# Patient Record
Sex: Female | Born: 1950
Health system: Southern US, Community
[De-identification: ages and names within clinical notes are randomized; demographics above are authoritative.]

## PROBLEM LIST (undated history)

## (undated) DIAGNOSIS — R51 Headache: Secondary | ICD-10-CM

## (undated) DIAGNOSIS — G4730 Sleep apnea, unspecified: Secondary | ICD-10-CM

## (undated) DIAGNOSIS — E049 Nontoxic goiter, unspecified: Secondary | ICD-10-CM

## (undated) DIAGNOSIS — M1990 Unspecified osteoarthritis, unspecified site: Secondary | ICD-10-CM

## (undated) DIAGNOSIS — F32A Depression, unspecified: Secondary | ICD-10-CM

## (undated) DIAGNOSIS — F329 Major depressive disorder, single episode, unspecified: Secondary | ICD-10-CM

## (undated) HISTORY — PX: THYROIDECTOMY: SHX17

## (undated) HISTORY — DX: Depression, unspecified: F32.A

## (undated) HISTORY — DX: Unspecified osteoarthritis, unspecified site: M19.90

## (undated) HISTORY — PX: ABDOMINAL HYSTERECTOMY: SHX81

## (undated) HISTORY — DX: Headache: R51

## (undated) HISTORY — DX: Sleep apnea, unspecified: G47.30

## (undated) HISTORY — DX: Major depressive disorder, single episode, unspecified: F32.9

## (undated) HISTORY — DX: Nontoxic goiter, unspecified: E04.9

## (undated) HISTORY — PX: KNEE SURGERY: SHX244

---

## 2002-10-23 ENCOUNTER — Encounter: Payer: Self-pay | Admitting: Family Medicine

## 2002-10-23 ENCOUNTER — Ambulatory Visit (HOSPITAL_COMMUNITY): Admission: RE | Admit: 2002-10-23 | Discharge: 2002-10-23 | Payer: Self-pay | Admitting: Family Medicine

## 2003-02-08 ENCOUNTER — Encounter: Payer: Self-pay | Admitting: Orthopaedic Surgery

## 2003-02-08 ENCOUNTER — Ambulatory Visit (HOSPITAL_COMMUNITY): Admission: RE | Admit: 2003-02-08 | Discharge: 2003-02-08 | Payer: Self-pay | Admitting: Orthopaedic Surgery

## 2003-07-09 ENCOUNTER — Encounter: Admission: RE | Admit: 2003-07-09 | Discharge: 2003-07-09 | Payer: Self-pay | Admitting: Internal Medicine

## 2005-08-05 ENCOUNTER — Emergency Department (HOSPITAL_COMMUNITY): Admission: EM | Admit: 2005-08-05 | Discharge: 2005-08-05 | Payer: Self-pay | Admitting: Emergency Medicine

## 2006-05-05 ENCOUNTER — Ambulatory Visit: Payer: Self-pay | Admitting: Internal Medicine

## 2006-06-20 ENCOUNTER — Ambulatory Visit: Payer: Self-pay | Admitting: Internal Medicine

## 2006-06-28 ENCOUNTER — Ambulatory Visit (HOSPITAL_COMMUNITY): Admission: RE | Admit: 2006-06-28 | Discharge: 2006-06-28 | Payer: Self-pay | Admitting: Internal Medicine

## 2006-10-10 ENCOUNTER — Ambulatory Visit: Payer: Self-pay | Admitting: Thoracic Surgery

## 2006-10-10 ENCOUNTER — Encounter (INDEPENDENT_AMBULATORY_CARE_PROVIDER_SITE_OTHER): Payer: Self-pay | Admitting: Specialist

## 2006-10-10 ENCOUNTER — Ambulatory Visit (HOSPITAL_COMMUNITY): Admission: RE | Admit: 2006-10-10 | Discharge: 2006-10-11 | Payer: Self-pay | Admitting: Surgery

## 2007-01-06 LAB — HM PAP SMEAR

## 2007-08-19 ENCOUNTER — Emergency Department (HOSPITAL_COMMUNITY): Admission: EM | Admit: 2007-08-19 | Discharge: 2007-08-19 | Payer: Self-pay | Admitting: Emergency Medicine

## 2008-01-06 LAB — HM MAMMOGRAPHY

## 2008-01-19 DIAGNOSIS — T7840XA Allergy, unspecified, initial encounter: Secondary | ICD-10-CM | POA: Insufficient documentation

## 2008-01-19 DIAGNOSIS — E049 Nontoxic goiter, unspecified: Secondary | ICD-10-CM | POA: Insufficient documentation

## 2008-01-19 DIAGNOSIS — J309 Allergic rhinitis, unspecified: Secondary | ICD-10-CM

## 2008-01-19 DIAGNOSIS — J45909 Unspecified asthma, uncomplicated: Secondary | ICD-10-CM | POA: Insufficient documentation

## 2008-01-22 ENCOUNTER — Ambulatory Visit: Payer: Self-pay | Admitting: Internal Medicine

## 2008-05-10 ENCOUNTER — Emergency Department (HOSPITAL_COMMUNITY): Admission: EM | Admit: 2008-05-10 | Discharge: 2008-05-10 | Payer: Self-pay | Admitting: Emergency Medicine

## 2009-06-07 LAB — HM COLONOSCOPY

## 2009-07-21 ENCOUNTER — Ambulatory Visit: Payer: Self-pay | Admitting: Internal Medicine

## 2009-07-21 DIAGNOSIS — G4700 Insomnia, unspecified: Secondary | ICD-10-CM | POA: Insufficient documentation

## 2009-08-13 ENCOUNTER — Emergency Department (HOSPITAL_COMMUNITY): Admission: EM | Admit: 2009-08-13 | Discharge: 2009-08-13 | Payer: Self-pay | Admitting: Emergency Medicine

## 2009-10-21 ENCOUNTER — Telehealth: Payer: Self-pay | Admitting: Internal Medicine

## 2009-10-23 ENCOUNTER — Ambulatory Visit: Payer: Self-pay | Admitting: Internal Medicine

## 2009-12-22 ENCOUNTER — Ambulatory Visit: Payer: Self-pay | Admitting: Internal Medicine

## 2010-04-27 ENCOUNTER — Ambulatory Visit: Payer: Self-pay | Admitting: Internal Medicine

## 2010-07-07 NOTE — Assessment & Plan Note (Signed)
Summary: 2 months/apc   Primary Provider/Referring Provider:  Esperanza Sheets [place]  CC:  2 month follow up visit-asthma and allergies; Breathing is better since last OV.Marland Kitchen  History of Present Illness:  60 yo woman with past hx allergic rhinitis and asthma. Previous surgery for large substernal goiter. Needs update of meds. Denies significant seasonal repiratory problems so far. Hot weather affects. Reviewed meds.  July 21, 2009- Asthma, Allergic rhinitis Had done well over last year or two. Starting in November noted more head congestion, some sneeze and nose blowing. Singulair continued, but she ran out. Has been off Advair and Flonase.  In the last week has had more nasal drainage. Chronic insomnia has been an issue. Sleep hygiene was reviewed.  Oct 23, 2009- Asthma, Allergic rhinitis, hx insomnia Went to ER in early April with sinus infection- not given antibiotic, told "viral". She continues Advair two times a day but doesn't feel it gets in well. Singulair does help. Using Proair partly when she smells "wood" odor. Denies mold or mildew in home. Feels she can't take deep breath, but not wheezing or chest pain, palpitation or swelling.  December 22, 2009- Asthma, Allergic rhinitis Symbicort did well and she used it regularly for about 6 weeks. She has drifted into sporadic use now.   Singulair helped in the Spring. Neti pot worked well.  She seems pleased now with her status. We distinguished benefit of meds from improved environment due to time of year.    Asthma History    Initial Asthma Severity Rating:    Age range: 12+ years    Symptoms: 0-2 days/week    Nighttime Awakenings: 0-2/month    Interferes w/ normal activity: no limitations    SABA use (not for EIB): 0-2 days/week    Asthma Severity Assessment: Intermittent   Preventive Screening-Counseling & Management  Alcohol-Tobacco     Smoking Status: never  Current Medications (verified): 1)  Synthroid 112 Mcg  Tabs  (Levothyroxine Sodium) .... Take 1 By Mouth Once Daily 2)  Singulair 10 Mg  Tabs (Montelukast Sodium) .... Take 1 By Mouth Once Daily 3)  Proair Hfa 108 (90 Base) Mcg/act  Aers (Albuterol Sulfate) .... 2 Puffs Four Times A Day As Needed 4)  Calcium Carbonate-Vitamin D 600-400 Mg-Unit  Tabs (Calcium Carbonate-Vitamin D) .... Take 1 Tablet By Mouth Two Times A Day 5)  Symbicort 80-4.5 Mcg/act Aero (Budesonide-Formoterol Fumarate) .... 2 Puffs Two Times A Day and Rinse Mouth Well After Use 6)  Meloxicam 7.5 Mg Tabs (Meloxicam) .... Take 1-2 By Mouth Once Daily As Needed 7)  Align  Caps (Probiotic Product) .... Take 1 By Mouth Once Daily 8)  Ambien 10 Mg Tabs (Zolpidem Tartrate) .... Take 1 By Mouth At Bedtime As Needed Sleep 9)  Aleve 220 Mg Tabs (Naproxen Sodium) .... Take As Directed Per Bottle As Needed 10)  Colace 100 Mg Caps (Docusate Sodium) .... Take As Directed Per Bottle As Needed 11)  Metamucil 0.52 Gm Caps (Psyllium) .... Take 6 By Mouth Once Daily To Two Times A Day  Allergies (verified): 1)  ! Septra 2)  ! 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   Physical Exam  Additional Exam:  General: A/Ox3; pleasant and cooperative, NAD, SKIN: no rash, lesions NODES: no lymphadenopathy HEENT: [place]/AT, EOM- WNL, Conjuctivae- clear, PERRLA, TM-WNL, Nose- mucus bridging, Throat- clear and wnl, raspy, Mallampati  II-III NECK: Supple w/ fair ROM, JVD- none, normal carotid impulses w/o bruits Thyroid- normal to palpation CHEST: Clear to P&A, coarse without wheeze or cough, shallow without dullness, rub. HEART: RRR, no m/g/r heard ABDOMEN: Soft and nl; ZOX:WRUE, nl pulses, no edema  NEURO: Grossly intact to observation      Impression & Recommendations:  Problem # 1:  ASTHMA (ICD-493.90) Mild intermittent now. We spent time reviewing rescue vs maintenance meds.  Problem # 2:  ALLERGIC RHINITIS (ICD-477.9)  We reviewed her experience with antihistamines. She had preferred Benadryl, but seems not to have understoo the differences. Control now is gpod. The following medications were removed from the medication list:    Fluticasone Propionate 50 Mcg/act Susp (Fluticasone propionate) .Marland Kitchen... 1-2 puffs each nostril daily    Zyrtec Hives Relief 10 Mg Tabs (Cetirizine hcl) .Marland Kitchen... Take 1 by mouth once daily  Medications Added to Medication List This Visit: 1)  Symbicort 80-4.5 Mcg/act Aero (Budesonide-formoterol fumarate) .... 2 puffs two times a day and rinse mouth well after use 2)  Ambien 10 Mg Tabs (Zolpidem tartrate) .... Take 1 by mouth at bedtime as needed sleep 3)  Aleve 220 Mg Tabs (Naproxen sodium) .... Take as directed per bottle as needed 4)  Colace 100 Mg Caps (Docusate sodium) .... Take as directed per bottle as needed 5)  Metamucil 0.52 Gm Caps (Psyllium) .... Take 6 by mouth once daily to two times a day  Other Orders: Est. Patient Level III (45409)  Patient Instructions: 1)  Please schedule a follow-up appointment in 4 months. 2)  Use the Symbicort as a maintenance  controller if you are noting some wheeze, tightness or shortness of breath frequently- like several days per week. 2 puffs, once daily, may be sufficient in stable times. 3)  Use the Proair rescue inhaler for occasional quick fix if needed- you can use it up to 4 x daily if needed. 4)  Your Neti pot or saline nasal rinse can be used if needed.

## 2010-07-07 NOTE — Assessment & Plan Note (Signed)
Summary: Brooke Schneider   Primary Provider/Referring Provider:  Esperanza Sheets [facility]  CC:  Follow up visit-allergies and asthma; "can't breathe deep" feelings. .  History of Present Illness: 01/22/08- GOITER, SUBSTERNAL (ICD-240.9) ASTHMA (ICD-493.90) ALLERGIC RHINITIS (ICD-477.9)  60 yo woman with past hx allergic rhinitis and asthma. Previous surgery for large substernal goiter. Needs update of meds. Denies significant seasonal repiratory problems so far. Hot weather affects. Reviewed meds.  July 21, 2009- Asthma, Allergic rhinitis Had done well over last year or two. Starting in November noted more head congestion, some sneeze and nose blowing. Singulair continued, but she ran out. Has been off Advair and Flonase.  In the last week has had more nasal drainage. Chronic insomnia has been an issue. Sleep hygiene was reviewed.  Oct 23, 2009- Asthma, Allergic rhinitis, hx insomnia Went to ER in early April with sinus infection- not given antibiotic, told "viral". She continues Advair two times a day but doesn't feel it gets in well. Singulair does help. Using Proair partly when she smells "wood" odor. Denies mold or mildew in home. Feels she can't take deep breath, but not wheezing or chest pain, palpitation or swelling.      Current Medications (verified): 1)  Synthroid 112 Mcg  Tabs (Levothyroxine Sodium) .... Take 1 By Mouth Once Daily 2)  Singulair 10 Mg  Tabs (Montelukast Sodium) .... Take 1 By Mouth Once Daily 3)  Proair Hfa 108 (90 Base) Mcg/act  Aers (Albuterol Sulfate) .... 2 Puffs Four Times A Day As Needed 4)  Glucosamine-Chondroitin   Caps (Glucosamine-Chondroit-Vit C-Mn) .... 3 Caps By Mouth Once Daily 5)  Calcium Carbonate-Vitamin D 600-400 Mg-Unit  Tabs (Calcium Carbonate-Vitamin D) .... Take 1 Tablet By Mouth Two Times A Day 6)  Fluticasone Propionate 50 Mcg/act  Susp (Fluticasone Propionate) .Marland Kitchen.. 1-2 Puffs Each Nostril Daily 7)  Advair Diskus 100-50 Mcg/dose  Misc  (Fluticasone-Salmeterol) .Marland Kitchen.. 1 Puff and Riinse, Twice Daily 8)  Meloxicam 7.5 Mg Tabs (Meloxicam) .... Take 1-2 By Mouth Once Daily As Needed 9)  Align  Caps (Probiotic Product) .... Take 1 By Mouth Once Daily 10)  Zyrtec Hives Relief 10 Mg Tabs (Cetirizine Hcl) .... Take 1 By Mouth Once Daily 11)  Trazodone Hcl 50 Mg Tabs (Trazodone Hcl) .... Take 1-2 By Mouth At Bedtime As Needed Sleep  Allergies (verified): 1)  ! Septra 2)  ! 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  The patient denies shortness of breath at rest, productive cough, non-productive cough, coughing up blood, chest pain, irregular heartbeats, acid heartburn, indigestion, loss of appetite, weight change, abdominal pain, difficulty swallowing, sore throat, tooth/dental problems, headaches, nasal congestion/difficulty breathing through nose, and sneezing.    Vital Signs:  Patient profile:   60 year old female Height:      66 inches Weight:      213 pounds BMI:     34.50 O2 Sat:      100 % on Room air Pulse rate:  78 / minute BP sitting:   142 / 82  (left arm) Cuff size:   large  Vitals Entered By: Reynaldo Minium CMA (Oct 23, 2009 11:04 AM)  O2 Flow:  Room air  Physical Exam  Additional Exam:  General: A/Ox3; pleasant and cooperative, NAD, SKIN: no rash, lesions NODES: no lymphadenopathy HEENT: [place]/AT, EOM- WNL, Conjuctivae- clear, PERRLA, TM-WNL,  Nose- mucus bridging, Throat- clear and wnl, raspy, Mallampati  II-III NECK: Supple w/ fair ROM, JVD- none, normal carotid impulses w/o bruits Thyroid- normal to palpation CHEST: Clear to P&A, coarse without wheeze or cough, shallow without dullness, rub. HEART: RRR, no m/g/r heard ABDOMEN: Soft and nl; XKG:YJEH, nl pulses, no edema  NEURO: Grossly intact to observation      Impression & Recommendations:  Problem # 1:  ALLERGIC RHINITIS (ICD-477.9)  I will instruct in use of Neti pot. She is going to get her house cleaned.  Her updated medication list for this problem includes:    Fluticasone Propionate 50 Mcg/act Susp (Fluticasone propionate) .Marland Kitchen... 1-2 puffs each nostril daily    Zyrtec Hives Relief 10 Mg Tabs (Cetirizine hcl) .Marland Kitchen... Take 1 by mouth once daily  Problem # 2:  ASTHMA (ICD-493.90) Not wheezing. Nonspecific dyspnea. We will get CXR and PFT. Question obesisy/ hypoventilation.  Medications Added to Medication List This Visit: 1)  Calcium Carbonate-vitamin D 600-400 Mg-unit Tabs (Calcium carbonate-vitamin d) .... Take 1 tablet by mouth two times a day 2)  Meloxicam 7.5 Mg Tabs (Meloxicam) .... Take 1-2 by mouth once daily as needed 3)  Align Caps (Probiotic product) .... Take 1 by mouth once daily 4)  Zyrtec Hives Relief 10 Mg Tabs (Cetirizine hcl) .... Take 1 by mouth once daily 5)  Trazodone Hcl 50 Mg Tabs (Trazodone hcl) .... Take 1-2 by mouth at bedtime as needed sleep  Other Orders: Est. Patient Level III (63149) T-2 View CXR (71020TC)  Patient Instructions: 1)  Please schedule a follow-up appointment in 2 months. 2)  Schedule PFT 3)  A chest x-ray has been recommended.  Your imaging study may require preauthorization.  4)  Try sample Symbicort 80/4.5, 2 puffs and rinse mouth twice daily, instead of Advair for trial. If you like it better, then call, for script.

## 2010-07-07 NOTE — Assessment & Plan Note (Signed)
Summary: f/u ///kp   Primary Provider/Referring Provider:  Esperanza Sheets [place]  CC:  , 4 month follow up, pt unable to sleep out of ambien x 1 week, and .  History of Present Illness:  Oct 23, 2009- Asthma, Allergic rhinitis, hx insomnia Went to ER in early April with sinus infection- not given antibiotic, told "viral". She continues Advair two times a day but doesn't feel it gets in well. Singulair does help. Using Proair partly when she smells "wood" odor. Denies mold or mildew in home. Feels she can't take deep breath, but not wheezing or chest pain, palpitation or swelling.  December 22, 2009- Asthma, Allergic rhinitis Symbicort did well and she used it regularly for about 6 weeks. She has drifted into sporadic use now.   Singulair helped in the Spring. Neti pot worked well.  She seems pleased now with her status. We distinguished benefit of meds from improved environment due to time of year.  April 27, 2010- Asthma, Allergic rhinitis, Insomnia Nurse-CC: ,4 month follow up, pt unable to sleep out of ambien x 1 week, Shows me tooth that came out  anticipating she may need an implant procedure.  She lost sleep- out of [place]. Menopause is aggravating her insomnia.  Lungs feel "weak, flat"- not cough or wheeze. When like this she will use her rescue inhaler more often for 2-3 days and it resolves. She has been out of Symbicort and doesn't feel she needs it.  Needs flu vax.     Asthma History    Asthma Control Assessment:    Age range: 12+ years    Symptoms: 0-2 days/week    Nighttime Awakenings: 0-2/month    Interferes w/ normal activity: no limitations    SABA use (not for EIB): 0-2 days/week    Asthma Control Assessment: Well Controlled   Preventive Screening-Counseling & Management  Alcohol-Tobacco     Smoking Status: never  Current Medications (verified): 1)  Synthroid 112 Mcg  Tabs (Levothyroxine Sodium) .... Take 1 By Mouth Once Daily 2)  Singulair 10 Mg  Tabs  (Montelukast Sodium) .... Take 1 By Mouth Once Daily 3)  Proair Hfa 108 (90 Base) Mcg/act  Aers (Albuterol Sulfate) .... 2 Puffs Four Times A Day As Needed 4)  Calcium Carbonate-Vitamin D 600-400 Mg-Unit  Tabs (Calcium Carbonate-Vitamin D) .... Take 1 Tablet By Mouth Two Times A Day 5)  Meloxicam 7.5 Mg Tabs (Meloxicam) .... Take 1-2 By Mouth Once Daily As Needed 6)  Align  Caps (Probiotic Product) .... Take 1 By Mouth Once Daily 7)  Aleve 220 Mg Tabs (Naproxen Sodium) .... Take As Directed Per Bottle As Needed 8)  Colace 100 Mg Caps (Docusate Sodium) .... Take As Directed Per Bottle As Needed 9)  Metamucil 0.52 Gm Caps (Psyllium) .... Take 6 By Mouth Once Daily To Two Times A Day  Allergies (verified): 1)  ! Septra 2)  ! 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    Vital Signs:  Patient profile:   60 year old female Height:      66 inches Weight:      210 pounds O2 Sat:      95 % on Room air Pulse rate:   70 / minute BP sitting:   138 / 88   (left arm)  Vitals Entered By: Renold Genta RCP, LPN (April 27, 2010 11:35 AM)  O2 Flow:  Room air CC: ,4 month follow up, pt unable to sleep out of ambien x 1 week, Comments Medications reviewed with patient Renold Genta RCP, LPN  April 27, 2010 11:37 AM    Physical Exam  Additional Exam:  General: A/Ox3; pleasant and cooperative, NAD, SKIN: no rash, lesions NODES: no lymphadenopathy HEENT: [place]/AT, EOM- WNL, Conjuctivae- clear, PERRLA, TM-WNL, Nose- mucus bridging, Throat- clear and wnl, raspy, Mallampati  II-III NECK: Supple w/ fair ROM, JVD- none, normal carotid impulses w/o bruits Thyroid- normal to palpation CHEST: Clear to P&A, coarse without wheeze or cough, shallow without dullness, rub. HEART: RRR, no m/g/r heard ABDOMEN: Soft and nl; MWU:XLKG, nl pulses, no edema  NEURO: Grossly intact to observation      Impression & Recommendations:  Problem # 1:  ASTHMA (ICD-493.90) She is doing very well. We discussed passive smoke exposure from husband and son. Rescue inhaler seems sufficient, but we will watch pattern as the seasons progress. Flu shot discussed.  No stridor to relate to her known substernal goiter. Flu vax discussion.  Problem # 2:  ALLERGIC RHINITIS (ICD-477.9)  Seasonal issue is less intrusive now and she has been trying off antihistamines to see how she does.   Problem # 3:  INSOMNIA (ICD-780.52)  We discussed management and menopause. Ambien refilled.  Her updated medication list for this problem includes:    Ambien 10 Mg Tabs (Zolpidem tartrate) .Marland Kitchen... Take 1 by mouth at bedtime as needed sleep  Orders: Est. Patient Level IV (40102)  Other Orders: Admin 1st Vaccine (72536) Flu Vaccine 38yrs + (64403)  Patient Instructions: 1)  Please schedule a follow-up appointment in 6 months. 2)  Flu shot 3)  Script refill ambien Prescriptions: AMBIEN 10 MG TABS (ZOLPIDEM TARTRATE) take 1 by mouth at bedtime as needed sleep  #30 x prn    Entered and Authorized by:   Waymon Budge MD   Signed by:   Waymon Budge MD on 04/27/2010   Method used:   Print then Give to Patient   RxID:   [id number]       Flu Vaccine Consent Questions     Do you have a history of severe allergic reactions to this vaccine? no    Any prior history of allergic reactions to egg and/or gelatin? no    Do you have a sensitivity to the preservative Thimersol? no    Do you have a past history of Guillan-Barre Syndrome? no    Do you currently have an acute febrile illness? no    Have you ever had a severe reaction to latex? no    Vaccine information given and explained to patient? yes    Are you currently pregnant? no    Lot Number:[id number]   Exp Date:12/05/2010   Site Given  Left Deltoid  [id number] Armita Galloway RCP, LPN  April 27, 2010 12:23 PM

## 2010-07-07 NOTE — Assessment & Plan Note (Signed)
Summary: rov/apc   Primary Provider/Referring Provider:  Esperanza [facility] [place]  CC:  Follow up visit-cough-nonprod; sore throat and sinus aches x 1 week. Marland Kitchen  History of Present Illness: 01/22/08- GOITER, SUBSTERNAL (ICD-240.9) ASTHMA (ICD-493.90) ALLERGIC RHINITIS (ICD-477.9)  60 yo woman with past hx allergic rhinitis and asthma. Previous surgery for large substernal goiter. Needs update of meds. Denies significant seasonal repiratory problems so far. Hot weather affects. Reviewed meds.  July 21, 2009- Asthma, Allergic rhinitis Had done well over last year or two. Starting in November noted more head congestion, some sneeze and nose blowing. Singulair continued, but she ran out. Has been off Advair and Flonase.  In the last week has had more nasal drainage. Chronic insomnia has been an issue. Sleep hygiene was reviewed.       Current Medications (verified): 1)  Synthroid 112 Mcg  Tabs (Levothyroxine Sodium) .... Take 1 By Mouth Once Daily 2)  Singulair 10 Mg  Tabs (Montelukast Sodium) .... Take 1 By Mouth Once Daily 3)  Ambien 10 Mg  Tabs (Zolpidem Tartrate) .... Take 1 By Mouth At Bedtime 4)  Proair Hfa 108 (90 Base) Mcg/act  Aers (Albuterol Sulfate) .... 2 Puffs Four Times A Day As Needed 5)  Glucosamine-Chondroitin   Caps (Glucosamine-Chondroit-Vit C-Mn) .... 3 Caps By Mouth Once Daily 6)  Calcium Carbonate-Vitamin D 600-400 Mg-Unit  Tabs (Calcium Carbonate-Vitamin D) .... Take 1 Tablet By Mouth Once A Day 7)  Fluticasone Propionate 50 Mcg/act  Susp (Fluticasone Propionate) .Marland Kitchen.. 1-2 Puffs Each Nostril Daily 8)  Advair Diskus 100-50 Mcg/dose  Misc (Fluticasone-Salmeterol) .Marland Kitchen.. 1 Puff and Riinse, Twice Daily  Allergies (verified): 1)  ! Septra 2)  ! 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    Vital Signs:  Patient profile:   60 year old female Height:      66 inches Weight:      206.25 pounds BMI:     33.41 O2 Sat:      96 % on Room air Pulse rate:   67 / minute BP sitting:   124 / 78  (left arm) Cuff size:   regular  Vitals Entered By: Reynaldo Minium CMA (July 21, 2009 10:49 AM)  O2 Flow:  Room air  Physical Exam  Additional Exam:  General: A/Ox3; pleasant and cooperative, NAD, SKIN: no rash, lesions NODES: no lymphadenopathy HEENT: [place]/AT, EOM- WNL, Conjuctivae- clear, PERRLA, TM-WNL, Nose- mucus bridging, Throat- clear and wnl, raspy, Mellampatti  II-III NECK: Supple w/ fair ROM, JVD- none, normal carotid impulses w/o bruits Thyroid- normal to palpation CHEST: Clear to P&A, coarse without wheeze or cough HEART: RRR, no m/g/r heard ABDOMEN: Soft and nl; GBT:DVVO, nl pulses, no edema  NEURO: Grossly intact to observation      Impression & Recommendations:  Problem # 1:  ASTHMA (ICD-493.90) We need to restock her meds. If these are insufficient we will reconsider.  Problem # 2:  ALLERGIC RHINITIS (ICD-477.9) Increased head congestion, including stuffiness and some drainage. No obvious sinusitis. Her updated medication list for this problem includes:    Fluticasone Propionate 50 Mcg/act Susp (Fluticasone propionate) .Marland Kitchen... 1-2 puffs each nostril daily  Problem # 3:  INSOMNIA (ICD-780.52)  She complains of difficulty  maintaining sleep and pints to her nose. Her primary gave trazodone. I suggested resuming flonase, then trying benadryl for sleep. She will have her husband pay more attention for snoring., Her updated medication list for this problem includes:    Ambien 10 Mg Tabs (Zolpidem tartrate) .Marland Kitchen... Take 1 by mouth at bedtime  Orders: Est. Patient Level III ([address])  Patient Instructions: 1)  Please schedule a follow-up appointment in 3 months. 2)  Resume regular meds. 3)  Try benadryl at night as a sleep aide. 4)  Ask your husband to notice snoring, stopping breathing and leg jerks while you sleep. 5)  Most people sleep best in a cool, dark quiet bedroom. You may breathe better sleeping on your side. Breath Right nasal strips help some people. Prescriptions: ADVAIR DISKUS 100-50 MCG/DOSE  MISC (FLUTICASONE-SALMETEROL) 1 puff and riinse, twice daily  #1 x prn   Entered and Authorized by:   Waymon Budge MD   Signed by:   Waymon Budge MD on 07/21/2009   Method used:   Electronically to        [facility]* (retail)       [address]       [place], [place]  [id number]       Ph: [phone]       Fax: [phone]   RxID:   [id number] FLUTICASONE PROPIONATE 50 MCG/ACT  SUSP (FLUTICASONE PROPIONATE) 1-2 puffs each nostril daily  #1 x prn   Entered and Authorized by:   Waymon Budge MD   Signed by:   Waymon Budge MD on 07/21/2009   Method used:   Electronically to        [facility]* (retail)       [address]       [place], [place]  [id number]       Ph: [phone]       Fax: [phone]   RxID:   [id number] PROAIR HFA 108 (90 BASE) MCG/ACT  AERS (ALBUTEROL SULFATE) 2 puffs four times a day as needed  #1 x prn   Entered and Authorized by:   Waymon Budge MD   Signed by:   Waymon Budge MD on 07/21/2009   Method used:   Electronically to        [facility]* (retail)       [address]       [place], [place]   [id number]       Ph: [phone]       Fax: [phone]   RxID:   [id number] SINGULAIR 10 MG  TABS (MONTELUKAST SODIUM) take 1 by mouth once daily  #30 x prn   Entered and Authorized by:   Waymon Budge MD   Signed by:   Waymon Budge MD on 07/21/2009   Method used:   Electronically to        [facility]* (retail)       [address]       [place], [place]  [id number]       Ph: [phone]       Fax: [phone]   RxID:  [id number]    

## 2010-07-07 NOTE — Progress Notes (Signed)
Summary: nos appt  Phone Note Call from Patient   Caller: [email or web address]  Call For: Brooke Schneider Summary of Call: Rsc nos from 5/16 to 5/19 @ 10:45a. Initial call taken by: Darletta Moll,  Oct 21, 2009 10:00 AM

## 2010-09-03 ENCOUNTER — Other Ambulatory Visit: Payer: Self-pay | Admitting: *Deleted

## 2010-09-03 MED ORDER — ALBUTEROL SULFATE HFA 108 (90 BASE) MCG/ACT IN AERS: 2.0000 | INHALATION_SPRAY | Freq: Four times a day (QID) | RESPIRATORY_TRACT | Status: DC | PRN

## 2010-09-25 ENCOUNTER — Other Ambulatory Visit: Payer: Self-pay | Admitting: *Deleted

## 2010-09-25 NOTE — Telephone Encounter (Signed)
Spoke w/ [employer] to initiate prior authorization for pt Singulair 10 mg. Starting today 09/25/10 w/ lifetime coverage under plan. Pt has unlimited refills and copay of $12 x 30 day supply. Authorization #: [id number]. I spoke w/ Harriett Sine B. Called and spoke w/ [employer] and advised them of the approval. They made sure rx went through and it did and they state they will call pt and inform them of the approval. Nothing further was needed

## 2010-10-22 ENCOUNTER — Encounter: Payer: Self-pay | Admitting: Internal Medicine

## 2010-10-23 NOTE — Op Note (Signed)
Brooke Schneider, Brooke Schneider              ACCOUNT NO.:  [id number]   MEDICAL RECORD NO.:  [id number]          PATIENT TYPE:  AMB   LOCATION:  SDS                          FACILITY:  [facility]   PHYSICIAN:  Velora Heckler, MD      DATE OF BIRTH:  1950-12-16   DATE OF PROCEDURE:  10/10/2006  DATE OF DISCHARGE:                               OPERATIVE REPORT   PREOPERATIVE DIAGNOSIS:  Substernal thyroid goiter.   POSTOPERATIVE DIAGNOSIS:  Substernal thyroid goiter.   PROCEDURE:  1. Resection of right substernal thyroid goiter.  2. Completion thyroidectomy.   SURGEON:  Velora Heckler, M.D., FACS   ASSISTANT:  Ines Bloomer, M.D., FACS   ANESTHESIA:  General.   ANESTHESIOLOGIST:  Guadalupe Maple, M.D.   ESTIMATED BLOOD LOSS:  100 mL.   PREPARATION:  Betadine.   COMPLICATIONS:  Small tear, right internal jugular vein repaired with  pledgets by Dr. Dewayne Shorter.   HISTORY OF PRESENT ILLNESS:  The patient is a 60 year old black female  from [place], [place].  She underwent partial thyroidectomy  and subsequent radioactive iodine treatment for unknown etiology in 1984  at [facility].  The patient has developed a large right  superior mediastinal mass consistent with substernal goiter.  It  measures 5.5 cm and extends to the aortic arch.  The patient now comes  to surgery for resection and completion thyroidectomy.   DESCRIPTION OF PROCEDURE:  The procedure is done in OR #7 at the [facility].  The patient is brought to the operating room  and placed in a supine position on the operating room table.  Following  administration of general anesthesia, the patient is prepped and draped  in the usual strict aseptic fashion.   After ascertaining that an adequate level of anesthesia had been  obtained, the patient's previous Kocher incision is reopened with a #10  blade.  Dissection is carried down through the subcutaneous tissues and  hemostasis  obtained with the electrocautery.  The platysma is divided.  The external jugular veins are divided between hemostats and ligated  with 2-0 silk ties.  Subplatysmal flaps are elevated cephalad and caudad  from the thyroid notch to the sternal notch.  A Mahorner self-retaining  retractor is placed for exposure.  The strap muscles are incised in the  midline.  Dissection is begun in the left neck, as there is a palpable  remnant of thyroid tissue in the left thyroid bed.  This is exposed with  some difficulty, as there is a large amount of scar tissue in the left  neck.  The remnant appears to be multinodular, possibly with a cystic  component.  It is gently dissected out.  The parathyroid tissue was  identified and preserved.  The recurrent nerve is not identified.  Vascular structures are divided between small Ligaclips.  The residual  tissue is then excised off the trachea with the electrocautery and the  harmonic scalpel used for hemostasis.  This tissue is submitted to  pathology for review.  A dry pack is placed in  the left neck.   Next, we turned our attention to the right side of the neck.  Palpation  of the right thyroid bed shows no residual thyroid tissue.  However,  there is a large mass emanating from just beneath the right clavicle.  The strap muscles are mobilized.  There are actually split  longitudinally and the mass is exposed.  This is a large substernal  goiter which appears quite vascular.  Dissection is performed along the  capsule.  With gentle blunt dissection, the mass is mobilized in the  anterior mediastinum.  The vascular structures which are visualized are  divided with the harmonic scalpel.  A large venous tributary to the  superior portion of the mass is divided between medium Ligaclips with  the harmonic scalpel.  The gland is gently mobilized with blunt digital  dissection and delivered up and out of the wound.  It appears to contain  colloid.  The entire  mass is excised.  Moderate venous bleeding is  encountered.  With retraction and use of the suction, the injury is  noted to the right internal jugular vein.  This appears to be about a 3-  mm tear where likely a small venous tributary was evulsed with removal  of the mass.  Dr. Dewayne Shorter repaired this tear with 5-0 Prolene and  pledgets, with excellent hemostasis.  The mediastinum is then irrigated  with warm saline which is evacuated.  All residual thyroid tissue is  removed.  The pleura of the right lung is visible.  Surgicel is placed  in the operative field.  Surgicel is also placed in the left thyroid  bed.  Good hemostasis is noted throughout.  The strap muscles are then  reapproximated in the midline with interrupted 3-0 Vicryl sutures.  The  platysma is closed with interrupted 3-0 Vicryl sutures.  The skin is  closed with running 4-0 Vicryl subcuticular suture.  The wound is washed  and dried, and Benzoin and Steri-Strips are applied.  Sterile dressings  are applied.   The patient is awakened from anesthesia and brought to the recovery room  in stable condition.  The patient tolerated the procedure well.      Velora Heckler, MD  Electronically Signed     TMG/MEDQ  D:  10/10/2006  T:  10/10/2006  Job:  [id number]   cc:   Ines Bloomer, M.D.  Ernestina Penna, M.D.  Clinton D. Maple Hudson, MD, FCCP, FACP

## 2010-10-23 NOTE — Assessment & Plan Note (Signed)
[facility]                             PULMONARY OFFICE NOTE   Brooke Schneider, Brooke Schneider                     MRN:          [id number]  DATE:06/20/2006                            DOB:          1951-04-22    PROBLEM LIST:  1. Mediastinal mass/substernal goiter.  2. Asthma.  3. Allergic rhinitis.   HISTORY:  She had a thoracic surgical evaluation and is being referred  on to a thyroid surgeon. She does not know the names of these doctors.  Occasionally has a sense of pressure or fullness in the chest. She  elevates her head to sleep but said she breaths well lying on a flat  hardwood floor. We discussed positional effects on pressure and the  position of tissue goiter. She denies food hang up, cough or wheeze. She  thinks Singulair helps some and she does use her rescue inhaler  occasionally. She continues allergy vaccine through Dr. Willa Rough.   MEDICATIONS:  1. Synthroid 0.112.  2. Xanax.  3. Singulair.  4. Ambien 10 mg.  5. Zyrtec.  6. Zantac.  7. Asmanex.  8. Albuterol inhaler.   Drug intolerant of SEPTRA and FLAGYL.   OBJECTIVE:  Weight 207 pounds, blood pressure 120/70, pulse regular 77.  Room air saturation 97%. There is no stridor or neck vein distention. I  do not find adenopathy at the neck or axillae. Chest is clear  posteriorly, breathing is unlabored. Heart sounds are regular without  murmur or gallop. Voice quality of normal and she seems comfortable.   IMPRESSION:  Mediastinal mass consistent with a large goiter. She had  previously had one lobe of her thyroid resected for the same problem. I  think she is currently stable if she plans surgery soon.   PLAN:  1. We are scheduling pulmonary function tests.  2. Flu vaccine.  3. She had her pneumococcal vaccine in November 2005.  4. Schedule return in 3 months, earlier p.r.n.     Clinton D. Maple Hudson, MD, Tonny Bollman, FACP  Electronically Signed    CDY/MedQ  DD: 06/20/2006  DT:  06/21/2006  Job #: [id number]   cc:   Ernestina Penna, M.D.

## 2010-10-23 NOTE — Assessment & Plan Note (Signed)
[facility]                             PULMONARY OFFICE NOTE   Brooke Schneider, Brooke Schneider                     MRN:          [id number]  DATE:05/05/2006                            DOB:          August 13, 1950    PROBLEM:  Pulmonary consultation at the kind request of Dr. Christell Constant for  this 60 year old woman with a mediastinal mass on chest CT.   HISTORY:  She has a history of partial thyroidectomy in 1984 and  subsequent treatment with radioactive iodine at [facility].  In the  past year she has had some sense of pressure fullness in the lower neck.  Dr. Christell Constant did a chest x-ray leading to a chest CT done at [facility]  on January 10, 2006, which showed a right superior mediastinal mass with  imaging features favoring goiter, 5.5 x 4.2 cm transverse diameter, in  apparent continuity with the right lobe of the thyroid extending into  the superior mediastinum with mass effect and leftward deviation of the  trachea, posterior displacement of the great vessels.  Incidental note  of a 7-mm plural-based nodule in the right middle lobe and a 2- to 3-mm  nodule in the lateral left upper lobe; otherwise, lungs clear.  A  subsequent technetium thyroid scan at [facility] on March 08, 2006, showed an enlarged, heterogenous right thyroid lobe without  clearly demarcated focal photopenic or hyperactive nodules.  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  While  pregnancy in 1997 or 1998 they were in [place] and at that time her  husband told her that her breathing at night sounded like Darth Vader  but that the loud upper respiratory sleep-associated noise improved  after childbirth.  She is not aware of any similar pattern now.   MEDICATION:  1. Synthroid 0.112 mcg.  2. Singulair.  3. Ambien 10 mg at h.s.  4. Zyrtec.  5. Zantac OTC.  6. Asmanex.  7. Allergy vaccine.  8. Rescue inhaler.   DRUG INTOLERANCE:  Of SEPTRA and of  FLAGYL.   REVIEW OF SYSTEMS:  Dyspnea, indigestion, difficulty swallowing, sore  throats, headaches, nasal congestion, itching, joint stiffness, rashes,  darkened sputum, occasional mild wheeze.  Her breathing does not wake  her at night.  She denies chest pain or palpitation and denies any  significant dysphagia.  Much postnasal drip and she says now she has a  cold.   PAST HISTORY:  1. Asthma.  2. Allergic rhinitis.  3. Sleep apnea.  4. Colon polyp removal.  5. Hysterectomy.  6. Partial thyroidectomy with radioactive iodine.  7. Knee surgery with arthroscopy.   She describes an emergency room visit in early 2007 for wheeze and  malaise and what sounds like an asthmatic bronchitis viral syndrome at  the time.   SOCIAL HISTORY:  Never smoked.  Married.  Housewife.  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  Her husband works in the [employer], currently at [place], [place].  They have traveled quite a bit around the world.  In the last month she  has been  in [place] and in [place].   FAMILY HISTORY:  Mother and sister with allergies.  Father with cancer.   OBJECTIVE:  VITAL SIGNS:  Weight 206 pounds, BP 126/82, pulse regular at  91, room air saturation 98%.  GENERAL:  Pleasantly comfortable, somewhat overweight African-American  woman in no distress.  SKIN:  No rash noted.  ADENOPATHY:  None at the neck, shoulders or axillae.  HEENT:  There is slightly yellow mucoid postnasal drainage without  significant nasal congestion or periorbital edema.  Her pharynx is not  red.  There is no neck vein distention or stridor.  She has a somewhat  prominent right sternoclavicular joint but I could not appreciate an  enlarged thyroid today.  HEART SOUNDS:  Regular without murmur or gallop.  LUNG FIELDS:  Clear.  EXTREMITIES:  There was no tremor, cyanosis, clubbing, or edema.   IMPRESSION:  1. Mediastinal mass most consistent with a substernal thyroid but      large on my review of her CT and  appropriate for consideration of      surgical resection.  Malignancy is less likely.  2. Mild asthma and allergic rhinitis by history, managed elsewhere on      allergy vaccine.  3. Incidental coryza.   PLAN:  1. I discussed options and we are referring her to cardiovascular and      thoracic surgery for consideration of resection.  2. She requests an albuterol inhaler to have on-hand and a      prescription is provided at two puffs q.i.d. p.r.n.  3. Schedule return with me p.r.n.  4. She will follow up with Dr. Vernon Prey for primary care and with Dr.      Ermalene Searing in [facility] for endocrinology.   I appreciate the chance to see her.     Clinton D. Maple Hudson, MD, Tonny Bollman, FACP  Electronically Signed    CDY/MedQ  DD: 05/05/2006  DT: 05/06/2006  Job #: [id number]   cc:   Ernestina Penna, M.D.  [employer]

## 2010-10-28 ENCOUNTER — Ambulatory Visit: Payer: Self-pay | Admitting: Internal Medicine

## 2010-11-13 ENCOUNTER — Other Ambulatory Visit: Payer: Self-pay | Admitting: Internal Medicine

## 2010-12-03 ENCOUNTER — Ambulatory Visit: Payer: Self-pay | Admitting: Internal Medicine

## 2010-12-21 ENCOUNTER — Ambulatory Visit (INDEPENDENT_AMBULATORY_CARE_PROVIDER_SITE_OTHER): Admitting: Internal Medicine

## 2010-12-21 ENCOUNTER — Encounter: Payer: Self-pay | Admitting: Internal Medicine

## 2010-12-21 ENCOUNTER — Telehealth: Payer: Self-pay | Admitting: Internal Medicine

## 2010-12-21 VITALS — BP 122/84 | HR 81 | Ht 66.0 in | Wt 214.2 lb

## 2010-12-21 DIAGNOSIS — J309 Allergic rhinitis, unspecified: Secondary | ICD-10-CM

## 2010-12-21 DIAGNOSIS — J45909 Unspecified asthma, uncomplicated: Secondary | ICD-10-CM

## 2010-12-21 DIAGNOSIS — G4700 Insomnia, unspecified: Secondary | ICD-10-CM

## 2010-12-21 NOTE — Assessment & Plan Note (Addendum)
Adequate control. I agree with her PCP that her headaches sound more like tension. She is suggesting need for an occasional daytime anxiolytic and I directed her to Dr Tanya Nones.  She can use an occasional otc antihistamine or decongestant, or saline rinse/ Neti pot as discussed.

## 2010-12-21 NOTE — Patient Instructions (Signed)
Sample Qvar 40    2 puffs and rinse mouth, twice every day.    Leave it on your bathroom sink so you can swish water and brush your teeth after using it. By the time the sample is used up, you may notice that you have begun breathing easier, with less need for your rescue inhaler. We can call in a script if helpful.

## 2010-12-21 NOTE — Telephone Encounter (Signed)
Pt seen in office 7.16.12 by CDY.  Called spoke with patient, verified medication name, dosage and instructions.  Will sign and forward to CDY as FYI.

## 2010-12-21 NOTE — Assessment & Plan Note (Signed)
Mild wheeze which she doesn't feel. We discussed rescue vs maintenance meds. We will have her try a steroid inhaler for long enough to see what she can tell.

## 2010-12-21 NOTE — Progress Notes (Signed)
Subjective:    Patient ID: Brooke Schneider, female    DOB: 04-22-1951, 60 y.o.   MRN: [id number]  HPI 12/21/10- 60 yoF passive smoker followed for allergic rhinitis, asthma, complicated by Insomnia. Last here April 27, 2010- note reviewed. She was having more insomnia when she had run out of [place]. We discussed Remus Loffler - she is not having sleepwalk problems. She admits tension build-ups and feeling she needs to have some help relaxing stress and tension during the day. Her PCP, Dr Tanya Nones, is refilling her [place]. She is taking a med for headaches.  Her husband still smokes occasional cigar outside of house. The smell on him tightens her. She will use claritin, rescue inhaler used 1-2x/ week. Thinks Singulair helps.  Review of Systems Constitutional:   No-   weight loss, night sweats, fevers, chills, fatigue, lassitude. HEENT:   No-  difficulty swallowing, tooth/dental problems, sore throat,            Occasional frontal headaches                 No-   sneezing, itching, ear ache, nasal congestion, post nasal drip,   CV:  No-   chest pain, orthopnea, PND, swelling in lower extremities, anasarca, dizziness, palpitations  GI:  No-   heartburn, indigestion, abdominal pain, nausea, vomiting, diarrhea,                 change in bowel habits, loss of appetite  Resp: No-   shortness of breath with exertion or at rest.  No-  excess mucus,             No-   productive cough,  No non-productive cough,  No-  coughing up of blood.              No-   change in color of mucus.                           Not aware of wheezing  Skin: No-   rash or lesions.  GU: No-   dysuria, change in color of urine, no urgency or frequency.  No- flank pain.  MS:  No-   joint pain or swelling.  No- decreased range of motion.  No- back pain.  Psych:  No- change in mood or affect. No depression or anxiety.  No memory loss.     Objective:   Physical Exam General- Alert, Oriented, Affect-appropriate, Distress- none  acute Skin- rash-none, lesions- none, excoriation- none Lymphadenopathy- none Head- atraumatic            Eyes- Gross vision intact, PERRLA, conjunctivae clear secretions            Ears- Hearing, canals            Nose- Clear, No-Septal dev, mucus, polyps, erosion, perforation             Throat- Mallampati II , mucosa clear , drainage- none, tonsils- atrophic Neck- flexible , trachea midline, no stridor , thyroid nl, carotid no bruit Chest - symmetrical excursion , unlabored           Heart/CV- RRR , no murmur , no gallop  , no rub, nl s1 s2                           - JVD- none , edema- none, stasis changes- none, varices- none  Lung- Mild wheeze right lung.               cough- none , dullness-none, rub- none           Chest wall-  Abd- tender-no, distended-no, bowel sounds-present, HSM- no Br/ Gen/ Rectal- Not done, not indicated Extrem- cyanosis- none, clubbing, none, atrophy- none, strength- nl Neuro- grossly intact to observation         Assessment & Plan:

## 2010-12-23 NOTE — Assessment & Plan Note (Signed)
She does ok with [place], but is discouraged from using that as a day time med for stress and anxiety. I asked her to review this with Dr Tanya Nones.

## 2011-01-08 ENCOUNTER — Encounter: Payer: Self-pay | Admitting: Family Medicine

## 2011-01-20 ENCOUNTER — Other Ambulatory Visit: Payer: Self-pay | Admitting: *Deleted

## 2011-01-20 MED ORDER — BECLOMETHASONE DIPROPIONATE 40 MCG/ACT IN AERS: 2.0000 | INHALATION_SPRAY | Freq: Two times a day (BID) | RESPIRATORY_TRACT | Status: DC

## 2011-02-22 ENCOUNTER — Ambulatory Visit (INDEPENDENT_AMBULATORY_CARE_PROVIDER_SITE_OTHER): Admitting: Internal Medicine

## 2011-02-22 ENCOUNTER — Encounter: Payer: Self-pay | Admitting: Internal Medicine

## 2011-02-22 VITALS — BP 112/64 | HR 70 | Ht 66.0 in | Wt 211.4 lb

## 2011-02-22 DIAGNOSIS — J309 Allergic rhinitis, unspecified: Secondary | ICD-10-CM

## 2011-02-22 DIAGNOSIS — Z23 Encounter for immunization: Secondary | ICD-10-CM

## 2011-02-22 DIAGNOSIS — G4700 Insomnia, unspecified: Secondary | ICD-10-CM

## 2011-02-22 DIAGNOSIS — J45909 Unspecified asthma, uncomplicated: Secondary | ICD-10-CM

## 2011-02-22 NOTE — Patient Instructions (Signed)
Follow up as needed. Please call as needed.  Flu Vax.

## 2011-02-22 NOTE — Progress Notes (Signed)
Subjective:    Patient ID: Brooke Schneider, female    DOB: 09-14-1950, 60 y.o.   MRN: [id number]  HPI Review of Systems    Objective:   Physical Exam    Assessment & Plan:   Subjective:    Patient ID: Brooke Schneider, female    DOB: 09/09/1950, 60 y.o.   MRN: [id number]  HPI 12/21/10- 60 yoF passive smoker followed for allergic rhinitis, asthma, complicated by Insomnia. Last here April 27, 2010- note reviewed. She was having more insomnia when she had run out of [place]. We discussed Remus Loffler - she is not having sleepwalk problems. She admits tension build-ups and feeling she needs to have some help relaxing stress and tension during the day. Her PCP, Dr Tanya Nones, is refilling her [place]. She is taking a med for headaches.  Her husband still smokes occasional cigar outside of house. The smell on him tightens her. She will use claritin, rescue inhaler used 1-2x/ week. Thinks Singulair helps.   02/23/11- 60 yoF passive smoker followed for allergic rhinitis, asthma, complicated by Insomnia. She had to get old smelly cigar box out of house- it bothered her that much.Marland Kitchen PCP Dr Tanya Nones is treating for viral illness started around time of oral surgery done August 23. Got penicillin. Asks flu vaccine today-discussed.  Review of Systems Constitutional:   No-   weight loss, night sweats, fevers, chills, fatigue, lassitude. HEENT:   No-  difficulty swallowing, tooth/dental problems, sore throat,            Occasional frontal headaches                 No-   sneezing, itching, ear ache, nasal congestion, post nasal drip,  CV:  No-   chest pain, orthopnea, PND, swelling in lower extremities, anasarca, dizziness, palpitations GI:  No-   heartburn, indigestion, abdominal pain, nausea, vomiting, diarrhea,                 change in bowel habits, loss of appetite Resp: No-   shortness of breath with exertion or at rest.  No-  excess mucus,             No-   productive cough,  No non-productive cough,  No-   coughing up of blood.              No-   change in color of mucus.                           Not aware of wheezing Skin: No-   rash or lesions. GU: No-   dysuria, change in color of urine, no urgency or frequency.  No- flank pain. MS:  No-   joint pain or swelling.  No- decreased range of motion.  No- back pain. Psych:  No- change in mood or affect. No depression or anxiety.  No memory loss.     Objective:   Physical Exam General- Alert, Oriented, Affect-appropriate, Distress- none acute Skin- rash-none, lesions- none, excoriation- none Lymphadenopathy- none Head- atraumatic            Eyes- Gross vision intact, PERRLA, conjunctivae clear secretions            Ears- Hearing, canals            Nose- Clear, No-Septal dev, mucus, polyps, erosion, perforation             Throat- Mallampati II , mucosa clear ,  drainage- none, tonsils- atrophic Neck- flexible , trachea midline, no stridor , thyroid nl, carotid no bruit Chest - symmetrical excursion , unlabored           Heart/CV- RRR , no murmur , no gallop  , no rub, nl s1 s2                           - JVD- none , edema- none, stasis changes- none, varices- none           Lung- Mild wheeze right lung.               cough- none , dullness-none, rub- none           Chest wall-  Abd- tender-no, distended-no, bowel sounds-present, HSM- no Br/ Gen/ Rectal- Not done, not indicated Extrem- cyanosis- none, clubbing, none, atrophy- none, strength- nl Neuro- grossly intact to observation         Assessment & Plan:

## 2011-02-27 NOTE — Assessment & Plan Note (Signed)
Her primary doctor is supplying Ambien which seems to work well enough for her.

## 2011-02-27 NOTE — Assessment & Plan Note (Signed)
Seasonal symptoms now adequately controlled with over-the-counter antihistamine if needed.

## 2011-02-27 NOTE — Assessment & Plan Note (Signed)
Very sensitive to external smells, some of which have acquired an emotional baggage as well, such as her husband smokes cigars, We discussed this.

## 2011-04-21 ENCOUNTER — Encounter (HOSPITAL_COMMUNITY): Payer: Self-pay | Admitting: Emergency Medicine

## 2011-04-21 ENCOUNTER — Emergency Department (HOSPITAL_COMMUNITY)
Admission: EM | Admit: 2011-04-21 | Discharge: 2011-04-21 | Disposition: A | Discharge status: Home / Self Care | Attending: Emergency Medicine | Admitting: Emergency Medicine

## 2011-04-21 ENCOUNTER — Emergency Department (HOSPITAL_COMMUNITY)

## 2011-04-21 DIAGNOSIS — R059 Cough, unspecified: Secondary | ICD-10-CM | POA: Insufficient documentation

## 2011-04-21 DIAGNOSIS — Z79899 Other long term (current) drug therapy: Secondary | ICD-10-CM | POA: Insufficient documentation

## 2011-04-21 DIAGNOSIS — J45909 Unspecified asthma, uncomplicated: Secondary | ICD-10-CM | POA: Insufficient documentation

## 2011-04-21 DIAGNOSIS — R05 Cough: Secondary | ICD-10-CM | POA: Insufficient documentation

## 2011-04-21 DIAGNOSIS — J40 Bronchitis, not specified as acute or chronic: Secondary | ICD-10-CM | POA: Insufficient documentation

## 2011-04-21 DIAGNOSIS — F329 Major depressive disorder, single episode, unspecified: Secondary | ICD-10-CM | POA: Insufficient documentation

## 2011-04-21 DIAGNOSIS — R509 Fever, unspecified: Secondary | ICD-10-CM | POA: Insufficient documentation

## 2011-04-21 DIAGNOSIS — F3289 Other specified depressive episodes: Secondary | ICD-10-CM | POA: Insufficient documentation

## 2011-04-21 MED ORDER — ALBUTEROL SULFATE HFA 108 (90 BASE) MCG/ACT IN AERS: 2.0000 | INHALATION_SPRAY | RESPIRATORY_TRACT | Status: DC

## 2011-04-21 MED ORDER — ALBUTEROL SULFATE (5 MG/ML) 0.5% IN NEBU
5.0000 mg | INHALATION_SOLUTION | Freq: Once | RESPIRATORY_TRACT | Status: AC
  Administered 2011-04-21: 5 mg via RESPIRATORY_TRACT
  Filled 2011-04-21: qty 1

## 2011-04-21 MED ORDER — AZITHROMYCIN 250 MG PO TABS: ORAL_TABLET | ORAL | Status: DC

## 2011-04-21 MED ORDER — PREDNISONE 20 MG PO TABS
60.0000 mg | ORAL_TABLET | Freq: Once | ORAL | Status: AC
  Administered 2011-04-21: 60 mg via ORAL
  Filled 2011-04-21: qty 1
  Filled 2011-04-21: qty 2
  Filled 2011-04-21: qty 1

## 2011-04-21 MED ORDER — PREDNISONE 20 MG PO TABS: 60.0000 mg | ORAL_TABLET | Freq: Every day | ORAL | Status: AC

## 2011-04-21 NOTE — ED Provider Notes (Signed)
History     CSN: [id number] Arrival date & time: 04/21/2011  3:46 PM   First MD Initiated Contact with Patient 04/21/11 1605      No chief complaint on file.   (Consider location/radiation/quality/duration/timing/severity/associated sxs/prior treatment) Patient is a 60 y.o. female presenting with cough. The history is provided by the patient.  Cough This is a new problem. The current episode started more than 2 days ago. The problem occurs constantly. The problem has not changed since onset.The cough is productive of sputum. There has been no fever. Associated symptoms include wheezing. Pertinent negatives include no chest pain, no rhinorrhea, no sore throat, no shortness of breath and no eye redness. She has tried nothing for the symptoms. She is not a smoker. Her past medical history is significant for asthma. Her past medical history does not include pneumonia or bronchiectasis.    Past Medical History  Diagnosis Date  . Goiter   . Asthma   . Allergic rhinitis   . Depression   . Goiter     Past Surgical History  Procedure Date  . Knee surgery   . Thyroidectomy   . Abdominal hysterectomy     Family History  Problem Relation Age of Onset  . Allergies Mother   . ALS Father   . Prostate cancer Father     History  Substance Use Topics  . Smoking status: Passive Smoker  . Smokeless tobacco: Never Used   Comment: husband smoking cigars  . Alcohol Use: No    OB History    Grav Para Term Preterm Abortions TAB SAB Ect Mult Living                  Review of Systems  Constitutional: Positive for fever (subjective).  HENT: Negative for sore throat and rhinorrhea.   Eyes: Negative for redness.  Respiratory: Positive for cough and wheezing. Negative for shortness of breath.   Cardiovascular: Negative for chest pain.  All other systems reviewed and are negative.    Allergies  Sulfamethoxazole w/trimethoprim and Metronidazole  Home Medications   Current  Outpatient Rx  Name Route Sig Dispense Refill  . ALBUTEROL SULFATE HFA 108 (90 BASE) MCG/ACT IN AERS Inhalation Inhale 2 puffs into the lungs every 6 (six) hours as needed. For shortness of breath     . CALCIUM CARBONATE-VITAMIN D 600-400 MG-UNIT PO TABS Oral Take 1 tablet by mouth daily.     Marland Kitchen LEVOTHYROXINE SODIUM 112 MCG PO TABS Oral Take 112 mcg by mouth daily.     Marland Kitchen RIZATRIPTAN BENZOATE 10 MG PO TABS Oral Take 10 mg by mouth daily. Once a day for migraines     . TOPIRAMATE 25 MG PO TABS Oral Take 100 mg by mouth at bedtime.     Marland Kitchen ZOLPIDEM TARTRATE 10 MG PO TABS Oral Take 10 mg by mouth at bedtime as needed. For insomnia       BP 123/76  Pulse 84  Temp(Src) 98.5 F (36.9 C) (Oral)  Resp 19  SpO2 96%  Physical Exam  Nursing note and vitals reviewed. Constitutional: She is oriented to person, place, and time. She appears well-developed and well-nourished. No distress.  HENT:  Head: Normocephalic and atraumatic.  Eyes: Conjunctivae are normal. Pupils are equal, round, and reactive to light.  Neck: Normal range of motion. Neck supple.  Cardiovascular: Normal rate and regular rhythm.   Pulmonary/Chest: Effort normal. No respiratory distress. She has wheezes.  Abdominal: Soft.  Musculoskeletal: Normal range of motion.  She exhibits no tenderness.  Neurological: She is alert and oriented to person, place, and time. No cranial nerve deficit.  Skin: Skin is warm and dry.    ED Course  Procedures (including critical care time)  Labs Reviewed - No data to display Dg Chest 2 View  04/21/2011  *RADIOLOGY REPORT*  Clinical Data: Cough and wheezing.  CHEST - 2 VIEW  Comparison: Chest x-ray 10/23/2009.  Findings: The cardiac silhouette, mediastinal and hilar contours are within normal limits and stable.  Low lung volumes with streaky bibasilar subsegmental atelectasis.  No infiltrates, edema or effusions.  The bony thorax is intact.  IMPRESSION: Streaky bibasilar atelectasis.  Original Report  Authenticated By: P. Loralie Champagne, M.D.     1. Bronchitis       MDM  Pt presented due to productive cough, subj fever, wheezing at home.  She did not try albuterol at home.  She is well apeparing, mild wheezign noted.  Given albuterol.  CXR neg.  Given prednisone.  Will treat for possible bronchitis.  Will d/c home.          Nena Alexander, MD 04/21/11 1726

## 2011-04-21 NOTE — ED Notes (Signed)
Cough and fever that started this weekend  And some wheezing  Congestion in face and head makes her h/a hurt

## 2011-04-21 NOTE — ED Provider Notes (Signed)
Medical screening examination/treatment/procedure(s) were conducted as a shared visit with non-physician practitioner(s) and myself.  I personally evaluated the patient during the encounter  Patient with history of asthma. Has a cough which is gotten progressively worse over the past several days. No significant shortness of breath. She does have some production of sputum.  She is wheezing on examination. Regular rate and rhythm. No signs of respiratory distress  Albuterol treatments, steroids discharge home  Dayton Bailiff, MD 04/21/11 2330

## 2011-04-21 NOTE — ED Notes (Signed)
Pt d/c home. Pt ambulatory and walked with a quick steady gait. Pt in NAD at this time. Pt voiced understanding of d/c instructions and prescriptions.

## 2011-12-23 ENCOUNTER — Ambulatory Visit
Admission: RE | Admit: 2011-12-23 | Discharge: 2011-12-23 | Disposition: A | Discharge status: Home / Self Care | Source: Ambulatory Visit | Attending: Family Medicine | Admitting: Family Medicine

## 2011-12-23 ENCOUNTER — Other Ambulatory Visit: Payer: Self-pay | Admitting: Family Medicine

## 2011-12-23 DIAGNOSIS — M25569 Pain in unspecified knee: Secondary | ICD-10-CM

## 2012-10-02 ENCOUNTER — Telehealth: Payer: Self-pay | Admitting: Family Medicine

## 2012-10-02 MED ORDER — ZOLPIDEM TARTRATE 10 MG PO TABS: 10.0000 mg | ORAL_TABLET | Freq: Every evening | ORAL | Status: DC | PRN

## 2012-10-02 MED ORDER — LEVOTHYROXINE SODIUM 112 MCG PO TABS: 112.0000 ug | ORAL_TABLET | Freq: Every day | ORAL | Status: DC

## 2012-10-02 NOTE — Telephone Encounter (Signed)
Ok to refill 

## 2012-10-02 NOTE — Telephone Encounter (Signed)
?   OK to Refill  

## 2012-10-02 NOTE — Telephone Encounter (Signed)
Rx Refilled  

## 2012-10-11 ENCOUNTER — Ambulatory Visit (INDEPENDENT_AMBULATORY_CARE_PROVIDER_SITE_OTHER): Admitting: Family Medicine

## 2012-10-11 ENCOUNTER — Encounter: Payer: Self-pay | Admitting: Family Medicine

## 2012-10-11 VITALS — BP 130/70 | HR 86 | Temp 98.2°F | Resp 18 | Wt 214.0 lb

## 2012-10-11 DIAGNOSIS — Z1231 Encounter for screening mammogram for malignant neoplasm of breast: Secondary | ICD-10-CM

## 2012-10-11 DIAGNOSIS — Z1239 Encounter for other screening for malignant neoplasm of breast: Secondary | ICD-10-CM

## 2012-10-11 DIAGNOSIS — J309 Allergic rhinitis, unspecified: Secondary | ICD-10-CM

## 2012-10-11 MED ORDER — PREDNISONE 20 MG PO TABS: ORAL_TABLET | ORAL | Status: DC

## 2012-10-11 NOTE — Progress Notes (Signed)
  Subjective:    Patient ID: Brooke Schneider, female    DOB: 28-Aug-1950, 62 y.o.   MRN: [id number]  HPI Patient has a history of severe allergies. Her last 2 weeks she's noticed increasing sinus congestion, rhinorrhea, frontal and maxillary sinus pressure, postnasal drip, and headaches. She's also having symptoms of eustachian tube dysfunction. She will intermittently feel pressure in her right ear. She has decreased hearing in that ear. She denies any fevers. She denies severe headaches. She is also reporting some increased wheezing due to secondary smoke exposure from her son. She denies shortness of breath. She denies chest pain. She denies productive cough. Past Medical History  Diagnosis Date  . Goiter   . Asthma   . Allergic rhinitis   . Depression   . Goiter    Current Outpatient Prescriptions on File Prior to Visit  Medication Sig Dispense Refill  . albuterol (PROVENTIL HFA;VENTOLIN HFA) 108 (90 BASE) MCG/ACT inhaler Inhale 2 puffs into the lungs every 4 (four) hours.  1 Inhaler  0  . Calcium Carbonate-Vitamin D (CALCIUM 600+D) 600-400 MG-UNIT per tablet Take 1 tablet by mouth daily.       Marland Kitchen levothyroxine (SYNTHROID, LEVOTHROID) 112 MCG tablet Take 1 tablet (112 mcg total) by mouth daily.  90 tablet  2  . rizatriptan (MAXALT) 10 MG tablet Take 10 mg by mouth daily. Once a day for migraines       . topiramate (TOPAMAX) 25 MG tablet Take 100 mg by mouth at bedtime.       Marland Kitchen zolpidem (AMBIEN) 10 MG tablet Take 1 tablet (10 mg total) by mouth at bedtime as needed. For insomnia  30 tablet  0   No current facility-administered medications on file prior to visit.   Allergies  Allergen Reactions  . Sulfamethoxazole W-Trimethoprim Swelling  . Metronidazole Itching and Rash      Review of Systems    remainder of review of systems is negative Objective:   Physical Exam  Constitutional: She appears well-developed and well-nourished.  HENT:  Right Ear: External ear normal.  Left Ear:  External ear normal.  Nose: Mucosal edema and rhinorrhea present. Right sinus exhibits frontal sinus tenderness. Left sinus exhibits frontal sinus tenderness.  Mouth/Throat: Oropharynx is clear and moist.  Eyes: Conjunctivae and EOM are normal. Pupils are equal, round, and reactive to light.  Neck: Normal range of motion. Neck supple. No JVD present. No thyromegaly present.  Cardiovascular: Normal rate and normal heart sounds.   Pulmonary/Chest: Effort normal and breath sounds normal.  Lymphadenopathy:    She has no cervical adenopathy.          Assessment & Plan:  1. Allergic rhinosinusitis Sudafed 60 mg by mouth every 4 hours when necessary congestion. Continue Zyrtec 10 mg by mouth daily. Add Flonase 2 sprays each nostril inhaled daily. Treat with prednisone 20 mg tablets. She'll take 3 tablets on days one and day 2. 2 tabs on day 3 and 4. 1 tablet on day 5 and 6.  Use antibiotics only if signs of infection develop.  2. Breast cancer screening - MM Digital Screening; Future

## 2012-11-10 ENCOUNTER — Telehealth: Payer: Self-pay | Admitting: Family Medicine

## 2012-11-10 MED ORDER — ZOLPIDEM TARTRATE 10 MG PO TABS: 10.0000 mg | ORAL_TABLET | Freq: Every evening | ORAL | Status: DC | PRN

## 2012-11-10 NOTE — Telephone Encounter (Signed)
Medication refilled per protocol. 

## 2012-11-12 ENCOUNTER — Other Ambulatory Visit: Payer: Self-pay

## 2012-11-12 MED ORDER — TOPIRAMATE 50 MG PO TABS: 50.0000 mg | ORAL_TABLET | Freq: Two times a day (BID) | ORAL | Status: DC

## 2012-11-14 ENCOUNTER — Telehealth: Payer: Self-pay | Admitting: Family Medicine

## 2012-11-14 ENCOUNTER — Ambulatory Visit

## 2012-11-14 NOTE — Telephone Encounter (Signed)
?   OK to Refill  

## 2012-11-14 NOTE — Telephone Encounter (Signed)
Ok to refill 

## 2012-11-15 ENCOUNTER — Ambulatory Visit
Admission: RE | Admit: 2012-11-15 | Discharge: 2012-11-15 | Disposition: A | Discharge status: Home / Self Care | Source: Ambulatory Visit | Attending: Family Medicine | Admitting: Family Medicine

## 2012-11-15 DIAGNOSIS — Z1231 Encounter for screening mammogram for malignant neoplasm of breast: Secondary | ICD-10-CM

## 2012-11-15 MED ORDER — ZOLPIDEM TARTRATE 10 MG PO TABS: 10.0000 mg | ORAL_TABLET | Freq: Every evening | ORAL | Status: DC | PRN

## 2012-11-15 NOTE — Telephone Encounter (Signed)
Rx Refilled  

## 2012-11-27 ENCOUNTER — Ambulatory Visit (INDEPENDENT_AMBULATORY_CARE_PROVIDER_SITE_OTHER): Admitting: Nurse Practitioner

## 2012-11-27 ENCOUNTER — Encounter: Payer: Self-pay | Admitting: Nurse Practitioner

## 2012-11-27 VITALS — BP 124/86 | HR 84 | Ht 65.5 in | Wt 213.0 lb

## 2012-11-27 DIAGNOSIS — G43909 Migraine, unspecified, not intractable, without status migrainosus: Secondary | ICD-10-CM | POA: Insufficient documentation

## 2012-11-27 MED ORDER — RIZATRIPTAN BENZOATE 10 MG PO TABS: 10.0000 mg | ORAL_TABLET | Freq: Every day | ORAL | Status: DC

## 2012-11-27 MED ORDER — TOPIRAMATE 50 MG PO TABS: 50.0000 mg | ORAL_TABLET | Freq: Two times a day (BID) | ORAL | Status: DC

## 2012-11-27 NOTE — Progress Notes (Signed)
HPI: Patient returns for followup after last visit 10/25/2011. She has history of headaches since June 2012 were more intense rating at 7 of 10-3 times per week starting at the mid left parietal area going to the frontal area and then will spread across the whole frontal area sometimes she will have pain on her ear and down the left side of her neck. The pain was initially like a sharp pain which would last for 2-3 hours, she was taking Maxalt with relief. She was also taking Topamax  She did have some mild blurred vision and noise discomfort with these headaches initially.  Denies photophobia, nausea, vomiting or dizziness.   Pt  on Topamax at 50mg  BID and headaches are reduced to 1 every 1-4 weeks. She says Maxalt is beneficial. She has more stress now that her son is living with her who is an alcoholic and also smokes.  She has retired from [employer]. She is pleased with her responce to medication.   ROS:  Weight gain, fatigue, mild depression Physical Exam General: well developed, well nourished, seated, in no evident distress Head: head normocephalic and atraumatic. Oropharynx benign Neck: supple with no carotid  bruits Cardiovascular: regular rate and rhythm, no murmurs  Neurologic Exam Mental Status: Awake and fully alert. Oriented to place and time. Follows all commands. Speech and language normal.   Cranial Nerves:  Pupils equal, briskly reactive to light. Extraocular movements full without nystagmus. Visual fields full to confrontation. Hearing intact and symmetric to finger snap. Facial sensation intact. Face, tongue, palate move normally and symmetrically. Neck flexion and extension normal.  Motor: Normal bulk and tone. Normal strength in all tested extremity muscles.No focal weakness Sensory.: intact to touch and pinprick and vibratory.  Coordination: Rapid alternating movements normal in all extremities. Finger-to-nose and heel-to-shin performed accurately bilaterally. Gait  and Station: Arises from chair without difficulty. Stance is normal. . Able to heel, toe and tandem walk without difficulty.  Reflexes: 2+ and symmetric. Toes downgoing.     ASSESSMENT: Migraine headaches, doing well on Topamax and Maxalt acutely. Normal neurologic exam     PLAN: Continue Topamax 50 twice a day,  prescription given Continue Maxalt 10 mg when necessary acute headache. Rx given Follow up yearly and when necessary Nilda Riggs, GNP-BC APRN

## 2012-11-27 NOTE — Patient Instructions (Addendum)
Continue Topamax 50 twice a day,  prescription given Continue Maxalt 10 mg when necessary acute headache Follow up yearly and when necessary

## 2012-12-21 ENCOUNTER — Encounter: Payer: Self-pay | Admitting: Family Medicine

## 2012-12-21 ENCOUNTER — Ambulatory Visit (INDEPENDENT_AMBULATORY_CARE_PROVIDER_SITE_OTHER): Admitting: Family Medicine

## 2012-12-21 VITALS — BP 108/84 | HR 80 | Temp 98.2°F | Resp 18 | Wt 210.0 lb

## 2012-12-21 DIAGNOSIS — G43909 Migraine, unspecified, not intractable, without status migrainosus: Secondary | ICD-10-CM

## 2012-12-21 DIAGNOSIS — K219 Gastro-esophageal reflux disease without esophagitis: Secondary | ICD-10-CM

## 2012-12-21 MED ORDER — PROPRANOLOL HCL ER 60 MG PO CP24: 60.0000 mg | ORAL_CAPSULE | Freq: Every day | ORAL | Status: DC

## 2012-12-21 MED ORDER — RIZATRIPTAN BENZOATE 10 MG PO TABS: 10.0000 mg | ORAL_TABLET | Freq: Every day | ORAL | Status: DC

## 2012-12-21 MED ORDER — OMEPRAZOLE 20 MG PO CPDR: 20.0000 mg | DELAYED_RELEASE_CAPSULE | Freq: Every day | ORAL | Status: DC

## 2012-12-21 NOTE — Progress Notes (Signed)
Subjective:    Patient ID: Brooke Schneider, female    DOB: 1951/04/03, 62 y.o.   MRN: [id number]  HPI Patient has a history of migraines. She is current taking Topamax 50 mg by mouth twice a day.  She also takes Maxalt 10 mg by mouth daily when necessary for migraine.  The Topamax has helped substantially. The Maxalt also has helped. However she is having use the Maxalt 3 times a week. She reports a constant throbbing dull pain in her forehead. She reports photophobia associated with this and nausea. This occurs almost on a daily basis. If she takes the Maxalt the pain subside. She denies any vision changes associated with this.    Also reports left upper quadrant abdominal pain for over a year. She states it improves when she takes Zantac. It is a dull nagging pain at times. There is no shortness of breath chest pain or angina. She denies melena or hematochezia.  Past Medical History  Diagnosis Date  . Goiter   . Asthma   . Allergic rhinitis   . Depression   . Goiter   . [id number](308.6)    Current Outpatient Prescriptions on File Prior to Visit  Medication Sig Dispense Refill  . albuterol (PROVENTIL HFA;VENTOLIN HFA) 108 (90 BASE) MCG/ACT inhaler Inhale 2 puffs into the lungs every 4 (four) hours.  1 Inhaler  0  . buPROPion (WELLBUTRIN XL) 150 MG 24 hr tablet Take 150 mg by mouth daily.      . Calcium Carbonate-Vitamin D (CALCIUM 600+D) 600-400 MG-UNIT per tablet Take 1 tablet by mouth daily.       Marland Kitchen estradiol (CLIMARA - DOSED IN MG/24 HR) 0.025 mg/24hr Place 1 patch onto the skin once a week.      . levothyroxine (SYNTHROID, LEVOTHROID) 112 MCG tablet Take 1 tablet (112 mcg total) by mouth daily.  90 tablet  2  . ranitidine (ZANTAC) 75 MG tablet Take 75 mg by mouth 2 (two) times daily.      Marland Kitchen topiramate (TOPAMAX) 50 MG tablet Take 1 tablet (50 mg total) by mouth 2 (two) times daily.  60 tablet  6  . zolpidem (AMBIEN) 10 MG tablet Take 1 tablet (10 mg total) by mouth at bedtime as needed.  For insomnia  30 tablet  1   No current facility-administered medications on file prior to visit.   Allergies  Allergen Reactions  . Sulfamethoxazole W-Trimethoprim Swelling  . Metronidazole Itching and Rash   History   Social History  . Marital Status: Married    Spouse Name: N/A    Number of Children: N/A  . Years of Education: N/A   Occupational History  . housewife    Social History Main Topics  . Smoking status: Passive Smoke Exposure - Never Smoker  . Smokeless tobacco: Never Used     Comment: husband smoking cigars  . Alcohol Use: Yes     Comment: occ  . Drug Use: No  . Sexually Active: Not on file   Other Topics Concern  . Not on file   Social History Narrative  . No narrative on file      Review of Systems  All other systems reviewed and are negative.       Objective:   Physical Exam  Vitals reviewed. Constitutional: She is oriented to person, place, and time. She appears well-developed and well-nourished.  Neck: Neck supple. No thyromegaly present.  Cardiovascular: Normal rate, regular rhythm and normal heart sounds.   Pulmonary/Chest:  Effort normal and breath sounds normal. No respiratory distress. She has no wheezes. She has no rales.  Abdominal: Soft. Bowel sounds are normal. She exhibits no distension. There is no tenderness. There is no rebound and no guarding.  Lymphadenopathy:    She has no cervical adenopathy.  Neurological: She is alert and oriented to person, place, and time. She has normal reflexes. She displays normal reflexes. No cranial nerve deficit. She exhibits normal muscle tone. Coordination normal.          Assessment & Plan:  1. Migraine, unspecified, without mention of intractable migraine without mention of status migrainosus Although she is better, she is not yet at goal. I am going to add Inderal LA 60 mg by mouth daily to the Topamax 50 mg by mouth twice a day to see if we can decrease the frequency of her headaches  and her reliance on Maxalt. - propranolol ER (INDERAL LA) 60 MG 24 hr capsule; Take 1 capsule (60 mg total) by mouth daily.  Dispense: 30 capsule; Refill: 5  2. GERD (gastroesophageal reflux disease) She may have mild gastritis. Begin omeprazole 20 mg by mouth daily. If her pain improves I would continue this for a total of 8 weeks then discontinue. - omeprazole (PRILOSEC) 20 MG capsule; Take 1 capsule (20 mg total) by mouth daily.  Dispense: 30 capsule; Refill: 3

## 2013-02-02 ENCOUNTER — Telehealth: Payer: Self-pay | Admitting: Family Medicine

## 2013-02-02 MED ORDER — ALBUTEROL SULFATE HFA 108 (90 BASE) MCG/ACT IN AERS: 2.0000 | INHALATION_SPRAY | Freq: Four times a day (QID) | RESPIRATORY_TRACT | Status: DC | PRN

## 2013-02-02 NOTE — Telephone Encounter (Signed)
Rx Refilled  

## 2013-02-02 NOTE — Telephone Encounter (Signed)
Proair HFA 90 mcg inhaler inhale 2 puffs q4 hours prn #8.5 gm

## 2013-02-28 ENCOUNTER — Telehealth: Payer: Self-pay | Admitting: Family Medicine

## 2013-02-28 DIAGNOSIS — G43909 Migraine, unspecified, not intractable, without status migrainosus: Secondary | ICD-10-CM

## 2013-02-28 DIAGNOSIS — K219 Gastro-esophageal reflux disease without esophagitis: Secondary | ICD-10-CM

## 2013-02-28 MED ORDER — ESTRADIOL 0.025 MG/24HR TD PTWK: 1.0000 | MEDICATED_PATCH | TRANSDERMAL | Status: DC

## 2013-02-28 MED ORDER — ALBUTEROL SULFATE HFA 108 (90 BASE) MCG/ACT IN AERS: 2.0000 | INHALATION_SPRAY | Freq: Four times a day (QID) | RESPIRATORY_TRACT | Status: DC | PRN

## 2013-02-28 MED ORDER — PROPRANOLOL HCL ER 60 MG PO CP24: 60.0000 mg | ORAL_CAPSULE | Freq: Every day | ORAL | Status: DC

## 2013-02-28 MED ORDER — RIZATRIPTAN BENZOATE 10 MG PO TABS: 10.0000 mg | ORAL_TABLET | Freq: Every day | ORAL | Status: DC

## 2013-02-28 MED ORDER — OMEPRAZOLE 20 MG PO CPDR: 20.0000 mg | DELAYED_RELEASE_CAPSULE | Freq: Every day | ORAL | Status: DC

## 2013-02-28 MED ORDER — LEVOTHYROXINE SODIUM 112 MCG PO TABS: 112.0000 ug | ORAL_TABLET | Freq: Every day | ORAL | Status: DC

## 2013-02-28 MED ORDER — TOPIRAMATE 50 MG PO TABS: 50.0000 mg | ORAL_TABLET | Freq: Two times a day (BID) | ORAL | Status: DC

## 2013-02-28 MED ORDER — BUPROPION HCL ER (XL) 150 MG PO TB24: 150.0000 mg | ORAL_TABLET | Freq: Every day | ORAL | Status: DC

## 2013-02-28 NOTE — Telephone Encounter (Signed)
Wellbutrin Xl 150 mg tab 1 QD #90 Topiramate 50 mg tab 1 BID #180 Maxalt 10 mg 1 QD #90 Levothyroxine 125 mcg tab 1 QD #90 Proair HFA Inh 8.5 gm 90 mcg 2 puffs q6 hours prn wheezing 90 day supply Propranolol HCL ER 60 mg cap 1 QD #90 Omeprazole 20 mg cap 1 QD #90 Estradiol TD 0.025 mg patch 1 patch qweek 90 day supply

## 2013-02-28 NOTE — Telephone Encounter (Signed)
.  Rx Refilled and letter sent for pt to schedule appt.

## 2013-03-22 ENCOUNTER — Encounter: Payer: Self-pay | Admitting: Family Medicine

## 2013-03-22 ENCOUNTER — Ambulatory Visit (INDEPENDENT_AMBULATORY_CARE_PROVIDER_SITE_OTHER): Admitting: Family Medicine

## 2013-03-22 VITALS — BP 110/80 | HR 60 | Temp 97.1°F | Resp 18 | Wt 214.0 lb

## 2013-03-22 DIAGNOSIS — H6980 Other specified disorders of Eustachian tube, unspecified ear: Secondary | ICD-10-CM

## 2013-03-22 DIAGNOSIS — H6981 Other specified disorders of Eustachian tube, right ear: Secondary | ICD-10-CM

## 2013-03-22 DIAGNOSIS — B372 Candidiasis of skin and nail: Secondary | ICD-10-CM

## 2013-03-22 MED ORDER — CLOTRIMAZOLE-BETAMETHASONE 1-0.05 % EX CREA: TOPICAL_CREAM | Freq: Two times a day (BID) | CUTANEOUS | Status: DC

## 2013-03-22 MED ORDER — FLUTICASONE PROPIONATE 50 MCG/ACT NA SUSP: 2.0000 | Freq: Every day | NASAL | Status: DC

## 2013-03-22 NOTE — Progress Notes (Signed)
Subjective:    Patient ID: Brooke Schneider, female    DOB: 04/19/1951, 62 y.o.   MRN: [id number]  HPI  Incidentally migraines are much better on the combination of propanolol and Topamax. She is only occasionally having to use maxalt.  She is here today for a rash between her breasts as well as some problems in her right ear. The rash between her breasts and in the intertriginous areas on her sternum. It is an erythematous hyperpigmented patch with satellite macules of erythema. It appears to be Candida intertrigo..  she also reports 6 months of episodic pressure and fullness in the right ear. She will occasionally hear the sound of air or "wind"  in the ear like pressure is releasing. Past Medical History  Diagnosis Date  . Goiter   . Asthma   . Allergic rhinitis   . Depression   . Goiter   . [id number](478.2)    Current Outpatient Prescriptions on File Prior to Visit  Medication Sig Dispense Refill  . albuterol (PROAIR HFA) 108 (90 BASE) MCG/ACT inhaler Inhale 2 puffs into the lungs every 6 (six) hours as needed for wheezing.  54 g  0  . buPROPion (WELLBUTRIN XL) 150 MG 24 hr tablet Take 1 tablet (150 mg total) by mouth daily.  90 tablet  0  . Calcium Carbonate-Vitamin D (CALCIUM 600+D) 600-400 MG-UNIT per tablet Take 1 tablet by mouth daily.       Marland Kitchen estradiol (CLIMARA - DOSED IN MG/24 HR) 0.025 mg/24hr patch Place 1 patch (0.025 mg total) onto the skin once a week.  12 patch  0  . levothyroxine (SYNTHROID, LEVOTHROID) 112 MCG tablet Take 1 tablet (112 mcg total) by mouth daily.  90 tablet  0  . omeprazole (PRILOSEC) 20 MG capsule Take 1 capsule (20 mg total) by mouth daily.  90 capsule  0  . propranolol ER (INDERAL LA) 60 MG 24 hr capsule Take 1 capsule (60 mg total) by mouth daily.  90 capsule  0  . rizatriptan (MAXALT) 10 MG tablet Take 1 tablet (10 mg total) by mouth daily. Once a day for migraines  30 tablet  0  . topiramate (TOPAMAX) 50 MG tablet Take 1 tablet (50 mg total) by mouth 2  (two) times daily.  180 tablet  0  . zolpidem (AMBIEN) 10 MG tablet Take 1 tablet (10 mg total) by mouth at bedtime as needed. For insomnia  30 tablet  1  . ranitidine (ZANTAC) 75 MG tablet Take 75 mg by mouth 2 (two) times daily.       No current facility-administered medications on file prior to visit.   Allergies  Allergen Reactions  . Sulfamethoxazole-Trimethoprim Swelling  . Metronidazole Itching and Rash   History   Social History  . Marital Status: Married    Spouse Name: N/A    Number of Children: N/A  . Years of Education: N/A   Occupational History  . housewife    Social History Main Topics  . Smoking status: Passive Smoke Exposure - Never Smoker  . Smokeless tobacco: Never Used     Comment: husband smoking cigars  . Alcohol Use: Yes     Comment: occ  . Drug Use: No  . Sexual Activity: Not on file   Other Topics Concern  . Not on file   Social History Narrative  . No narrative on file     Review of Systems  All other systems reviewed and are negative.  Objective:   Physical Exam  Vitals reviewed. HENT:  Right Ear: Hearing, tympanic membrane, external ear and ear canal normal.  Left Ear: Hearing, tympanic membrane, external ear and ear canal normal.  Nose: Nose normal.  Mouth/Throat: Oropharynx is clear and moist. No oropharyngeal exudate.  Cardiovascular: Normal rate, regular rhythm and normal heart sounds.   Pulmonary/Chest: Effort normal and breath sounds normal.  Skin: Rash noted. There is erythema.   intertrigo seen in the area between the breast.        Assessment & Plan:  1. Candidal intertrigo - clotrimazole-betamethasone (LOTRISONE) cream; Apply topically 2 (two) times daily.  Dispense: 30 g; Refill: 0  2. Eustachian tube dysfunction, right Recommended Flonase 2 sprays each nostril daily. Recommended gentle exercises to try to help open the eustachian tube. If symptoms persist could consider consulting ENT for possible PE tube  placement. - fluticasone (FLONASE) 50 MCG/ACT nasal spray; Place 2 sprays into the nose daily.  Dispense: 16 g; Refill: 6

## 2013-04-25 ENCOUNTER — Other Ambulatory Visit: Payer: Self-pay | Admitting: Family Medicine

## 2013-05-06 ENCOUNTER — Other Ambulatory Visit: Payer: Self-pay | Admitting: Family Medicine

## 2013-05-08 ENCOUNTER — Encounter: Admitting: Family Medicine

## 2013-05-12 ENCOUNTER — Other Ambulatory Visit: Payer: Self-pay | Admitting: Family Medicine

## 2013-06-21 ENCOUNTER — Other Ambulatory Visit: Payer: Self-pay | Admitting: Family Medicine

## 2013-07-06 ENCOUNTER — Encounter: Payer: Self-pay | Admitting: Family Medicine

## 2013-07-06 ENCOUNTER — Ambulatory Visit (INDEPENDENT_AMBULATORY_CARE_PROVIDER_SITE_OTHER): Admitting: Family Medicine

## 2013-07-06 VITALS — BP 110/74 | HR 78 | Temp 97.0°F | Resp 22 | Ht 66.0 in | Wt 213.0 lb

## 2013-07-06 DIAGNOSIS — G4700 Insomnia, unspecified: Secondary | ICD-10-CM

## 2013-07-06 MED ORDER — TRAZODONE HCL 50 MG PO TABS: 50.0000 mg | ORAL_TABLET | Freq: Every evening | ORAL | Status: DC | PRN

## 2013-07-06 NOTE — Progress Notes (Signed)
Subjective:    Patient ID: Brooke Schneider, female    DOB: 06/02/1951, 63 y.o.   MRN: [id number]  HPI Patient continues to have trouble with insomnia. In the past she took Ambien with success however she discontinued medication because she was worried about habituation.  At present she is taking ZZquil which seems to help slightly. She must take the medication on a nightly basis to help her sleep. She denies any depression or anxiety as potential contributing factors. She continues to have occasional dull headaches but it is much better than it was. She is satisfied with the Topamax is present dosage. Otherwise she has no concerns. Past Medical History  Diagnosis Date  . Goiter   . Asthma   . Allergic rhinitis   . Depression   . Goiter   . NWGNFAOZ(308.6Headache(784.0)    Current Outpatient Prescriptions on File Prior to Visit  Medication Sig Dispense Refill  . Calcium Carbonate-Vitamin D (CALCIUM 600+D) 600-400 MG-UNIT per tablet Take 1 tablet by mouth daily.       . clotrimazole-betamethasone (LOTRISONE) cream Apply topically 2 (two) times daily.  30 g  0  . estradiol (CLIMARA - DOSED IN MG/24 HR) 0.025 mg/24hr patch PLACE 1 PATCH ONTO THE SKIN ONCE A WEEK  12 patch  3  . levothyroxine (SYNTHROID, LEVOTHROID) 112 MCG tablet TAKE 1 TABLET DAILY  90 tablet  1  . MAXALT 10 MG tablet TAKE 1 TABLET ONCE A DAY FOR MIGRAINES  30 tablet  0  . omeprazole (PRILOSEC) 20 MG capsule TAKE 1 CAPSULE DAILY  90 capsule  1  . PROAIR HFA 108 (90 BASE) MCG/ACT inhaler INHALE 2 PUFFS INTO THE LUNGS EVERY 6 HOURS AS NEEDED FOR WHEEZING  7 each  1  . propranolol ER (INDERAL LA) 60 MG 24 hr capsule TAKE 1 CAPSULE DAILY  90 capsule  1  . topiramate (TOPAMAX) 50 MG tablet TAKE 1 TABLET TWICE A DAY  180 tablet  1  . fluticasone (FLONASE) 50 MCG/ACT nasal spray Place 2 sprays into the nose daily.  16 g  6   No current facility-administered medications on file prior to visit.   Allergies  Allergen Reactions  .  Sulfamethoxazole-Trimethoprim Swelling  . Metronidazole Itching and Rash   History   Social History  . Marital Status: Married    Spouse Name: N/A    Number of Children: N/A  . Years of Education: N/A   Occupational History  . housewife    Social History Main Topics  . Smoking status: Passive Smoke Exposure - Never Smoker  . Smokeless tobacco: Never Used     Comment: husband smoking cigars  . Alcohol Use: Yes     Comment: occ  . Drug Use: No  . Sexual Activity: Not on file   Other Topics Concern  . Not on file   Social History Narrative  . No narrative on file      Review of Systems  All other systems reviewed and are negative.       Objective:   Physical Exam  Vitals reviewed. Constitutional: She is oriented to person, place, and time.  Cardiovascular: Normal rate, regular rhythm and normal heart sounds.   Pulmonary/Chest: Effort normal and breath sounds normal.  Neurological: She is alert and oriented to person, place, and time. She has normal reflexes. No cranial nerve deficit.  Psychiatric: She has a normal mood and affect. Her behavior is normal. Judgment and thought content normal.  Assessment & Plan:  1. Insomnia Begin trazodone 50 mg by mouth each bedtime. Also recommended changes in her sleep hygiene. I recommended no stimulating activities in the bedroom. This is the patient should refrain from watching TV, talking on the telephone, working on computers, cell phones, or tablet while in the bed. I recommended discontinuation of caffeine. I recommended sleeping in a cool room with white noise in the background.  If this is unsuccessful I would increase trazodone to 100 mg. If this is unsuccessful I would try the patient on elavil. - traZODone (DESYREL) 50 MG tablet; Take 1 tablet (50 mg total) by mouth at bedtime as needed for sleep.  Dispense: 30 tablet; Refill: 3

## 2013-07-13 ENCOUNTER — Telehealth: Payer: Self-pay | Admitting: Family Medicine

## 2013-07-13 NOTE — Telephone Encounter (Signed)
FYI: Pt called and wanted you to know that the Trazodone is working great.

## 2013-07-13 NOTE — Telephone Encounter (Signed)
Great, be glad to refill when necessary.

## 2013-08-18 ENCOUNTER — Other Ambulatory Visit: Payer: Self-pay | Admitting: Family Medicine

## 2013-10-05 ENCOUNTER — Other Ambulatory Visit: Payer: Self-pay | Admitting: Family Medicine

## 2013-10-16 ENCOUNTER — Other Ambulatory Visit: Payer: Self-pay | Admitting: Family Medicine

## 2013-10-16 NOTE — Telephone Encounter (Signed)
Refill appropriate and filled per protocol. 

## 2013-10-26 ENCOUNTER — Ambulatory Visit (INDEPENDENT_AMBULATORY_CARE_PROVIDER_SITE_OTHER): Admitting: Family Medicine

## 2013-10-26 ENCOUNTER — Encounter: Payer: Self-pay | Admitting: Family Medicine

## 2013-10-26 VITALS — BP 140/90 | HR 84 | Temp 97.8°F | Resp 18 | Ht 66.0 in | Wt 209.0 lb

## 2013-10-26 DIAGNOSIS — R1011 Right upper quadrant pain: Secondary | ICD-10-CM

## 2013-10-26 NOTE — Progress Notes (Signed)
Subjective:    Patient ID: Brooke Schneider, female    DOB: 10/24/1950, 63 y.o.   MRN: [id number]  HPI Patient has had one week of intermittent right upper quadrant abdominal pain. It radiates from her right upper quadrant around her ribs into her back. It is exacerbated by food. It comes and goes spontaneously. It is alleviated only by Tylenol. The pain can be intense at times. At present she is not tender at all. She denies any fever or jaundice. She denies any melena or hematochezia. She denies any hematuria or dysuria. She denies any nausea vomiting. She does report constipation. Past Medical History  Diagnosis Date  . Goiter   . Asthma   . Allergic rhinitis   . Depression   . Goiter   . WUJWJXBJ(478.2Headache(784.0)    Current Outpatient Prescriptions on File Prior to Visit  Medication Sig Dispense Refill  . buPROPion (WELLBUTRIN XL) 150 MG 24 hr tablet TAKE 1 TABLET DAILY  90 tablet  3  . Calcium Carbonate-Vitamin D (CALCIUM 600+D) 600-400 MG-UNIT per tablet Take 1 tablet by mouth daily.       . clotrimazole-betamethasone (LOTRISONE) cream Apply topically 2 (two) times daily.  30 g  0  . estradiol (CLIMARA - DOSED IN MG/24 HR) 0.025 mg/24hr patch PLACE 1 PATCH ONTO THE SKIN ONCE A WEEK  12 patch  3  . fluticasone (FLONASE) 50 MCG/ACT nasal spray Place 2 sprays into the nose daily.  16 g  6  . levothyroxine (SYNTHROID, LEVOTHROID) 112 MCG tablet TAKE 1 TABLET DAILY  90 tablet  3  . MAXALT 10 MG tablet TAKE 1 TABLET ONCE DAILY FOR MIGRAINES  90 tablet  0  . omeprazole (PRILOSEC) 20 MG capsule TAKE 1 CAPSULE DAILY  90 capsule  3  . PROAIR HFA 108 (90 BASE) MCG/ACT inhaler INHALE 2 PUFFS INTO THE LUNGS EVERY 6 HOURS AS NEEDED FOR WHEEZING  7 each  1  . propranolol ER (INDERAL LA) 60 MG 24 hr capsule TAKE 1 CAPSULE DAILY  90 capsule  3  . topiramate (TOPAMAX) 50 MG tablet TAKE 1 TABLET TWICE A DAY  180 tablet  3  . traZODone (DESYREL) 50 MG tablet Take 1 tablet (50 mg total) by mouth at bedtime as  needed for sleep.  30 tablet  3   No current facility-administered medications on file prior to visit.   .pah Allergies  Allergen Reactions  . Sulfamethoxazole-Trimethoprim Swelling  . Metronidazole Itching and Rash   History   Social History  . Marital Status: Married    Spouse Name: N/A    Number of Children: N/A  . Years of Education: N/A   Occupational History  . housewife    Social History Main Topics  . Smoking status: Passive Smoke Exposure - Never Smoker  . Smokeless tobacco: Never Used     Comment: husband smoking cigars  . Alcohol Use: Yes     Comment: occ  . Drug Use: No  . Sexual Activity: Not on file   Other Topics Concern  . Not on file   Social History Narrative  . No narrative on file      Review of Systems  All other systems reviewed and are negative.      Objective:   Physical Exam  Vitals reviewed. Cardiovascular: Normal rate, regular rhythm and normal heart sounds.   No murmur heard. Pulmonary/Chest: Effort normal and breath sounds normal. No respiratory distress. She has no wheezes. She has no  rales. She exhibits no tenderness.  Abdominal: Soft. Bowel sounds are normal. She exhibits no distension. There is no tenderness. There is no rebound and no guarding.          Assessment & Plan:  1. RUQ abdominal pain Symptoms sound like biliary colic. I recommended a right upper quadrant ultrasound to evaluate for cholelithiasis. If present, consultation with surgeon.  If pain worsens over the weekend or becomes constant high recommend going to the emergency room for an urgent ultrasound. Also recommended patient start taking MiraLax on a daily basis as needed for constipation as it is possible that constipation could be causing some of her right quadrant pain as well. - US Abdomen Limited RUQ; Future

## 2013-11-01 ENCOUNTER — Encounter (HOSPITAL_COMMUNITY): Payer: Self-pay | Admitting: Emergency Medicine

## 2013-11-01 DIAGNOSIS — J45909 Unspecified asthma, uncomplicated: Secondary | ICD-10-CM | POA: Insufficient documentation

## 2013-11-01 DIAGNOSIS — F329 Major depressive disorder, single episode, unspecified: Secondary | ICD-10-CM | POA: Insufficient documentation

## 2013-11-01 DIAGNOSIS — J309 Allergic rhinitis, unspecified: Secondary | ICD-10-CM | POA: Insufficient documentation

## 2013-11-01 DIAGNOSIS — K8000 Calculus of gallbladder with acute cholecystitis without obstruction: Principal | ICD-10-CM | POA: Insufficient documentation

## 2013-11-01 DIAGNOSIS — F17200 Nicotine dependence, unspecified, uncomplicated: Secondary | ICD-10-CM | POA: Insufficient documentation

## 2013-11-01 DIAGNOSIS — K8010 Calculus of gallbladder with chronic cholecystitis without obstruction: Secondary | ICD-10-CM | POA: Insufficient documentation

## 2013-11-01 DIAGNOSIS — E049 Nontoxic goiter, unspecified: Secondary | ICD-10-CM | POA: Insufficient documentation

## 2013-11-01 DIAGNOSIS — F3289 Other specified depressive episodes: Secondary | ICD-10-CM | POA: Insufficient documentation

## 2013-11-01 LAB — CBC WITH DIFFERENTIAL/PLATELET
Basophils Absolute: 0 10*3/uL (ref 0.0–0.1)
Basophils Relative: 0 % (ref 0–1)
Eosinophils Absolute: 0.2 10*3/uL (ref 0.0–0.7)
Eosinophils Relative: 3 % (ref 0–5)
HCT: 39.2 % (ref 36.0–46.0)
HEMOGLOBIN: 13 g/dL (ref 12.0–15.0)
Lymphocytes Relative: 40 % (ref 12–46)
Lymphs Abs: 2.7 10*3/uL (ref 0.7–4.0)
MCH: 30.7 pg (ref 26.0–34.0)
MCHC: 33.2 g/dL (ref 30.0–36.0)
MCV: 92.7 fL (ref 78.0–100.0)
Monocytes Absolute: 0.4 10*3/uL (ref 0.1–1.0)
Monocytes Relative: 6 % (ref 3–12)
NEUTROS ABS: 3.4 10*3/uL (ref 1.7–7.7)
Neutrophils Relative %: 51 % (ref 43–77)
Platelets: 297 10*3/uL (ref 150–400)
RBC: 4.23 MIL/uL (ref 3.87–5.11)
RDW: 14 % (ref 11.5–15.5)
WBC: 6.7 10*3/uL (ref 4.0–10.5)

## 2013-11-01 LAB — COMPREHENSIVE METABOLIC PANEL
ALT: 17 U/L (ref 0–35)
AST: 17 U/L (ref 0–37)
Albumin: 3.8 g/dL (ref 3.5–5.2)
Alkaline Phosphatase: 66 U/L (ref 39–117)
BUN: 11 mg/dL (ref 6–23)
CALCIUM: 9.3 mg/dL (ref 8.4–10.5)
CO2: 25 mEq/L (ref 19–32)
CREATININE: 0.84 mg/dL (ref 0.50–1.10)
Chloride: 103 mEq/L (ref 96–112)
GFR, EST AFRICAN AMERICAN: 84 mL/min — AB (ref >90)
GFR, EST NON AFRICAN AMERICAN: 72 mL/min — AB (ref >90)
GLUCOSE: 87 mg/dL (ref 70–99)
Potassium: 3.9 mEq/L (ref 3.7–5.3)
SODIUM: 141 meq/L (ref 137–147)
TOTAL PROTEIN: 8.4 g/dL — AB (ref 6.0–8.3)
Total Bilirubin: 0.2 mg/dL — ABNORMAL LOW (ref 0.3–1.2)

## 2013-11-01 LAB — LIPASE, BLOOD: LIPASE: 25 U/L (ref 11–59)

## 2013-11-01 MED ORDER — OXYCODONE-ACETAMINOPHEN 5-325 MG PO TABS
1.0000 | ORAL_TABLET | Freq: Once | ORAL | Status: AC
  Administered 2013-11-01: 1 via ORAL
  Filled 2013-11-01: qty 1

## 2013-11-01 MED ORDER — ONDANSETRON 4 MG PO TBDP
4.0000 mg | ORAL_TABLET | Freq: Once | ORAL | Status: AC
  Administered 2013-11-01: 4 mg via ORAL
  Filled 2013-11-01: qty 1

## 2013-11-01 NOTE — ED Notes (Signed)
Pt in c/o right sided abdominal pain x1 week, symptoms are intermittent but over the last few days have been occuring more frequently, denies n/v, went to PCP and they want her evaluated for gallbladder problems, pain is worse after eating, no distress noted

## 2013-11-02 ENCOUNTER — Encounter (HOSPITAL_COMMUNITY): Payer: Self-pay | Admitting: Certified Registered"

## 2013-11-02 ENCOUNTER — Encounter (HOSPITAL_COMMUNITY)
Admission: EM | Disposition: A | Discharge status: Home / Self Care | Payer: Self-pay | Source: Home / Self Care | Attending: Emergency Medicine

## 2013-11-02 ENCOUNTER — Observation Stay (HOSPITAL_COMMUNITY)
Admission: EM | Admit: 2013-11-02 | Discharge: 2013-11-03 | Disposition: A | Discharge status: Home / Self Care | Attending: General Surgery | Admitting: General Surgery

## 2013-11-02 ENCOUNTER — Observation Stay (HOSPITAL_COMMUNITY): Admitting: Certified Registered"

## 2013-11-02 ENCOUNTER — Encounter (HOSPITAL_COMMUNITY): Admitting: Certified Registered"

## 2013-11-02 ENCOUNTER — Emergency Department (HOSPITAL_COMMUNITY)

## 2013-11-02 DIAGNOSIS — K8020 Calculus of gallbladder without cholecystitis without obstruction: Secondary | ICD-10-CM

## 2013-11-02 DIAGNOSIS — R1011 Right upper quadrant pain: Secondary | ICD-10-CM

## 2013-11-02 DIAGNOSIS — K8000 Calculus of gallbladder with acute cholecystitis without obstruction: Secondary | ICD-10-CM | POA: Diagnosis present

## 2013-11-02 DIAGNOSIS — K8010 Calculus of gallbladder with chronic cholecystitis without obstruction: Secondary | ICD-10-CM

## 2013-11-02 HISTORY — PX: CHOLECYSTECTOMY: SHX55

## 2013-11-02 LAB — URINALYSIS, ROUTINE W REFLEX MICROSCOPIC
Bilirubin Urine: NEGATIVE
Glucose, UA: NEGATIVE mg/dL
Hgb urine dipstick: NEGATIVE
Ketones, ur: NEGATIVE mg/dL
NITRITE: NEGATIVE
PROTEIN: NEGATIVE mg/dL
SPECIFIC GRAVITY, URINE: 1.02 (ref 1.005–1.030)
UROBILINOGEN UA: 0.2 mg/dL (ref 0.0–1.0)
pH: 5.5 (ref 5.0–8.0)

## 2013-11-02 LAB — SURGICAL PCR SCREEN
MRSA, PCR: NEGATIVE
STAPHYLOCOCCUS AUREUS: NEGATIVE

## 2013-11-02 LAB — URINE MICROSCOPIC-ADD ON

## 2013-11-02 SURGERY — LAPAROSCOPIC CHOLECYSTECTOMY WITH INTRAOPERATIVE CHOLANGIOGRAM
Anesthesia: General | Site: Abdomen

## 2013-11-02 MED ORDER — NEOSTIGMINE METHYLSULFATE 10 MG/10ML IV SOLN
INTRAVENOUS | Status: AC
  Filled 2013-11-02: qty 1

## 2013-11-02 MED ORDER — ONDANSETRON HCL 4 MG/2ML IJ SOLN
4.0000 mg | Freq: Four times a day (QID) | INTRAMUSCULAR | Status: DC | PRN
  Administered 2013-11-02: 4 mg via INTRAVENOUS
  Filled 2013-11-02: qty 2

## 2013-11-02 MED ORDER — PROPRANOLOL HCL ER 60 MG PO CP24
60.0000 mg | ORAL_CAPSULE | Freq: Every day | ORAL | Status: DC
  Administered 2013-11-02: 60 mg via ORAL
  Filled 2013-11-02 (×2): qty 1

## 2013-11-02 MED ORDER — ONDANSETRON HCL 4 MG/2ML IJ SOLN
4.0000 mg | Freq: Once | INTRAMUSCULAR | Status: AC
  Administered 2013-11-02: 4 mg via INTRAVENOUS
  Filled 2013-11-02: qty 2

## 2013-11-02 MED ORDER — ALBUTEROL SULFATE (2.5 MG/3ML) 0.083% IN NEBU: 3.0000 mL | INHALATION_SOLUTION | Freq: Four times a day (QID) | RESPIRATORY_TRACT | Status: DC | PRN

## 2013-11-02 MED ORDER — OXYCODONE-ACETAMINOPHEN 5-325 MG PO TABS
1.0000 | ORAL_TABLET | ORAL | Status: DC | PRN
  Administered 2013-11-02: 2 via ORAL
  Filled 2013-11-02: qty 2

## 2013-11-02 MED ORDER — BUPIVACAINE-EPINEPHRINE (PF) 0.25% -1:200000 IJ SOLN
INTRAMUSCULAR | Status: AC
  Filled 2013-11-02: qty 30

## 2013-11-02 MED ORDER — LIDOCAINE HCL (CARDIAC) 20 MG/ML IV SOLN
INTRAVENOUS | Status: DC | PRN
  Administered 2013-11-02: 80 mg via INTRAVENOUS

## 2013-11-02 MED ORDER — FLUTICASONE PROPIONATE 50 MCG/ACT NA SUSP
2.0000 | Freq: Every day | NASAL | Status: DC
  Administered 2013-11-03: 2 via NASAL
  Filled 2013-11-02: qty 16

## 2013-11-02 MED ORDER — GLYCOPYRROLATE 0.2 MG/ML IJ SOLN
INTRAMUSCULAR | Status: AC
  Filled 2013-11-02: qty 3

## 2013-11-02 MED ORDER — 0.9 % SODIUM CHLORIDE (POUR BTL) OPTIME
TOPICAL | Status: DC | PRN
  Administered 2013-11-02: 1000 mL

## 2013-11-02 MED ORDER — BUPIVACAINE-EPINEPHRINE 0.25% -1:200000 IJ SOLN
INTRAMUSCULAR | Status: DC | PRN
  Administered 2013-11-02: 16 mL

## 2013-11-02 MED ORDER — ONDANSETRON HCL 4 MG/2ML IJ SOLN
4.0000 mg | Freq: Once | INTRAMUSCULAR | Status: DC | PRN
Start: 2013-11-02 — End: 2013-11-02

## 2013-11-02 MED ORDER — DEXTROSE 5 % IV SOLN
2.0000 g | INTRAVENOUS | Status: AC
  Administered 2013-11-02: 2 g via INTRAVENOUS
  Filled 2013-11-02 (×3): qty 2

## 2013-11-02 MED ORDER — PROPOFOL 10 MG/ML IV BOLUS
INTRAVENOUS | Status: DC | PRN
  Administered 2013-11-02: 200 mg via INTRAVENOUS

## 2013-11-02 MED ORDER — ROCURONIUM BROMIDE 100 MG/10ML IV SOLN
INTRAVENOUS | Status: DC | PRN
  Administered 2013-11-02: 50 mg via INTRAVENOUS

## 2013-11-02 MED ORDER — LEVOTHYROXINE SODIUM 112 MCG PO TABS
112.0000 ug | ORAL_TABLET | Freq: Every day | ORAL | Status: DC
  Administered 2013-11-03: 112 ug via ORAL
  Filled 2013-11-02 (×2): qty 1

## 2013-11-02 MED ORDER — OXYCODONE HCL 5 MG/5ML PO SOLN: 5.0000 mg | Freq: Once | ORAL | Status: DC | PRN

## 2013-11-02 MED ORDER — ARTIFICIAL TEARS OP OINT
TOPICAL_OINTMENT | OPHTHALMIC | Status: AC
  Filled 2013-11-02: qty 3.5

## 2013-11-02 MED ORDER — PANTOPRAZOLE SODIUM 40 MG PO TBEC
40.0000 mg | DELAYED_RELEASE_TABLET | Freq: Every day | ORAL | Status: DC
Start: 2013-11-02 — End: 2013-11-03
  Administered 2013-11-03: 40 mg via ORAL
  Filled 2013-11-02: qty 1

## 2013-11-02 MED ORDER — TOPIRAMATE 25 MG PO TABS
50.0000 mg | ORAL_TABLET | Freq: Every day | ORAL | Status: DC
  Administered 2013-11-02: 50 mg via ORAL
  Filled 2013-11-02 (×2): qty 2

## 2013-11-02 MED ORDER — PHENYLEPHRINE 40 MCG/ML (10ML) SYRINGE FOR IV PUSH (FOR BLOOD PRESSURE SUPPORT)
PREFILLED_SYRINGE | INTRAVENOUS | Status: AC
  Filled 2013-11-02: qty 10

## 2013-11-02 MED ORDER — NEOSTIGMINE METHYLSULFATE 10 MG/10ML IV SOLN
INTRAVENOUS | Status: DC | PRN
  Administered 2013-11-02: 4 mg via INTRAVENOUS

## 2013-11-02 MED ORDER — ARTIFICIAL TEARS OP OINT
TOPICAL_OINTMENT | OPHTHALMIC | Status: DC | PRN
  Administered 2013-11-02: 1 via OPHTHALMIC

## 2013-11-02 MED ORDER — LIDOCAINE HCL (CARDIAC) 20 MG/ML IV SOLN
INTRAVENOUS | Status: AC
  Filled 2013-11-02: qty 5

## 2013-11-02 MED ORDER — ENOXAPARIN SODIUM 40 MG/0.4ML ~~LOC~~ SOLN: 40.0000 mg | SUBCUTANEOUS | Status: DC

## 2013-11-02 MED ORDER — ONDANSETRON HCL 4 MG/2ML IJ SOLN
INTRAMUSCULAR | Status: AC
  Filled 2013-11-02: qty 2

## 2013-11-02 MED ORDER — ROCURONIUM BROMIDE 50 MG/5ML IV SOLN
INTRAVENOUS | Status: AC
  Filled 2013-11-02: qty 1

## 2013-11-02 MED ORDER — SODIUM CHLORIDE 0.9 % IR SOLN
Status: DC | PRN
  Administered 2013-11-02: 1000 mL

## 2013-11-02 MED ORDER — GLYCOPYRROLATE 0.2 MG/ML IJ SOLN
INTRAMUSCULAR | Status: AC
  Filled 2013-11-02: qty 4

## 2013-11-02 MED ORDER — MIDAZOLAM HCL 2 MG/2ML IJ SOLN
INTRAMUSCULAR | Status: AC
  Filled 2013-11-02: qty 2

## 2013-11-02 MED ORDER — GLYCOPYRROLATE 0.2 MG/ML IJ SOLN
INTRAMUSCULAR | Status: DC | PRN
  Administered 2013-11-02: 0.6 mg via INTRAVENOUS

## 2013-11-02 MED ORDER — HYDROMORPHONE HCL PF 1 MG/ML IJ SOLN: 0.2500 mg | INTRAMUSCULAR | Status: DC | PRN

## 2013-11-02 MED ORDER — KCL IN DEXTROSE-NACL 20-5-0.45 MEQ/L-%-% IV SOLN
INTRAVENOUS | Status: DC
  Administered 2013-11-02 – 2013-11-03 (×2): via INTRAVENOUS
  Filled 2013-11-02 (×4): qty 1000

## 2013-11-02 MED ORDER — FENTANYL CITRATE 0.05 MG/ML IJ SOLN
INTRAMUSCULAR | Status: AC
  Filled 2013-11-02: qty 5

## 2013-11-02 MED ORDER — LACTATED RINGERS IV SOLN
INTRAVENOUS | Status: DC
  Administered 2013-11-02 (×2): via INTRAVENOUS

## 2013-11-02 MED ORDER — MIDAZOLAM HCL 5 MG/5ML IJ SOLN
INTRAMUSCULAR | Status: DC | PRN
  Administered 2013-11-02: 2 mg via INTRAVENOUS

## 2013-11-02 MED ORDER — FAMOTIDINE IN NACL 20-0.9 MG/50ML-% IV SOLN
20.0000 mg | Freq: Once | INTRAVENOUS | Status: AC
  Administered 2013-11-02: 20 mg via INTRAVENOUS
  Filled 2013-11-02: qty 50

## 2013-11-02 MED ORDER — HYDROMORPHONE HCL PF 1 MG/ML IJ SOLN
1.0000 mg | INTRAMUSCULAR | Status: DC | PRN
  Administered 2013-11-02 (×2): 1 mg via INTRAVENOUS
  Filled 2013-11-02 (×2): qty 1

## 2013-11-02 MED ORDER — FENTANYL CITRATE 0.05 MG/ML IJ SOLN
INTRAMUSCULAR | Status: DC | PRN
  Administered 2013-11-02 (×2): 100 ug via INTRAVENOUS
  Administered 2013-11-02 (×3): 50 ug via INTRAVENOUS

## 2013-11-02 MED ORDER — PROPOFOL 10 MG/ML IV BOLUS
INTRAVENOUS | Status: AC
  Filled 2013-11-02: qty 20

## 2013-11-02 MED ORDER — MORPHINE SULFATE 4 MG/ML IJ SOLN
4.0000 mg | Freq: Once | INTRAMUSCULAR | Status: AC
  Administered 2013-11-02: 4 mg via INTRAVENOUS
  Filled 2013-11-02: qty 1

## 2013-11-02 MED ORDER — PHENYLEPHRINE HCL 10 MG/ML IJ SOLN
INTRAMUSCULAR | Status: DC | PRN
  Administered 2013-11-02: 120 ug via INTRAVENOUS

## 2013-11-02 MED ORDER — OXYCODONE HCL 5 MG PO TABS: 5.0000 mg | ORAL_TABLET | Freq: Once | ORAL | Status: DC | PRN

## 2013-11-02 MED ORDER — SODIUM CHLORIDE 0.9 % IV SOLN
INTRAVENOUS | Status: DC | PRN
  Administered 2013-11-02: 14:00:00

## 2013-11-02 MED ORDER — ONDANSETRON HCL 4 MG/2ML IJ SOLN
INTRAMUSCULAR | Status: DC | PRN
  Administered 2013-11-02: 4 mg via INTRAVENOUS

## 2013-11-02 MED ORDER — DEXTROSE 5 % IV SOLN
2.0000 g | Freq: Four times a day (QID) | INTRAVENOUS | Status: DC
  Administered 2013-11-02 – 2013-11-03 (×4): 2 g via INTRAVENOUS
  Filled 2013-11-02 (×6): qty 2

## 2013-11-02 SURGICAL SUPPLY — 52 items
ADH SKN CLS APL DERMABOND .7 (GAUZE/BANDAGES/DRESSINGS) ×1
APPLIER CLIP 5 13 M/L LIGAMAX5 (MISCELLANEOUS)
APPLIER CLIP ROT 10 11.4 M/L (STAPLE)
APR CLP MED LRG 11.4X10 (STAPLE)
APR CLP MED LRG 5 ANG JAW (MISCELLANEOUS)
BAG SPEC RTRVL 10 TROC 200 (ENDOMECHANICALS) ×1
BAG SPEC RTRVL LRG 6X4 10 (ENDOMECHANICALS)
BLADE SURG ROTATE 9660 (MISCELLANEOUS) IMPLANT
CANISTER SUCTION 2500CC (MISCELLANEOUS) ×2 IMPLANT
CHLORAPREP W/TINT 26ML (MISCELLANEOUS) ×2 IMPLANT
CLIP APPLIE 5 13 M/L LIGAMAX5 (MISCELLANEOUS) IMPLANT
CLIP APPLIE ROT 10 11.4 M/L (STAPLE) IMPLANT
CLSR STERI-STRIP ANTIMIC 1/2X4 (GAUZE/BANDAGES/DRESSINGS) ×1 IMPLANT
COVER MAYO STAND STRL (DRAPES) ×2 IMPLANT
COVER SURGICAL LIGHT HANDLE (MISCELLANEOUS) ×2 IMPLANT
DERMABOND ADVANCED (GAUZE/BANDAGES/DRESSINGS) ×1
DERMABOND ADVANCED .7 DNX12 (GAUZE/BANDAGES/DRESSINGS) ×1 IMPLANT
DRAPE C-ARM 42X72 X-RAY (DRAPES) ×2 IMPLANT
DRAPE UTILITY 15X26 W/TAPE STR (DRAPE) ×4 IMPLANT
DRSG TEGADERM 2-3/8X2-3/4 SM (GAUZE/BANDAGES/DRESSINGS) ×8 IMPLANT
ELECT REM PT RETURN 9FT ADLT (ELECTROSURGICAL) ×2
ELECTRODE REM PT RTRN 9FT ADLT (ELECTROSURGICAL) ×1 IMPLANT
GLOVE BIO SURGEON STRL SZ8 (GLOVE) ×1 IMPLANT
GLOVE BIOGEL PI IND STRL 7.0 (GLOVE) IMPLANT
GLOVE BIOGEL PI IND STRL 7.5 (GLOVE) IMPLANT
GLOVE BIOGEL PI IND STRL 8 (GLOVE) ×1 IMPLANT
GLOVE BIOGEL PI INDICATOR 7.0 (GLOVE) ×2
GLOVE BIOGEL PI INDICATOR 7.5 (GLOVE) ×1
GLOVE BIOGEL PI INDICATOR 8 (GLOVE) ×2
GLOVE ECLIPSE 7.5 STRL STRAW (GLOVE) ×3 IMPLANT
GOWN STRL REUS W/ TWL LRG LVL3 (GOWN DISPOSABLE) ×3 IMPLANT
GOWN STRL REUS W/TWL 2XL LVL3 (GOWN DISPOSABLE) ×1 IMPLANT
GOWN STRL REUS W/TWL LRG LVL3 (GOWN DISPOSABLE) ×8
KIT BASIN OR (CUSTOM PROCEDURE TRAY) ×2 IMPLANT
KIT ROOM TURNOVER OR (KITS) ×2 IMPLANT
NS IRRIG 1000ML POUR BTL (IV SOLUTION) ×2 IMPLANT
PAD ARMBOARD 7.5X6 YLW CONV (MISCELLANEOUS) ×2 IMPLANT
POUCH RETRIEVAL ECOSAC 10 (ENDOMECHANICALS) IMPLANT
POUCH RETRIEVAL ECOSAC 10MM (ENDOMECHANICALS) ×1
POUCH SPECIMEN RETRIEVAL 10MM (ENDOMECHANICALS) IMPLANT
SCISSORS LAP 5X35 DISP (ENDOMECHANICALS) ×2 IMPLANT
SET CHOLANGIOGRAPH 5 50 .035 (SET/KITS/TRAYS/PACK) ×2 IMPLANT
SET IRRIG TUBING LAPAROSCOPIC (IRRIGATION / IRRIGATOR) ×2 IMPLANT
SLEEVE ENDOPATH XCEL 5M (ENDOMECHANICALS) ×2 IMPLANT
SPECIMEN JAR SMALL (MISCELLANEOUS) ×2 IMPLANT
SUT MNCRL AB 4-0 PS2 18 (SUTURE) ×3 IMPLANT
TOWEL OR 17X24 6PK STRL BLUE (TOWEL DISPOSABLE) ×2 IMPLANT
TOWEL OR 17X26 10 PK STRL BLUE (TOWEL DISPOSABLE) ×2 IMPLANT
TRAY LAPAROSCOPIC (CUSTOM PROCEDURE TRAY) ×2 IMPLANT
TROCAR XCEL BLUNT TIP 100MML (ENDOMECHANICALS) ×2 IMPLANT
TROCAR XCEL NON-BLD 11X100MML (ENDOMECHANICALS) IMPLANT
TROCAR XCEL NON-BLD 5MMX100MML (ENDOMECHANICALS) ×2 IMPLANT

## 2013-11-02 NOTE — ED Notes (Signed)
MD at bedside. 

## 2013-11-02 NOTE — Anesthesia Preprocedure Evaluation (Addendum)
Anesthesia Evaluation  Patient identified by MRN, date of birth, ID band Patient awake    Reviewed: Allergy & Precautions, H&P , NPO status , Patient's Chart, lab work & pertinent test results  Airway Mallampati: II TM Distance: >3 FB Neck ROM: Full    Dental  (+) Partial Upper, Dental Advisory Given, Teeth Intact   Pulmonary asthma ,  breath sounds clear to auscultation        Cardiovascular Rhythm:Regular Rate:Normal     Neuro/Psych  Headaches, Depression    GI/Hepatic   Endo/Other    Renal/GU      Musculoskeletal   Abdominal   Peds  Hematology   Anesthesia Other Findings   Reproductive/Obstetrics                          Anesthesia Physical Anesthesia Plan  ASA: II  Anesthesia Plan: General   Post-op Pain Management:    Induction: Intravenous  Airway Management Planned: Oral ETT  Additional Equipment:   Intra-op Plan:   Post-operative Plan: Extubation in OR  Informed Consent: I have reviewed the patients History and Physical, chart, labs and discussed the procedure including the risks, benefits and alternatives for the proposed anesthesia with the patient or authorized representative who has indicated his/her understanding and acceptance.   Dental advisory given  Plan Discussed with: CRNA, Anesthesiologist and Surgeon  Anesthesia Plan Comments:         Anesthesia Quick Evaluation

## 2013-11-02 NOTE — ED Provider Notes (Signed)
CSN: [id number]     Arrival date & time 11/01/13  1755 History   First MD Initiated Contact with Patient 11/02/13 0107     Chief Complaint  Patient presents with  . Abdominal Pain     (Consider location/radiation/quality/duration/timing/severity/associated sxs/prior Treatment) HPI Comments: Pt comes in with cc of abd pain. Pain started mid may - and the pain is described as initially intermittent - now constant, and is on the right side, and upper quadrants, sharp and throbbing in nature. No specific aggravating or relieving factors. ? Association with po intake. Mild nausea, no emesis, no diarrhea. No uti like sx, no hx of pud.  Pt has hx of hysterectomy. No hx of similar pain.  Patient is a 63 y.o. female presenting with abdominal pain. The history is provided by the patient.  Abdominal Pain   Past Medical History  Diagnosis Date  . Goiter   . Asthma   . Allergic rhinitis   . Depression   . Goiter   . DSKAJGOT(157.2)    Past Surgical History  Procedure Laterality Date  . Knee surgery    . Thyroidectomy    . Abdominal hysterectomy     Family History  Problem Relation Age of Onset  . Allergies Mother   . ALS Father   . Prostate cancer Father    History  Substance Use Topics  . Smoking status: Passive Smoke Exposure - Never Smoker  . Smokeless tobacco: Never Used     Comment: husband smoking cigars  . Alcohol Use: Yes     Comment: occ   OB History   Grav Para Term Preterm Abortions TAB SAB Ect Mult Living                 Review of Systems  Gastrointestinal: Positive for abdominal pain.      Allergies  Sulfamethoxazole-trimethoprim and Metronidazole  Home Medications   Prior to Admission medications   Medication Sig Start Date End Date Taking? Authorizing Provider  buPROPion (WELLBUTRIN XL) 150 MG 24 hr tablet TAKE 1 TABLET DAILY 10/05/13  Yes Donita Brooks, MD  Calcium Carbonate-Vitamin D (CALCIUM 600+D) 600-400 MG-UNIT per tablet Take 1 tablet by  mouth daily.    Yes Historical Provider, MD  clotrimazole-betamethasone (LOTRISONE) cream Apply topically 2 (two) times daily. 03/22/13  Yes Donita Brooks, MD  estradiol (CLIMARA - DOSED IN MG/24 HR) 0.025 mg/24hr patch PLACE 1 PATCH ONTO THE SKIN ONCE A WEEK 05/06/13  Yes Donita Brooks, MD  fluticasone [facility]) 50 MCG/ACT nasal spray Place 2 sprays into the nose daily. 03/22/13  Yes Donita Brooks, MD  glucosamine-chondroitin 500-400 MG tablet Take 1 tablet by mouth 3 (three) times daily.   Yes Historical Provider, MD  levothyroxine (SYNTHROID, LEVOTHROID) 112 MCG tablet TAKE 1 TABLET DAILY 10/05/13  Yes Donita Brooks, MD  MAXALT 10 MG tablet TAKE 1 TABLET ONCE DAILY FOR MIGRAINES 10/16/13  Yes Donita Brooks, MD  omeprazole (PRILOSEC) 20 MG capsule TAKE 1 CAPSULE DAILY 10/05/13  Yes Donita Brooks, MD  PROAIR HFA 108 (90 BASE) MCG/ACT inhaler INHALE 2 PUFFS INTO THE LUNGS EVERY 6 HOURS AS NEEDED FOR WHEEZING 05/12/13  Yes Donita Brooks, MD  propranolol ER (INDERAL LA) 60 MG 24 hr capsule TAKE 1 CAPSULE DAILY 10/05/13  Yes Donita Brooks, MD  topiramate (TOPAMAX) 50 MG tablet TAKE 1 TABLET TWICE A DAY 10/05/13  Yes Donita Brooks, MD  traZODone (DESYREL) 50 MG tablet Take 1  tablet (50 mg total) by mouth at bedtime as needed for sleep. 07/06/13  Yes Donita BrooksWarren T Pickard, MD   BP 135/84  Pulse 62  Temp(Src) 97.2 F (36.2 C) (Oral)  Resp 16  Wt 209 lb (94.802 kg)  SpO2 96% Physical Exam  ED Course  Procedures (including critical care time) Labs Review Labs Reviewed  COMPREHENSIVE METABOLIC PANEL - Abnormal; Notable for the following:    Total Protein 8.4 (*)    Total Bilirubin 0.2 (*)    GFR calc non Af Amer 72 (*)    GFR calc Af Amer 84 (*)    All other components within normal limits  URINALYSIS, ROUTINE W REFLEX MICROSCOPIC - Abnormal; Notable for the following:    Leukocytes, UA SMALL (*)    All other components within normal limits  CBC WITH DIFFERENTIAL  LIPASE, BLOOD   URINE MICROSCOPIC-ADD ON    Imaging Review [place] Abdomen Complete  11/02/2013   CLINICAL DATA:  Right upper quadrant abdominal pain.  EXAM: ULTRASOUND ABDOMEN COMPLETE  COMPARISON:  None.  FINDINGS: Gallbladder:  A large 4.0 cm stone is seen lodged at the base of the gallbladder, with diffuse gallbladder wall edema, measuring up to 9 mm in thickness. No definite pericholecystic fluid is seen. This could reflect obstruction due to the large stone. A small amount of sludge is seen within the gallbladder. No ultrasonographic Murphy's sign is elicited.  Common bile duct:  Diameter: 0.3 cm, within normal limits in caliber.  Liver:  No focal lesion identified. Diffusely increased parenchymal echogenicity and coarsened echotexture, compatible with fatty infiltration.  IVC:  No abnormality visualized.  Pancreas:  Visualized portion unremarkable.  Spleen:  Size and appearance within normal limits.  Right Kidney:  Length: 11.3 cm. Echogenicity within normal limits. No mass or hydronephrosis visualized.  Left Kidney:  Length: 11.4 cm. Echogenicity within normal limits. No mass or hydronephrosis visualized.  Abdominal aorta:  No aneurysm visualized.  Other findings:  None.  IMPRESSION: 1. Large 4.0 cm stone lodged at the base of the gallbladder, with diffuse gallbladder wall edema. No pericholecystic fluid seen. Though no ultrasonographic Murphy's sign is elicited, this could reflect obstruction due to the large stone. Small amount of sludge seen within the gallbladder. No evidence for cholecystitis. 2. Diffuse fatty infiltration within the liver.   Electronically Signed   By: Roanna RaiderJeffery  Chang M.D.   On: 11/02/2013 02:49     EKG Interpretation None      MDM   Final diagnoses:  Symptomatic cholelithiasis    DDx includes: Pancreatitis Hepatobiliary pathology including cholecystitis Gastritis/PUD SBO ACS syndrome Aortic Dissection   Pt with right sided abd pain, now constant. Worse with po intake. US  shows cholelithiasis. Her pain is tolerable, but constant - and thus she is symptomatic cholelithiasis or cholecystitis. Surgery consulted.     Derwood KaplanAnkit Aliany Fiorenza, MD 11/02/13 [phone]

## 2013-11-02 NOTE — Discharge Instructions (Signed)
Laparoscopic Cholecystectomy, Care After °Refer to this sheet in the next few weeks. These instructions provide you with information on caring for yourself after your procedure. Your health care provider may also give you more specific instructions. Your treatment has been planned according to current medical practices, but problems sometimes occur. Call your health care provider if you have any problems or questions after your procedure. °WHAT TO EXPECT AFTER THE PROCEDURE °After your procedure, it is typical to have the following: °· Pain at your incision sites. You will be given pain medicines to control the pain. °· Mild nausea or vomiting. This should improve after the first 24 hours. °· Bloating and possibly shoulder pain from the gas used during the procedure. This will improve after the first 24 hours. °HOME CARE INSTRUCTIONS  °· Change bandages (dressings) as directed by your health care provider. °· Keep the wound dry and clean. You may wash the wound gently with soap and water. Gently blot or dab the area dry. °· Do not take baths or use swimming pools or hot tubs for 2 weeks or until your health care provider approves. °· Only take over-the-counter or prescription medicines as directed by your health care provider. °· Continue your normal diet as directed by your health care provider. °· Do not lift anything heavier than 10 pounds (4.5 kg) until your health care provider approves. °· Do not play contact sports for 1 week or until your health care provider approves. °SEEK MEDICAL CARE IF:  °· You have redness, swelling, or increasing pain in the wound. °· You notice yellowish-white fluid (pus) coming from the wound. °· You have drainage from the wound that lasts longer than 1 day. °· You notice a bad smell coming from the wound or dressing. °· Your surgical cuts (incisions) break open. °SEEK IMMEDIATE MEDICAL CARE IF:  °· You develop a rash. °· You have difficulty breathing. °· You have chest pain. °· You  have a fever. °· You have increasing pain in the shoulders (shoulder strap areas). °· You have dizzy episodes or faint while standing. °· You have severe abdominal pain. °· You feel sick to your stomach (nauseous) or throw up (vomit) and this lasts for more than 1 day. °Document Released: 05/24/2005 Document Revised: 03/14/2013 Document Reviewed: 01/03/2013 °ExitCare® Patient Information ©2014 [employer]. ° °CCS ______[facility] °LAPAROSCOPIC SURGERY: POST OP INSTRUCTIONS °Always review your discharge instruction sheet given to you by the facility where your surgery was performed. °IF YOU HAVE DISABILITY OR FAMILY LEAVE FORMS, YOU MUST BRING THEM TO THE OFFICE FOR PROCESSING.   °DO NOT GIVE THEM TO YOUR DOCTOR. ° °1. A prescription for pain medication may be given to you upon discharge.  Take your pain medication as prescribed, if needed.  If narcotic pain medicine is not needed, then you may take acetaminophen (Tylenol) or ibuprofen (Advil) as needed. °2. Take your usually prescribed medications unless otherwise directed. °3. If you need a refill on your pain medication, please contact your pharmacy.  They will contact our office to request authorization. Prescriptions will not be filled after 5pm or on week-ends. °4. You should follow a light diet the first few days after arrival home, such as soup and crackers, etc.  Be sure to include lots of fluids daily. °5. Most patients will experience some swelling and bruising in the area of the incisions.  Ice packs will help.  Swelling and bruising can take several days to resolve.  °6. It is common   to experience some constipation if taking pain medication after surgery.  Increasing fluid intake and taking a stool softener (such as Colace) will usually help or prevent this problem from occurring.  A mild laxative (Milk of Magnesia or Miralax) should be taken according to package instructions if there are no bowel movements after 48  hours. °7. Unless discharge instructions indicate otherwise, you may remove your bandages 24-48 hours after surgery, and you may shower at that time.  You may have steri-strips (small skin tapes) in place directly over the incision.  These strips should be left on the skin for 7-10 days.  If your surgeon used skin glue on the incision, you may shower in 24 hours.  The glue will flake off over the next 2-3 weeks.  Any sutures or staples will be removed at the office during your follow-up visit. °8. ACTIVITIES:  You may resume regular (light) daily activities beginning the next day--such as daily self-care, walking, climbing stairs--gradually increasing activities as tolerated.  You may have sexual intercourse when it is comfortable.  Refrain from any heavy lifting or straining until approved by your doctor. °a. You may drive when you are no longer taking prescription pain medication, you can comfortably wear a seatbelt, and you can safely maneuver your car and apply brakes. °b. RETURN TO WORK:  __________________________________________________________ °9. You should see your doctor in the office for a follow-up appointment approximately 2-3 weeks after your surgery.  Make sure that you call for this appointment within a day or two after you arrive home to insure a convenient appointment time. °10. OTHER INSTRUCTIONS: __________________________________________________________________________________________________________________________ __________________________________________________________________________________________________________________________ °WHEN TO CALL YOUR DOCTOR: °1. Fever over 101.0 °2. Inability to urinate °3. Continued bleeding from incision. °4. Increased pain, redness, or drainage from the incision. °5. Increasing abdominal pain ° °The clinic staff is available to answer your questions during regular business hours.  Please don’t hesitate to call and ask to speak to one of the nurses for  clinical concerns.  If you have a medical emergency, go to the nearest emergency room or call 911.  A surgeon from [facility] is always on call at the hospital. °[address], [address], [place], [place]  [address] ? [address] [address], [place], [place]   [address] °[phone] ? [phone] ? FAX [phone] °Web site: [email or web address] ° °

## 2013-11-02 NOTE — Transfer of Care (Signed)
Immediate Anesthesia Transfer of Care Note  Patient: Brooke Schneider  Procedure(s) Performed: Procedure(s): LAPAROSCOPIC CHOLECYSTECTOMY  (N/A)  Patient Location: PACU  Anesthesia Type:General  Level of Consciousness: lethargic and responds to stimulation  Airway & Oxygen Therapy: Patient Spontanous Breathing and Patient connected to nasal cannula oxygen  Post-op Assessment: Report given to PACU RN  Post vital signs: Reviewed and stable  Complications: No apparent anesthesia complications

## 2013-11-02 NOTE — ED Notes (Signed)
Patient transported to Ultrasound 

## 2013-11-02 NOTE — H&P (Signed)
Brooke Schneider is an 63 y.o. female.   Chief Complaint:  RUQ abdominal pain HPI: This is a 63 yo female who presents with about two weeks of worsening RUQ abdominal pain with radiation to her back and across to her LUQ.  This is associated with nausea, some vomiting, but no diarrhea.  Symptoms seem to get worse after eating.  She was evaluated by her PCP, Dr. Dennard Schaumann, who was planning on a RUQ ultrasound to rule out gallbladder disease.  However, her symptoms worsened and she presented to the ED tonight for evaluation.   Past Medical History  Diagnosis Date  . Goiter   . Asthma   . Allergic rhinitis   . Depression   . Goiter   . 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 Knee surgery    . Thyroidectomy    . Abdominal hysterectomy      Family History  Problem Relation Age of Onset  . Allergies Mother   . ALS Father   . Prostate cancer Father    Social History:  reports that she has been passively smoking.  She has never used smokeless tobacco. She reports that she drinks alcohol. She reports that she does not use illicit drugs.  Allergies:  Allergies  Allergen Reactions  . Sulfamethoxazole-Trimethoprim Swelling  . Metronidazole Itching and Rash    Prior to Admission medications   Medication Sig Start Date End Date Taking? Authorizing Provider  buPROPion (WELLBUTRIN XL) 150 MG 24 hr tablet TAKE 1 TABLET DAILY 10/05/13  Yes Susy Frizzle, MD  Calcium Carbonate-Vitamin D (CALCIUM 600+D) 600-400 MG-UNIT per tablet Take 1 tablet by mouth daily.    Yes Historical Provider, MD  clotrimazole-betamethasone (LOTRISONE) cream Apply topically 2 (two) times daily. 03/22/13  Yes Susy Frizzle, MD  estradiol (CLIMARA - DOSED IN MG/24 HR) 0.025 mg/24hr patch PLACE 1 PATCH ONTO THE SKIN ONCE A WEEK 05/06/13  Yes Susy Frizzle, MD  fluticasone [facility]) 50 MCG/ACT nasal spray Place 2 sprays into the nose daily. 03/22/13  Yes Susy Frizzle, MD   glucosamine-chondroitin 500-400 MG tablet Take 1 tablet by mouth 3 (three) times daily.   Yes Historical Provider, MD  levothyroxine (SYNTHROID, LEVOTHROID) 112 MCG tablet TAKE 1 TABLET DAILY 10/05/13  Yes Susy Frizzle, MD  MAXALT 10 MG tablet TAKE 1 TABLET ONCE DAILY FOR MIGRAINES 10/16/13  Yes Susy Frizzle, MD  omeprazole (PRILOSEC) 20 MG capsule TAKE 1 CAPSULE DAILY 10/05/13  Yes Susy Frizzle, MD  PROAIR HFA 108 (90 BASE) MCG/ACT inhaler INHALE 2 PUFFS INTO THE LUNGS EVERY 6 HOURS AS NEEDED FOR WHEEZING 05/12/13  Yes Susy Frizzle, MD  propranolol ER (INDERAL LA) 60 MG 24 hr capsule TAKE 1 CAPSULE DAILY 10/05/13  Yes Susy Frizzle, MD  topiramate (TOPAMAX) 50 MG tablet TAKE 1 TABLET TWICE A DAY 10/05/13  Yes Susy Frizzle, MD  traZODone (DESYREL) 50 MG tablet Take 1 tablet (50 mg total) by mouth at bedtime as needed for sleep. 07/06/13  Yes Susy Frizzle, MD    Results for orders placed during the hospital encounter of 11/02/13 (from the past 48 hour(s))  CBC WITH DIFFERENTIAL     Status: None   Collection Time    11/01/13  6:10 PM      Result Value Ref Range   WBC 6.7  4.0 - 10.5 K/uL   RBC 4.23  3.87 - 5.11 MIL/uL  Hemoglobin 13.0  12.0 - 15.0 g/dL   HCT 39.2  36.0 - 46.0 %   MCV 92.7  78.0 - 100.0 fL   MCH 30.7  26.0 - 34.0 pg   MCHC 33.2  30.0 - 36.0 g/dL   RDW 14.0  11.5 - 15.5 %   Platelets 297  150 - 400 K/uL   Neutrophils Relative % 51  43 - 77 %   Neutro Abs 3.4  1.7 - 7.7 K/uL   Lymphocytes Relative 40  12 - 46 %   Lymphs Abs 2.7  0.7 - 4.0 K/uL   Monocytes Relative 6  3 - 12 %   Monocytes Absolute 0.4  0.1 - 1.0 K/uL   Eosinophils Relative 3  0 - 5 %   Eosinophils Absolute 0.2  0.0 - 0.7 K/uL   Basophils Relative 0  0 - 1 %   Basophils Absolute 0.0  0.0 - 0.1 K/uL  COMPREHENSIVE METABOLIC PANEL     Status: Abnormal   Collection Time    11/01/13  6:10 PM      Result Value Ref Range   Sodium 141  137 - 147 mEq/L   Potassium 3.9  3.7 - 5.3 mEq/L    Chloride 103  96 - 112 mEq/L   CO2 25  19 - 32 mEq/L   Glucose, Bld 87  70 - 99 mg/dL   BUN 11  6 - 23 mg/dL   Creatinine, Ser 0.84  0.50 - 1.10 mg/dL   Calcium 9.3  8.4 - 10.5 mg/dL   Total Protein 8.4 (*) 6.0 - 8.3 g/dL   Albumin 3.8  3.5 - 5.2 g/dL   AST 17  0 - 37 U/L   ALT 17  0 - 35 U/L   Alkaline Phosphatase 66  39 - 117 U/L   Total Bilirubin 0.2 (*) 0.3 - 1.2 mg/dL   GFR calc non Af Amer 72 (*) >90 mL/min   GFR calc Af Amer 84 (*) >90 mL/min   Comment: (NOTE)     The eGFR has been calculated using the CKD EPI equation.     This calculation has not been validated in all clinical situations.     eGFR's persistently <90 mL/min signify possible Chronic Kidney     Disease.  LIPASE, BLOOD     Status: None   Collection Time    11/01/13  6:10 PM      Result Value Ref Range   Lipase 25  11 - 59 U/L  URINALYSIS, ROUTINE W REFLEX MICROSCOPIC     Status: Abnormal   Collection Time    11/02/13  2:06 AM      Result Value Ref Range   Color, Urine YELLOW  YELLOW   APPearance CLEAR  CLEAR   Specific Gravity, Urine 1.020  1.005 - 1.030   pH 5.5  5.0 - 8.0   Glucose, UA NEGATIVE  NEGATIVE mg/dL   Hgb urine dipstick NEGATIVE  NEGATIVE   Bilirubin Urine NEGATIVE  NEGATIVE   Ketones, ur NEGATIVE  NEGATIVE mg/dL   Protein, ur NEGATIVE  NEGATIVE mg/dL   Urobilinogen, UA 0.2  0.0 - 1.0 mg/dL   Nitrite NEGATIVE  NEGATIVE   Leukocytes, UA SMALL (*) NEGATIVE  URINE MICROSCOPIC-ADD ON     Status: None   Collection Time    11/02/13  2:06 AM      Result Value Ref Range   Squamous Epithelial / LPF RARE  RARE   WBC, UA  3-6  <3 WBC/hpf   RBC / HPF 0-2  <3 RBC/hpf   Bacteria, UA RARE  RARE   US Abdomen Complete  11/02/2013   CLINICAL DATA:  Right upper quadrant abdominal pain.  EXAM: ULTRASOUND ABDOMEN COMPLETE  COMPARISON:  None.  FINDINGS: Gallbladder:  A large 4.0 cm stone is seen lodged at the base of the gallbladder, with diffuse gallbladder wall edema, measuring up to 9 mm in thickness.  No definite pericholecystic fluid is seen. This could reflect obstruction due to the large stone. A small amount of sludge is seen within the gallbladder. No ultrasonographic Murphy's sign is elicited.  Common bile duct:  Diameter: 0.3 cm, within normal limits in caliber.  Liver:  No focal lesion identified. Diffusely increased parenchymal echogenicity and coarsened echotexture, compatible with fatty infiltration.  IVC:  No abnormality visualized.  Pancreas:  Visualized portion unremarkable.  Spleen:  Size and appearance within normal limits.  Right Kidney:  Length: 11.3 cm. Echogenicity within normal limits. No mass or hydronephrosis visualized.  Left Kidney:  Length: 11.4 cm. Echogenicity within normal limits. No mass or hydronephrosis visualized.  Abdominal aorta:  No aneurysm visualized.  Other findings:  None.  IMPRESSION: 1. Large 4.0 cm stone lodged at the base of the gallbladder, with diffuse gallbladder wall edema. No pericholecystic fluid seen. Though no ultrasonographic Murphy's sign is elicited, this could reflect obstruction due to the large stone. Small amount of sludge seen within the gallbladder. No evidence for cholecystitis. 2. Diffuse fatty infiltration within the liver.   Electronically Signed   By: Garald Balding M.D.   On: 11/02/2013 02:49    Review of Systems  Constitutional: Negative for weight loss.  HENT: Negative for ear discharge, ear pain, hearing loss and tinnitus.   Eyes: Negative.  Negative for blurred vision, double vision, photophobia and pain.  Respiratory: Negative for cough, sputum production and shortness of breath.   Cardiovascular: Negative for chest pain.  Gastrointestinal: Positive for nausea, vomiting and abdominal pain.  Genitourinary: Negative for dysuria, urgency, frequency and flank pain.  Musculoskeletal: Negative for back pain, falls, joint pain, myalgias and neck pain.  Neurological: Negative.  Negative for dizziness, tingling, sensory change, focal  weakness, loss of consciousness and headaches.  Endo/Heme/Allergies: Negative.  Does not bruise/bleed easily.  Psychiatric/Behavioral: Negative for depression, memory loss and substance abuse. The patient is not nervous/anxious.     Blood pressure 135/84, pulse 62, temperature 97.2 F (36.2 C), temperature source Oral, resp. rate 16, weight 209 lb (94.802 kg), SpO2 96.00%. Physical Exam  WDWN in NAD HEENT:  EOMI, sclera anicteric Neck:  No masses, no thyromegaly Lungs:  CTA bilaterally; normal respiratory effort CV:  Regular rate and rhythm; no murmurs Abd:  +bowel sounds, soft, obese, moderate RUQ tenderness; no palpable masses Ext:  Well-perfused; no edema Skin:  Warm, dry; no sign of jaundice  Assessment/Plan Symptomatic cholelithiasis with probable early acute cholecystitis - difficult to control her symptoms.  Admit for IV antibiotics Laparoscopic cholecystectomy later today.  The surgical procedure has been discussed with the patient.  Potential risks, benefits, alternative treatments, and expected outcomes have been explained.  All of the patient's questions at this time have been answered.  The likelihood of reaching the patient's treatment goal is good.  The patient understand the proposed surgical procedure and wishes to proceed.   Imogene Burn. Kee Drudge 11/02/2013, 5:39 AM

## 2013-11-02 NOTE — Anesthesia Procedure Notes (Signed)
Procedure Name: Intubation Date/Time: 11/02/2013 2:41 PM Performed by: Jefm Miles E Pre-anesthesia Checklist: Patient identified, Emergency Drugs available, Suction available, Patient being monitored and Timeout performed Patient Re-evaluated:Patient Re-evaluated prior to inductionOxygen Delivery Method: Circle system utilized Preoxygenation: Pre-oxygenation with 100% oxygen Intubation Type: IV induction Ventilation: Mask ventilation without difficulty Laryngoscope Size: 3 and Mac Grade View: Grade I Tube type: Oral Tube size: 7.0 mm Number of attempts: 1 Airway Equipment and Method: Stylet Placement Confirmation: ETT inserted through vocal cords under direct vision,  positive ETCO2 and breath sounds checked- equal and bilateral Secured at: 22 cm Tube secured with: Tape Dental Injury: Teeth and Oropharynx as per pre-operative assessment

## 2013-11-02 NOTE — Anesthesia Postprocedure Evaluation (Signed)
  Anesthesia Post-op Note  Patient: Brooke Schneider  Procedure(s) Performed: Procedure(s): LAPAROSCOPIC CHOLECYSTECTOMY  (N/A)  Patient Location: PACU  Anesthesia Type:General  Level of Consciousness: awake, alert  and oriented  Airway and Oxygen Therapy: Patient Spontanous Breathing and Patient connected to nasal cannula oxygen  Post-op Pain: none  Post-op Assessment: Post-op Vital signs reviewed  Post-op Vital Signs: Reviewed  Last Vitals:  Filed Vitals:   11/02/13 1645  BP:   Pulse: 72  Temp: 36.4 C  Resp: 12    Complications: No apparent anesthesia complications

## 2013-11-02 NOTE — Interval H&P Note (Signed)
History and Physical Interval Note: Symptomatic cholelithiasis.  For OR today. 11/02/2013 2:41 PM  Brooke Schneider  has presented today for surgery, with the diagnosis of Acute cholecystitis  The various methods of treatment have been discussed with the patient and family. After consideration of risks, benefits and other options for treatment, the patient has consented to  Procedure(s): LAPAROSCOPIC CHOLECYSTECTOMY WITH INTRAOPERATIVE CHOLANGIOGRAM (N/A) as a surgical intervention .  The patient's history has been reviewed, patient examined, no change in status, stable for surgery.  I have reviewed the patient's chart and labs.  Questions were answered to the patient's satisfaction.     Cherylynn Ridges

## 2013-11-02 NOTE — Op Note (Signed)
OPERATIVE REPORT  DATE OF OPERATION: 11/02/2013  PATIENT:  Brooke Schneider  63 y.o. female  PRE-OPERATIVE DIAGNOSIS:  Acute cholecystitis  POST-OPERATIVE DIAGNOSIS:  Acute cholecystitis  PROCEDURE:  Procedure(s): LAPAROSCOPIC CHOLECYSTECTOMY   SURGEON:  Surgeon(s): Cherylynn RidgesJames O Jesicca Dipierro, MD Liz MaladyBurke E Daffern, MD  ASSISTANT: Janee Mornhompson, M.D.  ANESTHESIA:   general  EBL: <30 ml  BLOOD ADMINISTERED: none  DRAINS: none   SPECIMEN:  Source of Specimen:  Gallbladder with large stone  COUNTS CORRECT:  YES  PROCEDURE DETAILS: The patient was taken to the operating room and placed on the table in the supine position.  After an adequate endotracheal anesthetic was administered, the patient was prepped with ChloroPrep, and then draped in the usual manner exposing the entire abdomen laterally, inferiorly and up  to the costal margins.  After a proper timeout was performed including identifying the patient and the procedure to be performed, a supraumbilical 1.5cm midline incision was made using a #15 blade.  This was taken down to the fascia which was then incised with a #15 blade.  The edges of the fascia were tented up with Kocher clamps as the preperitoneal space was penetrated with a Kelly clamp into the peritoneum.  Once this was done, a pursestring suture of 0 Vicryl was passed around the fascial opening.  This was subsequently used to secure the [facility] cannula which was passed into the peritoneal cavity.  Once the [facility] cannula was in place, carbon dioxide gas was insufflated into the peritoneal cavity up to a maximal intra-abdominal pressure of 15mm Hg.The laparoscope, with attached camera and light source, was passed into the peritoneal cavity to visualize the direct insertion of two right upper quadrant 5mm cannulas, and a sup-xiphoid 5mm cannula.  Once all cannulas were in place, the dissection was begun.  The patient had a markedly distended and enlarged gallbladder. This required  decompression with a laparoscopic aspirating needle. We were then able to grab it and retracted in the usual manner.  Two ratcheted graspers were attached to the dome and infundibulum of the gallbladder and retracted towards the anterior abdominal wall and the right upper quadrant.  Using cautery attached to a dissecting forceps, the peritoneum overlaying the triangle of Chalot and the hepatoduodenal triangle was dissected away exposing the cystic duct and the cystic artery.  A clip was placed on the gallbladder side of the cystic duct, then a cholecytodochotomy made using the laparoscopic scissors.  Because of Bleeding from an artery which coursed the lobe with the cystic duct the cholangiogram was aborted..  The distal cystic duct was clipped multiple times then transected.  The cystic artery was dissected out in triangle triangle of Calot And proximally and distally clipped and transected..  The gallbladder was then dissected out of the hepatic bed without event.  It was retrieved from the abdomen using an EcoSac without event.  The patient had a markedly enlarged gallstone. It probably measured in somewhere between 4-5 cm. This required that the fascia and this can be extended from the initial incision. Subsequently a running 0 Vicryl sutures used to help close the supraumbilical fascia.  Once the gallbladder was removed, the bed was inspected for hemostasis.  Once excellent hemostasis was obtained all gas and fluids were aspirated from above the liver, then the cannulas were removed.  The supraumbilical incision was closed using the pursestring suture which was in place.  0.25% bupivicaine with epinephrine was injected at all sites.  All 10mm or greater cannula sites were  close using a running subcuticular stitch of 4-0 Monocryl.  5.41mm cannula sites were closed with Dermabond only.Steri-Strips and Tagaderm were used to complete the dressings at all sites.  At this point all needle, sponge, and  instrument counts were correct.The patient was awakened from anesthesia and taken to the PACU in stable condition  PATIENT DISPOSITION:  PACU - hemodynamically stable.   Cherylynn Ridges 5/29/20154:20 PM

## 2013-11-03 MED ORDER — OXYCODONE-ACETAMINOPHEN 5-325 MG PO TABS: 1.0000 | ORAL_TABLET | ORAL | Status: DC | PRN

## 2013-11-03 NOTE — Discharge Summary (Signed)
Physician Discharge Summary  Patient ID: Brooke Schneider MRN: [id number] DOB/AGE: February 15, 1951 63 y.o.  Admit date: 11/02/2013 Discharge date: 11/03/2013  Admission Diagnoses:  cholecystitis  Discharge Diagnoses:  Same with large gallstone  Active Problems:   Gallbladder calculus with acute cholecystitis and no obstruction   Surgery:  Laparoscopic cholecystectomy   Discharged Condition: improved  Hospital Course:   Had surgery per Dr. Lindie Spruce with removal of large gallstone.  Getting along well and ready for discharge home.    Consults: none  Significant Diagnostic Studies: none    Discharge Exam: Blood pressure 121/75, pulse 71, temperature 99 F (37.2 C), temperature source Oral, resp. rate 17, weight 209 lb (94.802 kg), SpO2 93.00%. Incisions covered with steri strips and transparent dressing  Disposition: 01-Home or Self Care  Discharge Instructions   Diet - low sodium heart healthy    Complete by:  As directed      Discharge instructions    Complete by:  As directed   May use laxative of choice and Miralax to avoid constipation     Increase activity slowly    Complete by:  As directed      Leave dressing on - Keep it clean, dry, and intact until clinic visit    Complete by:  As directed             Medication List         buPROPion 150 MG 24 hr tablet  Commonly known as:  WELLBUTRIN XL  TAKE 1 TABLET DAILY     CALCIUM 600+D 600-400 MG-UNIT per tablet  Generic drug:  Calcium Carbonate-Vitamin D  Take 1 tablet by mouth daily.     clotrimazole-betamethasone cream  Commonly known as:  LOTRISONE  Apply topically 2 (two) times daily.     estradiol 0.025 mg/24hr patch  Commonly known as:  CLIMARA - Dosed in mg/24 hr  PLACE 1 PATCH ONTO THE SKIN ONCE A WEEK     fluticasone 50 MCG/ACT nasal spray  Commonly known as:  FLONASE  Place 2 sprays into the nose daily.     glucosamine-chondroitin 500-400 MG tablet  Take 1 tablet by mouth 3 (three) times daily.      levothyroxine 112 MCG tablet  Commonly known as:  SYNTHROID, LEVOTHROID  TAKE 1 TABLET DAILY     MAXALT 10 MG tablet  Generic drug:  rizatriptan  TAKE 1 TABLET ONCE DAILY FOR MIGRAINES     omeprazole 20 MG capsule  Commonly known as:  PRILOSEC  TAKE 1 CAPSULE DAILY     oxyCODONE-acetaminophen 5-325 MG per tablet  Commonly known as:  PERCOCET/ROXICET  Take 1-2 tablets by mouth every 4 (four) hours as needed for moderate pain.     PROAIR HFA 108 (90 BASE) MCG/ACT inhaler  Generic drug:  albuterol  INHALE 2 PUFFS INTO THE LUNGS EVERY 6 HOURS AS NEEDED FOR WHEEZING     propranolol ER 60 MG 24 hr capsule  Commonly known as:  INDERAL LA  TAKE 1 CAPSULE DAILY     topiramate 50 MG tablet  Commonly known as:  TOPAMAX  TAKE 1 TABLET TWICE A DAY     traZODone 50 MG tablet  Commonly known as:  DESYREL  Take 1 tablet (50 mg total) by mouth at bedtime as needed for sleep.           Follow-up Information   Follow up with WYATT, Marta Lamas, MD. Schedule an appointment as soon as possible for a visit  in 2 weeks. (Call for an appointment in 2-3 weeks, sooner if you have an issue.)    Specialty:  General Surgery   Contact information:   [address], [address]  [facility] [place] [address] [phone]       Signed: Valarie MerinoMatthew B Helana Macbride 11/03/2013, 9:35 AM

## 2013-11-03 NOTE — Progress Notes (Signed)
Discharged with volunteer services in attendance.

## 2013-11-03 NOTE — Progress Notes (Signed)
IV removed per order. Discharge instructions and prescription given. Patient wishes to wash off before leaving.

## 2013-11-05 ENCOUNTER — Telehealth: Payer: Self-pay | Admitting: Family Medicine

## 2013-11-05 DIAGNOSIS — G4700 Insomnia, unspecified: Secondary | ICD-10-CM

## 2013-11-05 MED ORDER — TRAZODONE HCL 50 MG PO TABS: 50.0000 mg | ORAL_TABLET | Freq: Every evening | ORAL | Status: DC | PRN

## 2013-11-05 NOTE — Telephone Encounter (Signed)
[phone]  Pharmacy she will use now is [facility]  Pt is needing a refill on traZODone (DESYREL) 50 MG tablet

## 2013-11-05 NOTE — Telephone Encounter (Signed)
Rx Refilled  

## 2013-11-06 ENCOUNTER — Encounter (HOSPITAL_COMMUNITY): Payer: Self-pay | Admitting: General Surgery

## 2013-11-08 ENCOUNTER — Telehealth (INDEPENDENT_AMBULATORY_CARE_PROVIDER_SITE_OTHER): Payer: Self-pay

## 2013-11-08 NOTE — Telephone Encounter (Signed)
Pt question if she should cancel an upcoming dental appt. Advised pt that if it were just a general cleaning, she should be fine. Pt advised that she may reschedule until after her po appt on 6/23.

## 2013-11-12 ENCOUNTER — Telehealth (INDEPENDENT_AMBULATORY_CARE_PROVIDER_SITE_OTHER): Payer: Self-pay | Admitting: General Surgery

## 2013-11-12 NOTE — Telephone Encounter (Signed)
Patient s/p lap chole on 11/02/13.  Patient is still having moderate discomfort and headaches.  She would like a refill on her pain medication.  She was last given percocet at d/c, she was okay with bringing it down to the vicodin.  Informed her that Dr. Lindie Spruce will be in the office this afternoon and that we would send this message to him and if he fills it we will give her a call to pick it up from our front desk.

## 2013-11-13 NOTE — Telephone Encounter (Signed)
Late entry: Dr Lindie Spruce approved  Rx for Norco 5/325mg  1-2 tabs every 4 hrs prn. Rx given to front desk

## 2013-11-20 ENCOUNTER — Ambulatory Visit: Admitting: Nurse Practitioner

## 2013-11-27 ENCOUNTER — Encounter (INDEPENDENT_AMBULATORY_CARE_PROVIDER_SITE_OTHER): Payer: Self-pay | Admitting: General Surgery

## 2013-11-27 ENCOUNTER — Ambulatory Visit (INDEPENDENT_AMBULATORY_CARE_PROVIDER_SITE_OTHER): Admitting: General Surgery

## 2013-11-27 VITALS — BP 132/80 | HR 76 | Temp 97.5°F | Ht 66.0 in | Wt 205.0 lb

## 2013-11-27 DIAGNOSIS — Z09 Encounter for follow-up examination after completed treatment for conditions other than malignant neoplasm: Secondary | ICD-10-CM | POA: Insufficient documentation

## 2013-11-27 NOTE — Progress Notes (Signed)
Subjective:     Patient ID: Brooke Schneider, female   DOB: 07/17/1950, 63 y.o.   MRN: [id number]  HPI The patient is doing fairly well status post laparoscopic cholecystectomy. She states that she still only back to approximately 70% of normal. Most of her problems or tightness in just fullness in the upper portion of her abdomen.    Review of Systems Fevers, chills, nausea, or vomiting. She's had no jaundice. She has had some constipation.    Objective:   Physical Exam Her incisions have healed well with no evidence of infection. She has normal bowel sounds. She has no murmurs gallops or heaves. No abdominal tenderness.    Assessment:     Status post laparoscopic cholecystectomy doing well.     Plan:     She can return to see me on an as-needed basis.

## 2013-12-03 ENCOUNTER — Telehealth: Payer: Self-pay | Admitting: *Deleted

## 2013-12-03 NOTE — Telephone Encounter (Signed)
Patient states she is having itching and burning on the outside. Patient has an appointment on 12/27/2013.  6:03 Tried to call patient back at both numbers- no answer- left message on VM- ? If she is still using HRT- she was in the past- could be yeast symptoms- patient may try OTC yeast product to see if that helps- call back in the am if needs to be seen or request other medication.

## 2013-12-27 ENCOUNTER — Ambulatory Visit: Admitting: Obstetrics & Gynecology

## 2014-01-03 ENCOUNTER — Other Ambulatory Visit: Payer: Self-pay | Admitting: Family Medicine

## 2014-01-03 NOTE — Telephone Encounter (Signed)
Refill appropriate and filled per protocol. 

## 2014-01-11 ENCOUNTER — Telehealth: Payer: Self-pay | Admitting: *Deleted

## 2014-01-11 NOTE — Telephone Encounter (Signed)
Patient's name on wait list for an appointment, left message to call back if she wishes to come in sooner than scheduled appointment 01/29/14.

## 2014-01-11 NOTE — Telephone Encounter (Signed)
Patient returned my call, did not want to come in early, name has been removed from wait list.

## 2014-01-23 ENCOUNTER — Ambulatory Visit (INDEPENDENT_AMBULATORY_CARE_PROVIDER_SITE_OTHER): Admitting: Obstetrics & Gynecology

## 2014-01-23 ENCOUNTER — Encounter: Payer: Self-pay | Admitting: Obstetrics & Gynecology

## 2014-01-23 VITALS — BP 110/90 | Temp 97.7°F | Ht 65.5 in | Wt 202.0 lb

## 2014-01-23 DIAGNOSIS — N760 Acute vaginitis: Secondary | ICD-10-CM

## 2014-01-23 LAB — POCT WET PREP (WET MOUNT)
Clue Cells Wet Prep Whiff POC: POSITIVE
TRICHOMONAS WET PREP HPF POC: NEGATIVE

## 2014-01-23 MED ORDER — CLINDAMYCIN HCL 300 MG PO CAPS: 300.0000 mg | ORAL_CAPSULE | Freq: Two times a day (BID) | ORAL | Status: DC

## 2014-01-23 NOTE — Progress Notes (Signed)
Patient ID: Brooke Schneider, female   DOB: 1951-01-23, 63 y.o.   MRN: [id number]  Chief Complaint  Patient presents with  . Problem    Itching.     HPI Brooke Schneider is a 63 y.o. female.  Denies exposures. Vaginal Itching The patient's primary symptoms include genital itching. This is a new problem. The current episode started yesterday. The problem has been unchanged. The patient is experiencing no pain. Nothing aggravates the symptoms. She has tried nothing for the symptoms.    Past Medical History  Diagnosis Date  . Goiter   . Asthma   . Allergic rhinitis   . Depression   . Goiter   . WUJWJXBJ(478.2)     Past Surgical History  Procedure Laterality Date  . Knee surgery    . Thyroidectomy    . Abdominal hysterectomy    . Cholecystectomy N/A 11/02/2013    Procedure: LAPAROSCOPIC CHOLECYSTECTOMY ;  Surgeon: Cherylynn Ridges, MD;  Location: [facility] OR;  Service: General;  Laterality: N/A;    Family History  Problem Relation Age of Onset  . Allergies Mother   . ALS Father   . Prostate cancer Father     Social History History  Substance Use Topics  . Smoking status: Passive Smoke Exposure - Never Smoker  . Smokeless tobacco: Never Used     Comment: husband smoking cigars  . Alcohol Use: Yes     Comment: occ    Allergies  Allergen Reactions  . Sulfamethoxazole-Trimethoprim Swelling  . Metronidazole Itching and Rash    Current Outpatient Prescriptions  Medication Sig Dispense Refill  . buPROPion (WELLBUTRIN XL) 150 MG 24 hr tablet TAKE 1 TABLET DAILY  90 tablet  3  . Calcium Carbonate-Vitamin D (CALCIUM 600+D) 600-400 MG-UNIT per tablet Take 1 tablet by mouth daily.       . clotrimazole-betamethasone (LOTRISONE) cream Apply topically 2 (two) times daily.  30 g  0  . estradiol (CLIMARA - DOSED IN MG/24 HR) 0.025 mg/24hr patch PLACE 1 PATCH ONTO THE SKIN ONCE A WEEK  12 patch  3  . fluticasone (FLONASE) 50 MCG/ACT nasal spray Place 2 sprays into the nose daily.  16 g  6   . glucosamine-chondroitin 500-400 MG tablet Take 1 tablet by mouth 3 (three) times daily.      Marland Kitchen levothyroxine (SYNTHROID, LEVOTHROID) 112 MCG tablet TAKE 1 TABLET DAILY  90 tablet  3  . omeprazole (PRILOSEC) 20 MG capsule TAKE 1 CAPSULE DAILY  90 capsule  3  . PROAIR HFA 108 (90 BASE) MCG/ACT inhaler INHALE 2 PUFFS INTO THE LUNGS EVERY 6 HOURS AS NEEDED FOR WHEEZING  7 each  3  . propranolol ER (INDERAL LA) 60 MG 24 hr capsule TAKE 1 CAPSULE DAILY  90 capsule  3  . topiramate (TOPAMAX) 50 MG tablet TAKE 1 TABLET TWICE A DAY  180 tablet  3  . traZODone (DESYREL) 50 MG tablet Take 1 tablet (50 mg total) by mouth at bedtime as needed for sleep.  30 tablet  3  . clindamycin (CLEOCIN) 300 MG capsule Take 1 capsule (300 mg total) by mouth 2 (two) times daily. For 7 days  14 capsule  0   No current facility-administered medications for this visit.    Review of Systems Review of Systems Constitutional: negative for fatigue and weight loss Respiratory: negative for cough and wheezing Cardiovascular: negative for chest pain, fatigue and palpitations Gastrointestinal: negative for abdominal pain and change in bowel habits Genitourinary:  positive for vaginal itching Integument/breast: negative for nipple discharge Musculoskeletal:negative for myalgias Neurological: negative for gait problems and tremors Behavioral/Psych: negative for abusive relationship, depression Endocrine: negative for temperature intolerance     Blood pressure 110/90, temperature 97.7 F (36.5 C), height 5' 5.5" (1.664 m), weight 91.627 kg (202 lb).  Physical Exam Physical Exam General:   alert  Skin:   no rash or abnormalities  Abdomen:  normal findings: no organomegaly, soft, non-tender and no hernia  Pelvis:  External genitalia: normal general appearance Urinary system: urethral meatus normal and bladder without fullness, nontender Vaginal: thin, white discharge Cervix: normal appearance Adnexa: normal bimanual  exam Uterus: anteverted and non-tender, normal size      Data Reviewed None  Assessment    Bacterial vaginosis     Plan     Meds ordered this encounter  Medications  . clindamycin (CLEOCIN) 300 MG capsule    Sig: Take 1 capsule (300 mg total) by mouth 2 (two) times daily. For 7 days    Dispense:  14 capsule    Refill:  0    Possible management options include: Avoid irritants. Follow up as needed.        JACKSON-MOORE,Rose Hegner A 01/23/2014, 4:40 PM

## 2014-01-23 NOTE — Patient Instructions (Signed)
Bacterial Vaginosis Bacterial vaginosis is a vaginal infection that occurs when the normal balance of bacteria in the vagina is disrupted. It results from an overgrowth of certain bacteria. This is the most common vaginal infection in women of childbearing age. Treatment is important to prevent complications, especially in pregnant women, as it can cause a premature delivery. CAUSES  Bacterial vaginosis is caused by an increase in harmful bacteria that are normally present in smaller amounts in the vagina. Several different kinds of bacteria can cause bacterial vaginosis. However, the reason that the condition develops is not fully understood. RISK FACTORS Certain activities or behaviors can put you at an increased risk of developing bacterial vaginosis, including:  Having a new sex partner or multiple sex partners.  Douching.  Using an intrauterine device (IUD) for contraception. Women do not get bacterial vaginosis from toilet seats, bedding, swimming pools, or contact with objects around them. SIGNS AND SYMPTOMS  Some women with bacterial vaginosis have no signs or symptoms. Common symptoms include:  Grey vaginal discharge.  A fishlike odor with discharge, especially after sexual intercourse.  Itching or burning of the vagina and vulva.  Burning or pain with urination. DIAGNOSIS  Your health care provider will take a medical history and examine the vagina for signs of bacterial vaginosis. A sample of vaginal fluid may be taken. Your health care provider will look at this sample under a microscope to check for bacteria and abnormal cells. A vaginal pH test may also be done.  TREATMENT  Bacterial vaginosis may be treated with antibiotic medicines. These may be given in the form of a pill or a vaginal cream. A second round of antibiotics may be prescribed if the condition comes back after treatment.  HOME CARE INSTRUCTIONS   Only take over-the-counter or prescription medicines as  directed by your health care provider.  If antibiotic medicine was prescribed, take it as directed. Make sure you finish it even if you start to feel better.  Do not have sex until treatment is completed.  Tell all sexual partners that you have a vaginal infection. They should see their health care provider and be treated if they have problems, such as a mild rash or itching.  Practice safe sex by using condoms and only having one sex partner. SEEK MEDICAL CARE IF:   Your symptoms are not improving after 3 days of treatment.  You have increased discharge or pain.  You have a fever. MAKE SURE YOU:   Understand these instructions.  Will watch your condition.  Will get help right away if you are not doing well or get worse. FOR MORE INFORMATION  Centers for Disease Control and Prevention, Division of STD Prevention: www.cdc.gov/std American Sexual Health Association (ASHA): www.ashastd.org  Document Released: 05/24/2005 Document Revised: 03/14/2013 Document Reviewed: 01/03/2013 ExitCare Patient Information 2015 [employer]. This information is not intended to replace advice given to you by your health care provider. Make sure you discuss any questions you have with your health care provider.  

## 2014-01-23 NOTE — Addendum Note (Signed)
Addended by: Shelisa Fern D on: 01/23/2014 04:50 PM   Modules accepted: Orders  

## 2014-01-24 LAB — WET PREP BY MOLECULAR PROBE
Candida species: NEGATIVE
Gardnerella vaginalis: POSITIVE — AB
Trichomonas vaginosis: NEGATIVE

## 2014-01-29 ENCOUNTER — Ambulatory Visit (INDEPENDENT_AMBULATORY_CARE_PROVIDER_SITE_OTHER): Admitting: Nurse Practitioner

## 2014-01-29 ENCOUNTER — Encounter: Payer: Self-pay | Admitting: Nurse Practitioner

## 2014-01-29 VITALS — BP 125/81 | HR 59 | Ht 65.5 in | Wt 202.0 lb

## 2014-01-29 DIAGNOSIS — G43909 Migraine, unspecified, not intractable, without status migrainosus: Secondary | ICD-10-CM

## 2014-01-29 MED ORDER — RIZATRIPTAN BENZOATE 10 MG PO TABS: 10.0000 mg | ORAL_TABLET | ORAL | Status: DC | PRN

## 2014-01-29 NOTE — Patient Instructions (Signed)
Continue Topamax 50 mg twice daily, does not need refills Continue Maxalt 10 mg when necessary acute migraine will refill Followup yearly and when necessary

## 2014-01-29 NOTE — Progress Notes (Signed)
[facility]  PATIENT: Brooke Schneider DOB: 07/11/1950   REASON FOR VISIT: for headaches    HISTORY OF PRESENT ILLNESS: Brooke Schneider, 63 year old female returns for followup. She was last seen on 11/27/2012. She   has a history of headaches since June 2012 starting at the mid left parietal area going to the frontal area and then will spread across the whole frontal area sometimes she will have pain on her ear and down the left side of her neck. The pain was initially like a sharp pain which would last for 2-3 hours, she was taking Maxalt with relief but for some reason over the past year she did not ask for Maxalt renewal and has not taken that in about 6 months. She is also taking Topamax twice daily. She did have some mild blurred vision and noise discomfort with these headaches initially. Denies photophobia, nausea, vomiting or dizziness.  Headaches occur about  1 every 1-4 weeks.  She has retired from Engineer, manufacturing. She has a mother living who has memory loss and she says this is stressful for her. She had recent cholecystectomy on 11/02/2013. She returns for reevaluation    REVIEW OF SYSTEMS: Full 14 system review of systems performed and notable only for those listed, all others are neg:  Constitutional: N/A  Cardiovascular: N/A  Ear/Nose/Throat: N/A  Skin: N/A  Eyes: Light sensitivity Respiratory: Shortness of breath Gastroitestinal: Urinary frequency Hematology/Lymphatic: N/A  Endocrine: N/A Musculoskeletal: Joint pain Allergy/Immunology: Environmental allergies  Neurological: Headaches Psychiatric: N/A Sleep : NA   ALLERGIES: Allergies  Allergen Reactions  . Sulfamethoxazole-Trimethoprim Swelling  . Metronidazole Itching and Rash    HOME MEDICATIONS: Outpatient Prescriptions Prior to Visit  Medication Sig Dispense Refill  . buPROPion (WELLBUTRIN XL) 150 MG 24 hr tablet TAKE 1 TABLET DAILY  90 tablet  3  . Calcium Carbonate-Vitamin D  (CALCIUM 600+D) 600-400 MG-UNIT per tablet Take 1 tablet by mouth 2 (two) times daily.       . clindamycin (CLEOCIN) 300 MG capsule Take 1 capsule (300 mg total) by mouth 2 (two) times daily. For 7 days  14 capsule  0  . clotrimazole-betamethasone (LOTRISONE) cream Apply topically 2 (two) times daily.  30 g  0  . estradiol (CLIMARA - DOSED IN MG/24 HR) 0.025 mg/24hr patch PLACE 1 PATCH ONTO THE SKIN ONCE A WEEK  12 patch  3  . fluticasone (FLONASE) 50 MCG/ACT nasal spray Place 2 sprays into the nose daily.  16 g  6  . glucosamine-chondroitin 500-400 MG tablet Take 1 tablet by mouth 3 (three) times daily.      Marland Kitchen levothyroxine (SYNTHROID, LEVOTHROID) 112 MCG tablet TAKE 1 TABLET DAILY  90 tablet  3  . omeprazole (PRILOSEC) 20 MG capsule TAKE 1 CAPSULE DAILY  90 capsule  3  . PROAIR HFA 108 (90 BASE) MCG/ACT inhaler INHALE 2 PUFFS INTO THE LUNGS EVERY 6 HOURS AS NEEDED FOR WHEEZING  7 each  3  . propranolol ER (INDERAL LA) 60 MG 24 hr capsule TAKE 1 CAPSULE DAILY  90 capsule  3  . topiramate (TOPAMAX) 50 MG tablet TAKE 1 TABLET TWICE A DAY  180 tablet  3  . traZODone (DESYREL) 50 MG tablet Take 1 tablet (50 mg total) by mouth at bedtime as needed for sleep.  30 tablet  3   No facility-administered medications prior to visit.    PAST MEDICAL HISTORY: Past Medical History  Diagnosis Date  . Goiter   .  Asthma   . Allergic rhinitis   . Depression   . Goiter   . Headache(784.0)     PAST SURGICAL HISTORY: Past Surgical History  Procedure Laterality Date  . Knee surgery    . Thyroidectomy    . Abdominal hysterectomy    . Cholecystectomy N/A 11/02/2013    Procedure: LAPAROSCOPIC CHOLECYSTECTOMY ;  Surgeon: Cherylynn Ridges, MD;  Location: [facility] OR;  Service: General;  Laterality: N/A;    FAMILY HISTORY: Family History  Problem Relation Age of Onset  . Allergies Mother   . ALS Father   . Prostate cancer Father     SOCIAL HISTORY: History   Social History  . Marital Status: Married     Spouse Name: [name]    Number of Children: 2  . Years of Education: college   Occupational History  . housewife    Social History Main Topics  . Smoking status: Passive Smoke Exposure - Never Smoker  . Smokeless tobacco: Never Used     Comment: husband smoking cigars  . Alcohol Use: Yes     Comment: occ  . Drug Use: No  . Sexual Activity: Not Currently   Other Topics Concern  . Not on file   Social History Narrative   Patient lives at home with her husband [name]).   Retired.   Education college B.S.   Right handed.   Caffeine one cup of coffee daily.     PHYSICAL EXAM  Filed Vitals:   01/29/14 0946  BP: 125/81  Pulse: 59  Height: 5' 5.5" (1.664 m)  Weight: 202 lb (91.627 kg)   Body mass index is 33.09 kg/(m^2). General: well developed, well nourished, seated, in no evident distress  Head: head normocephalic and atraumatic. Oropharynx benign  Neck: supple with no carotid bruits  Cardiovascular: regular rate and rhythm, no murmurs  Neurologic Exam  Mental Status: Awake and fully alert. Oriented to place and time. Follows all commands. Speech and language normal.  Cranial Nerves: Pupils equal, briskly reactive to light. Extraocular movements full without nystagmus. Visual fields full to confrontation. Hearing intact and symmetric to finger snap. Facial sensation intact. Face, tongue, palate move normally and symmetrically. Neck flexion and extension normal.  Motor: Normal bulk and tone. Normal strength in all tested extremity muscles.No focal weakness  Coordination: Rapid alternating movements normal in all extremities. Finger-to-nose and heel-to-shin performed accurately bilaterally.  Gait and Station: Arises from chair without difficulty. Stance is normal. . Able to heel, toe and tandem walk without difficulty.  Reflexes: 2+ and symmetric. Toes downgoing.     DIAGNOSTIC DATA (LABS, IMAGING, TESTING) - I reviewed patient records, labs, notes, testing and imaging  myself where available.  Lab Results  Component Value Date   WBC 6.7 11/01/2013   HGB 13.0 11/01/2013   HCT 39.2 11/01/2013   MCV 92.7 11/01/2013   PLT 297 11/01/2013      Component Value Date/Time   NA 141 11/01/2013 1810   K 3.9 11/01/2013 1810   CL 103 11/01/2013 1810   CO2 25 11/01/2013 1810   GLUCOSE 87 11/01/2013 1810   BUN 11 11/01/2013 1810   CREATININE 0.84 11/01/2013 1810   CALCIUM 9.3 11/01/2013 1810   PROT 8.4* 11/01/2013 1810   ALBUMIN 3.8 11/01/2013 1810   AST 17 11/01/2013 1810   ALT 17 11/01/2013 1810   ALKPHOS 66 11/01/2013 1810   BILITOT 0.2* 11/01/2013 1810   GFRNONAA 72* 11/01/2013 1810   GFRAA 84* 11/01/2013 1810  ASSESSMENT AND PLAN  63 y.o. year old female  has a past medical history of Goiter; Asthma; Allergic rhinitis; Depression; and  Migraine headache here to followup. She is currently on Topamax 50 mg twice daily. She has stopped her Maxalt for reasons that are unclear. She is a patient of Dr. Terrace Arabia who is out of the office.   Continue Topamax 50 mg twice daily, does not need refills Continue Maxalt 10 mg when necessary acute migraine will refill Followup yearly and when necessary Nilda Riggs, [facility], [facility], APRN  [facility] [address] [place], [place] [address] [phone]

## 2014-02-01 ENCOUNTER — Telehealth: Payer: Self-pay | Admitting: Family Medicine

## 2014-02-01 DIAGNOSIS — G4700 Insomnia, unspecified: Secondary | ICD-10-CM

## 2014-02-01 NOTE — Telephone Encounter (Signed)
[phone]   Pt wants to give [place] fax number for [facility] [phone]

## 2014-02-02 MED ORDER — TRAZODONE HCL 50 MG PO TABS: 50.0000 mg | ORAL_TABLET | Freq: Every evening | ORAL | Status: DC | PRN

## 2014-02-02 NOTE — Telephone Encounter (Signed)
Call placed to patient.   States that she needs refill on Trazodone.  Prescription sent to pharmacy.

## 2014-02-04 NOTE — Progress Notes (Signed)
I reviewed note and agree with plan.   VIKRAM R. PENUMALLI, MD 02/04/2014, 4:52 PM Certified in Neurology, Neurophysiology and Neuroimaging  [facility] [address], [address] [place], [place] [address] [phone]  

## 2014-02-21 ENCOUNTER — Ambulatory Visit (INDEPENDENT_AMBULATORY_CARE_PROVIDER_SITE_OTHER): Admitting: Family Medicine

## 2014-02-21 ENCOUNTER — Encounter: Payer: Self-pay | Admitting: Family Medicine

## 2014-02-21 VITALS — BP 110/74 | HR 86 | Temp 97.6°F | Resp 16 | Ht 66.0 in | Wt 200.0 lb

## 2014-02-21 DIAGNOSIS — L304 Erythema intertrigo: Secondary | ICD-10-CM

## 2014-02-21 DIAGNOSIS — L538 Other specified erythematous conditions: Secondary | ICD-10-CM

## 2014-02-21 MED ORDER — HYDROXYZINE HCL 25 MG PO TABS: 25.0000 mg | ORAL_TABLET | Freq: Three times a day (TID) | ORAL | Status: DC | PRN

## 2014-02-21 MED ORDER — CLOTRIMAZOLE-BETAMETHASONE 1-0.05 % EX CREA: 1.0000 "application " | TOPICAL_CREAM | Freq: Two times a day (BID) | CUTANEOUS | Status: DC

## 2014-02-21 NOTE — Progress Notes (Signed)
Subjective:    Patient ID: Brooke Schneider, female    DOB: 1950-08-10, 63 y.o.   MRN: [id number]  HPI Patient presents with an itchy rash beneath her breasts in the intertriginous crease.   The skin is hyperpigmented.  There is no scale. She has a 1.5 cm sebaceous cyst on the posterior aspect of her left shoulder/neck. She also complains of occasional itching on her back and on her arms although there is no visible rash. Past Medical History  Diagnosis Date  . Goiter   . Asthma   . Allergic rhinitis   . Depression   . Goiter   . OHYWVPXT(062.6)    Past Surgical History  Procedure Laterality Date  . Knee surgery    . Thyroidectomy    . Abdominal hysterectomy    . Cholecystectomy N/A 11/02/2013    Procedure: LAPAROSCOPIC CHOLECYSTECTOMY ;  Surgeon: Cherylynn Ridges, MD;  Location: [facility] OR;  Service: General;  Laterality: N/A;   Current Outpatient Prescriptions on File Prior to Visit  Medication Sig Dispense Refill  . buPROPion (WELLBUTRIN XL) 150 MG 24 hr tablet TAKE 1 TABLET DAILY  90 tablet  3  . Calcium Carbonate-Vitamin D (CALCIUM 600+D) 600-400 MG-UNIT per tablet Take 1 tablet by mouth 2 (two) times daily.       Marland Kitchen estradiol (CLIMARA - DOSED IN MG/24 HR) 0.025 mg/24hr patch PLACE 1 PATCH ONTO THE SKIN ONCE A WEEK  12 patch  3  . fluticasone (FLONASE) 50 MCG/ACT nasal spray Place 2 sprays into the nose daily.  16 g  6  . levothyroxine (SYNTHROID, LEVOTHROID) 112 MCG tablet TAKE 1 TABLET DAILY  90 tablet  3  . omeprazole (PRILOSEC) 20 MG capsule TAKE 1 CAPSULE DAILY  90 capsule  3  . PROAIR HFA 108 (90 BASE) MCG/ACT inhaler INHALE 2 PUFFS INTO THE LUNGS EVERY 6 HOURS AS NEEDED FOR WHEEZING  7 each  3  . propranolol ER (INDERAL LA) 60 MG 24 hr capsule TAKE 1 CAPSULE DAILY  90 capsule  3  . rizatriptan (MAXALT) 10 MG tablet Take 1 tablet (10 mg total) by mouth as needed for migraine. May repeat in 2 hours if needed  30 tablet  3  . topiramate (TOPAMAX) 50 MG tablet TAKE 1 TABLET TWICE A  DAY  180 tablet  3  . traZODone (DESYREL) 50 MG tablet Take 1 tablet (50 mg total) by mouth at bedtime as needed for sleep.  90 tablet  0  . glucosamine-chondroitin 500-400 MG tablet Take 1 tablet by mouth 3 (three) times daily.       No current facility-administered medications on file prior to visit.   Allergies  Allergen Reactions  . Sulfamethoxazole-Trimethoprim Swelling  . Metronidazole Itching and Rash   History   Social History  . Marital Status: Married    Spouse Name: [name]    Number of Children: 2  . Years of Education: college   Occupational History  . housewife    Social History Main Topics  . Smoking status: Passive Smoke Exposure - Never Smoker  . Smokeless tobacco: Never Used     Comment: husband smoking cigars  . Alcohol Use: Yes     Comment: occ  . Drug Use: No  . Sexual Activity: Not Currently   Other Topics Concern  . Not on file   Social History Narrative   Patient lives at home with her husband [name]).   Retired.   Education college B.S.  Right handed.   Caffeine one cup of coffee daily.      Review of Systems  All other systems reviewed and are negative.      Objective:   Physical Exam  Vitals reviewed. Constitutional: She appears well-developed and well-nourished.  Cardiovascular: Normal rate and regular rhythm.   No murmur heard. Pulmonary/Chest: Effort normal and breath sounds normal.  Skin: Rash noted. No erythema.          Assessment & Plan:  Intertrigo - Plan: hydrOXYzine (ATARAX/VISTARIL) 25 MG tablet, clotrimazole-betamethasone (LOTRISONE) cream, DISCONTINUED: clotrimazole-betamethasone (LOTRISONE) cream, DISCONTINUED: hydrOXYzine (ATARAX/VISTARIL) 25 MG tablet   Patient has intertrigo underneath her bottom. I prescribed Lotrisone cream to be applied twice a day for 2 weeks to cure that rash. Patient can use hydroxyzine 25 mg every 8 hours as needed for itching. I explained to the patient the lesion on the posterior  aspect of her neck and left shoulder is a sebaceous cyst. Glad to arrange surgical excision if she so desires.

## 2014-02-25 ENCOUNTER — Telehealth: Payer: Self-pay | Admitting: Family Medicine

## 2014-02-25 NOTE — Telephone Encounter (Signed)
Patient is saying that the hydroxyzine is not working and dr pickard said that we could try something different if it didn't work please call her back at [phone]

## 2014-02-26 NOTE — Telephone Encounter (Signed)
Does she mean it is not helping with insomnia?  If so, she could try belsomra 10 mg poqhs.

## 2014-02-26 NOTE — Telephone Encounter (Signed)
Taking it for itching and not helping.  Rash is better but still has itching.  Already takes Trazodone for sleep.

## 2014-02-26 NOTE — Telephone Encounter (Signed)
Patient is calling back again this morning regarding this message

## 2014-02-28 MED ORDER — MIRTAZAPINE 30 MG PO TABS: 30.0000 mg | ORAL_TABLET | Freq: Every day | ORAL | Status: DC

## 2014-02-28 NOTE — Telephone Encounter (Signed)
I am not sure why she is itching as their was no rash on her body visible.  Lets try remeron 30 mg poqhs to see if this will blunt some of the itching.  She could still use the hydroxyzine as well.

## 2014-02-28 NOTE — Telephone Encounter (Signed)
Call placed to patient and patient made aware.   Prescription sent to pharmacy.  

## 2014-04-08 ENCOUNTER — Encounter: Payer: Self-pay | Admitting: Family Medicine

## 2014-04-24 ENCOUNTER — Encounter: Payer: Self-pay | Admitting: Neurology

## 2014-04-30 ENCOUNTER — Encounter: Payer: Self-pay | Admitting: Neurology

## 2014-06-03 ENCOUNTER — Encounter: Payer: Self-pay | Admitting: *Deleted

## 2014-06-04 ENCOUNTER — Encounter: Payer: Self-pay | Admitting: Obstetrics & Gynecology

## 2014-06-05 ENCOUNTER — Ambulatory Visit: Admitting: Physician Assistant

## 2014-06-06 ENCOUNTER — Other Ambulatory Visit: Payer: Self-pay | Admitting: Family Medicine

## 2014-06-11 ENCOUNTER — Encounter: Payer: Self-pay | Admitting: Family Medicine

## 2014-06-11 ENCOUNTER — Ambulatory Visit (INDEPENDENT_AMBULATORY_CARE_PROVIDER_SITE_OTHER): Admitting: Family Medicine

## 2014-06-11 VITALS — BP 114/70 | HR 76 | Temp 97.7°F | Resp 20 | Wt 204.0 lb

## 2014-06-11 DIAGNOSIS — L218 Other seborrheic dermatitis: Secondary | ICD-10-CM

## 2014-06-11 DIAGNOSIS — L299 Pruritus, unspecified: Secondary | ICD-10-CM

## 2014-06-11 DIAGNOSIS — L219 Seborrheic dermatitis, unspecified: Secondary | ICD-10-CM

## 2014-06-11 MED ORDER — FLUOCINOLONE ACETONIDE SCALP 0.01 % EX OIL: TOPICAL_OIL | CUTANEOUS | Status: DC

## 2014-06-11 NOTE — Progress Notes (Signed)
Subjective:    Patient ID: Brooke Schneider, female    DOB: 11/16/1950, 64 y.o.   MRN: [id number]  HPI Patient complains of 2 months of itching in her scalp. There is no visible rash today. She also complains of a dry scalp. She also complains of itching on her neck. There is no visible rash on her neck. I had previously seen the patient for itching underneath her breast at the time the patient had intertrigo. She denies any sick contacts. Her husband has not been itching. She denies any itching on the chest back abdomen Schneider legs Schneider arms. Past Medical History  Diagnosis Date  . Goiter   . Asthma   . Allergic rhinitis   . Depression   . Goiter   . WUJWJXBJ(478.2Headache(784.0)    Past Surgical History  Procedure Laterality Date  . Knee surgery    . Thyroidectomy    . Abdominal hysterectomy    . Cholecystectomy N/A 11/02/2013    Procedure: LAPAROSCOPIC CHOLECYSTECTOMY ;  Surgeon: Brooke RidgesJames O Wyatt, MD;  Location: [facility];  Service: General;  Laterality: N/A;   Current Outpatient Prescriptions on File Prior to Visit  Medication Sig Dispense Refill  . buPROPion (WELLBUTRIN XL) 150 MG 24 hr tablet TAKE 1 TABLET DAILY 90 tablet 3  . Calcium Carbonate-Vitamin D (CALCIUM 600+D) 600-400 MG-UNIT per tablet Take 1 tablet by mouth 2 (two) times daily.     Marland Kitchen. estradiol (CLIMARA - DOSED IN MG/24 HR) 0.025 mg/24hr patch APPLY 1 PATCH ONTO THE SKIN ONCE A WEEK 12 patch 2  . fluticasone (FLONASE) 50 MCG/ACT nasal spray Place 2 sprays into the nose daily. 16 g 6  . glucosamine-chondroitin 500-400 MG tablet Take 1 tablet by mouth 3 (three) times daily.    Marland Kitchen. levothyroxine (SYNTHROID, LEVOTHROID) 112 MCG tablet TAKE 1 TABLET DAILY 90 tablet 3  . omeprazole (PRILOSEC) 20 MG capsule TAKE 1 CAPSULE DAILY 90 capsule 3  . PROAIR HFA 108 (90 BASE) MCG/ACT inhaler INHALE 2 PUFFS INTO THE LUNGS EVERY 6 HOURS AS NEEDED FOR WHEEZING 7 each 3  . propranolol ER (INDERAL LA) 60 MG 24 hr capsule TAKE 1 CAPSULE DAILY 90 capsule 3  .  rizatriptan (MAXALT) 10 MG tablet Take 1 tablet (10 mg total) by mouth as needed for migraine. May repeat in 2 hours if needed 30 tablet 3  . topiramate (TOPAMAX) 50 MG tablet TAKE 1 TABLET TWICE A DAY 180 tablet 3  . clotrimazole-betamethasone (LOTRISONE) cream Apply 1 application topically 2 (two) times daily. (Patient not taking: Reported on 06/11/2014) 30 g 0  . hydrOXYzine (ATARAX/VISTARIL) 25 MG tablet Take 1 tablet (25 mg total) by mouth 3 (three) times daily as needed for itching. (Patient not taking: Reported on 06/11/2014) 30 tablet 0  . mirtazapine (REMERON) 30 MG tablet Take 1 tablet (30 mg total) by mouth at bedtime. 90 tablet 0  . traZODone (DESYREL) 50 MG tablet Take 1 tablet (50 mg total) by mouth at bedtime as needed for sleep. (Patient not taking: Reported on 06/11/2014) 90 tablet 0   No current facility-administered medications on file prior to visit.   Allergies  Allergen Reactions  . Sulfamethoxazole-Trimethoprim Swelling  . Metronidazole Itching and Rash   History   Social History  . Marital Status: Married    Spouse Name: Brooke Dienerlan    Number of Children: 2  . Years of Education: college   Occupational History  . housewife    Social History Main Topics  .  Smoking status: Passive Smoke Exposure - Never Smoker  . Smokeless tobacco: Never Used     Comment: husband smoking cigars  . Alcohol Use: Yes     Comment: occ  . Drug Use: No  . Sexual Activity: Not Currently   Other Topics Concern  . Not on file   Social History Narrative   Patient lives at home with her husband Brooke Schneider).   Retired.   Education college B.S.   Right handed.   Caffeine one cup of coffee daily.      Review of Systems  All other systems reviewed and are negative.      Objective:   Physical Exam  Neck: No thyromegaly present.  Cardiovascular: Normal rate and regular rhythm.   Pulmonary/Chest: Effort normal and breath sounds normal.  Skin: No rash noted. No erythema.            Assessment & Plan:  Pruritus - Plan: CBC with Differential, COMPLETE METABOLIC PANEL WITH GFR, Sedimentation rate  Seborrheic dermatitis of scalp  I will rule out other causes of diffuse itching by checking a CBC and a CMP. That being said I believe the patient has seborrheic dermatitis of the scalp.  I will treat the patient with Derma-Smoothe oil applied to the scalp once a week and rinsed off after 4 hours.Marland Kitchen

## 2014-06-11 NOTE — Addendum Note (Signed)
Addended by: Phillips OdorSIX, Kevyn Wengert H on: 06/11/2014 04:51 PM   Modules accepted: Orders

## 2014-06-12 LAB — COMPLETE METABOLIC PANEL WITH GFR
ALT: 14 U/L (ref 0–35)
AST: 14 U/L (ref 0–37)
Albumin: 4 g/dL (ref 3.5–5.2)
Alkaline Phosphatase: 55 U/L (ref 39–117)
BILIRUBIN TOTAL: 0.3 mg/dL (ref 0.2–1.2)
BUN: 12 mg/dL (ref 6–23)
CHLORIDE: 107 meq/L (ref 96–112)
CO2: 22 mEq/L (ref 19–32)
CREATININE: 0.9 mg/dL (ref 0.50–1.10)
Calcium: 8.5 mg/dL (ref 8.4–10.5)
GFR, Est African American: 79 mL/min
GFR, Est Non African American: 68 mL/min
Glucose, Bld: 101 mg/dL — ABNORMAL HIGH (ref 70–99)
Potassium: 4 mEq/L (ref 3.5–5.3)
SODIUM: 140 meq/L (ref 135–145)
Total Protein: 7.1 g/dL (ref 6.0–8.3)

## 2014-06-12 LAB — CBC WITH DIFFERENTIAL/PLATELET
Basophils Absolute: 0.1 10*3/uL (ref 0.0–0.1)
Basophils Relative: 1 % (ref 0–1)
EOS PCT: 5 % (ref 0–5)
Eosinophils Absolute: 0.3 10*3/uL (ref 0.0–0.7)
HCT: 40.5 % (ref 36.0–46.0)
Hemoglobin: 13.9 g/dL (ref 12.0–15.0)
LYMPHS ABS: 3.2 10*3/uL (ref 0.7–4.0)
LYMPHS PCT: 56 % — AB (ref 12–46)
MCH: 30.9 pg (ref 26.0–34.0)
MCHC: 34.3 g/dL (ref 30.0–36.0)
MCV: 90 fL (ref 78.0–100.0)
MONOS PCT: 6 % (ref 3–12)
MPV: 11.4 fL (ref 8.6–12.4)
Monocytes Absolute: 0.3 10*3/uL (ref 0.1–1.0)
NEUTROS ABS: 1.8 10*3/uL (ref 1.7–7.7)
NEUTROS PCT: 32 % — AB (ref 43–77)
Platelets: 277 10*3/uL (ref 150–400)
RBC: 4.5 MIL/uL (ref 3.87–5.11)
RDW: 14.6 % (ref 11.5–15.5)
WBC: 5.7 10*3/uL (ref 4.0–10.5)

## 2014-06-12 LAB — SEDIMENTATION RATE: Sed Rate: 9 mm/hr (ref 0–22)

## 2014-06-13 ENCOUNTER — Encounter: Payer: Self-pay | Admitting: *Deleted

## 2014-08-05 ENCOUNTER — Telehealth: Payer: Self-pay | Admitting: Nurse Practitioner

## 2014-08-05 NOTE — Telephone Encounter (Signed)
Patient is calling as her Rx Maxalt tabs cannot be renewed until 09/03/14. Is there another medication that can be prescribed until that time?  [facility].  Please call.

## 2014-08-06 NOTE — Telephone Encounter (Signed)
I called the patient back.  Says she has taken 29 of the 30 Maxalt tablets that were last sent to her by mail order.  Because each fill is considered a 90 day supply, they will not refill until 03/29.  States on average she has been taking one tablet every other day.  Indicates she gets a headache if she goes longer than 3-4 days without Maxalt.  Says she is taking Topamax as prescribed and has not tried OTC meds.  Understands ins will likely not cover alternate triptan.  She would like to know what provider recommends to bridge until she is able to get another refill.  Please advise.  Thank you.

## 2014-08-06 NOTE — Telephone Encounter (Signed)
Please see why pt cant get refills.

## 2014-08-06 NOTE — Telephone Encounter (Signed)
I would increase her preventive medication first to 50mg  in the morning and 100mg  at hs. If this does not help we need to change her triptan to something else because of the quanity she is using.

## 2014-08-06 NOTE — Telephone Encounter (Signed)
I called the patient back.  Got no answer.  Left message. 

## 2014-08-07 ENCOUNTER — Other Ambulatory Visit: Payer: Self-pay

## 2014-08-07 MED ORDER — TOPIRAMATE 50 MG PO TABS: ORAL_TABLET | ORAL | Status: DC

## 2014-08-07 NOTE — Telephone Encounter (Signed)
I called back and spoke with the patient.  She would like a small Rx sent to local pharmacy for new dose.  She will call us back if anything further is needed.

## 2014-08-07 NOTE — Telephone Encounter (Signed)
Patient returning Jessica, [place] call, requested a return call to home # [phone].

## 2014-08-22 ENCOUNTER — Other Ambulatory Visit: Payer: Self-pay | Admitting: Family Medicine

## 2014-08-29 ENCOUNTER — Encounter: Payer: Self-pay | Admitting: Physician Assistant

## 2014-08-29 ENCOUNTER — Ambulatory Visit (INDEPENDENT_AMBULATORY_CARE_PROVIDER_SITE_OTHER): Admitting: Physician Assistant

## 2014-08-29 VITALS — BP 114/70 | HR 68 | Temp 98.1°F | Resp 18 | Wt 199.0 lb

## 2014-08-29 DIAGNOSIS — J4521 Mild intermittent asthma with (acute) exacerbation: Secondary | ICD-10-CM

## 2014-08-29 DIAGNOSIS — J309 Allergic rhinitis, unspecified: Secondary | ICD-10-CM

## 2014-08-29 MED ORDER — BECLOMETHASONE DIPROPIONATE 80 MCG/ACT IN AERS: 1.0000 | INHALATION_SPRAY | Freq: Two times a day (BID) | RESPIRATORY_TRACT | Status: DC

## 2014-08-29 MED ORDER — CETIRIZINE HCL 10 MG PO TABS: 10.0000 mg | ORAL_TABLET | Freq: Every day | ORAL | Status: DC

## 2014-08-29 MED ORDER — MONTELUKAST SODIUM 10 MG PO TABS: 10.0000 mg | ORAL_TABLET | Freq: Every day | ORAL | Status: DC

## 2014-08-29 NOTE — Progress Notes (Signed)
Patient ID: Claudius Sisorma A Votaw MRN: [id number], DOB: 09/29/1950, 64 y.o. Date of Encounter: 08/29/2014, 1:38 PM    Chief Complaint:  Chief Complaint  Patient presents with  . c/o wheezing/sneezing    tired, SOB, congestion  . Medication Refill    refill maxalt     HPI: 64 y.o. year old AA female states that she has been having these symptoms for about one week.  Says that she is getting a lot of watery clear drainage from her nose. Also a lot of sneezing.  Says that she has been having wheezing which has been getting a little worse each day over the last week. Has been using her pro-air every 4 hours especially at night--has been using it some for about 10 days now. Has Flonase on her medicine list but she says she has not been using that. Says she did buy some Benadryl and has been taking that daily for a few days.  States in the past she had used Claritin. She saw an allergist in the past and  has been on Claritin Flonase and pro-air. Also had used allergy shots remotely. Asked about Singulair and she says that yes she was on that years ago but  thinks it just ran out.  Says that she is allergic to cats and that there has been a cat around her house recently and she thinks that may also be aggravating the symptoms in addition to the pollen and blooms of the season change.     Home Meds:   Outpatient Prescriptions Prior to Visit  Medication Sig Dispense Refill  . buPROPion (WELLBUTRIN XL) 150 MG 24 hr tablet TAKE 1 TABLET DAILY 90 tablet 2  . Calcium Carbonate-Vitamin D (CALCIUM 600+D) 600-400 MG-UNIT per tablet Take 1 tablet by mouth 2 (two) times daily.     . clotrimazole-betamethasone (LOTRISONE) cream Apply 1 application topically 2 (two) times daily. 30 g 0  . estradiol (CLIMARA - DOSED IN MG/24 HR) 0.025 mg/24hr patch APPLY 1 PATCH ONTO THE SKIN ONCE A WEEK 12 patch 2  . FLUOCINOLONE ACETONIDE SCALP 0.01 % OIL Massage into scalp, cover with shower cap and rinse off after  4 hours (1 x per week) 1 Bottle 1  . fluticasone (FLONASE) 50 MCG/ACT nasal spray Place 2 sprays into the nose daily. 16 g 6  . glucosamine-chondroitin 500-400 MG tablet Take 1 tablet by mouth 3 (three) times daily.    Marland Kitchen. levothyroxine (SYNTHROID, LEVOTHROID) 112 MCG tablet TAKE 1 TABLET DAILY 90 tablet 2  . mirtazapine (REMERON) 30 MG tablet Take 1 tablet (30 mg total) by mouth at bedtime. 90 tablet 0  . omeprazole (PRILOSEC) 20 MG capsule TAKE 1 CAPSULE DAILY 90 capsule 2  . PROAIR HFA 108 (90 BASE) MCG/ACT inhaler INHALE 2 PUFFS INTO THE LUNGS EVERY 6 HOURS AS NEEDED FOR WHEEZING 7 each 3  . propranolol ER (INDERAL LA) 60 MG 24 hr capsule TAKE 1 CAPSULE DAILY 90 capsule 2  . rizatriptan (MAXALT) 10 MG tablet Take 1 tablet (10 mg total) by mouth as needed for migraine. May repeat in 2 hours if needed 30 tablet 3  . topiramate (TOPAMAX) 50 MG tablet 50mg  in the morning and 100mg  at bedtime 90 tablet 1  . hydrOXYzine (ATARAX/VISTARIL) 25 MG tablet Take 1 tablet (25 mg total) by mouth 3 (three) times daily as needed for itching. (Patient not taking: Reported on 06/11/2014) 30 tablet 0  . traZODone (DESYREL) 50 MG tablet Take 1 tablet (  50 mg total) by mouth at bedtime as needed for sleep. (Patient not taking: Reported on 08/29/2014) 90 tablet 0  . topiramate (TOPAMAX) 50 MG tablet TAKE 1 TABLET TWICE A DAY 180 tablet 2   No facility-administered medications prior to visit.    Allergies:  Allergies  Allergen Reactions  . Sulfamethoxazole-Trimethoprim Swelling  . Metronidazole Itching and Rash      Review of Systems: See HPI for pertinent ROS. All other ROS negative.    Physical Exam: Blood pressure 114/70, pulse 68, temperature 98.1 F (36.7 C), temperature source Oral, resp. rate 18, weight 199 lb (90.266 kg)., Body mass index is 32.13 kg/(m^2). General:  Overweight AAF. Appears in no acute distress. HEENT: Normocephalic, atraumatic, eyes without discharge, sclera non-icteric, nares are  without discharge. Bilateral auditory canals clear, TM's are without perforation, pearly grey and translucent with reflective cone of light bilaterally. Oral cavity moist, posterior pharynx without exudate, erythema, peritonsillar abscess. No sinus tenderness with percussion of frontal and maxillary sinuses bilaterally.  Neck: Supple. No thyromegaly. No lymphadenopathy. Lungs: Very, very slight wheeze at the very end of expiration just at the left lower base. Remainder of exam is normal with clear lungs and good air movement and breath sounds throughout. Heart: Regular rhythm. No murmurs, rubs, or gallops. Msk:  Strength and tone normal for age. Extremities/Skin: Warm and dry.  Neuro: Alert and oriented X 3. Moves all extremities spontaneously. Gait is normal. CNII-XII grossly in tact. Psych:  Responds to questions appropriately with a normal affect.     ASSESSMENT AND PLAN:  64 y.o. year old female with  1. Allergic rhinitis, unspecified allergic rhinitis type - cetirizine (ZYRTEC) 10 MG tablet; Take 1 tablet (10 mg total) by mouth daily.  Dispense: 30 tablet; Refill: 11 - montelukast (SINGULAIR) 10 MG tablet; Take 1 tablet (10 mg total) by mouth at bedtime.  Dispense: 30 tablet; Refill: 3  2. Asthma with acute exacerbation, mild intermittent - montelukast (SINGULAIR) 10 MG tablet; Take 1 tablet (10 mg total) by mouth at bedtime.  Dispense: 30 tablet; Refill: 3 - beclomethasone (QVAR) 80 MCG/ACT inhaler; Inhale 1 puff into the lungs 2 (two) times daily.  Dispense: 1 Inhaler; Refill: 12  Will treat with the above medications. Discussed with her proper use of the Qvar versus the pro-air and discussed the role of each. Follow-up if symptoms worsen or are not controlled over the next week.  [address] [place], [place], [facility] 08/29/2014 1:38 PM

## 2014-09-03 ENCOUNTER — Telehealth: Payer: Self-pay | Admitting: Family Medicine

## 2014-09-03 NOTE — Telephone Encounter (Signed)
PA for Qvar sent through "CoverMyMeds" case [id number].

## 2014-09-05 ENCOUNTER — Telehealth: Payer: Self-pay | Admitting: Nurse Practitioner

## 2014-09-05 NOTE — Telephone Encounter (Signed)
Patient still needs her prescription because she's out, still having problems with headaches. [facility] said [facility] couldn't refill her prescription until June 20 but they never sent any prescription to begin with because she never received any of it on March 29. They said they were going to send some on March 29 but they never did. Best number to contact her is [phone] and you can leave a detail message on the answer machine.

## 2014-09-05 NOTE — Telephone Encounter (Signed)
I called the patent to clarify.  Uncertain which medication she is referring to? Got no answer.  Left message.

## 2014-09-10 MED ORDER — FLUTICASONE PROPIONATE HFA 110 MCG/ACT IN AERO: 1.0000 | INHALATION_SPRAY | Freq: Two times a day (BID) | RESPIRATORY_TRACT | Status: DC

## 2014-09-10 NOTE — Telephone Encounter (Signed)
Flovent HFA 110 g

## 2014-09-10 NOTE — Telephone Encounter (Signed)
New order sent.

## 2014-09-10 NOTE — Telephone Encounter (Signed)
QVAR  denied.  Pt must have tried and failed Flovent HFA or Flovent diskus.  Flovent HFA come in three strenghths.  Please advise.

## 2014-09-23 ENCOUNTER — Telehealth: Payer: Self-pay | Admitting: Family Medicine

## 2014-09-23 NOTE — Telephone Encounter (Signed)
Patient is calling to say that her gyn moved out of state and would like recommendations about what gyn office she could see that is only female, about 3 providers in the office and will take her tricare  Please call her at [phone]

## 2014-09-24 NOTE — Telephone Encounter (Signed)
lmtrc

## 2014-09-30 NOTE — Telephone Encounter (Signed)
Contacted pt and was given information  About recommendations

## 2014-10-03 ENCOUNTER — Telehealth: Payer: Self-pay | Admitting: Family Medicine

## 2014-10-03 DIAGNOSIS — J309 Allergic rhinitis, unspecified: Secondary | ICD-10-CM

## 2014-10-03 DIAGNOSIS — J4521 Mild intermittent asthma with (acute) exacerbation: Secondary | ICD-10-CM

## 2014-10-03 MED ORDER — FLUTICASONE PROPIONATE HFA 110 MCG/ACT IN AERO: 1.0000 | INHALATION_SPRAY | Freq: Two times a day (BID) | RESPIRATORY_TRACT | Status: DC

## 2014-10-03 MED ORDER — MONTELUKAST SODIUM 10 MG PO TABS: 10.0000 mg | ORAL_TABLET | Freq: Every day | ORAL | Status: DC

## 2014-10-03 NOTE — Telephone Encounter (Signed)
Medication refilled per protocol. 

## 2014-10-08 ENCOUNTER — Encounter: Payer: Self-pay | Admitting: Family Medicine

## 2014-10-08 ENCOUNTER — Ambulatory Visit (INDEPENDENT_AMBULATORY_CARE_PROVIDER_SITE_OTHER): Admitting: Family Medicine

## 2014-10-08 VITALS — BP 110/80 | HR 78 | Temp 98.0°F | Resp 20 | Ht 66.0 in | Wt 202.0 lb

## 2014-10-08 DIAGNOSIS — K30 Functional dyspepsia: Secondary | ICD-10-CM

## 2014-10-08 DIAGNOSIS — R1013 Epigastric pain: Principal | ICD-10-CM

## 2014-10-08 DIAGNOSIS — K319 Disease of stomach and duodenum, unspecified: Secondary | ICD-10-CM

## 2014-10-08 NOTE — Progress Notes (Signed)
Subjective:    Patient ID: [facility], female    DOB: [birth year], 64 y.o.   MRN: [id number]  HPI  patient recently stopped her omeprazole. At approximately the same time, the patient developed a rumbling sensation in her stomach on a nightly basis. She reports an "unsettled"  Feeling in her stomach on a nightly basis. She denies any melena or hematochezia. She denies any abdominal pain. She denies any  Diarrhea. She denies any fevers or chills or weight loss. She also has occasional vaginal itching. The symptoms together have the patient concern that she may have a parasite. She denies any travel outside the country. She denies drinking any contaminated creek water. She denies eating any  Food that would be at high risk for fecal contamination.   Past Medical History  Diagnosis Date  . Goiter   . Asthma   . Allergic rhinitis   . Depression   . Goiter   . LKGMWNUU(725.3)    Past Surgical History  Procedure Laterality Date  . Knee surgery    . Thyroidectomy    . Abdominal hysterectomy    . Cholecystectomy N/A 11/02/2013    Procedure: LAPAROSCOPIC CHOLECYSTECTOMY ;  Surgeon: Cherylynn Ridges, MD;  Location: [facility] OR;  Service: General;  Laterality: N/A;   Current Outpatient Prescriptions on File Prior to Visit  Medication Sig Dispense Refill  . buPROPion (WELLBUTRIN XL) 150 MG 24 hr tablet TAKE 1 TABLET DAILY 90 tablet 2  . Calcium Carbonate-Vitamin D (CALCIUM 600+D) 600-400 MG-UNIT per tablet Take 1 tablet by mouth 2 (two) times daily.     . cetirizine (ZYRTEC) 10 MG tablet Take 1 tablet (10 mg total) by mouth daily. 30 tablet 11  . clotrimazole-betamethasone (LOTRISONE) cream Apply 1 application topically 2 (two) times daily. 30 g 0  . estradiol (CLIMARA - DOSED IN MG/24 HR) 0.025 mg/24hr patch APPLY 1 PATCH ONTO THE SKIN ONCE A WEEK 12 patch 2  . FLUOCINOLONE ACETONIDE SCALP 0.01 % OIL Massage into scalp, cover with shower cap and rinse off after 4 hours (1 x per week) 1 Bottle 1  .  fluticasone (FLONASE) 50 MCG/ACT nasal spray Place 2 sprays into the nose daily. 16 g 6  . fluticasone (FLOVENT HFA) 110 MCG/ACT inhaler Inhale 1 puff into the lungs 2 (two) times daily. 3 Inhaler 3  . glucosamine-chondroitin 500-400 MG tablet Take 1 tablet by mouth 3 (three) times daily.    Marland Kitchen levothyroxine (SYNTHROID, LEVOTHROID) 112 MCG tablet TAKE 1 TABLET DAILY 90 tablet 2  . mirtazapine (REMERON) 30 MG tablet Take 1 tablet (30 mg total) by mouth at bedtime. 90 tablet 0  . montelukast (SINGULAIR) 10 MG tablet Take 1 tablet (10 mg total) by mouth at bedtime. 90 tablet 3  . omeprazole (PRILOSEC) 20 MG capsule TAKE 1 CAPSULE DAILY 90 capsule 2  . PROAIR HFA 108 (90 BASE) MCG/ACT inhaler INHALE 2 PUFFS INTO THE LUNGS EVERY 6 HOURS AS NEEDED FOR WHEEZING 7 each 3  . propranolol ER (INDERAL LA) 60 MG 24 hr capsule TAKE 1 CAPSULE DAILY 90 capsule 2  . rizatriptan (MAXALT) 10 MG tablet Take 1 tablet (10 mg total) by mouth as needed for migraine. May repeat in 2 hours if needed 30 tablet 3  . topiramate (TOPAMAX) 50 MG tablet  in the morning and  at bedtime 90 tablet 1  . hydrOXYzine (ATARAX/VISTARIL) 25 MG tablet Take 1 tablet (25 mg total) by mouth 3 (three) times daily as needed  for itching. (Patient not taking: Reported on 06/11/2014) 30 tablet 0   No current facility-administered medications on file prior to visit.   Allergies  Allergen Reactions  . Sulfamethoxazole-Trimethoprim Swelling  . Metronidazole Itching and Rash   History   Social History  . Marital Status: Married    Spouse Name: Brooke Schneider  . Number of Children: 2  . Years of Education: college   Occupational History  . housewife    Social History Main Topics  . Smoking status: Passive Smoke Exposure - Never Smoker  . Smokeless tobacco: Never Used     Comment: husband smoking cigars  . Alcohol Use: Yes     Comment: occ  . Drug Use: No  . Sexual Activity: Not Currently   Other Topics Concern  . Not on file    Social History Narrative   Patient lives at home with her husband Brooke Diener(Alan).   Retired.   Education college B.S.   Right handed.   Caffeine one cup of coffee daily.      Review of Systems  All other systems reviewed and are negative.      Objective:   Physical Exam  Constitutional: She appears well-developed and well-nourished. No distress.  Cardiovascular: Normal rate, regular rhythm and normal heart sounds.  Exam reveals no gallop and no friction rub.   No murmur heard. Pulmonary/Chest: Effort normal and breath sounds normal. No respiratory distress. She has no wheezes. She has no rales. She exhibits no tenderness.  Abdominal: Soft. Bowel sounds are normal. She exhibits no distension and no mass. There is no tenderness. There is no rebound and no guarding.  Skin: She is not diaphoretic.  Vitals reviewed.         Assessment & Plan:  Dyspepsia and disorder of function of stomach   I tried to reassure the patient that she is at very low risk for contracting any intestinal parasite. Furthermore her symptoms are not consistent with an intestinal parasite. It should not cause vaginal itching. I did offer the patient stool ova and parasite examination but she declined this after my reassurance. I believe the dyspepsia is likely to her recent discontinuation of omeprazole and I asked the patient to resume omeprazole. The vaginal itching is likely either from a yeast infection, bacterial vaginosis, or possibly lichen sclerosis. I offered the patient a vaginal exam but she declined this. She prefers to see her gynecologist for this examination. Patient will resume the omeprazole and call the back of intestinal problems do not improve.

## 2014-10-09 ENCOUNTER — Encounter: Payer: Self-pay | Admitting: Family Medicine

## 2014-10-21 ENCOUNTER — Other Ambulatory Visit: Payer: Self-pay | Admitting: Family Medicine

## 2014-10-21 NOTE — Telephone Encounter (Signed)
Medication refilled per protocol. 

## 2014-11-11 ENCOUNTER — Encounter: Payer: Self-pay | Admitting: Family Medicine

## 2014-12-12 ENCOUNTER — Encounter: Payer: Self-pay | Admitting: Family Medicine

## 2015-01-21 ENCOUNTER — Ambulatory Visit (INDEPENDENT_AMBULATORY_CARE_PROVIDER_SITE_OTHER): Admitting: Family Medicine

## 2015-01-21 ENCOUNTER — Encounter: Payer: Self-pay | Admitting: Family Medicine

## 2015-01-21 VITALS — BP 110/78 | HR 78 | Temp 98.2°F | Resp 18 | Ht 66.0 in | Wt 204.0 lb

## 2015-01-21 DIAGNOSIS — K589 Irritable bowel syndrome without diarrhea: Secondary | ICD-10-CM | POA: Diagnosis not present

## 2015-01-21 MED ORDER — DICYCLOMINE HCL 20 MG PO TABS: 20.0000 mg | ORAL_TABLET | Freq: Three times a day (TID) | ORAL | Status: DC

## 2015-01-21 NOTE — Progress Notes (Signed)
Subjective:    Patient ID: Brooke Schneider, female    DOB: Oct 21, 1950, 64 y.o.   MRN: [id number]  HPI 10/08/14  patient recently stopped her omeprazole. At approximately the same time, the patient developed a rumbling sensation in her stomach on a nightly basis. She reports an "unsettled"  Feeling in her stomach on a nightly basis. She denies any melena or hematochezia. She denies any abdominal pain. She denies any  Diarrhea. She denies any fevers or chills or weight loss. She also has occasional vaginal itching. The symptoms together have the patient concern that she may have a parasite. She denies any travel outside the country. She denies drinking any contaminated creek water. She denies eating any  Food that would be at high risk for fecal contamination.  At that time, my plan was:  I tried to reassure the patient that she is at very low risk for contracting any intestinal parasite. Furthermore her symptoms are not consistent with an intestinal parasite. It should not cause vaginal itching. I did offer the patient stool ova and parasite examination but she declined this after my reassurance. I believe the dyspepsia is likely to her recent discontinuation of omeprazole and I asked the patient to resume omeprazole. The vaginal itching is likely either from a yeast infection, bacterial vaginosis, or possibly lichen sclerosis. I offered the patient a vaginal exam but she declined this. She prefers to see her gynecologist for this examination. Patient will resume the omeprazole and call the back of intestinal problems do not improve.  01/21/15  patient states that she continues to have rumbling discomfort in her abdomen. However it is now in her lower abdomen in the left lower quadrant and in the right lower quadrant. It occurs most days some more severe than others. She is also had intermittent diarrhea. On July 4 she had profound profuse watery diarrhea. Over the last 7 days she is also had green and  watery diarrhea. This tends to fluctuate. Over the time  Since I last saw her, she denies any weight loss, fever, melena, hematochezia, nausea, or vomiting. In fact she has gained 2 pounds since her last office visit. Past Medical History  Diagnosis Date  . Goiter   . Asthma   . Allergic rhinitis   . Depression   . Goiter   . BJYNWGNF(621.3)    Past Surgical History  Procedure Laterality Date  . Knee surgery    . Thyroidectomy    . Abdominal hysterectomy    . Cholecystectomy N/A 11/02/2013    Procedure: LAPAROSCOPIC CHOLECYSTECTOMY ;  Surgeon: Cherylynn Ridges, MD;  Location: [facility] OR;  Service: General;  Laterality: N/A;   Current Outpatient Prescriptions on File Prior to Visit  Medication Sig Dispense Refill  . buPROPion (WELLBUTRIN XL) 150 MG 24 hr tablet TAKE 1 TABLET DAILY 90 tablet 2  . Calcium Carbonate-Vitamin D (CALCIUM 600+D) 600-400 MG-UNIT per tablet Take 1 tablet by mouth 2 (two) times daily.     . clotrimazole-betamethasone (LOTRISONE) cream Apply 1 application topically 2 (two) times daily. 30 g 0  . estradiol (CLIMARA - DOSED IN MG/24 HR) 0.025 mg/24hr patch APPLY 1 PATCH ONTO THE SKIN ONCE A WEEK 12 patch 2  . FLUOCINOLONE ACETONIDE SCALP 0.01 % OIL Massage into scalp, cover with shower cap and rinse off after 4 hours (1 x per week) 1 Bottle 1  . fluticasone (FLONASE) 50 MCG/ACT nasal spray Place 2 sprays into the nose daily. 16 g 6  .  levothyroxine (SYNTHROID, LEVOTHROID) 112 MCG tablet TAKE 1 TABLET DAILY 90 tablet 2  . MAXALT 10 MG tablet TAKE 1 TABLET ONCE DAILY FOR MIGRAINES 90 tablet 1  . montelukast (SINGULAIR) 10 MG tablet Take 1 tablet (10 mg total) by mouth at bedtime. 90 tablet 3  . omeprazole (PRILOSEC) 20 MG capsule TAKE 1 CAPSULE DAILY 90 capsule 2  . PROAIR HFA 108 (90 BASE) MCG/ACT inhaler INHALE 2 PUFFS INTO THE LUNGS EVERY 6 HOURS AS NEEDED FOR WHEEZING 7 each 3  . propranolol ER (INDERAL LA) 60 MG 24 hr capsule TAKE 1 CAPSULE DAILY 90 capsule 2  .  rizatriptan (MAXALT) 10 MG tablet Take 1 tablet (10 mg total) by mouth as needed for migraine. May repeat in 2 hours if needed 30 tablet 3  . topiramate (TOPAMAX) 50 MG tablet 50mg  in the morning and 100mg  at bedtime 90 tablet 1   No current facility-administered medications on file prior to visit.   Allergies  Allergen Reactions  . Sulfamethoxazole-Trimethoprim Swelling  . Metronidazole Itching and Rash   Social History   Social History  . Marital Status: Married    Spouse Name: [name]  . Number of Children: 2  . Years of Education: college   Occupational History  . housewife    Social History Main Topics  . Smoking status: Passive Smoke Exposure - Never Smoker  . Smokeless tobacco: Never Used     Comment: husband smoking cigars  . Alcohol Use: Yes     Comment: occ  . Drug Use: No  . Sexual Activity: Not Currently   Other Topics Concern  . Not on file   Social History Narrative   Patient lives at home with her husband [name]).   Retired.   Education college B.S.   Right handed.   Caffeine one cup of coffee daily.      Review of Systems  All other systems reviewed and are negative.      Objective:   Physical Exam  Constitutional: She appears well-developed and well-nourished. No distress.  Cardiovascular: Normal rate, regular rhythm and normal heart sounds.  Exam reveals no gallop and no friction rub.   No murmur heard. Pulmonary/Chest: Effort normal and breath sounds normal. No respiratory distress. She has no wheezes. She has no rales. She exhibits no tenderness.  Abdominal: Soft. Bowel sounds are normal. She exhibits no distension and no mass. There is no tenderness. There is no rebound and no guarding.  Skin: She is not diaphoretic.  Vitals reviewed.         Assessment & Plan:  IBS (irritable bowel syndrome) - Plan: Ova and parasite examination, Stool culture, Fecal occult blood, imunochemical, dicyclomine (BENTYL) 20 MG tablet  I believe the  patient's symptoms are more canned irritable bowel syndrome. I would like her to try Bentyl 20 mg by mouth every 6 hours when necessary. In the meantime I will check stool O&P, stool cultures, and fecal occult blood testing to rule out infection, parasitic infection, or evidence of inflammatory bowel disease. If symptoms persist consider a GI consultation for colonoscopy.

## 2015-01-23 ENCOUNTER — Other Ambulatory Visit

## 2015-01-23 DIAGNOSIS — K589 Irritable bowel syndrome without diarrhea: Secondary | ICD-10-CM

## 2015-01-24 LAB — OVA AND PARASITE EXAMINATION: OP: NONE SEEN

## 2015-01-24 LAB — FECAL OCCULT BLOOD, IMMUNOCHEMICAL: FECAL OCCULT BLOOD: NEGATIVE

## 2015-01-27 ENCOUNTER — Other Ambulatory Visit: Payer: Self-pay | Admitting: Family Medicine

## 2015-01-27 LAB — STOOL CULTURE

## 2015-01-30 ENCOUNTER — Ambulatory Visit: Admitting: Nurse Practitioner

## 2015-02-03 ENCOUNTER — Ambulatory Visit (INDEPENDENT_AMBULATORY_CARE_PROVIDER_SITE_OTHER): Admitting: Nurse Practitioner

## 2015-02-03 ENCOUNTER — Encounter: Payer: Self-pay | Admitting: Nurse Practitioner

## 2015-02-03 VITALS — BP 100/69 | HR 75 | Ht 66.0 in | Wt 202.8 lb

## 2015-02-03 DIAGNOSIS — G43909 Migraine, unspecified, not intractable, without status migrainosus: Secondary | ICD-10-CM | POA: Diagnosis not present

## 2015-02-03 MED ORDER — TOPIRAMATE 50 MG PO TABS: ORAL_TABLET | ORAL | Status: DC

## 2015-02-03 MED ORDER — RIZATRIPTAN BENZOATE 10 MG PO TABS: 10.0000 mg | ORAL_TABLET | ORAL | Status: DC | PRN

## 2015-02-03 NOTE — Progress Notes (Addendum)
[facility]  PATIENT: Brooke Schneider DOB: 1950/09/30   REASON FOR VISIT: Follow-up for headaches HISTORY FROM: Patient    HISTORY OF PRESENT ILLNESS:[name], 64 year old female returns for followup.  She has a history of headaches since June 2012 starting at the mid left parietal area going to the frontal area and then will spread across the whole frontal area sometimes she will have pain on her ear and down the left side of her neck. The pain was initially like a sharp pain which would last for 2-3 hours, she was taking Maxalt with relief.  She is also taking Topamax twice daily. She did have some mild blurred vision and noise discomfort with these headaches initially. Denies photophobia, nausea, vomiting or dizziness.  Headaches occur about 2 every  Week and some weeks not any. Her Topamax was increased in February 2016 to 50 mg in the morning and 100 at night. She feels that her current regimen is working well and that the headaches that she has are mild. She has retired from Engineer, manufacturing. She has a mother living who has memory loss and she says this is stressful for her. She returns for reevaluation    REVIEW OF SYSTEMS: Full 14 system review of systems performed and notable only for those listed, all others are neg:  Constitutional: Fatigue Cardiovascular: neg Ear/Nose/Throat: neg  Skin: neg Eyes: neg Respiratory: Shortness of breath Gastroitestinal: Diarrhea Hematology/Lymphatic: neg  Endocrine: neg Musculoskeletal:neg Allergy/Immunology: neg Neurological: Headaches Psychiatric: Decreased concentration Sleep : neg   ALLERGIES: Allergies  Allergen Reactions  . Sulfamethoxazole-Trimethoprim Swelling  . Metronidazole Itching and Rash    HOME MEDICATIONS: Outpatient Prescriptions Prior to Visit  Medication Sig Dispense Refill  . buPROPion (WELLBUTRIN XL) 150 MG 24 hr tablet TAKE 1 TABLET DAILY 90 tablet 2  . Calcium  Carbonate-Vitamin D (CALCIUM 600+D) 600-400 MG-UNIT per tablet Take 1 tablet by mouth 2 (two) times daily.     . clotrimazole-betamethasone (LOTRISONE) cream Apply 1 application topically 2 (two) times daily. 30 g 0  . dicyclomine (BENTYL) 20 MG tablet Take 1 tablet (20 mg total) by mouth 4 (four) times daily -  before meals and at bedtime. 30 tablet 0  . estradiol (CLIMARA - DOSED IN MG/24 HR) 0.025 mg/24hr patch APPLY 1 PATCH ONTO THE SKIN ONCE A WEEK 12 patch 5  . FLOVENT HFA 110 MCG/ACT inhaler Inhale 1 puff into the lungs daily.     Marland Kitchen FLUOCINOLONE ACETONIDE SCALP 0.01 % OIL Massage into scalp, cover with shower cap and rinse off after 4 hours (1 x per week) 1 Bottle 1  . fluticasone (FLONASE) 50 MCG/ACT nasal spray Place 2 sprays into the nose daily. 16 g 6  . levothyroxine (SYNTHROID, LEVOTHROID) 112 MCG tablet TAKE 1 TABLET DAILY 90 tablet 2  . montelukast (SINGULAIR) 10 MG tablet Take 1 tablet (10 mg total) by mouth at bedtime. 90 tablet 3  . omeprazole (PRILOSEC) 20 MG capsule TAKE 1 CAPSULE DAILY 90 capsule 2  . PROAIR HFA 108 (90 BASE) MCG/ACT inhaler INHALE 2 PUFFS INTO THE LUNGS EVERY 6 HOURS AS NEEDED FOR WHEEZING 7 each 3  . propranolol ER (INDERAL LA) 60 MG 24 hr capsule TAKE 1 CAPSULE DAILY 90 capsule 2  . rizatriptan (MAXALT) 10 MG tablet Take 1 tablet (10 mg total) by mouth as needed for migraine. May repeat in 2 hours if needed 30 tablet 3  . topiramate (TOPAMAX) 50 MG tablet 50mg  in the morning and  100mg  at bedtime 90 tablet 1  . MAXALT 10 MG tablet TAKE 1 TABLET ONCE DAILY FOR MIGRAINES 90 tablet 1   No facility-administered medications prior to visit.    PAST MEDICAL HISTORY: Past Medical History  Diagnosis Date  . Goiter   . Asthma   . Allergic rhinitis   . Depression   . Goiter   . Headache(784.0)     PAST SURGICAL HISTORY: Past Surgical History  Procedure Laterality Date  . Knee surgery    . Thyroidectomy    . Abdominal hysterectomy    . Cholecystectomy  N/A 11/02/2013    Procedure: LAPAROSCOPIC CHOLECYSTECTOMY ;  Surgeon: Cherylynn Ridges, MD;  Location: [facility] OR;  Service: General;  Laterality: N/A;    FAMILY HISTORY: Family History  Problem Relation Age of Onset  . Allergies Mother   . ALS Father   . Prostate cancer Father     SOCIAL HISTORY: Social History   Social History  . Marital Status: Married    Spouse Name: [name]  . Number of Children: 2  . Years of Education: college   Occupational History  . housewife    Social History Main Topics  . Smoking status: Passive Smoke Exposure - Never Smoker  . Smokeless tobacco: Never Used     Comment: husband smoking cigars  . Alcohol Use: Yes     Comment: occ  . Drug Use: No  . Sexual Activity: Not Currently   Other Topics Concern  . Not on file   Social History Narrative   Patient lives at home with her husband [name]).   Retired.   Education college B.S.   Right handed.   Caffeine one cup of coffee daily.     PHYSICAL EXAM  Filed Vitals:   02/03/15 1417  BP: 100/69  Pulse: 75  Height: 5\' 6"  (1.676 m)  Weight: 202 lb 12.8 oz (91.989 kg)   Body mass index is 32.75 kg/(m^2). General: well developed, well nourished, seated, in no evident distress  Head: head normocephalic and atraumatic. Oropharynx benign  Neck: supple with no carotid bruits  Cardiovascular: regular rate and rhythm, no murmurs  Neurologic Exam  Mental Status: Awake and fully alert. Oriented to place and time. Follows all commands. Speech and language normal.  Cranial Nerves: Pupils equal, briskly reactive to light. Extraocular movements full without nystagmus. Visual fields full to confrontation. Hearing intact and symmetric to finger snap. Facial sensation intact. Face, tongue, palate move normally and symmetrically. Neck flexion and extension normal.  Motor: Normal bulk and tone. Normal strength in all tested extremity muscles.No focal weakness  Coordination: Rapid alternating movements normal  in all extremities. Finger-to-nose and heel-to-shin performed accurately bilaterally.  Gait and Station: Arises from chair without difficulty. Stance is normal. . Able to heel, toe and tandem walk without difficulty.  Reflexes: 2+ and symmetric. Toes downgoing.  DIAGNOSTIC DATA (LABS, IMAGING, TESTING) - I reviewed patient records, labs, notes, testing and imaging myself where available.  Lab Results  Component Value Date   WBC 5.7 06/11/2014   HGB 13.9 06/11/2014   HCT 40.5 06/11/2014   MCV 90.0 06/11/2014   PLT 277 06/11/2014      Component Value Date/Time   NA 140 06/11/2014 1534   K 4.0 06/11/2014 1534   CL 107 06/11/2014 1534   CO2 22 06/11/2014 1534   GLUCOSE 101* 06/11/2014 1534   BUN 12 06/11/2014 1534   CREATININE 0.90 06/11/2014 1534   CREATININE 0.84 11/01/2013 1810  CALCIUM 8.5 06/11/2014 1534   PROT 7.1 06/11/2014 1534   ALBUMIN 4.0 06/11/2014 1534   AST 14 06/11/2014 1534   ALT 14 06/11/2014 1534   ALKPHOS 55 06/11/2014 1534   BILITOT 0.3 06/11/2014 1534   GFRNONAA 68 06/11/2014 1534   GFRNONAA 72* 11/01/2013 1810   GFRAA 79 06/11/2014 1534   GFRAA 84* 11/01/2013 1810    ASSESSMENT AND PLAN  64 y.o. year old female  has a past medical history of Asthma; Allergic rhinitis; Depression;  and Headache(784.0). here to follow-up for her headaches.The patient is a current patient of Dr. Terrace Arabia  who is out of the office today . This note is sent to the work in doctor.     Continue Topamax 50 mg in the am and  in the pm, will refill Continue Maxalt 10 mg when necessary acute migraine will refill Followup yearly and when necessary Nilda Riggs, [facility], [facility], APRN  [facility] [address] [place], [place] [address] [phone]  Personally participated in and made any corrections needed to history, physical, neuro exam,assessment and plan as stated above.  Naomie Dean, MD Stroke Neurology [phone] [facility]

## 2015-02-03 NOTE — Patient Instructions (Signed)
Continue Topamax 50 mg am  at night will  refill Continue Maxalt 10 mg when necessary acute migraine will refill Followup yearly and when necessary

## 2015-02-05 ENCOUNTER — Telehealth: Payer: Self-pay | Admitting: *Deleted

## 2015-02-05 DIAGNOSIS — K589 Irritable bowel syndrome without diarrhea: Secondary | ICD-10-CM

## 2015-02-05 NOTE — Telephone Encounter (Signed)
?   OK to Refill  

## 2015-02-05 NOTE — Telephone Encounter (Signed)
Pt calling needing refill on Bentyl 20 mg states that Dr. Tanya Nones told her when she ran out he would refill for her(?).  [facility] (would like for it to be sent today if possible)

## 2015-02-06 MED ORDER — DICYCLOMINE HCL 20 MG PO TABS: 20.0000 mg | ORAL_TABLET | Freq: Three times a day (TID) | ORAL | Status: DC

## 2015-02-06 NOTE — Telephone Encounter (Signed)
Medication called/sent to requested pharmacy  

## 2015-02-06 NOTE — Telephone Encounter (Signed)
Ok, 90 tabs with 3 refill

## 2015-03-06 ENCOUNTER — Ambulatory Visit (INDEPENDENT_AMBULATORY_CARE_PROVIDER_SITE_OTHER): Admitting: Family Medicine

## 2015-03-06 ENCOUNTER — Encounter: Payer: Self-pay | Admitting: Family Medicine

## 2015-03-06 VITALS — BP 128/80 | HR 78 | Temp 98.5°F | Resp 16 | Ht 66.0 in | Wt 199.0 lb

## 2015-03-06 DIAGNOSIS — M7551 Bursitis of right shoulder: Secondary | ICD-10-CM | POA: Diagnosis not present

## 2015-03-06 MED ORDER — DICLOFENAC SODIUM 75 MG PO TBEC: 75.0000 mg | DELAYED_RELEASE_TABLET | Freq: Two times a day (BID) | ORAL | Status: DC

## 2015-03-06 NOTE — Progress Notes (Signed)
Subjective:    Patient ID: Brooke Schneider, female    DOB: [birth year], 64 y.o.   MRN: [id number]  HPI Patient is a pleasant 64 year old African-American female who presents with one month of pain in her right shoulder. She has pain with abduction greater than 110. She has a positive empty can sign. She has a positive Hawkins sign. There is no crepitus and range of motion in the shoulder. She states that it aches and throbs at night. She denies any falls or any injuries. She has no neuropathic pain in the right arm. She denies any neck pain. Past Medical History  Diagnosis Date  . Goiter   . Asthma   . Allergic rhinitis   . Depression   . Goiter   . WUJWJXBJ(478.2)    Past Surgical History  Procedure Laterality Date  . Knee surgery    . Thyroidectomy    . Abdominal hysterectomy    . Cholecystectomy N/A 11/02/2013    Procedure: LAPAROSCOPIC CHOLECYSTECTOMY ;  Surgeon: Cherylynn Ridges, MD;  Location: [facility] OR;  Service: General;  Laterality: N/A;   Current Outpatient Prescriptions on File Prior to Visit  Medication Sig Dispense Refill  . buPROPion (WELLBUTRIN XL) 150 MG 24 hr tablet TAKE 1 TABLET DAILY 90 tablet 2  . Calcium Carbonate-Vitamin D (CALCIUM 600+D) 600-400 MG-UNIT per tablet Take 1 tablet by mouth 2 (two) times daily.     . clotrimazole-betamethasone (LOTRISONE) cream Apply 1 application topically 2 (two) times daily. 30 g 0  . dicyclomine (BENTYL) 20 MG tablet Take 1 tablet (20 mg total) by mouth 4 (four) times daily -  before meals and at bedtime. 90 tablet 3  . estradiol (CLIMARA - DOSED IN MG/24 HR) 0.025 mg/24hr patch APPLY 1 PATCH ONTO THE SKIN ONCE A WEEK 12 patch 5  . FLOVENT HFA 110 MCG/ACT inhaler Inhale 1 puff into the lungs daily.     Marland Kitchen FLUOCINOLONE ACETONIDE SCALP 0.01 % OIL Massage into scalp, cover with shower cap and rinse off after 4 hours (1 x per week) 1 Bottle 1  . fluticasone (FLONASE) 50 MCG/ACT nasal spray Place 2 sprays into the nose daily. 16 g 6  .  levothyroxine (SYNTHROID, LEVOTHROID) 112 MCG tablet TAKE 1 TABLET DAILY 90 tablet 2  . montelukast (SINGULAIR) 10 MG tablet Take 1 tablet (10 mg total) by mouth at bedtime. 90 tablet 3  . omeprazole (PRILOSEC) 20 MG capsule TAKE 1 CAPSULE DAILY 90 capsule 2  . PROAIR HFA 108 (90 BASE) MCG/ACT inhaler INHALE 2 PUFFS INTO THE LUNGS EVERY 6 HOURS AS NEEDED FOR WHEEZING 7 each 3  . propranolol ER (INDERAL LA) 60 MG 24 hr capsule TAKE 1 CAPSULE DAILY 90 capsule 2  . rizatriptan (MAXALT) 10 MG tablet Take 1 tablet (10 mg total) by mouth as needed for migraine. May repeat in 2 hours if needed 30 tablet 3  . topiramate (TOPAMAX) 50 MG tablet  in the morning and  at bedtime 270 tablet 3   No current facility-administered medications on file prior to visit.   Allergies  Allergen Reactions  . Sulfamethoxazole-Trimethoprim Swelling  . Metronidazole Itching and Rash   Social History   Social History  . Marital Status: Married    Spouse Name: [name]  . Number of Children: 2  . Years of Education: college   Occupational History  . housewife    Social History Main Topics  . Smoking status: Passive Smoke Exposure - Never Smoker  .  Smokeless tobacco: Never Used     Comment: husband smoking cigars  . Alcohol Use: Yes     Comment: occ  . Drug Use: No  . Sexual Activity: Not Currently   Other Topics Concern  . Not on file   Social History Narrative   Patient lives at home with her husband [name]).   Retired.   Education college B.S.   Right handed.   Caffeine one cup of coffee daily.      Review of Systems  All other systems reviewed and are negative.      Objective:   Physical Exam  Cardiovascular: Normal rate and regular rhythm.   Pulmonary/Chest: Effort normal and breath sounds normal.  Musculoskeletal:       Right shoulder: She exhibits decreased range of motion, tenderness and pain. She exhibits no bony tenderness, no swelling, no effusion, no crepitus, no deformity,  no spasm, normal pulse and normal strength.  Vitals reviewed.         Assessment & Plan:  Acute shoulder bursitis, right - Plan: diclofenac (VOLTAREN) 75 MG EC tablet  Patient has subacromial bursitis/rotator cuff tendinitis. I offered the patient a cortisone injection but she would like to try conservative therapy with diclofenac 75 mg by mouth twice a day first. I recommended no overhead activity and pendulum exercises to be performed every night. Recheck in 2-3 weeks if no better or sooner if worse

## 2015-05-16 ENCOUNTER — Encounter: Payer: Self-pay | Admitting: Family Medicine

## 2015-05-16 ENCOUNTER — Ambulatory Visit (INDEPENDENT_AMBULATORY_CARE_PROVIDER_SITE_OTHER): Admitting: Family Medicine

## 2015-05-16 VITALS — BP 120/80 | HR 68 | Temp 98.1°F | Resp 16 | Ht 66.0 in | Wt 200.0 lb

## 2015-05-16 DIAGNOSIS — Z124 Encounter for screening for malignant neoplasm of cervix: Secondary | ICD-10-CM

## 2015-05-16 DIAGNOSIS — G4700 Insomnia, unspecified: Secondary | ICD-10-CM

## 2015-05-16 DIAGNOSIS — Z23 Encounter for immunization: Secondary | ICD-10-CM | POA: Diagnosis not present

## 2015-05-16 MED ORDER — ZOLPIDEM TARTRATE 10 MG PO TABS: 10.0000 mg | ORAL_TABLET | Freq: Every evening | ORAL | Status: DC | PRN

## 2015-05-16 NOTE — Progress Notes (Signed)
Subjective:    Patient ID: Brooke Schneider, female    DOB: 08-Apr-1951, 64 y.o.   MRN: [id number]  HPI Patient is been dealing with chronic insomnia now for years. She responded well initially to Ambien that we discontinue that medication due to the risk of long-term dependency. We tried the patient on trazodone with no benefit. She is tried Benadryl and Unisom over-the-counter with no benefit. She is here today to discuss going back on Ambien. She is also requesting a referral to GYN for a Pap smear. She just feels more comfortable seeing a gynecologist. I will gladly arrange that referral Past Medical History  Diagnosis Date  . Goiter   . Asthma   . Allergic rhinitis   . Depression   . Goiter   . [id number](478.2)    Past Surgical History  Procedure Laterality Date  . Knee surgery    . Thyroidectomy    . Abdominal hysterectomy    . Cholecystectomy N/A 11/02/2013    Procedure: LAPAROSCOPIC CHOLECYSTECTOMY ;  Surgeon: Cherylynn Ridges, MD;  Location: [facility] OR;  Service: General;  Laterality: N/A;   Current Outpatient Prescriptions on File Prior to Visit  Medication Sig Dispense Refill  . buPROPion (WELLBUTRIN XL) 150 MG 24 hr tablet TAKE 1 TABLET DAILY 90 tablet 2  . Calcium Carbonate-Vitamin D (CALCIUM 600+D) 600-400 MG-UNIT per tablet Take 1 tablet by mouth 2 (two) times daily.     . clotrimazole-betamethasone (LOTRISONE) cream Apply 1 application topically 2 (two) times daily. 30 g 0  . diclofenac (VOLTAREN) 75 MG EC tablet Take 1 tablet (75 mg total) by mouth 2 (two) times daily. 30 tablet 0  . dicyclomine (BENTYL) 20 MG tablet Take 1 tablet (20 mg total) by mouth 4 (four) times daily -  before meals and at bedtime. 90 tablet 3  . estradiol (CLIMARA - DOSED IN MG/24 HR) 0.025 mg/24hr patch APPLY 1 PATCH ONTO THE SKIN ONCE A WEEK 12 patch 5  . FLOVENT HFA 110 MCG/ACT inhaler Inhale 1 puff into the lungs daily.     Marland Kitchen FLUOCINOLONE ACETONIDE SCALP 0.01 % OIL Massage into scalp, cover with  shower cap and rinse off after 4 hours (1 x per week) 1 Bottle 1  . fluticasone (FLONASE) 50 MCG/ACT nasal spray Place 2 sprays into the nose daily. 16 g 6  . levothyroxine (SYNTHROID, LEVOTHROID) 112 MCG tablet TAKE 1 TABLET DAILY 90 tablet 2  . montelukast (SINGULAIR) 10 MG tablet Take 1 tablet (10 mg total) by mouth at bedtime. 90 tablet 3  . omeprazole (PRILOSEC) 20 MG capsule TAKE 1 CAPSULE DAILY 90 capsule 2  . PROAIR HFA 108 (90 BASE) MCG/ACT inhaler INHALE 2 PUFFS INTO THE LUNGS EVERY 6 HOURS AS NEEDED FOR WHEEZING 7 each 3  . propranolol ER (INDERAL LA) 60 MG 24 hr capsule TAKE 1 CAPSULE DAILY 90 capsule 2  . rizatriptan (MAXALT) 10 MG tablet Take 1 tablet (10 mg total) by mouth as needed for migraine. May repeat in 2 hours if needed 30 tablet 3  . topiramate (TOPAMAX) 50 MG tablet  in the morning and  at bedtime 270 tablet 3   No current facility-administered medications on file prior to visit.   Allergies  Allergen Reactions  . Sulfamethoxazole-Trimethoprim Swelling  . Metronidazole Itching and Rash   Social History   Social History  . Marital Status: Married    Spouse Name: Brooke Schneider  . Number of Children: 2  . Years of Education: college  Occupational History  . housewife    Social History Main Topics  . Smoking status: Passive Smoke Exposure - Never Smoker  . Smokeless tobacco: Never Used     Comment: husband smoking cigars  . Alcohol Use: Yes     Comment: occ  . Drug Use: No  . Sexual Activity: Not Currently   Other Topics Concern  . Not on file   Social History Narrative   Patient lives at home with her husband Brooke Schneider(Alan).   Retired.   Education college B.S.   Right handed.   Caffeine one cup of coffee daily.      Review of Systems  All other systems reviewed and are negative.      Objective:   Physical Exam  Constitutional: She appears well-developed and well-nourished.  Cardiovascular: Normal rate, regular rhythm and normal heart sounds.     Pulmonary/Chest: Effort normal and breath sounds normal.  Psychiatric: She has a normal mood and affect. Her behavior is normal. Judgment and thought content normal.  Vitals reviewed.         Assessment & Plan:  Need for prophylactic vaccination and inoculation against influenza - Plan: Flu Vaccine QUAD 36+ mos IM  Insomnia  Pap smear for cervical cancer screening - Plan: Ambulatory referral to Gynecology  We had a long discussion today about her possible choices including trazodone versus Elavil versus Ambien versus belsomra.  I suggested Belsomra for long-term use given its safety profile in the elderly however due to cost, the patient feels more comfortable taking Ambien 5 mg by mouth daily at bedtime. I will give her 90 tablets and 1 refill. She will try to use as little of the medication as possible. I have no concerns about abuse or diversion in this patient. Hopefully she will not require the medication every night. I will also schedule the patient see a gynecologist per her request

## 2015-05-19 ENCOUNTER — Other Ambulatory Visit: Payer: Self-pay | Admitting: Family Medicine

## 2015-07-21 ENCOUNTER — Encounter: Payer: Self-pay | Admitting: Family Medicine

## 2015-07-21 ENCOUNTER — Ambulatory Visit (INDEPENDENT_AMBULATORY_CARE_PROVIDER_SITE_OTHER): Admitting: Family Medicine

## 2015-07-21 VITALS — BP 118/70 | HR 80 | Temp 98.2°F | Resp 20 | Wt 203.0 lb

## 2015-07-21 DIAGNOSIS — J0100 Acute maxillary sinusitis, unspecified: Secondary | ICD-10-CM

## 2015-07-21 DIAGNOSIS — H6981 Other specified disorders of Eustachian tube, right ear: Secondary | ICD-10-CM

## 2015-07-21 LAB — STREP GROUP A AG, W/REFLEX TO CULT: STREGTOCOCCUS GROUP A AG SCREEN: NOT DETECTED

## 2015-07-21 MED ORDER — FEXOFENADINE HCL 180 MG PO TABS: 180.0000 mg | ORAL_TABLET | Freq: Every day | ORAL | Status: DC

## 2015-07-21 MED ORDER — FLUTICASONE PROPIONATE 50 MCG/ACT NA SUSP: 2.0000 | Freq: Every day | NASAL | Status: DC

## 2015-07-21 MED ORDER — AMOXICILLIN-POT CLAVULANATE 875-125 MG PO TABS: 1.0000 | ORAL_TABLET | Freq: Two times a day (BID) | ORAL | Status: DC

## 2015-07-21 NOTE — Progress Notes (Signed)
Patient ID: Brooke Schneider, female   DOB: 08/01/1950, 65 y.o.   MRN: [id number]   Subjective:    Patient ID: Brooke Schneider, female    DOB: [birth year], 65 y.o.   MRN: [id number]  Patient presents for Sore Throat; headaches; and Generalized Body Aches  patient here with sore throat sinus pressure headaches body aches for the past 5-7 days. She's not had any significant fever. She has underlying asthma allergic rhinitis and she has history of migraines. She is difficulties with her sinuses on and off especially during the weather changes. She does not recall any sick contacts. She has been using her Flovent as prescribed has not used her albuterol. She is also using Singulair at night and tried Allegra which helped the itchy sore throat and some watery eyes. No GI symptoms     Review Of Systems:  GEN- denies fatigue, fever, weight loss,weakness, recent illness HEENT- denies eye drainage, change in vision, +nasal discharge, CVS- denies chest pain, palpitations RESP- denies SOB, cough, wheeze ABD- denies N/V, change in stools, abd pain GU- denies dysuria, hematuria, dribbling, incontinence MSK- denies joint pain, +muscle aches, injury Neuro- + headache, dizziness, syncope, seizure activity       Objective:    BP 118/70 mmHg  Pulse 80  Temp(Src) 98.2 F (36.8 C) (Oral)  Resp 20  Wt 203 lb (92.08 kg) GEN- NAD, alert and oriented x3 HEENT- PERRL, EOMI, non injected sclera, pink conjunctiva, MMM, oropharynx mild injection, TM clear bilat no effusion,  + maxillary sinus tenderness, inflammed turbinates,  Nasal drainage  Neck- Supple, shotty  LAD CVS- RRR, no murmur RESP-CTAB EXT- No edema Pulses- Radial 2+  Strep- NEG       Assessment & Plan:      Problem List Items Addressed This Visit    None    Visit Diagnoses    Acute maxillary sinusitis, recurrence not specified    -  Primary    Started as viral illness, with underlying allergies, history of sinusitis, asthma, will  treat with augmentin,flonase, allergra, continue asthma meds, tylenol     Relevant Medications    amoxicillin-clavulanate (AUGMENTIN) 875-125 MG tablet    fluticasone (FLONASE) 50 MCG/ACT nasal spray    fexofenadine (ALLEGRA) 180 MG tablet    Other Relevant Orders    STREP GROUP A AG, W/REFLEX TO CULT    Eustachian tube dysfunction, right        Relevant Medications    fluticasone (FLONASE) 50 MCG/ACT nasal spray       Note: This dictation was prepared with Dragon dictation along with smaller phrase technology. Any transcriptional errors that result from this process are unintentional.

## 2015-07-21 NOTE — Patient Instructions (Signed)
Take the allegra in the morning Take antibiotics- Augmentin  Plenty of fluids COntinue tylenol FLonase sent in F/U as needed

## 2015-07-23 LAB — CULTURE, GROUP A STREP: ORGANISM ID, BACTERIA: NORMAL

## 2015-08-02 ENCOUNTER — Emergency Department (HOSPITAL_COMMUNITY)

## 2015-08-02 ENCOUNTER — Emergency Department (HOSPITAL_COMMUNITY)
Admission: EM | Admit: 2015-08-02 | Discharge: 2015-08-02 | Disposition: A | Discharge status: Home / Self Care | Attending: Emergency Medicine | Admitting: Emergency Medicine

## 2015-08-02 ENCOUNTER — Encounter (HOSPITAL_COMMUNITY): Payer: Self-pay | Admitting: Emergency Medicine

## 2015-08-02 DIAGNOSIS — Z7951 Long term (current) use of inhaled steroids: Secondary | ICD-10-CM | POA: Diagnosis not present

## 2015-08-02 DIAGNOSIS — Z79899 Other long term (current) drug therapy: Secondary | ICD-10-CM | POA: Insufficient documentation

## 2015-08-02 DIAGNOSIS — J309 Allergic rhinitis, unspecified: Secondary | ICD-10-CM

## 2015-08-02 DIAGNOSIS — J45901 Unspecified asthma with (acute) exacerbation: Secondary | ICD-10-CM

## 2015-08-02 DIAGNOSIS — F329 Major depressive disorder, single episode, unspecified: Secondary | ICD-10-CM | POA: Insufficient documentation

## 2015-08-02 DIAGNOSIS — R0602 Shortness of breath: Secondary | ICD-10-CM | POA: Diagnosis present

## 2015-08-02 DIAGNOSIS — E049 Nontoxic goiter, unspecified: Secondary | ICD-10-CM | POA: Diagnosis not present

## 2015-08-02 LAB — CBC WITH DIFFERENTIAL/PLATELET
Basophils Absolute: 0 10*3/uL (ref 0.0–0.1)
Basophils Relative: 0 %
EOS ABS: 0.2 10*3/uL (ref 0.0–0.7)
EOS PCT: 4 %
HCT: 40.5 % (ref 36.0–46.0)
Hemoglobin: 13.3 g/dL (ref 12.0–15.0)
LYMPHS ABS: 3 10*3/uL (ref 0.7–4.0)
LYMPHS PCT: 59 %
MCH: 30.6 pg (ref 26.0–34.0)
MCHC: 32.8 g/dL (ref 30.0–36.0)
MCV: 93.3 fL (ref 78.0–100.0)
MONO ABS: 0.3 10*3/uL (ref 0.1–1.0)
Monocytes Relative: 5 %
Neutro Abs: 1.7 10*3/uL (ref 1.7–7.7)
Neutrophils Relative %: 32 %
PLATELETS: 206 10*3/uL (ref 150–400)
RBC: 4.34 MIL/uL (ref 3.87–5.11)
RDW: 13.5 % (ref 11.5–15.5)
WBC: 5.2 10*3/uL (ref 4.0–10.5)

## 2015-08-02 LAB — BRAIN NATRIURETIC PEPTIDE: B NATRIURETIC PEPTIDE 5: 41.7 pg/mL (ref 0.0–100.0)

## 2015-08-02 LAB — COMPREHENSIVE METABOLIC PANEL
ALT: 16 U/L (ref 14–54)
ANION GAP: 10 (ref 5–15)
AST: 15 U/L (ref 15–41)
Albumin: 3.8 g/dL (ref 3.5–5.0)
Alkaline Phosphatase: 42 U/L (ref 38–126)
BUN: 9 mg/dL (ref 6–20)
CHLORIDE: 107 mmol/L (ref 101–111)
CO2: 22 mmol/L (ref 22–32)
CREATININE: 0.92 mg/dL (ref 0.44–1.00)
Calcium: 9.2 mg/dL (ref 8.9–10.3)
Glucose, Bld: 82 mg/dL (ref 65–99)
POTASSIUM: 3.7 mmol/L (ref 3.5–5.1)
SODIUM: 139 mmol/L (ref 135–145)
Total Bilirubin: 0.5 mg/dL (ref 0.3–1.2)
Total Protein: 7.1 g/dL (ref 6.5–8.1)

## 2015-08-02 LAB — I-STAT TROPONIN, ED: TROPONIN I, POC: 0 ng/mL (ref 0.00–0.08)

## 2015-08-02 MED ORDER — PREDNISONE 10 MG PO TABS: 20.0000 mg | ORAL_TABLET | Freq: Every day | ORAL | Status: DC

## 2015-08-02 MED ORDER — PREDNISONE 20 MG PO TABS
60.0000 mg | ORAL_TABLET | Freq: Once | ORAL | Status: AC
  Administered 2015-08-02: 60 mg via ORAL
  Filled 2015-08-02: qty 3

## 2015-08-02 MED ORDER — PREDNISONE 10 MG PO TABS: 40.0000 mg | ORAL_TABLET | Freq: Every day | ORAL | Status: AC

## 2015-08-02 MED ORDER — IPRATROPIUM BROMIDE 0.03 % NA SOLN: 2.0000 | Freq: Two times a day (BID) | NASAL | Status: DC

## 2015-08-02 NOTE — Discharge Instructions (Signed)

## 2015-08-02 NOTE — ED Notes (Signed)
Pt c/o shortness of breath ongoing since the 13th. Pt seen at [facility] and was diagnosed with sinusitis.

## 2015-08-02 NOTE — ED Provider Notes (Addendum)
CSN: [id number]     Arrival date & time 08/02/15  1059 History   First MD Initiated Contact with Patient 08/02/15 1247     Chief Complaint  Patient presents with  . Shortness of Breath     (Consider location/radiation/quality/duration/timing/severity/associated sxs/prior Treatment) HPI Comments: Diagnosed with sinusitis by family practitioner, prescribed flonase and allegra, augmentin, finished this Singulair at night for asthma Allegra in AM Seemed to help Allegra helped with immediate runny nose, itchy eyes, for day symptoms Itchiness Flowers blossoming, concerned this was etiology Chest started feeling more congested again, like it did before the 13th.  Was sneezing, and recently started coughing, nonproductive Don't feel wheezing Mild dyspnea If take rescue inhaler it helps (better laying down flat) No fevers, no body aches or chills  Patient is a 65 y.o. female presenting with shortness of breath.  Shortness of Breath Associated symptoms: cough   Associated symptoms: no abdominal pain, no chest pain, no fever, no headaches, no neck pain, no rash, no sore throat and no vomiting     Past Medical History  Diagnosis Date  . Goiter   . Asthma   . Allergic rhinitis   . Depression   . Goiter   . NWGNFAOZ(308.6)    Past Surgical History  Procedure Laterality Date  . Knee surgery    . Thyroidectomy    . Abdominal hysterectomy    . Cholecystectomy N/A 11/02/2013    Procedure: LAPAROSCOPIC CHOLECYSTECTOMY ;  Surgeon: Cherylynn Ridges, MD;  Location: [facility] OR;  Service: General;  Laterality: N/A;   Family History  Problem Relation Age of Onset  . Allergies Mother   . ALS Father   . Prostate cancer Father    Social History  Substance Use Topics  . Smoking status: Passive Smoke Exposure - Never Smoker  . Smokeless tobacco: Never Used     Comment: husband smoking cigars  . Alcohol Use: Yes     Comment: occ   OB History    Gravida Para Term Preterm AB TAB SAB Ectopic  Multiple Living   4 4 4       4      Review of Systems  Constitutional: Positive for chills and fatigue. Negative for fever.  HENT: Positive for rhinorrhea and sneezing. Negative for sore throat.   Eyes: Negative for visual disturbance.  Respiratory: Positive for cough and shortness of breath (not exertional or positional).   Cardiovascular: Negative for chest pain and leg swelling.  Gastrointestinal: Negative for nausea, vomiting and abdominal pain.  Genitourinary: Negative for difficulty urinating.  Musculoskeletal: Negative for back pain and neck pain.  Skin: Negative for rash.  Neurological: Negative for syncope and headaches.      Allergies  Sulfamethoxazole-trimethoprim and Metronidazole  Home Medications   Prior to Admission medications   Medication Sig Start Date End Date Taking? Authorizing Provider  buPROPion (WELLBUTRIN XL) 150 MG 24 hr tablet TAKE 1 TABLET DAILY 05/19/15  Yes Donita Brooks, MD  Calcium Carbonate-Vitamin D (CALCIUM 600+D) 600-400 MG-UNIT per tablet Take 1 tablet by mouth 2 (two) times daily.    Yes Historical Provider, MD  dicyclomine (BENTYL) 20 MG tablet Take 1 tablet (20 mg total) by mouth 4 (four) times daily -  before meals and at bedtime. Patient taking differently: Take 20 mg by mouth 3 (three) times daily as needed for spasms.  02/06/15  Yes Donita Brooks, MD  DIVIGEL 0.25 MG/0.25GM GEL Apply 1 application topically daily. Thigh area 07/01/15  Yes Historical  Provider, MD  fexofenadine (ALLEGRA) 180 MG tablet Take 1 tablet (180 mg total) by mouth daily. 07/21/15  Yes Salley Scarlet, MD  FLOVENT HFA 110 MCG/ACT inhaler Inhale 1 puff into the lungs daily.  12/15/14  Yes Historical Provider, MD  fluticasone (FLONASE) 50 MCG/ACT nasal spray Place 2 sprays into both nostrils daily. 07/21/15  Yes Salley Scarlet, MD  levothyroxine (SYNTHROID, LEVOTHROID) 112 MCG tablet TAKE 1 TABLET DAILY 05/19/15  Yes Donita Brooks, MD  montelukast (SINGULAIR) 10  MG tablet Take 1 tablet (10 mg total) by mouth at bedtime. 10/03/14  Yes Donita Brooks, MD  Multiple Vitamins-Minerals (MULTIVITAMIN WITH MINERALS) tablet Take 1 tablet by mouth daily.   Yes Historical Provider, MD  omeprazole (PRILOSEC) 20 MG capsule TAKE 1 CAPSULE DAILY 05/19/15  Yes Donita Brooks, MD  PROAIR HFA 108 [phone] BASE) MCG/ACT inhaler INHALE 2 PUFFS INTO THE LUNGS EVERY 6 HOURS AS NEEDED FOR WHEEZING 01/03/14  Yes Donita Brooks, MD  propranolol ER (INDERAL LA) 60 MG 24 hr capsule TAKE 1 CAPSULE DAILY 05/19/15  Yes Donita Brooks, MD  rizatriptan (MAXALT) 10 MG tablet Take 10 mg by mouth as needed for migraine. May repeat in 2 hours if needed   Yes Historical Provider, MD  topiramate (TOPAMAX) 50 MG tablet  in the morning and  at bedtime 02/03/15  Yes Nilda Riggs, NP  ipratropium (ATROVENT) 0.03 % nasal spray Place 2 sprays into both nostrils every 12 (twelve) hours. 08/02/15   Alvira Monday, MD  predniSONE (DELTASONE) 10 MG tablet Take 4 tablets (40 mg total) by mouth daily. 08/02/15 08/06/15  Alvira Monday, MD   BP 120/77 mmHg  Pulse 59  Temp(Src) 97.8 F (36.6 C) (Oral)  Resp 20  Ht  (1.651 m)  Wt 203 lb (92.08 kg)  BMI 33.78 kg/m2  SpO2 96% Physical Exam  Constitutional: She is oriented to person, place, and time. She appears well-developed and well-nourished. No distress.  HENT:  Head: Normocephalic and atraumatic.  Eyes: Conjunctivae and EOM are normal.  Neck: Normal range of motion.  Cardiovascular: Normal rate, regular rhythm, normal heart sounds and intact distal pulses.  Exam reveals no gallop and no friction rub.   No murmur heard. Pulmonary/Chest: Effort normal. No respiratory distress. She has wheezes (occasional). She has no rales.  Abdominal: Soft. She exhibits no distension. There is no tenderness. There is no guarding.  Musculoskeletal: She exhibits no edema or tenderness.  Neurological: She is alert and oriented to person, place,  and time.  Skin: Skin is warm and dry. No rash noted. She is not diaphoretic. No erythema.  Nursing note and vitals reviewed.   ED Course  Procedures (including critical care time) Labs Review Labs Reviewed  CBC WITH DIFFERENTIAL/PLATELET  COMPREHENSIVE METABOLIC PANEL  BRAIN NATRIURETIC PEPTIDE  I-STAT TROPOININ, ED    Imaging Review Dg Chest 2 View  08/02/2015  CLINICAL DATA:  65 year old presenting with 1 day history of cough, shortness of breath and sore throat. Current history of asthma. EXAM: CHEST  2 VIEW COMPARISON:  04/21/2011 and earlier. FINDINGS: Cardiac silhouette normal in size, unchanged. Thoracic aorta mildly tortuous and atherosclerotic, unchanged. Hilar and mediastinal contours otherwise unremarkable. Prominent bronchovascular markings diffusely and moderate central peribronchial thickening, unchanged. Lungs otherwise clear. No localized airspace consolidation. No pleural effusions. No pneumothorax. Normal pulmonary vascularity. Degenerative changes and DISH involving the thoracic spine. No significant interval change. IMPRESSION: Stable mild changes of chronic bronchitis and/or asthma. No  acute cardiopulmonary disease. Electronically Signed   By: Hulan Saas M.D.   On: 08/02/2015 12:32   I have personally reviewed and evaluated these images and lab results as part of my medical decision-making.   EKG Interpretation   Date/Time:  Saturday August 02 2015 11:21:58 EST Ventricular Rate:  62 PR Interval:  166 QRS Duration: 72 QT Interval:  400 QTC Calculation: 406 R Axis:   -41 Text Interpretation:  Normal sinus rhythm Left axis deviation Low voltage  QRS Possible Lateral infarct , age undetermined Abnormal ECG No  significant change since last tracing Confirmed by [facility] MD, Joan Avetisyan  ([id number]) on 08/02/2015 12:49:26 PM      MDM   Final diagnoses:  Allergic rhinitis, unspecified allergic rhinitis type  Asthma, unspecified asthma severity, with acute  exacerbation   65yo female with history of allergies and asthma presents with concern for shortness of breath, itchy throat, sneezing, coughing, mildly increased dyspnea.  Differential diagnosis for dyspnea includes ACS, PE, asthma exacerbation, CHF exacerbation, anemia, pneumonia.  Chest x-ray was done which showed no evidence of pneumonia or pulmonary edema. EKG was evaluated by me which showed no acute changes from prior.  BNP was WNL.  No tachypnea, no tachycardia, no hypoxia,  and hx less consistent with PE.  No significant facial pain or tenderness, doubt acute sinusitis.  Pt with occasional wheeze on exam, history consistent with URI/allergic rhinitis with mild asthma exacerbation. Will give short rx for steroids, and rx for atrovent nasal spray.  Patient discharged in stable condition with understanding of reasons to return.       Alvira Monday, MD 08/02/15 6045  Alvira Monday, MD 08/02/15 2049

## 2015-08-11 ENCOUNTER — Ambulatory Visit: Admitting: Family Medicine

## 2015-08-12 ENCOUNTER — Ambulatory Visit (INDEPENDENT_AMBULATORY_CARE_PROVIDER_SITE_OTHER): Admitting: Family Medicine

## 2015-08-12 ENCOUNTER — Encounter: Payer: Self-pay | Admitting: Family Medicine

## 2015-08-12 VITALS — BP 128/64 | HR 68 | Temp 97.9°F | Resp 14 | Ht 66.0 in | Wt 204.0 lb

## 2015-08-12 DIAGNOSIS — J302 Other seasonal allergic rhinitis: Secondary | ICD-10-CM | POA: Diagnosis not present

## 2015-08-12 DIAGNOSIS — J4520 Mild intermittent asthma, uncomplicated: Secondary | ICD-10-CM

## 2015-08-12 MED ORDER — RIZATRIPTAN BENZOATE 10 MG PO TABS: 10.0000 mg | ORAL_TABLET | ORAL | Status: DC | PRN

## 2015-08-12 MED ORDER — BUDESONIDE-FORMOTEROL FUMARATE 80-4.5 MCG/ACT IN AERO: 1.0000 | INHALATION_SPRAY | Freq: Two times a day (BID) | RESPIRATORY_TRACT | Status: DC

## 2015-08-12 MED ORDER — IPRATROPIUM BROMIDE 0.03 % NA SOLN: 2.0000 | Freq: Two times a day (BID) | NASAL | Status: DC

## 2015-08-12 MED ORDER — CETIRIZINE HCL 10 MG PO TABS: 10.0000 mg | ORAL_TABLET | Freq: Every day | ORAL | Status: DC

## 2015-08-12 NOTE — Progress Notes (Signed)
Patient ID: Brooke Schneider, female   DOB: 01/06/1951, 65 y.o.   MRN: [id number]   Subjective:    Patient ID: Brooke SisNorma A Schneider, female    DOB: 03/13/1951, 65 y.o.   MRN: [id number]  Patient presents for F/U  patient here for follow-up. I saw her for sinusitis about a week and a half later she began having tightness in her chest she went to the emergency room she had cardiac workup chest x-ray which were all benign she was diagnosed with possible asthma exacerbation. She tried using her Flovent but doesn't feel like this was helping was causing more side effects. She was also given a new allergy spray Atrovent which helped better than her prednisone. She was given prednisone for 5 days which also helped clear her up. She still using the albuterol and Atrovent/Flonase in her other allergy medications. She does not one he used the Flovent as this caused strange side effects. She states that her breathing is back to her baseline and she started to get her energy levels back.    Review Of Systems:  GEN- denies fatigue, fever, weight loss,weakness, recent illness HEENT- denies eye drainage, change in vision, nasal discharge, CVS- denies chest pain, palpitations RESP- denies SOB, cough, wheeze ABD- denies N/V, change in stools, abd pain GU- denies dysuria, hematuria, dribbling, incontinence MSK- denies joint pain, muscle aches, injury Neuro- denies headache, dizziness, syncope, seizure activity       Objective:    BP 128/64 mmHg  Pulse 68  Temp(Src) 97.9 F (36.6 C) (Oral)  Resp 14  Ht 5\' 6"  (1.676 m)  Wt 204 lb (92.534 kg)  BMI 32.94 kg/m2 GEN- NAD, alert and oriented x3 HEENT- PERRL, EOMI, non injected sclera, pink conjunctiva, MMM, oropharynx clear Neck- Supple, no thyromegaly CVS- RRR, no murmur RESP-CTAB ABD-NABS,soft,NT,ND EXT- No edema Pulses- Radial, DP- 2+        Assessment & Plan:      Problem List Items Addressed This Visit    Asthma, allergic - Primary    Seems   To be back at her baseline. I've given her Symbicort to try for her asthma since she had issues with the Flovent. She will also complete the Astelin this week and then return to her regular Flonase and other antihistamines.      Relevant Medications   budesonide-formoterol (SYMBICORT) 80-4.5 MCG/ACT inhaler   Allergic rhinitis     I spent a possibly 15 minutes with patient greater than 50% on review of all the details from her ER visit         Note: This dictation was prepared with Dragon dictation along with smaller phrase technology. Any transcriptional errors that result from this process are unintentional.

## 2015-08-12 NOTE — Patient Instructions (Addendum)
Use flonase for regular allergy season Continue Atrovent for another week  Try the symbicort twice a day  F/U as needed

## 2015-08-13 NOTE — Assessment & Plan Note (Signed)
I spent a possibly 15 minutes with patient greater than 50% on review of all the details from her ER visit

## 2015-08-13 NOTE — Assessment & Plan Note (Signed)
Seems  To be back at her baseline. I've given her Symbicort to try for her asthma since she had issues with the Flovent. She will also complete the Astelin this week and then return to her regular Flonase and other antihistamines.

## 2015-08-29 ENCOUNTER — Telehealth: Payer: Self-pay | Admitting: Family Medicine

## 2015-08-29 DIAGNOSIS — J309 Allergic rhinitis, unspecified: Secondary | ICD-10-CM

## 2015-08-29 DIAGNOSIS — J4521 Mild intermittent asthma with (acute) exacerbation: Secondary | ICD-10-CM

## 2015-08-29 DIAGNOSIS — H6981 Other specified disorders of Eustachian tube, right ear: Secondary | ICD-10-CM

## 2015-08-29 NOTE — Telephone Encounter (Signed)
[facility]

## 2015-08-29 NOTE — Telephone Encounter (Signed)
Pt would like a 90 day refill of Ambien 10 mg, Cetirizine hcl 10 mg, Montelukast Sod. Tabs 10 mg, Symbicort 80/4.5 and Fluticasone Propionate nasal spray [facility] Pt's number [phone]

## 2015-09-01 MED ORDER — MONTELUKAST SODIUM 10 MG PO TABS: 10.0000 mg | ORAL_TABLET | Freq: Every day | ORAL | Status: DC

## 2015-09-01 MED ORDER — ZOLPIDEM TARTRATE 10 MG PO TABS: 10.0000 mg | ORAL_TABLET | Freq: Every evening | ORAL | Status: DC | PRN

## 2015-09-01 MED ORDER — CETIRIZINE HCL 10 MG PO TABS: 10.0000 mg | ORAL_TABLET | Freq: Every day | ORAL | Status: DC

## 2015-09-01 MED ORDER — BUDESONIDE-FORMOTEROL FUMARATE 80-4.5 MCG/ACT IN AERO
1.0000 | INHALATION_SPRAY | Freq: Two times a day (BID) | RESPIRATORY_TRACT | Status: DC
Start: 2015-09-01 — End: 2015-10-17

## 2015-09-01 MED ORDER — FLUTICASONE PROPIONATE 50 MCG/ACT NA SUSP: 2.0000 | Freq: Every day | NASAL | Status: DC

## 2015-09-01 NOTE — Telephone Encounter (Signed)
Medication called/sent to requested pharmacy  

## 2015-09-01 NOTE — Telephone Encounter (Signed)
?   OK to Refill Ambien for 90 days?

## 2015-09-01 NOTE — Telephone Encounter (Signed)
ok 

## 2015-10-17 ENCOUNTER — Encounter: Payer: Self-pay | Admitting: Family Medicine

## 2015-10-17 ENCOUNTER — Ambulatory Visit (INDEPENDENT_AMBULATORY_CARE_PROVIDER_SITE_OTHER): Payer: Medicare Other | Admitting: Family Medicine

## 2015-10-17 VITALS — BP 136/80 | HR 82 | Temp 98.0°F | Resp 14 | Ht 66.0 in | Wt 206.0 lb

## 2015-10-17 DIAGNOSIS — J4520 Mild intermittent asthma, uncomplicated: Secondary | ICD-10-CM | POA: Diagnosis not present

## 2015-10-17 DIAGNOSIS — K589 Irritable bowel syndrome without diarrhea: Secondary | ICD-10-CM

## 2015-10-17 DIAGNOSIS — G4700 Insomnia, unspecified: Secondary | ICD-10-CM | POA: Diagnosis not present

## 2015-10-17 DIAGNOSIS — E039 Hypothyroidism, unspecified: Secondary | ICD-10-CM | POA: Insufficient documentation

## 2015-10-17 DIAGNOSIS — E890 Postprocedural hypothyroidism: Secondary | ICD-10-CM

## 2015-10-17 MED ORDER — DICYCLOMINE HCL 20 MG PO TABS: 20.0000 mg | ORAL_TABLET | Freq: Three times a day (TID) | ORAL | Status: DC

## 2015-10-17 MED ORDER — BUDESONIDE-FORMOTEROL FUMARATE 80-4.5 MCG/ACT IN AERO: 1.0000 | INHALATION_SPRAY | Freq: Two times a day (BID) | RESPIRATORY_TRACT | Status: DC

## 2015-10-17 MED ORDER — RIZATRIPTAN BENZOATE 10 MG PO TABS: 10.0000 mg | ORAL_TABLET | ORAL | Status: DC | PRN

## 2015-10-17 MED ORDER — ZOLPIDEM TARTRATE 10 MG PO TABS: 10.0000 mg | ORAL_TABLET | Freq: Every evening | ORAL | Status: DC | PRN

## 2015-10-17 NOTE — Assessment & Plan Note (Signed)
Restart Ambien

## 2015-10-17 NOTE — Assessment & Plan Note (Signed)
Recheck thyroid function studies. 

## 2015-10-17 NOTE — Assessment & Plan Note (Signed)
Continue Symbicort to help maintain her symptoms. She will also continue her other allergy medications

## 2015-10-17 NOTE — Progress Notes (Signed)
Patient ID: Brooke Schneider, female   DOB: 07/30/1950, 65 y.o.   MRN: [id number]    Subjective:    Patient ID: Brooke SisNorma A Picking, female    DOB: 01/17/1951, 65 y.o.   MRN: [id number]  Patient presents for F/U Patient here to follow-up asthma. I last saw her back in March after she had a bout of sinusitis and then started with asthma exacerbation. She was given samples of Symbicort to try for her asthma is as she has had issues with plain Flovent and other inhaled steroids. She is also using Flonase and antihistamines that she also has allergic rhinitis history that contributes to her asthma. Doing well with the Symbicort and noticed an improvement in her breathing. She is not requiring her rescue inhaler. Her other medications were reviewed in detail. She like a refill on her Ambien distance help with her chronic insomnia since she's been off the past month she's had significant difficulty sleeping.   Review Of Systems:  GEN- denies fatigue, fever, weight loss,weakness, recent illness HEENT- denies eye drainage, change in vision, nasal discharge, CVS- denies chest pain, palpitations RESP- denies SOB, cough, wheeze ABD- denies N/V, change in stools, abd pain GU- denies dysuria, hematuria, dribbling, incontinence MSK- denies joint pain, muscle aches, injury Neuro- denies headache, dizziness, syncope, seizure activity       Objective:    BP 136/80 mmHg  Pulse 82  Temp(Src) 98 F (36.7 C) (Oral)  Resp 14  Ht 5\' 6"  (1.676 m)  Wt 206 lb (93.441 kg)  BMI 33.27 kg/m2  SpO2 98% GEN- NAD, alert and oriented x3 HEENT- PERRL, EOMI, non injected sclera, pink conjunctiva, MMM, oropharynx clear Neck- Supple, no thyromegaly CVS- RRR, no murmur RESP-CTAB EXT- No edema Pulses- Radial, DP- 2+        Assessment & Plan:      Problem List Items Addressed This Visit    INSOMNIA    Restart Ambien      Hypothyroidism - Primary    Recheck thyroid function studies      Relevant Orders   TSH   T3, free   T4, free   CBC with Differential/Platelet   Comprehensive metabolic panel   Asthma, allergic    Continue Symbicort to help maintain her symptoms. She will also continue her other allergy medications      Relevant Medications   budesonide-formoterol (SYMBICORT) 80-4.5 MCG/ACT inhaler    Other Visit Diagnoses    IBS (irritable bowel syndrome)        Relevant Medications    dicyclomine (BENTYL) 20 MG tablet    Other Relevant Orders    CBC with Differential/Platelet    Comprehensive metabolic panel       Note: This dictation was prepared with Dragon dictation along with smaller phrase technology. Any transcriptional errors that result from this process are unintentional.

## 2015-10-17 NOTE — Patient Instructions (Signed)
Continue current meds F/U 4 months 

## 2015-10-18 LAB — COMPREHENSIVE METABOLIC PANEL WITH GFR
ALT: 15 U/L (ref 6–29)
AST: 15 U/L (ref 10–35)
Albumin: 4.1 g/dL (ref 3.6–5.1)
Alkaline Phosphatase: 46 U/L (ref 33–130)
BUN: 11 mg/dL (ref 7–25)
CO2: 22 mmol/L (ref 20–31)
Calcium: 9 mg/dL (ref 8.6–10.4)
Chloride: 109 mmol/L (ref 98–110)
Creat: 0.9 mg/dL (ref 0.50–0.99)
Glucose, Bld: 82 mg/dL (ref 70–99)
Potassium: 3.8 mmol/L (ref 3.5–5.3)
Sodium: 142 mmol/L (ref 135–146)
Total Bilirubin: 0.3 mg/dL (ref 0.2–1.2)
Total Protein: 7.2 g/dL (ref 6.1–8.1)

## 2015-10-18 LAB — CBC WITH DIFFERENTIAL/PLATELET
Basophils Absolute: 52 {cells}/uL (ref 0–200)
Basophils Relative: 1 %
Eosinophils Absolute: 208 {cells}/uL (ref 15–500)
Eosinophils Relative: 4 %
HCT: 41.9 % (ref 35.0–45.0)
Hemoglobin: 14.2 g/dL (ref 12.0–15.0)
Lymphocytes Relative: 61 %
Lymphs Abs: 3172 {cells}/uL (ref 850–3900)
MCH: 31.4 pg (ref 27.0–33.0)
MCHC: 33.9 g/dL (ref 32.0–36.0)
MCV: 92.7 fL (ref 80.0–100.0)
MPV: 11 fL (ref 7.5–12.5)
Monocytes Absolute: 312 {cells}/uL (ref 200–950)
Monocytes Relative: 6 %
Neutro Abs: 1456 {cells}/uL — ABNORMAL LOW (ref 1500–7800)
Neutrophils Relative %: 28 %
Platelets: 239 K/uL (ref 140–400)
RBC: 4.52 MIL/uL (ref 3.80–5.10)
RDW: 14.5 % (ref 11.0–15.0)
WBC: 5.2 K/uL (ref 3.8–10.8)

## 2015-10-18 LAB — T3, FREE: T3, Free: 2.7 pg/mL (ref 2.3–4.2)

## 2015-10-18 LAB — TSH: TSH: 4.26 m[IU]/L

## 2015-10-18 LAB — T4, FREE: FREE T4: 1.2 ng/dL (ref 0.8–1.8)

## 2015-10-20 ENCOUNTER — Other Ambulatory Visit: Payer: Self-pay | Admitting: *Deleted

## 2015-10-20 DIAGNOSIS — K589 Irritable bowel syndrome without diarrhea: Secondary | ICD-10-CM

## 2015-10-20 MED ORDER — DICYCLOMINE HCL 20 MG PO TABS: 20.0000 mg | ORAL_TABLET | Freq: Three times a day (TID) | ORAL | Status: DC

## 2015-10-20 NOTE — Telephone Encounter (Signed)
Received fax requesting refill on Bentyl.   Refill appropriate and filled per protocol.

## 2015-10-21 ENCOUNTER — Encounter: Payer: Self-pay | Admitting: *Deleted

## 2015-10-21 NOTE — Telephone Encounter (Signed)
This encounter was created in error - please disregard.

## 2016-01-02 ENCOUNTER — Ambulatory Visit
Admission: RE | Admit: 2016-01-02 | Discharge: 2016-01-02 | Disposition: A | Discharge status: Home / Self Care | Payer: Medicare Other | Source: Ambulatory Visit | Attending: Family Medicine | Admitting: Family Medicine

## 2016-01-02 ENCOUNTER — Ambulatory Visit (INDEPENDENT_AMBULATORY_CARE_PROVIDER_SITE_OTHER): Payer: Medicare Other | Admitting: Family Medicine

## 2016-01-02 ENCOUNTER — Encounter: Payer: Self-pay | Admitting: Family Medicine

## 2016-01-02 ENCOUNTER — Other Ambulatory Visit: Payer: Self-pay | Admitting: *Deleted

## 2016-01-02 VITALS — BP 142/72 | HR 82 | Temp 98.9°F | Resp 16 | Ht 66.0 in | Wt 201.0 lb

## 2016-01-02 DIAGNOSIS — K589 Irritable bowel syndrome without diarrhea: Secondary | ICD-10-CM

## 2016-01-02 DIAGNOSIS — R1084 Generalized abdominal pain: Secondary | ICD-10-CM

## 2016-01-02 MED ORDER — ELUXADOLINE 75 MG PO TABS: 1.0000 | ORAL_TABLET | Freq: Two times a day (BID) | ORAL | 0 refills | Status: DC

## 2016-01-02 NOTE — Patient Instructions (Signed)
Release of records- Dr. Charna Elizabeth for Colonoscopy  Get xray done at [address] [place], [place] [facility] [address] F/U pending results

## 2016-01-02 NOTE — Assessment & Plan Note (Signed)
Concern for IBS with constipation and diarrhea. Other possibilities that she is just significantly constipated and leaking stool but this can also cause nausea. No red flags on exam today. I'll have her get an abdominal x-ray if she is full of stool and I will put her on a MiraLAX regimen she can also continue with her Metamucil capsules on a regular basis which does help with her bowel movements. It is more irritable bowel syndrome diarrhea we can trY Viberzi We'll also get records from her previous gastroenterologist

## 2016-01-02 NOTE — Progress Notes (Signed)
Patient ID: Brooke Schneider, female   DOB: 01/01/1951, 65 y.o.   MRN: [id number]    Subjective:    Patient ID: Brooke Schneider, female    DOB: December 07, 1950, 65 y.o.   MRN: [id number]  Patient presents for GI Issues (reports that she continues to have diarrhea and stomach cramping intermittently)  Pt here with intermittant diarrhea, cramping, history of IBS. Denies blood in stool. Had colonoscopy in 2011By Dr. Loreta Ave No significant weight loss She currently has Bentyl to use as needed  Her episodes initially started last summer she states that she was trying to go to the bathroom was having very large bowel movement she broke out in a sweat had nausea and dry heaving she then had an episode a couple months later more intermittent crampy pain but more loose stools. In between she does get constipation as well. About 2 weeks ago she had another flare where she had became very nauseous she did vomit a few times to Pepto-Bismol she then started having multiple watery bowel movements which she took the Bentyl 4. This did not help today and she took some Imodium and then her diarrhea slowed down to only once a day for a few days. She denies any blood in the stool or the emesis still has some mild cramping. She does not have a particular foods that cause the symptoms.  Review Of Systems:  GEN- denies fatigue, fever, weight loss,weakness, recent illness HEENT- denies eye drainage, change in vision, nasal discharge, CVS- denies chest pain, palpitations RESP- denies SOB, cough, wheeze ABD- + N/V, +change in stools, +abd pain GU- denies dysuria, hematuria, dribbling, incontinence MSK- denies joint pain, muscle aches, injury Neuro- denies headache, dizziness, syncope, seizure activity       Objective:    BP (!) 142/72 (BP Location: Left Arm, Patient Position: Sitting, Cuff Size: Normal)   Pulse 82   Temp 98.9 F (37.2 C) (Oral)   Resp 16   Ht 5\' 6"  (1.676 m)   Wt 201 lb (91.2 kg)   BMI 32.44 kg/m   GEN- NAD, alert and oriented x3 HEENT- PERRL, EOMI, non injected sclera, pink conjunctiva, MMM, oropharynx clear CVS- RRR, no murmur RESP-CTAB ABD-NABS,soft,mild TTP lower quadrants no rebound, no guarding ,ND EXT- No edema Pulses- Radial 2+        Assessment & Plan:      Problem List Items Addressed This Visit    IBS (irritable bowel syndrome)    Concern for IBS with constipation and diarrhea. Other possibilities that she is just significantly constipated and leaking stool but this can also cause nausea. No red flags on exam today. I'll have her get an abdominal x-ray if she is full of stool and I will put her on a MiraLAX regimen she can also continue with her Metamucil capsules on a regular basis which does help with her bowel movements. It is more irritable bowel syndrome diarrhea we can trY Viberzi We'll also get records from her previous gastroenterologist      Relevant Medications   Probiotic Product (ALIGN) 4 MG CAPS   Other Relevant Orders   DG Abd 2 Views    Other Visit Diagnoses    Generalized abdominal pain    -  Primary   Relevant Orders   DG Abd 2 Views      Note: This dictation was prepared with Dragon dictation along with smaller phrase technology. Any transcriptional errors that result from this process are unintentional.

## 2016-01-08 ENCOUNTER — Telehealth: Payer: Self-pay | Admitting: *Deleted

## 2016-01-08 NOTE — Telephone Encounter (Signed)
Received request from pharmacy for PA on Viberzi.   PA submitted.   Dx: IBS W D

## 2016-01-13 NOTE — Telephone Encounter (Signed)
Received PA determination.   PA denied.   The initial prescription was not written by, or in consultation with, a GI specialist.   MD please advise.

## 2016-01-13 NOTE — Telephone Encounter (Signed)
Okay to give her more samples if it is helping Advise her she will need to see her GI in order for the medication to be prescribed due to [facility] not us.

## 2016-01-14 NOTE — Telephone Encounter (Signed)
Call placed to patient.   Reports that she has not started prescription.   Samples placed at front desk for patient.   Advised to F/U with GI.

## 2016-01-30 ENCOUNTER — Other Ambulatory Visit: Payer: Self-pay | Admitting: Nurse Practitioner

## 2016-02-03 ENCOUNTER — Ambulatory Visit (INDEPENDENT_AMBULATORY_CARE_PROVIDER_SITE_OTHER): Payer: Medicare Other | Admitting: Nurse Practitioner

## 2016-02-03 ENCOUNTER — Encounter: Payer: Self-pay | Admitting: Nurse Practitioner

## 2016-02-03 VITALS — BP 110/72 | HR 76 | Ht 66.0 in | Wt 212.0 lb

## 2016-02-03 DIAGNOSIS — G43909 Migraine, unspecified, not intractable, without status migrainosus: Secondary | ICD-10-CM

## 2016-02-03 MED ORDER — TOPIRAMATE 50 MG PO TABS: ORAL_TABLET | ORAL | 3 refills | Status: DC

## 2016-02-03 MED ORDER — RIZATRIPTAN BENZOATE 10 MG PO TABS: 10.0000 mg | ORAL_TABLET | ORAL | 2 refills | Status: DC | PRN

## 2016-02-03 NOTE — Patient Instructions (Signed)
Continue Topamax 50 mg in the am and 100mg  in the pm, will refill Continue Maxalt 10 mg when necessary acute migraine will refill Followup yearly and when necessary

## 2016-02-03 NOTE — Progress Notes (Signed)
[facility]  PATIENT: Brooke Schneider DOB: November 20, 1950   REASON FOR VISIT: Follow-up for headaches HISTORY FROM: Patient    HISTORY OF PRESENT ILLNESS:[name], 65 year old female returns for followup.  She has a history of headaches since June 2012 starting at the mid left parietal area going to the frontal area and then will spread across the whole frontal area sometimes she will have pain on her ear and down the left side of her neck. The pain was initially like a sharp pain which would last for 2-3 hours, she was taking Maxalt with relief.  She is also taking Topamax twice daily. She did have some mild blurred vision and noise discomfort with these headaches initially. Denies photophobia, nausea, vomiting or dizziness.  Headaches occur about once  every  week and some weeks not any. Her Topamax was increased in February 2016 to 50 mg in the morning and 100 at night. She feels that her current regimen is working well and that the headaches that she has are mild. She has retired from Engineer, manufacturing. She has a mother living who has memory loss and she says this is stressful for her. She returns for reevaluation. She needs refills on her medication    REVIEW OF SYSTEMS: Full 14 system review of systems performed and notable only for those listed, all others are neg:  Constitutional: Fatigue Cardiovascular: neg Ear/Nose/Throat: neg  Skin: neg Eyes: neg Respiratory: Shortness of breath Gastroitestinal:  Hematology/Lymphatic: neg  Endocrine: neg Musculoskeletal:neg Allergy/Immunology: neg Neurological: Headaches Psychiatric:  Sleep : neg   ALLERGIES: Allergies  Allergen Reactions  . Sulfamethoxazole-Trimethoprim Swelling  . Metronidazole Itching and Rash    HOME MEDICATIONS: Outpatient Medications Prior to Visit  Medication Sig Dispense Refill  . budesonide-formoterol (SYMBICORT) 80-4.5 MCG/ACT inhaler Inhale 1 puff into the lungs 2 (two)  times daily. 3 Inhaler 3  . buPROPion (WELLBUTRIN XL) 150 MG 24 hr tablet TAKE 1 TABLET DAILY 90 tablet 3  . Calcium Carbonate-Vitamin D (CALCIUM 600+D) 600-400 MG-UNIT per tablet Take 1 tablet by mouth 2 (two) times daily.     . cetirizine (ZYRTEC) 10 MG tablet Take 1 tablet (10 mg total) by mouth daily. 90 tablet 3  . dicyclomine (BENTYL) 20 MG tablet Take 1 tablet (20 mg total) by mouth 4 (four) times daily -  before meals and at bedtime. 360 tablet 3  . DIVIGEL 0.25 MG/0.25GM GEL Apply 1 application topically daily. Thigh area    . Eluxadoline (VIBERZI) 75 MG TABS Take 1 capsule by mouth 2 (two) times daily. 60 tablet 0  . fluticasone (FLONASE) 50 MCG/ACT nasal spray Place 2 sprays into both nostrils daily. 48 g 3  . ipratropium (ATROVENT) 0.03 % nasal spray Place 2 sprays into both nostrils every 12 (twelve) hours. 30 mL 0  . levothyroxine (SYNTHROID, LEVOTHROID) 112 MCG tablet TAKE 1 TABLET DAILY 90 tablet 3  . montelukast (SINGULAIR) 10 MG tablet Take 1 tablet (10 mg total) by mouth at bedtime. 90 tablet 3  . Multiple Vitamins-Minerals (MULTIVITAMIN WITH MINERALS) tablet Take 1 tablet by mouth daily.    Marland Kitchen omeprazole (PRILOSEC) 20 MG capsule TAKE 1 CAPSULE DAILY 90 capsule 3  . PROAIR HFA 108 (90 BASE) MCG/ACT inhaler INHALE 2 PUFFS INTO THE LUNGS EVERY 6 HOURS AS NEEDED FOR WHEEZING 7 each 3  . Probiotic Product (ALIGN) 4 MG CAPS Take by mouth.    . propranolol ER (INDERAL LA) 60 MG 24 hr capsule TAKE 1 CAPSULE DAILY  90 capsule 3  . rizatriptan (MAXALT) 10 MG tablet Take 1 tablet (10 mg total) by mouth as needed for migraine. Reported on 08/12/2015 18 tablet 1  . topiramate (TOPAMAX) 50 MG tablet TAKE 1 TABLET (50 MG) IN THE MORNING AND 2 TABLETS (100 MG) AT BEDTIME 270 tablet 3  . zolpidem (AMBIEN) 10 MG tablet Take 1 tablet (10 mg total) by mouth at bedtime as needed for sleep. 90 tablet 1   No facility-administered medications prior to visit.     PAST MEDICAL HISTORY: Past Medical  History:  Diagnosis Date  . Allergic rhinitis   . Asthma   . Depression   . Goiter   . Goiter   . Headache(784.0)     PAST SURGICAL HISTORY: Past Surgical History:  Procedure Laterality Date  . ABDOMINAL HYSTERECTOMY    . CHOLECYSTECTOMY N/A 11/02/2013   Procedure: LAPAROSCOPIC CHOLECYSTECTOMY ;  Surgeon: Cherylynn Ridges, MD;  Location: [facility] OR;  Service: General;  Laterality: N/A;  . KNEE SURGERY    . THYROIDECTOMY      FAMILY HISTORY: Family History  Problem Relation Age of Onset  . Allergies Mother   . ALS Father   . Prostate cancer Father     SOCIAL HISTORY: Social History   Social History  . Marital status: Married    Spouse name: [name]  . Number of children: 2  . Years of education: college   Occupational History  . housewife    Social History Main Topics  . Smoking status: Passive Smoke Exposure - Never Smoker  . Smokeless tobacco: Never Used     Comment: husband smoking cigars  . Alcohol use Yes     Comment: occ  . Drug use: No  . Sexual activity: Not Currently   Other Topics Concern  . Not on file   Social History Narrative   Patient lives at home with her husband [name]).   Retired.   Education college B.S.   Right handed.   Caffeine one cup of coffee daily.     PHYSICAL EXAM  Vitals:   02/03/16 1443  Weight: 212 lb (96.2 kg)  Height: 5\' 6"  (1.676 m)   Body mass index is 34.22 kg/m. General: well developed, well nourished, seated, in no evident distress  Head: head normocephalic and atraumatic. Oropharynx benign  Neck: supple with no carotid bruits  Cardiovascular: regular rate and rhythm, no murmurs  Neurologic Exam  Mental Status: Awake and fully alert. Oriented to place and time. Follows all commands. Speech and language normal.  Cranial Nerves: Pupils equal, briskly reactive to light. Extraocular movements full without nystagmus. Visual fields full to confrontation. Hearing intact and symmetric to finger snap. Facial sensation  intact. Face, tongue, palate move normally and symmetrically. Neck flexion and extension normal.  Motor: Normal bulk and tone. Normal strength in all tested extremity muscles.No focal weakness  Coordination: Rapid alternating movements normal in all extremities. Finger-to-nose and heel-to-shin performed accurately bilaterally.  Gait and Station: Arises from chair without difficulty. Stance is normal. . Able to heel, toe and tandem walk without difficulty.  Reflexes: 2+ and symmetric. Toes downgoing.  DIAGNOSTIC DATA (LABS, IMAGING, TESTING) - I reviewed patient records, labs, notes, testing and imaging myself where available.  Lab Results  Component Value Date   WBC 5.2 10/17/2015   HGB 14.2 10/17/2015   HCT 41.9 10/17/2015   MCV 92.7 10/17/2015   PLT 239 10/17/2015      Component Value Date/Time   NA 142  10/17/2015 1603   K 3.8 10/17/2015 1603   CL 109 10/17/2015 1603   CO2 22 10/17/2015 1603   GLUCOSE 82 10/17/2015 1603   BUN 11 10/17/2015 1603   CREATININE 0.90 10/17/2015 1603   CALCIUM 9.0 10/17/2015 1603   PROT 7.2 10/17/2015 1603   ALBUMIN 4.1 10/17/2015 1603   AST 15 10/17/2015 1603   ALT 15 10/17/2015 1603   ALKPHOS 46 10/17/2015 1603   BILITOT 0.3 10/17/2015 1603   GFRNONAA >60 08/02/2015 1359   GFRNONAA 68 06/11/2014 1534   GFRAA >60 08/02/2015 1359   GFRAA 79 06/11/2014 1534    ASSESSMENT AND PLAN  65 y.o. year old female  has a past medical history of Asthma; Allergic rhinitis; Depression;  and Headache(784.0). here to follow-up for her headaches.The patient is a current patient of Dr. Terrace ArabiaYan  who is out of the office today . This note is sent to the work in doctor.     Continue Topamax 50 mg in the am and 100mg  in the pm, will refill Continue Maxalt 10 mg when necessary acute migraine will refill Followup yearly and when necessary Call for increase in headaches Nilda RiggsNancy Carolyn Graylin Sperling, [facility] CenterGNP, [facility] CenterBC, APRN  [facility] [address], [address] [place], [place] [address] [phone]

## 2016-02-03 NOTE — Progress Notes (Signed)
I agree with the assessment and plan as directed by NP .The patient is known to me .   Tanisha Lutes, MD  

## 2016-02-10 ENCOUNTER — Encounter: Payer: Self-pay | Admitting: Family Medicine

## 2016-02-10 ENCOUNTER — Ambulatory Visit (INDEPENDENT_AMBULATORY_CARE_PROVIDER_SITE_OTHER): Payer: Medicare Other | Admitting: Family Medicine

## 2016-02-10 VITALS — BP 122/70 | HR 72 | Temp 98.5°F | Resp 14 | Ht 66.0 in | Wt 208.0 lb

## 2016-02-10 DIAGNOSIS — J4520 Mild intermittent asthma, uncomplicated: Secondary | ICD-10-CM | POA: Diagnosis not present

## 2016-02-10 DIAGNOSIS — Z23 Encounter for immunization: Secondary | ICD-10-CM | POA: Diagnosis not present

## 2016-02-10 DIAGNOSIS — E890 Postprocedural hypothyroidism: Secondary | ICD-10-CM | POA: Diagnosis not present

## 2016-02-10 MED ORDER — LORATADINE 10 MG PO TABS: 10.0000 mg | ORAL_TABLET | Freq: Every day | ORAL | 3 refills | Status: DC

## 2016-02-10 NOTE — Progress Notes (Signed)
   Subjective:    Patient ID: Brooke Schneider, female    DOB: 02/06/1951, 65 y.o.   MRN: [id number]  Patient presents for Follow-up (is not fasting) and Medication Management (would like to discuss changes from Neuro) Patient here to discuss her medications. She no longer requires the Viberzi her bowels are back to her baseline for the constipated and if she is not taking her Metamucil and using her Colace. She is also out of her probiotic.  Hypothyroidism she felt like she's been more fatigued recently she gets a strange sensation in her chest and notices changes on her for head when her thyroid is messing up. She would like to change her dose. She did have thyroid function studies done about 4 months ago which her TSH was 4.2 and T3 2.7, none recent to compare  With regards to her asthma and allergy medicines she is doing well with the Symbicort. She would like to change back to Claritin for her allergy pill. She is considering setting  up appointment with her allergist just to review her medications.   No change to migraine meds per neurology   Review Of Systems:  GEN- + fatigue, denies fever, weight loss,weakness, recent illness HEENT- denies eye drainage, change in vision, nasal discharge, CVS- denies chest pain, palpitations RESP- denies SOB, cough, wheeze ABD- denies N/V, change in stools, abd pain GU- denies dysuria, hematuria, dribbling, incontinence MSK- denies joint pain, muscle aches, injury Neuro- denies headache, dizziness, syncope, seizure activity       Objective:    BP 122/70 (BP Location: Left Arm, Patient Position: Sitting, Cuff Size: Large)   Pulse 72   Temp 98.5 F (36.9 C) (Oral)   Resp 14   Ht 5\' 6"  (1.676 m)   Wt 208 lb (94.3 kg)   BMI 33.57 kg/m  GEN- NAD, alert and oriented x3 HEENT- PERRL, EOMI, non injected sclera, pink conjunctiva, MMM, oropharynx clear Neck- Supple, s/p thyroidectomy  CVS- RRR, no murmur RESP-CTAB EXT- No edema Pulses- Radial  2+        Assessment & Plan:      Problem List Items Addressed This Visit    Hypothyroidism - Primary    Recheck levels pt has had thyroid dysfunction for quite some time. Discussed taking her medication she has not been taking first thing in the morning and at times taking with other meds and food. We'll consider increasing her to 125 g to pain on the results of the labs to see if she gets more of a symptomatic improvement in addition to doing the above with her meds.      Relevant Orders   TSH   T3, free   T4, free   Asthma, allergic    Continue Symbicort will change her back to Claritin.  At approximately 20 minutes spent with patient greater than 50% on medication reconciliation and counseling       Other Visit Diagnoses    Need for prophylactic vaccination and inoculation against influenza       Relevant Orders   Flu Vaccine QUAD 36+ mos PF IM (Fluarix & Fluzone Quad PF) (Completed)      Note: This dictation was prepared with Dragon dictation along with smaller phrase technology. Any transcriptional errors that result from this process are unintentional.

## 2016-02-10 NOTE — Assessment & Plan Note (Signed)
Recheck levels pt has had thyroid dysfunction for quite some time. Discussed taking her medication she has not been taking first thing in the morning and at times taking with other meds and food. We'll consider increasing her to 125 g to pain on the results of the labs to see if she gets more of a symptomatic improvement in addition to doing the above with her meds.

## 2016-02-10 NOTE — Patient Instructions (Addendum)
Change to claritin [employer]ign We will check thyroid levels  F/U 4 months

## 2016-02-10 NOTE — Assessment & Plan Note (Signed)
Continue Symbicort will change her back to Claritin.  At approximately 20 minutes spent with patient greater than 50% on medication reconciliation and counseling

## 2016-02-11 ENCOUNTER — Other Ambulatory Visit: Payer: Self-pay | Admitting: *Deleted

## 2016-02-11 DIAGNOSIS — R7989 Other specified abnormal findings of blood chemistry: Secondary | ICD-10-CM

## 2016-02-11 DIAGNOSIS — E039 Hypothyroidism, unspecified: Secondary | ICD-10-CM

## 2016-02-11 LAB — T3, FREE: T3, Free: 3 pg/mL (ref 2.3–4.2)

## 2016-02-11 LAB — T4, FREE: FREE T4: 1.1 ng/dL (ref 0.8–1.8)

## 2016-02-11 LAB — TSH: TSH: 5.01 mIU/L — ABNORMAL HIGH

## 2016-02-11 MED ORDER — LEVOTHYROXINE SODIUM 125 MCG PO TABS: 125.0000 ug | ORAL_TABLET | Freq: Every day | ORAL | 1 refills | Status: DC

## 2016-02-13 ENCOUNTER — Other Ambulatory Visit: Payer: Self-pay | Admitting: *Deleted

## 2016-03-25 ENCOUNTER — Telehealth: Payer: Self-pay | Admitting: Nurse Practitioner

## 2016-03-25 MED ORDER — RIZATRIPTAN BENZOATE 10 MG PO TABS: 10.0000 mg | ORAL_TABLET | ORAL | 2 refills | Status: DC | PRN

## 2016-03-25 NOTE — Telephone Encounter (Signed)
Patient called to request refill of rizatriptan (MAXALT) 10 MG tablet to [facility].

## 2016-03-25 NOTE — Addendum Note (Signed)
Addended byHermenia Fiscal: Tyleek Smick on: 03/25/2016 04:15 PM   Modules accepted: Orders

## 2016-04-06 ENCOUNTER — Other Ambulatory Visit: Payer: Medicare Other

## 2016-04-06 ENCOUNTER — Telehealth: Payer: Self-pay | Admitting: Family Medicine

## 2016-04-06 DIAGNOSIS — R7989 Other specified abnormal findings of blood chemistry: Secondary | ICD-10-CM

## 2016-04-06 DIAGNOSIS — R946 Abnormal results of thyroid function studies: Secondary | ICD-10-CM | POA: Diagnosis not present

## 2016-04-06 DIAGNOSIS — E039 Hypothyroidism, unspecified: Secondary | ICD-10-CM | POA: Diagnosis not present

## 2016-04-06 DIAGNOSIS — K580 Irritable bowel syndrome with diarrhea: Secondary | ICD-10-CM

## 2016-04-06 LAB — T4, FREE: FREE T4: 1.2 ng/dL (ref 0.8–1.8)

## 2016-04-06 LAB — T3, FREE: T3 FREE: 3.5 pg/mL (ref 2.3–4.2)

## 2016-04-06 LAB — TSH: TSH: 3.01 mIU/L

## 2016-04-06 MED ORDER — LEVOTHYROXINE SODIUM 125 MCG PO TABS: 125.0000 ug | ORAL_TABLET | Freq: Every day | ORAL | 1 refills | Status: DC

## 2016-04-06 MED ORDER — LEVOTHYROXINE SODIUM 125 MCG PO TABS
125.0000 ug | ORAL_TABLET | Freq: Every day | ORAL | 1 refills | Status: DC
Start: 2016-04-06 — End: 2016-04-06

## 2016-04-06 NOTE — Telephone Encounter (Signed)
Ok to refill Ambien to mail order?  Also, received fa from [facility] in regards to Bentyl. Advised that bentyl is on back order.   MD please advise.

## 2016-04-06 NOTE — Telephone Encounter (Signed)
She also needs a refill called into [facility] for her zolpidem tart tabs 10 mg

## 2016-04-06 NOTE — Telephone Encounter (Signed)
When patient had lab work done today she also requested a refill on her levothyroxine 125 mcg tab [facility] she has enough for 9 days so [facility]  CB# [phone]

## 2016-04-06 NOTE — Telephone Encounter (Signed)
Okay to refill the [place] She can take the bentyl script somewhere else

## 2016-04-07 MED ORDER — DICYCLOMINE HCL 20 MG PO TABS: 20.0000 mg | ORAL_TABLET | Freq: Three times a day (TID) | ORAL | 3 refills | Status: DC

## 2016-04-07 MED ORDER — ZOLPIDEM TARTRATE 10 MG PO TABS: 10.0000 mg | ORAL_TABLET | Freq: Every evening | ORAL | 1 refills | Status: DC | PRN

## 2016-04-07 NOTE — Telephone Encounter (Signed)
Prescription faxed.   Call placed to patient. LMTRC.  

## 2016-04-08 NOTE — Telephone Encounter (Signed)
Patient returned call and made aware.

## 2016-04-22 DIAGNOSIS — N760 Acute vaginitis: Secondary | ICD-10-CM | POA: Diagnosis not present

## 2016-04-22 DIAGNOSIS — N390 Urinary tract infection, site not specified: Secondary | ICD-10-CM | POA: Diagnosis not present

## 2016-05-13 ENCOUNTER — Other Ambulatory Visit: Payer: Self-pay | Admitting: Family Medicine

## 2016-06-14 ENCOUNTER — Encounter: Payer: Self-pay | Admitting: Family Medicine

## 2016-06-14 ENCOUNTER — Ambulatory Visit (INDEPENDENT_AMBULATORY_CARE_PROVIDER_SITE_OTHER): Payer: Medicare Other | Admitting: Family Medicine

## 2016-06-14 VITALS — BP 128/78 | HR 82 | Temp 97.9°F | Resp 16 | Ht 66.0 in | Wt 220.0 lb

## 2016-06-14 DIAGNOSIS — M1711 Unilateral primary osteoarthritis, right knee: Secondary | ICD-10-CM

## 2016-06-14 DIAGNOSIS — J4540 Moderate persistent asthma, uncomplicated: Secondary | ICD-10-CM | POA: Diagnosis not present

## 2016-06-14 DIAGNOSIS — Z23 Encounter for immunization: Secondary | ICD-10-CM

## 2016-06-14 DIAGNOSIS — E890 Postprocedural hypothyroidism: Secondary | ICD-10-CM

## 2016-06-14 DIAGNOSIS — G43909 Migraine, unspecified, not intractable, without status migrainosus: Secondary | ICD-10-CM

## 2016-06-14 MED ORDER — RIZATRIPTAN BENZOATE 10 MG PO TABS: 10.0000 mg | ORAL_TABLET | ORAL | 2 refills | Status: DC | PRN

## 2016-06-14 MED ORDER — PREDNISONE 10 MG PO TABS: ORAL_TABLET | ORAL | 0 refills | Status: DC

## 2016-06-14 NOTE — Progress Notes (Signed)
Subjective:    Patient ID: Brooke Schneider, female    DOB: 02/01/1951, 66 y.o.   MRN: [id number]  Patient presents for Follow-up (is not fasting)    Due for prevnar 13    Asthma/allergies- has cat allergy, She noticed that there is some cats in her crawl space area and around her home that her neighborhood cats. She's noticed more if GI sneezing and now some wheezing that has been going on for the past couple weeks. She denies any fever or shortness of breath. She actually stopped the Symbicort and tried to go back on her Flovent but this did not help her symptoms. She is also using her albuterol as needed.  She still taking her migraine medication as prescribed and has not had any difficulties but needs refills on her Maxalt.  She's felt much better since we adjusted her thyroid dose her thyroid levels have also leveled out.  He also complains of right knee pain she has known osteoarthritis as had arthroscopic done quite some time ago. She no longer has that same orthopedics. She has noted more pain with walking up steps and sitting long. The time it seems to also be swelling.  Review Of Systems:  GEN- denies fatigue, fever, weight loss,weakness, recent illness HEENT- denies eye drainage, change in vision, nasal discharge, CVS- denies chest pain, palpitations RESP- denies SOB, cough, wheeze ABD- denies N/V, change in stools, abd pain GU- denies dysuria, hematuria, dribbling, incontinence MSK- + joint pain, muscle aches, injury Neuro- denies headache, dizziness, syncope, seizure activity       Objective:    BP 128/78 (BP Location: Left Arm, Patient Position: Sitting, Cuff Size: Large)   Pulse 82   Temp 97.9 F (36.6 C) (Oral)   Resp 16   Ht 5\' 6"  (1.676 m)   Wt 220 lb (99.8 kg)   SpO2 99%   BMI 35.51 kg/m  GEN- NAD, alert and oriented x3 HEENT- PERRL, EOMI, non injected sclera, pink conjunctiva, MMM, oropharynx clear Neck- Supple,  CVS- RRR, no murmur RESP-scattered  wheeze, good air movement, normal WOB  MSK- Right knee- fair ROM, mild crepitus, mild swelling compared to left  EXT- No edema Pulses- Radial, DP- 2+        Assessment & Plan:      Problem List Items Addressed This Visit    Migraine    Continue current regimen I have refilled her Maxalt as well she is on prophylaxis       Relevant Medications   rizatriptan (MAXALT) 10 MG tablet   Hypothyroidism    No change to dose, improved symptoms       Asthma, allergic - Primary    Allergic asthma which is now flaring up with the recent cats. I've given her prednisone taper Fever or other allergy medications as well as use of albuterol as needed  Recommend that she restart the Symbicort      Relevant Medications   predniSONE (DELTASONE) 10 MG tablet    Other Visit Diagnoses    Primary osteoarthritis of right knee       Referral to orthopedics that she has had arthroscopic done on this knee in the past.   Relevant Medications   predniSONE (DELTASONE) 10 MG tablet   Other Relevant Orders   Ambulatory referral to Orthopedic Surgery   Need for prophylactic vaccination against Streptococcus pneumoniae (pneumococcus)          Note: This dictation was prepared with Dragon dictation along with  smaller phrase technology. Any transcriptional errors that result from this process are unintentional.

## 2016-06-14 NOTE — Patient Instructions (Addendum)
Referral to orthopedics for knee -murphy wainer Take the prednisone as prescribed  Continue thyroid medication at the same dose Prevnar 13  F/U 3 months for PHYSICAL- Come fasting

## 2016-06-15 NOTE — Assessment & Plan Note (Signed)
No change to dose, improved symptoms

## 2016-06-15 NOTE — Assessment & Plan Note (Signed)
Continue current regimen I have refilled her Maxalt as well she is on prophylaxis

## 2016-06-15 NOTE — Assessment & Plan Note (Addendum)
Allergic asthma which is now flaring up with the recent cats. I've given her prednisone taper Fever or other allergy medications as well as use of albuterol as needed  Recommend that she restart the Symbicort

## 2016-07-06 ENCOUNTER — Ambulatory Visit (INDEPENDENT_AMBULATORY_CARE_PROVIDER_SITE_OTHER): Payer: Medicare Other | Admitting: Family Medicine

## 2016-07-06 ENCOUNTER — Encounter: Payer: Self-pay | Admitting: Family Medicine

## 2016-07-06 VITALS — BP 120/78 | HR 88 | Temp 98.5°F | Resp 14 | Ht 66.0 in | Wt 219.0 lb

## 2016-07-06 DIAGNOSIS — J4540 Moderate persistent asthma, uncomplicated: Secondary | ICD-10-CM | POA: Diagnosis not present

## 2016-07-06 DIAGNOSIS — B360 Pityriasis versicolor: Secondary | ICD-10-CM

## 2016-07-06 MED ORDER — KETOCONAZOLE 2 % EX SHAM: 1.0000 "application " | MEDICATED_SHAMPOO | CUTANEOUS | 1 refills | Status: DC

## 2016-07-06 MED ORDER — PREDNISONE 10 MG PO TABS: ORAL_TABLET | ORAL | 0 refills | Status: DC

## 2016-07-06 MED ORDER — METHYLPREDNISOLONE ACETATE 40 MG/ML IJ SUSP
40.0000 mg | Freq: Once | INTRAMUSCULAR | Status: AC
  Administered 2016-07-06: 40 mg via INTRAMUSCULAR

## 2016-07-06 NOTE — Assessment & Plan Note (Signed)
Continued exposure with wheezing , cough Continue symbicort Given Depo Medrol, another prednisone taper which helped her symptoms before No sign of infection  Also given Xyzal to try , continue singulair

## 2016-07-06 NOTE — Assessment & Plan Note (Signed)
Treat with nizoral shampoo BIW

## 2016-07-06 NOTE — Progress Notes (Signed)
   Subjective:    Patient ID: Brooke Schneider, female    DOB: 11/18/1950, 66 y.o.   MRN: [id number]  Patient presents for Asthma exacerbation (states cat finally moved out from under house, but she still has sx- itchy, sneezing, cough, wheezing)  Pt here with cough, congestion, wheezing for past    Has underling asthma and allergies. At last visit 3 weeks ago, advised to restart Symbicort as this was providing better relief than flovent, also using albuterol. She was prescribed prednisone taper as she had had exosure to cats and had some wheeze and cough, but no overt illness at that time. She improved with the prednisone but states that the catheter still on her her house she finally was able to remove them and now has been cleaning and getting with any dander as felt herself wheezing again the past week. She's been using her albuterol inhaler daily which helps. She is also taking some Benadryl  She also was concerned about a rash on her chest states that a popsicle typically during the winter other times when she is sweaty. She's been treated with antifungal and this is cleared up in the past. She occasionally has some itching.      Review Of Systems:  GEN- denies fatigue, fever, weight loss,weakness, recent illness HEENT- denies eye drainage, change in vision, nasal discharge, CVS- denies chest pain, palpitations RESP- denies SOB, cough, wheeze ABD- denies N/V, change in stools, abd pain GU- denies dysuria, hematuria, dribbling, incontinence MSK- denies joint pain, muscle aches, injury Neuro- denies headache, dizziness, syncope, seizure activity       Objective:    BP 120/78 (BP Location: Left Arm, Patient Position: Sitting, Cuff Size: Large)   Pulse 88   Temp 98.5 F (36.9 C) (Oral)   Resp 14   Ht 5\' 6"  (1.676 m)   Wt 219 lb (99.3 kg)   SpO2 98%   BMI 35.35 kg/m  GEN- NAD, alert and oriented x3 HEENT- PERRL, EOMI, non injected sclera, pink conjunctiva, MMM, oropharynx clear,  TM clear bilat no effusion,  + no maxillary sinus tenderness, clear  Nasal drainage  Neck- Supple, no LAD CVS- RRR, no murmur RESP-occasional expiratory wheeze, normal WOB, no retractions Skin- hyperpigmented macules scattered across chest mostly over sternum  EXT- No edema Pulses- Radial 2+          Assessment & Plan:      Problem List Items Addressed This Visit    Tinea versicolor    Treat with nizoral shampoo BIW      Asthma, allergic - Primary    Continued exposure with wheezing , cough Continue symbicort Given Depo Medrol, another prednisone taper which helped her symptoms before No sign of infection  Also given Xyzal to try , continue singulair      Relevant Medications   predniSONE (DELTASONE) 10 MG tablet   methylPREDNISolone acetate (DEPO-MEDROL) injection 40 mg (Completed)      Note: This dictation was prepared with Dragon dictation along with smaller phrase technology. Any transcriptional errors that result from this process are unintentional.

## 2016-07-06 NOTE — Patient Instructions (Addendum)
Try the xyzal  Start steroids tomorrow Use the shampoo as directed  F/U as needed

## 2016-07-09 DIAGNOSIS — M1711 Unilateral primary osteoarthritis, right knee: Secondary | ICD-10-CM | POA: Diagnosis not present

## 2016-07-23 DIAGNOSIS — Z1231 Encounter for screening mammogram for malignant neoplasm of breast: Secondary | ICD-10-CM | POA: Diagnosis not present

## 2016-07-23 DIAGNOSIS — Z124 Encounter for screening for malignant neoplasm of cervix: Secondary | ICD-10-CM | POA: Diagnosis not present

## 2016-07-23 DIAGNOSIS — Z6836 Body mass index (BMI) 36.0-36.9, adult: Secondary | ICD-10-CM | POA: Diagnosis not present

## 2016-07-28 ENCOUNTER — Telehealth: Payer: Self-pay | Admitting: Family Medicine

## 2016-07-28 NOTE — Telephone Encounter (Signed)
okay

## 2016-07-28 NOTE — Telephone Encounter (Signed)
Ok to refill?  Ok to send to mail order?

## 2016-07-28 NOTE — Telephone Encounter (Signed)
Pt needs refill on ambien tabs 10mg  sent to mail order pharmacy, is currently out and [facility] - [place], [place] - [place]

## 2016-07-29 MED ORDER — ZOLPIDEM TARTRATE 10 MG PO TABS: 10.0000 mg | ORAL_TABLET | Freq: Every evening | ORAL | 1 refills | Status: DC | PRN

## 2016-07-29 MED ORDER — ZOLPIDEM TARTRATE 10 MG PO TABS: 10.0000 mg | ORAL_TABLET | Freq: Every evening | ORAL | 0 refills | Status: DC | PRN

## 2016-07-29 NOTE — Telephone Encounter (Signed)
Medication called to pharmacy.  Prescription faxed.

## 2016-08-02 ENCOUNTER — Encounter: Payer: Self-pay | Admitting: Family Medicine

## 2016-08-02 ENCOUNTER — Ambulatory Visit (INDEPENDENT_AMBULATORY_CARE_PROVIDER_SITE_OTHER): Payer: Medicare Other | Admitting: Family Medicine

## 2016-08-02 VITALS — BP 122/78 | HR 84 | Temp 98.9°F | Resp 16 | Ht 66.0 in | Wt 216.0 lb

## 2016-08-02 DIAGNOSIS — Z7189 Other specified counseling: Secondary | ICD-10-CM | POA: Diagnosis not present

## 2016-08-02 DIAGNOSIS — J4540 Moderate persistent asthma, uncomplicated: Secondary | ICD-10-CM

## 2016-08-02 DIAGNOSIS — Z7184 Encounter for health counseling related to travel: Secondary | ICD-10-CM

## 2016-08-02 DIAGNOSIS — E890 Postprocedural hypothyroidism: Secondary | ICD-10-CM | POA: Diagnosis not present

## 2016-08-02 MED ORDER — LORATADINE 10 MG PO TABS: 10.0000 mg | ORAL_TABLET | Freq: Every day | ORAL | 0 refills | Status: DC

## 2016-08-02 MED ORDER — LEVOCETIRIZINE DIHYDROCHLORIDE 5 MG PO TABS: 5.0000 mg | ORAL_TABLET | Freq: Every evening | ORAL | 2 refills | Status: DC

## 2016-08-02 NOTE — Progress Notes (Signed)
   Subjective:    Patient ID: Brooke Schneider, female    DOB: 08/09/1950, 66 y.o.   MRN: [id number]  Patient presents for Follow-up (wants to discuss travel and how it affects her heatlh ) Patient here to discuss her upcoming trip to [place] to see her son. She wanted to review her medications and her diagnoses and see if they will be affected by her traveling. She is traveled multiple times as her husband is also in the [employer] she states that she has moved 22 times previously had all of her vaccinations for the different countries has not been out of the country for the past 20 years.  She has no new concerns today her breathing is doing well the Xyzal works better for her allergies as well as the Symbicort.  Medications were reviewed in detail as well as her diagnosis of hypothyroidism asthma allergies and ear bowel syndrome none of which should affect her traveling.  No she has used Valium prior to child for on long flights like to have this on hand when it comes that time in May.   Review Of Systems:  GEN- denies fatigue, fever, weight loss,weakness, recent illness HEENT- denies eye drainage, change in vision, nasal discharge, CVS- denies chest pain, palpitations RESP- denies SOB, cough, wheeze ABD- denies N/V, change in stools, abd pain GU- denies dysuria, hematuria, dribbling, incontinence MSK- denies joint pain, muscle aches, injury Neuro- denies headache, dizziness, syncope, seizure activity       Objective:    BP 122/78   Pulse 84   Temp 98.9 F (37.2 C) (Oral)   Resp 16   Ht 5\' 6"  (1.676 m)   Wt 216 lb (98 kg)   SpO2 98%   BMI 34.86 kg/m  GEN- NAD, alert and oriented x3 HEENT- PERRL, EOMI, non injected sclera, pink conjunctiva, MMM, oropharynx clear Neck- Supple, no thyromegaly CVS- RRR, no murmur RESP-CTAB EXT- No edema Pulses- Radial  2+        Assessment & Plan:    Approximately 20 minutes spent with patient greater than 50% spent on counseling for  her medical problems and review of her medications   Problem List Items Addressed This Visit    Hypothyroidism    Thyroid function at goal no change in medication      Asthma, allergic    Breathing stable with her singular Symbicort. I think as long as her breathing is stable and she can trial as long she has all of her medications. We reviewed all of her medications I see no reason why these with repeat her from traveling. She does need to go to a travel clinic so she has the appropriate vaccinations I did give her resources for this locally.       Other Visit Diagnoses    Travel advice encounter    -  Primary      Note: This dictation was prepared with Dragon dictation along with smaller phrase technology. Any transcriptional errors that result from this process are unintentional.

## 2016-08-02 NOTE — Patient Instructions (Addendum)
Call the travel clinic for your shots Continue your current medications F/U as previous

## 2016-08-03 ENCOUNTER — Encounter: Payer: Self-pay | Admitting: Family Medicine

## 2016-08-03 NOTE — Assessment & Plan Note (Signed)
Breathing stable with her singular Symbicort. I think as long as her breathing is stable and she can trial as long she has all of her medications. We reviewed all of her medications I see no reason why these with repeat her from traveling. She does need to go to a travel clinic so she has the appropriate vaccinations I did give her resources for this locally.

## 2016-08-03 NOTE — Assessment & Plan Note (Signed)
Thyroid function at goal no change in medication

## 2016-08-20 ENCOUNTER — Telehealth: Payer: Self-pay | Admitting: Family Medicine

## 2016-08-20 NOTE — Telephone Encounter (Signed)
Clearing encounter  °

## 2016-08-20 NOTE — Telephone Encounter (Signed)
Call placed to [facility] and approval given to fill x8 days early.   Was advised that prescription will be <$10 out of pocket.   Call placed to patient and patient made aware via VM.

## 2016-08-20 NOTE — Telephone Encounter (Signed)
okay

## 2016-08-20 NOTE — Telephone Encounter (Signed)
Ok to fill early.   Will advise insurance may not cover.

## 2016-08-20 NOTE — Telephone Encounter (Signed)
Pt came in wanting ambien refilled, she states that she knows it is not time for it to be filled but she has lost her prescription and cannot find it any where. It has been gone for about 3 days now. She took her last pill on Tuesday or Wednesday night. Can we send in a script for it? Please call to advise.

## 2016-08-25 DIAGNOSIS — H25013 Cortical age-related cataract, bilateral: Secondary | ICD-10-CM | POA: Diagnosis not present

## 2016-08-25 DIAGNOSIS — H04123 Dry eye syndrome of bilateral lacrimal glands: Secondary | ICD-10-CM | POA: Diagnosis not present

## 2016-09-08 ENCOUNTER — Other Ambulatory Visit: Payer: Medicare Other

## 2016-09-09 ENCOUNTER — Other Ambulatory Visit: Payer: Medicare Other

## 2016-09-09 DIAGNOSIS — E039 Hypothyroidism, unspecified: Secondary | ICD-10-CM | POA: Diagnosis not present

## 2016-09-09 DIAGNOSIS — Z79899 Other long term (current) drug therapy: Secondary | ICD-10-CM | POA: Diagnosis not present

## 2016-09-09 DIAGNOSIS — Z0000 Encounter for general adult medical examination without abnormal findings: Secondary | ICD-10-CM

## 2016-09-09 LAB — CBC WITH DIFFERENTIAL/PLATELET
Basophils Absolute: 0 cells/uL (ref 0–200)
Basophils Relative: 0 %
Eosinophils Absolute: 240 cells/uL (ref 15–500)
Eosinophils Relative: 5 %
HEMATOCRIT: 40.3 % (ref 35.0–45.0)
Hemoglobin: 13.3 g/dL (ref 12.0–15.0)
Lymphocytes Relative: 46 %
Lymphs Abs: 2208 cells/uL (ref 850–3900)
MCH: 30.5 pg (ref 27.0–33.0)
MCHC: 33 g/dL (ref 32.0–36.0)
MCV: 92.4 fL (ref 80.0–100.0)
MONO ABS: 384 {cells}/uL (ref 200–950)
MPV: 10.5 fL (ref 7.5–12.5)
Monocytes Relative: 8 %
Neutro Abs: 1968 cells/uL (ref 1500–7800)
Neutrophils Relative %: 41 %
Platelets: 332 10*3/uL (ref 140–400)
RBC: 4.36 MIL/uL (ref 3.80–5.10)
RDW: 15.1 % — ABNORMAL HIGH (ref 11.0–15.0)
WBC: 4.8 10*3/uL (ref 3.8–10.8)

## 2016-09-09 LAB — TSH: TSH: 6.28 m[IU]/L — AB

## 2016-09-09 LAB — COMPLETE METABOLIC PANEL WITH GFR
ALT: 14 U/L (ref 6–29)
AST: 13 U/L (ref 10–35)
Albumin: 3.9 g/dL (ref 3.6–5.1)
Alkaline Phosphatase: 51 U/L (ref 33–130)
BUN: 9 mg/dL (ref 7–25)
CHLORIDE: 109 mmol/L (ref 98–110)
CO2: 19 mmol/L — AB (ref 20–31)
Calcium: 9 mg/dL (ref 8.6–10.4)
Creat: 0.96 mg/dL (ref 0.50–0.99)
GFR, Est African American: 72 mL/min (ref >=60)
GFR, Est Non African American: 62 mL/min (ref >=60)
GLUCOSE: 94 mg/dL (ref 70–99)
POTASSIUM: 4 mmol/L (ref 3.5–5.3)
SODIUM: 140 mmol/L (ref 135–146)
Total Bilirubin: 0.3 mg/dL (ref 0.2–1.2)
Total Protein: 7 g/dL (ref 6.1–8.1)

## 2016-09-09 LAB — T4, FREE: Free T4: 1.1 ng/dL (ref 0.8–1.8)

## 2016-09-09 LAB — LIPID PANEL
CHOL/HDL RATIO: 3.8 ratio (ref <5.0)
Cholesterol: 171 mg/dL (ref <200)
HDL: 45 mg/dL — ABNORMAL LOW (ref >50)
LDL Cholesterol: 97 mg/dL (ref <100)
Triglycerides: 144 mg/dL (ref <150)
VLDL: 29 mg/dL (ref <30)

## 2016-09-09 LAB — T3, FREE: T3, Free: 3.3 pg/mL (ref 2.3–4.2)

## 2016-09-13 ENCOUNTER — Ambulatory Visit (INDEPENDENT_AMBULATORY_CARE_PROVIDER_SITE_OTHER): Payer: Medicare Other | Admitting: Family Medicine

## 2016-09-13 ENCOUNTER — Encounter: Payer: Self-pay | Admitting: Family Medicine

## 2016-09-13 VITALS — BP 118/70 | HR 80 | Resp 14 | Ht 66.0 in | Wt 212.0 lb

## 2016-09-13 DIAGNOSIS — F329 Major depressive disorder, single episode, unspecified: Secondary | ICD-10-CM | POA: Insufficient documentation

## 2016-09-13 DIAGNOSIS — F331 Major depressive disorder, recurrent, moderate: Secondary | ICD-10-CM | POA: Diagnosis not present

## 2016-09-13 DIAGNOSIS — Z0000 Encounter for general adult medical examination without abnormal findings: Secondary | ICD-10-CM

## 2016-09-13 DIAGNOSIS — H1013 Acute atopic conjunctivitis, bilateral: Secondary | ICD-10-CM

## 2016-09-13 DIAGNOSIS — E890 Postprocedural hypothyroidism: Secondary | ICD-10-CM | POA: Diagnosis not present

## 2016-09-13 DIAGNOSIS — F5101 Primary insomnia: Secondary | ICD-10-CM | POA: Diagnosis not present

## 2016-09-13 DIAGNOSIS — Z0001 Encounter for general adult medical examination with abnormal findings: Secondary | ICD-10-CM | POA: Diagnosis not present

## 2016-09-13 DIAGNOSIS — H9193 Unspecified hearing loss, bilateral: Secondary | ICD-10-CM | POA: Diagnosis not present

## 2016-09-13 DIAGNOSIS — H1010 Acute atopic conjunctivitis, unspecified eye: Secondary | ICD-10-CM | POA: Insufficient documentation

## 2016-09-13 DIAGNOSIS — K582 Mixed irritable bowel syndrome: Secondary | ICD-10-CM

## 2016-09-13 MED ORDER — RIZATRIPTAN BENZOATE 10 MG PO TABS: 10.0000 mg | ORAL_TABLET | ORAL | 2 refills | Status: DC | PRN

## 2016-09-13 MED ORDER — ZOLPIDEM TARTRATE 10 MG PO TABS: 10.0000 mg | ORAL_TABLET | Freq: Every evening | ORAL | 1 refills | Status: DC | PRN

## 2016-09-13 MED ORDER — MONTELUKAST SODIUM 10 MG PO TABS: 10.0000 mg | ORAL_TABLET | Freq: Every day | ORAL | 3 refills | Status: DC

## 2016-09-13 NOTE — Assessment & Plan Note (Signed)
Continue ambien Advised against long daytime naps as well

## 2016-09-13 NOTE — Assessment & Plan Note (Signed)
More allergic symptoms, use eye drops If bacterial sets in will call in polytrim

## 2016-09-13 NOTE — Progress Notes (Signed)
Subjective:   Patient presents for Medicare Annual/Subsequent preventive examination.   Has had some drainage to left eye with crusiting but mostly clear drainage , has used some eye allergy drops recently which helped     Hearing, has had problenms in past , feels like it has worsened   Feels pressure in rectal areas after BM, has fiber that she takes ,no blood in stool    Had loose bowels, wit her IBS a few weeks ago, took immodium, align, bentyl No better   Review Past Medical/Family/Social: Per EMR    Risk Factors  Current exercise habits: walks Dietary issues discussed: Yes   Cardiac risk factors: Obesity (BMI >= 30 kg/m2).   Depression Screen  (Note: if answer to either of the following is "Yes", a more complete depression screening is indicated)  Over the past two weeks, have you felt down, depressed or hopeless? yes Over the past two weeks, have you felt little interest or pleasure in doing things? No Have you lost interest or pleasure in daily life? No Do you often feel hopeless? No Do you cry easily over simple problems? No   Activities of Daily Living  In your present state of health, do you have any difficulty performing the following activities?:  Driving? No  Managing money? yes  Feeding yourself? No  Getting from bed to chair? yes  Climbing a flight of stairs? Yes  -- Knee pain following with orthopedics  Preparing food and eating?: No  Bathing or showering? No  Getting dressed: No  Getting to the toilet? No  Using the toilet:No  Moving around from place to place: yes In the past year have you fallen or had a near fall?:No  Are you sexually active? No  Do you have more than one partner? No   Hearing Difficulties: Yes Do you often ask people to speak up or repeat themselves? yes Do you experience ringing or noises in your ears? sometimes Do you have difficulty understanding soft or whispered voices? Yes Do you feel that you have a problem with memory?  No Do you often misplace items? Yes  Do you feel safe at home? Yes  Cognitive Testing  Alert? Yes Normal Appearance?Yes  Oriented to person? Yes Place? Yes  Time? Yes  Recall of three objects? Yes  Can perform simple calculations? Yes  Displays appropriate judgment?Yes  Can read the correct time from a watch face?Yes   List the Names of Other Physician/Practitioners you currently use:   Dr. Eulah Pont- Orthopedics   Screening Tests / Date Colonoscopy    UTD                 Zostavax - UTD  Mammogram -UTD  Influenza Vaccine UTD pNEUMONIA- Due for Pneumomnia vaccine Tetanus/tdap- Due   ROS: GEN-+fatigue, fever, weight loss,weakness, recent illness HEENT- denies eye drainage, change in vision, nasal discharge, CVS- denies chest pain, palpitations RESP- denies SOB, cough, wheeze ABD- denies N/V, change in stools, abd pain GU- denies dysuria, hematuria, dribbling, incontinence MSK- + joint pain, muscle aches, injury Neuro- denies headache, dizziness, syncope, seizure activity  Physical: GEN- NAD, alert and oriented x3 HEENT- PERRL, EOMI, non injected sclera, pink conjunctiva, MMM, oropharynx clear Neck- Supple, no thryomegaly CVS- RRR, no murmur RESP-CTAB ABD-NABS,soft,NT,ND EXT- No edema Pulses- Radial, DP- 2+    Assessment:    Annual wellness medicare exam   Plan:    During the course of the visit the patient was educated and counseled about appropriate screening  and preventive services including:  Hearing exam in office normal, refer for formal hearing test Bone density- pt to schedule   TDAP due but she is traveling soon, will get at Travel clinic, given info for these clinics  Diet review for nutrition referral? Yes ____ Not Indicated __x__  Patient Instructions (the written plan) was given to the patient.  Medicare Attestation  I have personally reviewed:  The patient's medical and social history  Their use of alcohol, tobacco or illicit drugs  Their  current medications and supplements  The patient's functional ability including ADLs,fall risks, home safety risks, cognitive, and hearing and visual impairment  Diet and physical activities  Evidence for depression or mood disorders  The patient's weight, height, BMI, and visual acuity have been recorded in the chart. I have made referrals, counseling, and provided education to the patient based on review of the above and I have provided the patient with a written personalized care plan for preventive services.

## 2016-09-13 NOTE — Patient Instructions (Addendum)
Travel Clinic- call for your shots  Schedule your Bone Density  Medications refilled  Referral for hearing test  Use your allergy eye drop Recheck thyroid LAB VISIT ONLY IN 2 MONTHS F/U 6 months

## 2016-09-13 NOTE — Assessment & Plan Note (Signed)
IBS symptoms, also has hemorroids, no current issues Advised to use the probiotics daily, can also add her fiber back Colonoscopy UTD

## 2016-09-13 NOTE — Assessment & Plan Note (Signed)
Recheck TFT in 8 weeks, take thyroid replacement in AM, with no other meds/food, she was binding her replacement

## 2016-09-13 NOTE — Assessment & Plan Note (Signed)
On wellbutrin.  ?

## 2016-10-22 IMAGING — DX DG CHEST 2V
2 series · 2 of 2 positions shown · non-contrast
Comparison: 04/21/2011 and earlier.

CLINICAL DATA: 64-year-old presenting with 1 day history of cough,
shortness of breath and sore throat. Current history of asthma.

EXAM:
CHEST  2 VIEW

[chest pa]
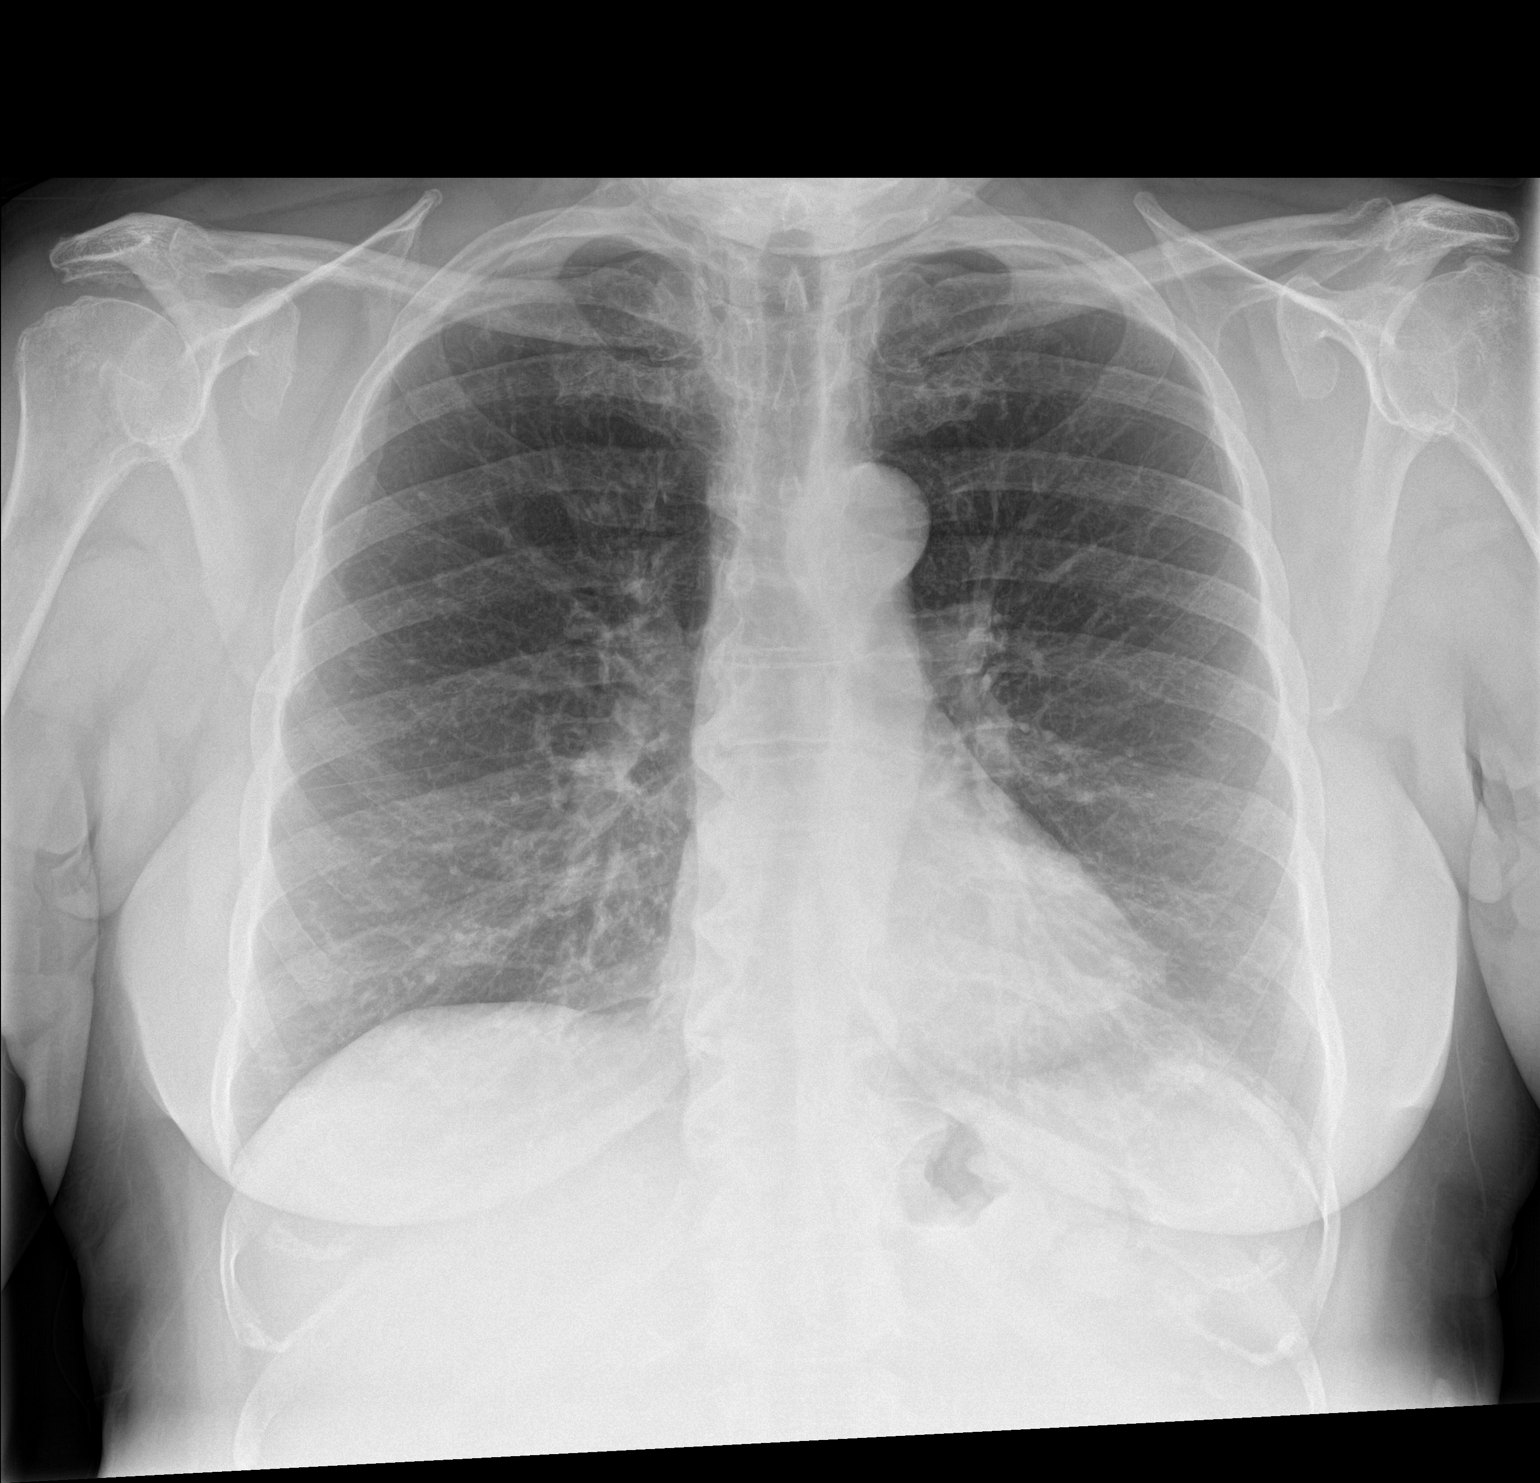

[chest lat]
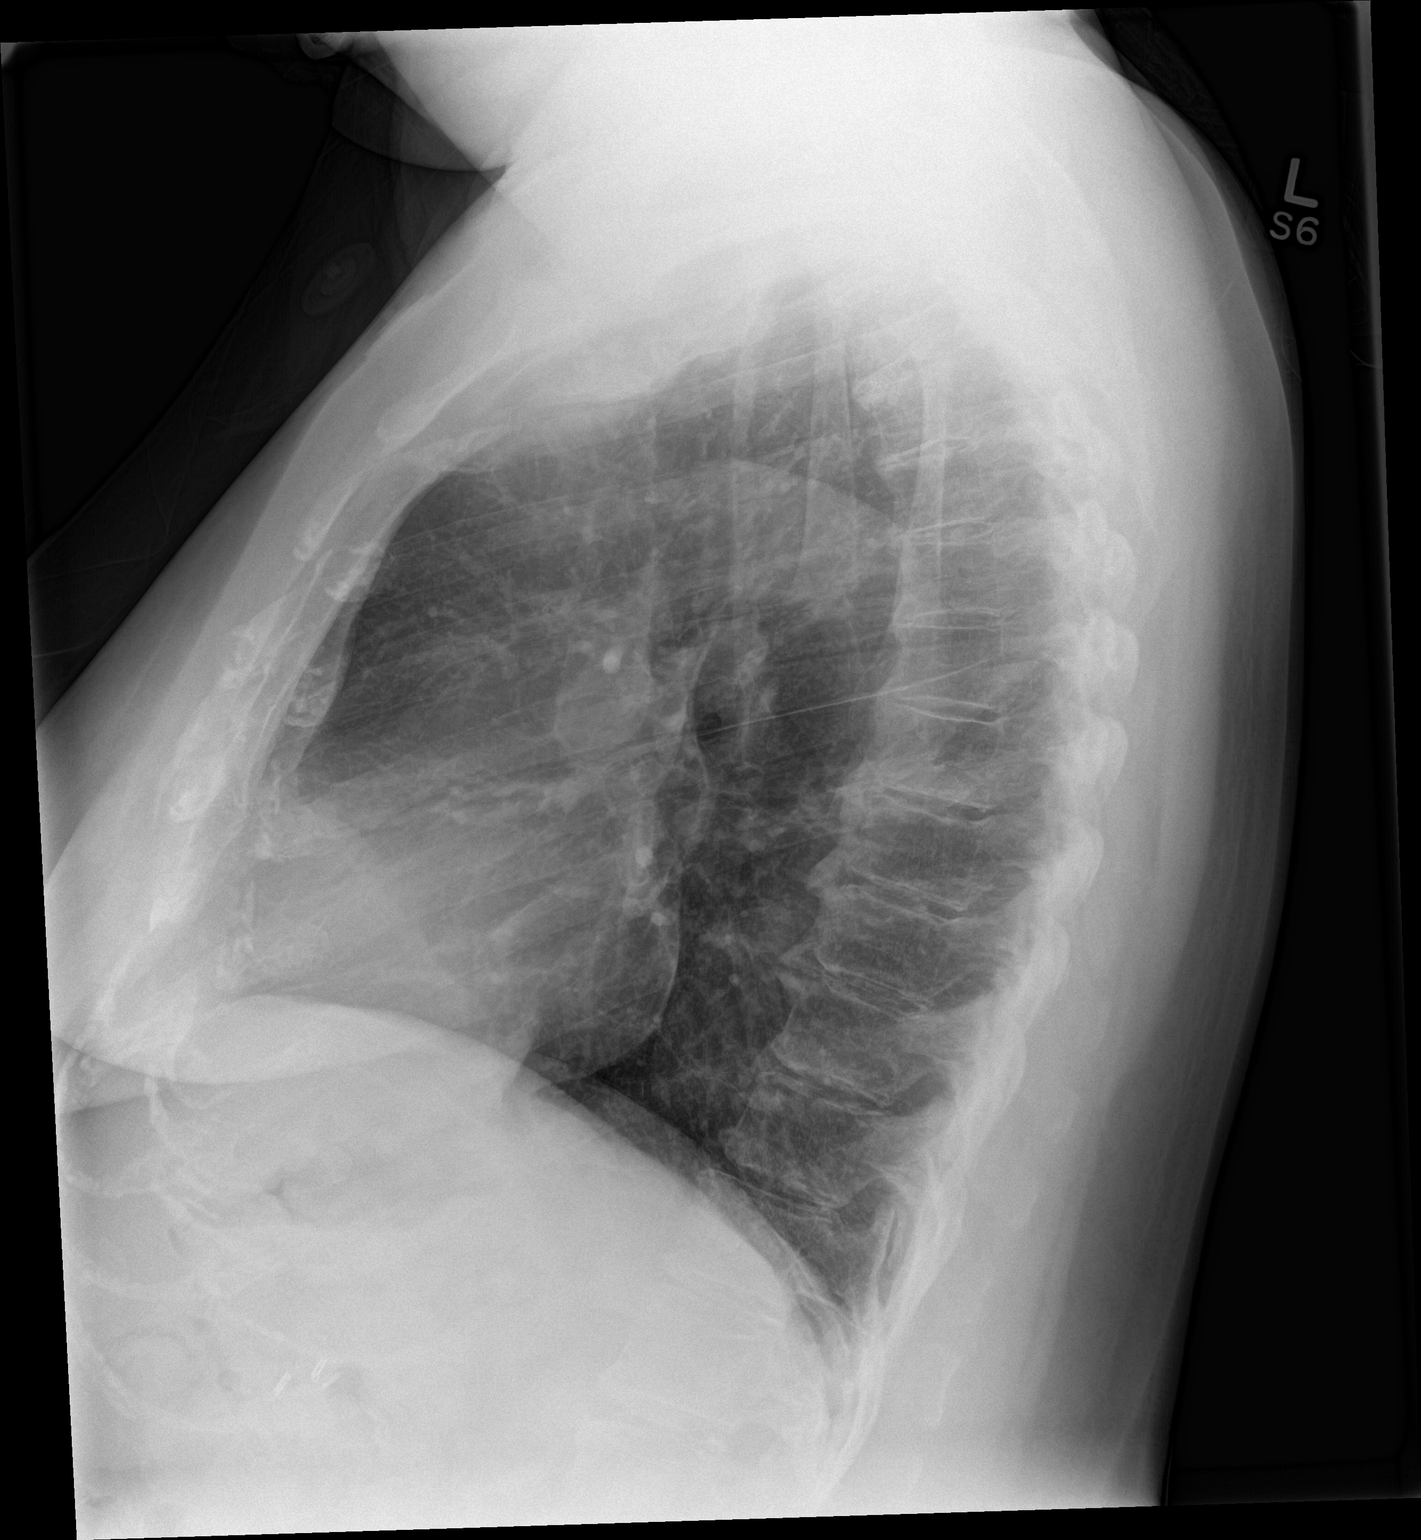

[2 of 2 positions shown; findings below may reference images not displayed]

FINDINGS: Cardiac silhouette normal in size, unchanged. Thoracic aorta mildly
tortuous and atherosclerotic, unchanged. Hilar and mediastinal
contours otherwise unremarkable. Prominent bronchovascular markings
diffusely and moderate central peribronchial thickening, unchanged.
Lungs otherwise clear. No localized airspace consolidation. No
pleural effusions. No pneumothorax. Normal pulmonary vascularity.
Degenerative changes and DISH involving the thoracic spine. No
significant interval change.
IMPRESSION: Stable mild changes of chronic bronchitis and/or asthma. No acute
cardiopulmonary disease.

## 2016-10-25 ENCOUNTER — Encounter: Payer: Self-pay | Admitting: Family Medicine

## 2016-10-25 ENCOUNTER — Ambulatory Visit (INDEPENDENT_AMBULATORY_CARE_PROVIDER_SITE_OTHER): Payer: Medicare Other | Admitting: Family Medicine

## 2016-10-25 VITALS — BP 116/74 | HR 76 | Temp 97.8°F | Resp 14 | Ht 66.0 in | Wt 212.0 lb

## 2016-10-25 DIAGNOSIS — L603 Nail dystrophy: Secondary | ICD-10-CM | POA: Diagnosis not present

## 2016-10-25 NOTE — Progress Notes (Signed)
Subjective:    Patient ID: Brooke Schneider, female    DOB: 06/10/1950, 66 y.o.   MRN: [id number]  HPI 2016, the patient suffered an injury to her right thumbnail while at a nail salon. The acrylic were being removed from her fingernails when a portion of the nail itself tore down into the underlying nail bed.  The injury to the nail grew rapidly out from the proximal cuticle to the very tip of the thumb. This occurred over 6 months. Afterwards it completely stalled. Now the very end of the thumb nail is thick yellow and dystrophic demonstrating on echo lysis from the underlying nail bed and opaque yellow-green discoloration to the nail itself Past Medical History:  Diagnosis Date  . Allergic rhinitis   . Asthma   . Depression   . Goiter   . Goiter   . ONGEXBMW(413.2Headache(784.0)    Past Surgical History:  Procedure Laterality Date  . ABDOMINAL HYSTERECTOMY    . CHOLECYSTECTOMY N/A 11/02/2013   Procedure: LAPAROSCOPIC CHOLECYSTECTOMY ;  Surgeon: Cherylynn RidgesJames O Wyatt, MD;  [facility];  Service: General;  Laterality: N/A;  . KNEE SURGERY    . THYROIDECTOMY     Current Outpatient Prescriptions on File Prior to Visit  Medication Sig Dispense Refill  . budesonide-formoterol (SYMBICORT) 80-4.5 MCG/ACT inhaler Inhale 1 puff into the lungs 2 (two) times daily. 3 Inhaler 3  . buPROPion (WELLBUTRIN XL) 150 MG 24 hr tablet TAKE 1 TABLET DAILY 90 tablet 3  . Calcium Carbonate-Vitamin D (CALCIUM 600+D) 600-400 MG-UNIT per tablet Take 1 tablet by mouth 2 (two) times daily.     Marland Kitchen. dicyclomine (BENTYL) 20 MG tablet Take 1 tablet (20 mg total) by mouth 4 (four) times daily -  before meals and at bedtime. 360 tablet 3  . DIVIGEL 0.25 MG/0.25GM GEL Apply 1 application topically daily. Thigh area    . levothyroxine (SYNTHROID, LEVOTHROID) 125 MCG tablet Take 1 tablet (125 mcg total) by mouth daily. 90 tablet 1  . montelukast (SINGULAIR) 10 MG tablet Take 1 tablet (10 mg total) by mouth at bedtime. 90 tablet 3  .  Multiple Vitamins-Minerals (MULTIVITAMIN WITH MINERALS) tablet Take 1 tablet by mouth daily.    Marland Kitchen. omeprazole (PRILOSEC) 20 MG capsule TAKE 1 CAPSULE DAILY 90 capsule 3  . PROAIR HFA 108 (90 BASE) MCG/ACT inhaler INHALE 2 PUFFS INTO THE LUNGS EVERY 6 HOURS AS NEEDED FOR WHEEZING 7 each 3  . propranolol ER (INDERAL LA) 60 MG 24 hr capsule TAKE 1 CAPSULE DAILY 90 capsule 3  . rizatriptan (MAXALT) 10 MG tablet Take 1 tablet (10 mg total) by mouth as needed for migraine. Reported on 08/12/2015 18 tablet 2  . topiramate (TOPAMAX) 50 MG tablet TAKE 1 TABLET (50 MG) IN THE MORNING AND 2 TABLETS (100 MG) AT BEDTIME 270 tablet 3  . zolpidem (AMBIEN) 10 MG tablet Take 1 tablet (10 mg total) by mouth at bedtime as needed for sleep. 90 tablet 1   No current facility-administered medications on file prior to visit.    Allergies  Allergen Reactions  . Sulfamethoxazole-Trimethoprim Swelling  . Metronidazole Itching and Rash   Social History   Social History  . Marital status: Married    Spouse name: Brooke Schneider  . Number of children: 2  . Years of education: college   Occupational History  . housewife    Social History Main Topics  . Smoking status: Passive Smoke Exposure - Never Smoker  . Smokeless tobacco: Never Used  Comment: husband smoking cigars  . Alcohol use Yes     Comment: occ  . Drug use: No  . Sexual activity: Not Currently   Other Topics Concern  . Not on file   Social History Narrative   Patient lives at home with her husband Brooke Diener).   Retired.   Education college B.S.   Right handed.   Caffeine one cup of coffee daily.      Review of Systems  All other systems reviewed and are negative.      Objective:   Physical Exam  Cardiovascular: Normal rate, regular rhythm and normal heart sounds.   Pulmonary/Chest: Effort normal and breath sounds normal. No respiratory distress. She has no wheezes. She has no rales.  Skin: No rash noted.  Vitals reviewed.  Right thumbnail  as describe in HPI       Assessment & Plan:  Dystrophic nail - Plan: Fungus culture w smear  I believe the patient likely has acquired onychomycosis. She would like to perform a fungal culture prior to undergoing Lamisil treatment due to the potential liver toxicity. Therefore I removed a large portion of the affected fingernail and sent for fungal culture using a pair of clippers.

## 2016-11-18 ENCOUNTER — Other Ambulatory Visit: Payer: Self-pay | Admitting: *Deleted

## 2016-11-18 MED ORDER — LEVOTHYROXINE SODIUM 125 MCG PO TABS: 125.0000 ug | ORAL_TABLET | Freq: Every day | ORAL | 1 refills | Status: DC

## 2016-11-24 LAB — CULT, FUNGUS, SKIN,HAIR,NAIL W/KOH

## 2016-12-15 LAB — FUNGUS CULTURE W SMEAR

## 2016-12-21 ENCOUNTER — Ambulatory Visit (INDEPENDENT_AMBULATORY_CARE_PROVIDER_SITE_OTHER): Payer: Medicare Other | Admitting: Family Medicine

## 2016-12-21 ENCOUNTER — Encounter: Payer: Self-pay | Admitting: Family Medicine

## 2016-12-21 VITALS — BP 112/78 | HR 68 | Temp 98.3°F | Resp 14 | Ht 66.0 in | Wt 210.0 lb

## 2016-12-21 DIAGNOSIS — J3089 Other allergic rhinitis: Secondary | ICD-10-CM | POA: Diagnosis not present

## 2016-12-21 DIAGNOSIS — J4540 Moderate persistent asthma, uncomplicated: Secondary | ICD-10-CM | POA: Diagnosis not present

## 2016-12-21 DIAGNOSIS — H1013 Acute atopic conjunctivitis, bilateral: Secondary | ICD-10-CM

## 2016-12-21 MED ORDER — PREDNISONE 10 MG PO TABS: ORAL_TABLET | ORAL | 0 refills | Status: DC

## 2016-12-21 NOTE — Assessment & Plan Note (Signed)
She has multiple environmental allergens which trigger her asthma and random rashes. Sometimes very difficult to follow her story is not sure about this Christmas tree that she states there is still some branches from in her front yard. At any rate she states she's had coughing and wheezing episodes in the random episodes of urticaria this does typically resolve when she has prednisone for Will give her prednisone taper. Amanda set her up with an allergy and asthma specialist again so we can get a better idea what she is actually allergic to what she has her spells. She will continue her current list of medications which does include Singulair and Claritin until further recommendations from the allergist.

## 2016-12-21 NOTE — Patient Instructions (Addendum)
Referral to allergy specialist  Prednisone sent to pharmacy  F/U AS NEEDED

## 2016-12-21 NOTE — Progress Notes (Signed)
   Subjective:    Patient ID: Brooke Schneider, female    DOB: 02/21/1951, 66 y.o.   MRN: [id number]  Patient presents for Seasonal Allergies (watery eyes) and Itching (generalized itching)  She reports recurrent allergic reactions and allergies. She has been treated with multiple antihistamines also has allergic induced asthma which she is on Symbicort for which she is doing well with that regards except for when she is around tobacco smoke from her husband. She is unclear to what she things works a little better than the Zyrtec but still has itching episodes on her arms that she will break out in hives and then they will N believe. She thinks this is due to the mosquitoes and some left over Christmas tree droppings that she states is in her front yard?. She also has allergies to cats and dust mites. She last saw an allergist years back. The itching and intermittent hives as been going on for the past couple weeks.    Review Of Systems:  GEN- denies fatigue, fever, weight loss,weakness, recent illness HEENT- denies eye drainage, change in vision, nasal discharge, CVS- denies chest pain, palpitations RESP- denies SOB, +cough, +wheeze ABD- denies N/V, change in stools, abd pain GU- denies dysuria, hematuria, dribbling, incontinence MSK- denies joint pain, muscle aches, injury Neuro- denies headache, dizziness, syncope, seizure activity       Objective:    BP 112/78   Pulse 68   Temp 98.3 F (36.8 C) (Oral)   Resp 14   Ht 5\' 6"  (1.676 m)   Wt 210 lb (95.3 kg)   SpO2 99%   BMI 33.89 kg/m  GEN- NAD, alert and oriented x3 HEENT- PERRL, EOMI, non injected sclera, pink conjunctiva, MMM, oropharynx clear Neck- Supple, no LAD CVS- RRR, no murmur RESP-CTAB Skin- mild erythematous patches on arms+excoriations, no hives/urticaria noted EXT- No edema Pulses- Radial, 2+        Assessment & Plan:      Problem List Items Addressed This Visit    Allergic conjunctivitis   Asthma,  allergic - Primary    She has multiple environmental allergens which trigger her asthma and random rashes. Sometimes very difficult to follow her story is not sure about this Christmas tree that she states there is still some branches from in her front yard. At any rate she states she's had coughing and wheezing episodes in the random episodes of urticaria this does typically resolve when she has prednisone for Will give her prednisone taper. Amanda set her up with an allergy and asthma specialist again so we can get a better idea what she is actually allergic to what she has her spells. She will continue her current list of medications which does include Singulair and Claritin until further recommendations from the allergist.      Relevant Medications   predniSONE (DELTASONE) 10 MG tablet    Other Visit Diagnoses    Environmental and seasonal allergies          Note: This dictation was prepared with Dragon dictation along with smaller phrase technology. Any transcriptional errors that result from this process are unintentional.

## 2017-02-01 NOTE — Progress Notes (Signed)
[facility]  PATIENT: Brooke Schneider DOB: 1951/03/19   REASON FOR VISIT: Follow-up for headaches HISTORY FROM: Patient    HISTORY OF PRESENT ILLNESS:Brooke Schneider, 66 year old female returns for followup.  She has a history of headaches since June 2012 starting at the mid left parietal area going to the frontal area and then will spread across the whole frontal area sometimes she will have pain on her ear and down the left side of her neck. The pain was initially like a sharp pain which would last for 2-3 hours, she was taking Maxalt with relief.  She is also taking Topamax twice daily. She did have some mild blurred vision and noise discomfort with these headaches initially. Denies photophobia, nausea, vomiting or dizziness.  Headaches occur about once  every  week and some weeks not any. Her Topamax is currently 50 mg in the morning and 100 at night. She feels that her current regimen is working well and that the headaches that she has are mild. She has retired from Engineer, manufacturing. She has a mother living who has memory loss and she says this is stressful for her. She returns for reevaluation. She needs refills on her medication . She is due to have allergy testing.   REVIEW OF SYSTEMS: Full 14 system review of systems performed and notable only for those listed, all others are neg:  Constitutional: Fatigue Cardiovascular: neg Ear/Nose/Throat: neg  Skin: neg Eyes: neg Respiratory: Shortness of breath Gastroitestinal:  Hematology/Lymphatic: neg  Endocrine: neg Musculoskeletal:neg Allergy/Immunology: environmental allergies Neurological: Headaches Psychiatric:  Sleep : neg   ALLERGIES: Allergies  Allergen Reactions  . Sulfamethoxazole-Trimethoprim Swelling  . Metronidazole Itching and Rash    HOME MEDICATIONS: Outpatient Medications Prior to Visit  Medication Sig Dispense Refill  . budesonide-formoterol (SYMBICORT) 80-4.5 MCG/ACT inhaler  Inhale 1 puff into the lungs 2 (two) times daily. 3 Inhaler 3  . buPROPion (WELLBUTRIN XL) 150 MG 24 hr tablet TAKE 1 TABLET DAILY 90 tablet 3  . Calcium Carbonate-Vitamin D (CALCIUM 600+D) 600-400 MG-UNIT per tablet Take 1 tablet by mouth 2 (two) times daily.     Marland Kitchen dicyclomine (BENTYL) 20 MG tablet Take 1 tablet (20 mg total) by mouth 4 (four) times daily -  before meals and at bedtime. 360 tablet 3  . DIVIGEL 0.25 MG/0.25GM GEL Apply 1 application topically daily. Thigh area    . levothyroxine (SYNTHROID, LEVOTHROID) 125 MCG tablet Take 1 tablet (125 mcg total) by mouth daily. 90 tablet 1  . loratadine (CLARITIN) 10 MG tablet Take 10 mg by mouth daily.    . montelukast (SINGULAIR) 10 MG tablet Take 1 tablet (10 mg total) by mouth at bedtime. 90 tablet 3  . Multiple Vitamins-Minerals (MULTIVITAMIN WITH MINERALS) tablet Take 1 tablet by mouth daily.    Marland Kitchen omeprazole (PRILOSEC) 20 MG capsule TAKE 1 CAPSULE DAILY 90 capsule 3  . PROAIR HFA 108 (90 BASE) MCG/ACT inhaler INHALE 2 PUFFS INTO THE LUNGS EVERY 6 HOURS AS NEEDED FOR WHEEZING 7 each 3  . propranolol ER (INDERAL LA) 60 MG 24 hr capsule TAKE 1 CAPSULE DAILY 90 capsule 3  . rizatriptan (MAXALT) 10 MG tablet Take 1 tablet (10 mg total) by mouth as needed for migraine. Reported on 08/12/2015 18 tablet 2  . topiramate (TOPAMAX) 50 MG tablet TAKE 1 TABLET (50 MG) IN THE MORNING AND 2 TABLETS (100 MG) AT BEDTIME 270 tablet 3  . predniSONE (DELTASONE) 10 MG tablet Take 40mg  x 3 days,20mg  x  3 days, 10mg  x 3 days (Patient not taking: Reported on 02/02/2017) 21 tablet 0  . zolpidem (AMBIEN) 10 MG tablet Take 1 tablet (10 mg total) by mouth at bedtime as needed for sleep. 90 tablet 1   No facility-administered medications prior to visit.     PAST MEDICAL HISTORY: Past Medical History:  Diagnosis Date  . Allergic rhinitis   . Asthma   . Depression   . Goiter   . Goiter   . Headache(784.0)     PAST SURGICAL HISTORY: Past Surgical History:    Procedure Laterality Date  . ABDOMINAL HYSTERECTOMY    . CHOLECYSTECTOMY N/A 11/02/2013   Procedure: LAPAROSCOPIC CHOLECYSTECTOMY ;  Surgeon: Cherylynn Ridges, MD;  Location: [facility] OR;  Service: General;  Laterality: N/A;  . KNEE SURGERY    . THYROIDECTOMY      FAMILY HISTORY: Family History  Problem Relation Age of Onset  . Allergies Mother   . ALS Father   . Prostate cancer Father     SOCIAL HISTORY: Social History   Social History  . Marital status: Married    Spouse name: [name]  . Number of children: 2  . Years of education: college   Occupational History  . housewife    Social History Main Topics  . Smoking status: Passive Smoke Exposure - Never Smoker  . Smokeless tobacco: Never Used     Comment: husband smoking cigars  . Alcohol use Yes     Comment: occ  . Drug use: No  . Sexual activity: Not Currently   Other Topics Concern  . Not on file   Social History Narrative   Patient lives at home with her husband [name]).   Retired.   Education college B.S.   Right handed.   Caffeine one cup of coffee daily.     PHYSICAL EXAM  Vitals:   02/02/17 1520  BP: 122/81  Pulse: 81  Weight: 215 lb 6.4 oz (97.7 kg)  Height: 5\' 6"  (1.676 m)   Body mass index is 34.77 kg/m. General: well developed, well nourished, seated, in no evident distress  Head: head normocephalic and atraumatic. Oropharynx benign  Neck: supple with no carotid bruits  Cardiovascular: regular rate and rhythm, no murmurs  Neurologic Exam  Mental Status: Awake and fully alert. Oriented to place and time. Follows all commands. Speech and language normal.  Cranial Nerves: Pupils equal, briskly reactive to light. Extraocular movements full without nystagmus. Visual fields full to confrontation. Hearing intact and symmetric to finger snap. Facial sensation intact. Face, tongue, palate move normally and symmetrically. Neck flexion and extension normal.  Motor: Normal bulk and tone. Normal strength  in all tested extremity muscles.No focal weakness  Coordination: Rapid alternating movements normal in all extremities. Finger-to-nose and heel-to-shin performed accurately bilaterally.  Gait and Station: Arises from chair without difficulty. Stance is normal. . Able to heel, toe and tandem walk without difficulty.  Reflexes:1+ and symmetric. Toes downgoing.  DIAGNOSTIC DATA (LABS, IMAGING, TESTING) - I reviewed patient records, labs, notes, testing and imaging myself where available.  Lab Results  Component Value Date   WBC 4.8 09/09/2016   HGB 13.3 09/09/2016   HCT 40.3 09/09/2016   MCV 92.4 09/09/2016   PLT 332 09/09/2016      Component Value Date/Time   NA 140 09/09/2016 1020   K 4.0 09/09/2016 1020   CL 109 09/09/2016 1020   CO2 19 (L) 09/09/2016 1020   GLUCOSE 94 09/09/2016 1020   BUN  9 09/09/2016 1020   CREATININE 0.96 09/09/2016 1020   CALCIUM 9.0 09/09/2016 1020   PROT 7.0 09/09/2016 1020   ALBUMIN 3.9 09/09/2016 1020   AST 13 09/09/2016 1020   ALT 14 09/09/2016 1020   ALKPHOS 51 09/09/2016 1020   BILITOT 0.3 09/09/2016 1020   GFRNONAA 62 09/09/2016 1020   GFRAA 72 09/09/2016 1020    ASSESSMENT AND PLAN  66 y.o. year old female  has a past medical history of Asthma; Allergic rhinitis; Depression;  and Headache(784.0). here to follow-up for her headaches.     Continue Topamax 50 mg in the am and 100mg  in the pm, will refill Continue Maxalt 10 mg when necessary acute migraine will refill Followup yearly and when necessary Call for increase in headaches Nilda Riggs, [facility], [facility], APRN  [facility] [address], [address] [place], [place] [address] [phone]

## 2017-02-02 ENCOUNTER — Ambulatory Visit (INDEPENDENT_AMBULATORY_CARE_PROVIDER_SITE_OTHER): Payer: Medicare Other | Admitting: Nurse Practitioner

## 2017-02-02 ENCOUNTER — Encounter: Payer: Self-pay | Admitting: Nurse Practitioner

## 2017-02-02 VITALS — BP 122/81 | HR 81 | Ht 66.0 in | Wt 215.4 lb

## 2017-02-02 DIAGNOSIS — G43909 Migraine, unspecified, not intractable, without status migrainosus: Secondary | ICD-10-CM | POA: Diagnosis not present

## 2017-02-02 MED ORDER — TOPIRAMATE 50 MG PO TABS: ORAL_TABLET | ORAL | 3 refills | Status: DC

## 2017-02-02 MED ORDER — RIZATRIPTAN BENZOATE 10 MG PO TABS: 10.0000 mg | ORAL_TABLET | ORAL | 2 refills | Status: DC | PRN

## 2017-02-02 NOTE — Patient Instructions (Addendum)
Continue Topamax 50 mg in the am and 100mg  in the pm, will refill Continue Maxalt 10 mg when necessary acute migraine will refill Followup yearly and when necessary Call for increase in headaches

## 2017-02-04 DIAGNOSIS — N760 Acute vaginitis: Secondary | ICD-10-CM | POA: Diagnosis not present

## 2017-02-04 DIAGNOSIS — Z113 Encounter for screening for infections with a predominantly sexual mode of transmission: Secondary | ICD-10-CM | POA: Diagnosis not present

## 2017-02-10 NOTE — Progress Notes (Signed)
I have reviewed and agreed above plan. 

## 2017-02-18 ENCOUNTER — Ambulatory Visit (INDEPENDENT_AMBULATORY_CARE_PROVIDER_SITE_OTHER): Payer: Medicare Other | Admitting: Family Medicine

## 2017-02-18 ENCOUNTER — Encounter: Payer: Self-pay | Admitting: Family Medicine

## 2017-02-18 VITALS — BP 110/70 | HR 81 | Temp 98.1°F | Resp 16 | Ht 66.0 in | Wt 213.2 lb

## 2017-02-18 DIAGNOSIS — J3089 Other allergic rhinitis: Secondary | ICD-10-CM

## 2017-02-18 DIAGNOSIS — J4540 Moderate persistent asthma, uncomplicated: Secondary | ICD-10-CM

## 2017-02-18 DIAGNOSIS — L509 Urticaria, unspecified: Secondary | ICD-10-CM | POA: Diagnosis not present

## 2017-02-18 MED ORDER — HYDROXYZINE HCL 10 MG PO TABS: 10.0000 mg | ORAL_TABLET | Freq: Three times a day (TID) | ORAL | 2 refills | Status: DC | PRN

## 2017-02-18 NOTE — Assessment & Plan Note (Signed)
Controlled with the Symbicort but she does use the albuterol daily she feels like this opens her up more for her Symbicort

## 2017-02-18 NOTE — Patient Instructions (Addendum)
Try the atarax for itching Referral to allergy  F/U 4 months

## 2017-02-18 NOTE — Progress Notes (Signed)
   Subjective:    Patient ID: Brooke Schneider, female    DOB: [birth year], 66 y.o.   MRN: [id number]  Patient presents for Rash (on arms, legs and ankles)  Patient here with intermittent hives. We have discussed this before. She still has not had her allergy appointment. She thinks it is the trees outside and other environmental things that make her break out. She will get itchy red bumps on her forearms or back or legs then they go away and come back. She uses Benadryl and this helps. Her breathing has been okay she is using her Symbicort albuterol she is still taking her Claritin and her Singulair She is not on any new medications. We also discuss her recent visits to the neurologist. No changes to her migraine medication  Declines flu shot   Review Of Systems:  GEN- denies fatigue, fever, weight loss,weakness, recent illness HEENT- denies eye drainage, change in vision, nasal discharge, CVS- denies chest pain, palpitations RESP- denies SOB, cough, wheeze ABD- denies N/V, change in stools, abd pain GU- denies dysuria, hematuria, dribbling, incontinence MSK- denies joint pain, muscle aches, injury Neuro- denies headache, dizziness, syncope, seizure activity       Objective:    BP 110/70   Pulse 81   Temp 98.1 F (36.7 C) (Oral)   Resp 16   Ht  (1.676 m)   Wt 213 lb 3.2 oz (96.7 kg)   SpO2 93%   BMI 34.41 kg/m  GEN- NAD, alert and oriented x3 HEENT- PERRL, EOMI, non injected sclera, pink conjunctiva, MMM, oropharynx clear CVS- RRR, no murmur RESP-CTAB Skin- 8 erythematous fine maculopapular lesions on her forearm to noted on her right one on her left. No lesions noted on her back or her legs EXT- No edema Pulses- Radial2+        Assessment & Plan:      Problem List Items Addressed This Visit      Unprioritized   Allergic rhinitis   Relevant Orders   Ambulatory referral to Allergy   Asthma, allergic    Controlled with the Symbicort but she does use the  albuterol daily she feels like this opens her up more for her Symbicort      Relevant Orders   Ambulatory referral to Allergy    Other Visit Diagnoses    Hives    -  Primary   Recurrent hives she has known environmental allergens. I put a nonurgent referral today for allergy to get her testing done. I've given her Hydroxyzine to use. I do not see a need for prednisone at this time    Relevant Orders   Ambulatory referral to Allergy      Note: This dictation was prepared with Dragon dictation along with smaller phrase technology. Any transcriptional errors that result from this process are unintentional.

## 2017-03-04 ENCOUNTER — Telehealth: Payer: Self-pay | Admitting: Family Medicine

## 2017-03-04 NOTE — Telephone Encounter (Signed)
Patient is requesting to be excused from jury duty I have put the form in your folder to see if it can be done today she is scheduled for next Tuesday  [phone] orn [phone]

## 2017-03-04 NOTE — Telephone Encounter (Signed)
I spoke with patient and she states she is not able to stay on task for long periods of time, patient feels she is not able to keep up with group discussions which is why she is asking to be excused from jury duty  Pls advise

## 2017-03-06 ENCOUNTER — Encounter: Payer: Self-pay | Admitting: Family Medicine

## 2017-03-07 NOTE — Telephone Encounter (Signed)
Note is ready for p/u, left up front and pt aware via vm on both phone numbers left in this message.

## 2017-03-14 ENCOUNTER — Other Ambulatory Visit: Payer: Self-pay | Admitting: Family Medicine

## 2017-03-14 NOTE — Telephone Encounter (Signed)
Okay to refill? 

## 2017-03-14 NOTE — Telephone Encounter (Signed)
Ok to refill??  Last office visit 02/18/2017.  Last refill 09/13/2016, #1 refill.

## 2017-03-15 NOTE — Telephone Encounter (Signed)
Medication called to pharmacy. 

## 2017-03-29 ENCOUNTER — Ambulatory Visit (INDEPENDENT_AMBULATORY_CARE_PROVIDER_SITE_OTHER): Payer: Medicare Other | Admitting: Allergy and Immunology

## 2017-03-29 ENCOUNTER — Encounter: Payer: Self-pay | Admitting: Allergy and Immunology

## 2017-03-29 VITALS — BP 120/74 | HR 66 | Resp 17 | Ht 66.0 in | Wt 210.8 lb

## 2017-03-29 DIAGNOSIS — J4550 Severe persistent asthma, uncomplicated: Secondary | ICD-10-CM

## 2017-03-29 DIAGNOSIS — K219 Gastro-esophageal reflux disease without esophagitis: Secondary | ICD-10-CM | POA: Diagnosis not present

## 2017-03-29 DIAGNOSIS — D721 Eosinophilia, unspecified: Secondary | ICD-10-CM

## 2017-03-29 DIAGNOSIS — H1013 Acute atopic conjunctivitis, bilateral: Secondary | ICD-10-CM

## 2017-03-29 DIAGNOSIS — J3089 Other allergic rhinitis: Secondary | ICD-10-CM | POA: Diagnosis not present

## 2017-03-29 DIAGNOSIS — H1045 Other chronic allergic conjunctivitis: Secondary | ICD-10-CM

## 2017-03-29 MED ORDER — MONTELUKAST SODIUM 10 MG PO TABS: 10.0000 mg | ORAL_TABLET | Freq: Every day | ORAL | 5 refills | Status: DC

## 2017-03-29 MED ORDER — OLOPATADINE HCL 0.2 % OP SOLN: 1.0000 [drp] | Freq: Every day | OPHTHALMIC | 5 refills | Status: DC | PRN

## 2017-03-29 MED ORDER — ALBUTEROL SULFATE HFA 108 (90 BASE) MCG/ACT IN AERS: 2.0000 | INHALATION_SPRAY | RESPIRATORY_TRACT | 2 refills | Status: DC | PRN

## 2017-03-29 MED ORDER — BUDESONIDE-FORMOTEROL FUMARATE 160-4.5 MCG/ACT IN AERO: 2.0000 | INHALATION_SPRAY | Freq: Two times a day (BID) | RESPIRATORY_TRACT | 5 refills | Status: DC

## 2017-03-29 MED ORDER — RANITIDINE HCL 300 MG PO CAPS: 300.0000 mg | ORAL_CAPSULE | Freq: Every evening | ORAL | 5 refills | Status: DC

## 2017-03-29 NOTE — Patient Instructions (Addendum)
  1. Allergen avoidance measures  2. Treat and prevent inflammation:   A. Symbicort 160 - 2 inhalations twice a day with spacer. "Empty lungs"  B. OTC Nasacort - one spray each nostril one time per day  C. Singulair 10 mg - one tablet one time per day  3. Treat and prevent reflux:   A. slowly consolidate all caffeine and chocolate consumption  B. increase omeprazole to 40 mg in a.m.  C. start ranitidine 300 mg in PM  4. If needed:   A. cetirizine 10 mg - one tablet 1-2 times a day  B. ProAir HFA or similar - 2 inhalations every 4-6 hours  C. Pataday - one drop each eye one time per day  5. Return to clinic in 4 weeks or earlier if problem  6. Obtain fall flu vaccine

## 2017-03-29 NOTE — Progress Notes (Signed)
Dear Dr. Jeanice Lim,  Thank you for referring Brooke Schneider to the [facility] [place] on 03/29/2017.   Below is a summation of this patient's evaluation and recommendations.  Thank you for your referral. I will keep you informed about this patient's response to treatment.   If you have any questions please do not hesitate to contact me.   Sincerely,  Jessica Priest, MD Allergy / Immunology  [facility]   ______________________________________________________________________    NEW PATIENT NOTE  Referring Provider: Salley Scarlet, MD Primary Provider: Salley Scarlet, MD Date of office visit: 03/29/2017    Subjective:   Chief Complaint:  Brooke Schneider (DOB: 02-04-1951) is a 66 y.o. female who presents to the clinic on 03/29/2017 with a chief complaint of Allergies (C/O rashes and bumps on her arms, legs, and back. ) .     HPI: Tamika presents to this clinic in evaluation of several issues.  First, for several months she has been having red raised itchy lesions across her body without any associated systemic or constitutional symptoms that lasts approximately one day or less and never heal with scar or hyperpigmentation and without any obvious trigger. She has been treated with systemic steroids and has been treated with antihistamines. Overall she is better regarding this issue and even though she no longer uses any antihistamines recently she believes that there has been improvement regarding this issue over the course of the past week or so.  Second, she has long-standing issues with nasal congestion and sneezing and itchy red watery eyes occurring on a perennial basis with flare during the spring and fall especially following exposure to cats and dogs and pollens and dust and occasionally mold. She has been treated with Singulair which she thinks does help this issue. She intermittently  uses a nasal steroid which may or may not help. There is no associated anosmia or ugly nasal discharge for headaches although she does have a history of migraines treated with Topamax.  Third, she has a long history of intermittent wheezing and coughing and chest tightness occurring on a perennial basis with flare during the spring and fall. She does not have a significant exercise induced component but she does have a cold air induced component. Provoking factors for her symptoms include exposure to cats and dogs and pollen and dust and mold. She has been using Symbicort which she thinks does help but she still needs to use a bronchodilator multiple times a day for relief. She is not sure if she has received a systemic steroid to control this issue in the past year.  Fourth, she has constant throat clearing and drainage in her throat and intermittent raspy voice. She does have GERD for which she uses Prilosec. She still occasionally has intermittent regurgitation. She drinks 2 Cokes per day and occasionally tea and occasionally chocolate about once every week and does not consume any alcohol.  Past Medical History:  Diagnosis Date  . Allergic rhinitis   . Asthma   . Depression   . Goiter   . Goiter   . ZOXWRUEA(540.9)     Past Surgical History:  Procedure Laterality Date  . ABDOMINAL HYSTERECTOMY    . CHOLECYSTECTOMY N/A 11/02/2013   Procedure: LAPAROSCOPIC CHOLECYSTECTOMY ;  Surgeon: Cherylynn Ridges, MD;  Location: [facility] OR;  Service: General;  Laterality: N/A;  . KNEE SURGERY    . THYROIDECTOMY  Allergies as of 03/29/2017      Reactions   Sulfamethoxazole-trimethoprim Swelling   Metronidazole Itching, Rash      Medication List      budesonide-formoterol 80-4.5 MCG/ACT inhaler Commonly known as:  SYMBICORT Inhale 1 puff into the lungs 2 (two) times daily.   buPROPion 150 MG 24 hr tablet Commonly known as:  WELLBUTRIN XL TAKE 1 TABLET DAILY   CALCIUM 600+D 600-400 MG-UNIT  tablet Generic drug:  Calcium Carbonate-Vitamin D Take 1 tablet by mouth 2 (two) times daily.   dicyclomine 20 MG tablet Commonly known as:  BENTYL Take 1 tablet (20 mg total) by mouth 4 (four) times daily -  before meals and at bedtime.   DIVIGEL 0.25 MG/0.25GM Gel Generic drug:  Estradiol Apply 1 application topically daily. Thigh area   hydrOXYzine 10 MG tablet Commonly known as:  ATARAX/VISTARIL Take 1 tablet (10 mg total) by mouth 3 (three) times daily as needed.   levothyroxine 125 MCG tablet Commonly known as:  SYNTHROID, LEVOTHROID Take 1 tablet (125 mcg total) by mouth daily.   montelukast 10 MG tablet Commonly known as:  SINGULAIR Take 1 tablet (10 mg total) by mouth at bedtime.   multivitamin with minerals tablet Take 1 tablet by mouth daily.   omeprazole 20 MG capsule Commonly known as:  PRILOSEC TAKE 1 CAPSULE DAILY   PROAIR HFA 108 (90 Base) MCG/ACT inhaler Generic drug:  albuterol INHALE 2 PUFFS INTO THE LUNGS EVERY 6 HOURS AS NEEDED FOR WHEEZING   propranolol ER 60 MG 24 hr capsule Commonly known as:  INDERAL LA TAKE 1 CAPSULE DAILY   rizatriptan 10 MG tablet Commonly known as:  MAXALT Take 1 tablet (10 mg total) by mouth as needed for migraine. Reported on 08/12/2015   topiramate 50 MG tablet Commonly known as:  TOPAMAX TAKE 1 TABLET (50 MG) IN THE MORNING AND 2 TABLETS (100 MG) AT BEDTIME   zolpidem 10 MG tablet Commonly known as:  AMBIEN TAKE 1 TABLET AT BEDTIME AS NEEDED FOR SLEEP.       Review of systems negative except as noted in HPI / PMHx or noted below:  Review of Systems  Constitutional: Negative.   HENT: Negative.   Eyes: Negative.   Respiratory: Negative.   Cardiovascular: Negative.   Gastrointestinal: Negative.   Genitourinary: Negative.   Musculoskeletal: Negative.   Skin: Negative.   Neurological: Negative.   Endo/Heme/Allergies: Negative.   Psychiatric/Behavioral: Negative.     Family History  Problem Relation Age  of Onset  . Allergies Mother   . ALS Father   . Prostate cancer Father     Social History   Social History  . Marital status: Married    Spouse name: Hessie Dienerlan  . Number of children: 2  . Years of education: college   Occupational History  . housewife    Social History Main Topics  . Smoking status: Passive Smoke Exposure - Never Smoker  . Smokeless tobacco: Never Used     Comment: husband smoking cigars  . Alcohol use Yes     Comment: occ  . Drug use: No  . Sexual activity: Not Currently   Other Topics Concern  . Not on file   Social History Narrative   Patient lives at home with her husband [name](Alan).   Retired.   Education college B.S.   Right handed.   Caffeine one cup of coffee daily.    Environmental and Social history  Lives in a house with a  dry environment, no animals located inside the household, carpeting in the bedroom, plastic on the bed, plastic on the pillow, and occasional exposure to indoor tobacco smoke about once every 2 months for 3 days. She is a retired Runner, broadcasting/film/video.  Objective:   Vitals:   03/29/17 1343 03/29/17 1415  BP: 120/74   Pulse: 66   Resp: 17   SpO2: 96% 98%   Height: 5\' 6"  (167.6 cm) Weight: 210 lb 12.8 oz (95.6 kg)  Physical Exam  Constitutional: She is well-developed, well-nourished, and in no distress.  Slightly raspy voice, slight throat clearing  HENT:  Head: Normocephalic. Head is without right periorbital erythema and without left periorbital erythema.  Right Ear: Tympanic membrane, external ear and ear canal normal.  Left Ear: Tympanic membrane, external ear and ear canal normal.  Nose: Mucosal edema present. No rhinorrhea.  Mouth/Throat: Uvula is midline, oropharynx is clear and moist and mucous membranes are normal. No oropharyngeal exudate.  Eyes: Pupils are equal, round, and reactive to light. Conjunctivae and lids are normal.  Neck: Trachea normal. No tracheal tenderness present. No tracheal deviation present. No  thyromegaly present.  Cardiovascular: Normal rate, regular rhythm, S1 normal, S2 normal and normal heart sounds.   No murmur heard. Pulmonary/Chest: Effort normal and breath sounds normal. No stridor. No tachypnea. No respiratory distress. She has no wheezes. She has no rales. She exhibits no tenderness.  Abdominal: Soft. She exhibits no distension and no mass. There is no hepatosplenomegaly. There is no tenderness. There is no rebound and no guarding.  Musculoskeletal: She exhibits no edema or tenderness.  Lymphadenopathy:       Head (right side): No tonsillar adenopathy present.       Head (left side): No tonsillar adenopathy present.    She has no cervical adenopathy.    She has no axillary adenopathy.  Neurological: She is alert. Gait normal.  Skin: No rash noted. She is not diaphoretic. No erythema. No pallor. Nails show no clubbing.  Psychiatric: Mood and affect normal.    Diagnostics: Allergy skin tests were performed. She demonstrated hypersensitivity to trees, grasses, weeds, house dust mite, and cat.  Spirometry was performed and demonstrated an FEV1 of 2.16 @ 101 % of predicted. Following the administration of nebulized albuterol her FEV1 did not change significantly.  Results of blood tests obtained 09/09/2016 identified WBC 4.8, absolute eosinophil 240, absolute lymphocyte 2208, hemoglobin 13.3, platelet 332, normal hepatic and renal function, TSH 6.28 micro international units/L, T4 1.1 nG/DL  Results of a chest x-ray obtained 08/02/2015 identified the following:  Cardiac silhouette normal in size, unchanged. Thoracic aorta mildly tortuous and atherosclerotic, unchanged. Hilar and mediastinal contours otherwise unremarkable. Prominent bronchovascular markings diffusely and moderate central peribronchial thickening, unchanged. Lungs otherwise clear. No localized airspace consolidation. No pleural effusions. No pneumothorax. Normal pulmonary vascularity. Degenerative changes  and DISH involving the thoracic spine. No significant interval change.   Assessment and Plan:    1. Not well controlled severe persistent asthma   2. Other allergic rhinitis   3. Perennial allergic conjunctivitis of both eyes   4. LPRD (laryngopharyngeal reflux disease)   5. Eosinophilia     1. Allergen avoidance measures  2. Treat and prevent inflammation:   A. Symbicort 160 - 2 inhalations twice a day with spacer.  B. OTC Nasacort - one spray each nostril one time per day  C. Singulair 10 mg - one tablet one time per day  3. Treat and prevent reflux:   A. slowly  consolidate all caffeine and chocolate consumption  B. increase omeprazole to 40 mg in a.m.  C. start ranitidine 300 mg in PM  4. If needed:   A. cetirizine 10 mg - one tablet 1-2 times a day  B. ProAir HFA or similar - 2 inhalations every 4-6 hours  C. Pataday - one drop each eye one time per day  5. Return to clinic in 4 weeks or earlier if problem  6. Obtain fall flu vaccine  Zahlia appears to be quite atopic and I suspect that most of her respiratory tract issues and some of her chronic urticaria is probably tied up with this atopic condition and she will perform allergen avoidance measures as best as possible and use anti-inflammatory agents for her respiratory tract. In addition there appears to be a component of LPR for which she will utilize a combination of therapy as noted above to address this issue. I will see her back in this clinic in 4 weeks or earlier if there is a problem. Further evaluation and treatment will be based upon her response.  Jessica Priest, MD Allergy / Immunology [facility] [place]

## 2017-04-13 ENCOUNTER — Other Ambulatory Visit: Payer: Self-pay | Admitting: *Deleted

## 2017-04-13 MED ORDER — PAZEO 0.7 % OP SOLN: 1.0000 [drp] | Freq: Every day | OPHTHALMIC | 5 refills | Status: DC | PRN

## 2017-04-19 ENCOUNTER — Ambulatory Visit (INDEPENDENT_AMBULATORY_CARE_PROVIDER_SITE_OTHER): Payer: Medicare Other | Admitting: Allergy and Immunology

## 2017-04-19 ENCOUNTER — Encounter: Payer: Self-pay | Admitting: Allergy and Immunology

## 2017-04-19 VITALS — BP 118/72 | HR 70 | Resp 16

## 2017-04-19 DIAGNOSIS — J3089 Other allergic rhinitis: Secondary | ICD-10-CM | POA: Diagnosis not present

## 2017-04-19 DIAGNOSIS — K219 Gastro-esophageal reflux disease without esophagitis: Secondary | ICD-10-CM

## 2017-04-19 DIAGNOSIS — J4550 Severe persistent asthma, uncomplicated: Secondary | ICD-10-CM

## 2017-04-19 DIAGNOSIS — D721 Eosinophilia, unspecified: Secondary | ICD-10-CM

## 2017-04-19 DIAGNOSIS — H1045 Other chronic allergic conjunctivitis: Secondary | ICD-10-CM | POA: Diagnosis not present

## 2017-04-19 DIAGNOSIS — H1013 Acute atopic conjunctivitis, bilateral: Secondary | ICD-10-CM

## 2017-04-19 NOTE — Progress Notes (Signed)
Follow-up Note  Referring Provider: Salley Scarlet, MD Primary Provider: Salley Scarlet, MD Date of Office Visit: 04/19/2017  Subjective:   Brooke Schneider (DOB: September 13, 1950) is a 66 y.o. female who [facility] in re-evaluation of the following:  HPI: Emmylou presents to this clinic in reevaluation of her asthma and allergic rhinoconjunctivitis and LPR and eosinophilia.  Her last visit to this clinic was her initial evaluation of 29 March 2017.  She is really not that much better regarding everyone of her respiratory tract issues.  Although she has resolved most of her sneezing she still has intermittent wheezing and coughing and chest tightness and she still has throat clearing and drainage in her throat and intermittent raspy voice.  She actually thinks that her voice is somewhat worse and she feels as though a lump of mucus is stuck in her throat.  She still continues to drink Cokes every day.  She is not using her short acting bronchodilator.  Her skin condition is a lot better.  She is not having any hives at this point in time.  She did not perform allergen avoidance measures against dust mite to date.  Allergies as of 04/19/2017      Reactions   Sulfamethoxazole-trimethoprim Swelling   Metronidazole Itching, Rash      Medication List      albuterol 108 (90 Base) MCG/ACT inhaler Commonly known as:  PROAIR HFA Inhale 2 puffs into the lungs every 4 (four) hours as needed for wheezing or shortness of breath.   budesonide-formoterol 160-4.5 MCG/ACT inhaler Commonly known as:  SYMBICORT Inhale 2 puffs into the lungs 2 (two) times daily.   buPROPion 150 MG 24 hr tablet Commonly known as:  WELLBUTRIN XL TAKE 1 TABLET DAILY   CALCIUM 600+D 600-400 MG-UNIT tablet Generic drug:  Calcium Carbonate-Vitamin D Take 1 tablet by mouth 2 (two) times daily.   dicyclomine 20 MG tablet Commonly known as:  BENTYL Take 1 tablet (20  mg total) by mouth 4 (four) times daily -  before meals and at bedtime.   DIVIGEL 0.25 MG/0.25GM Gel Generic drug:  Estradiol Apply 1 application topically daily. Thigh area   hydrOXYzine 10 MG tablet Commonly known as:  ATARAX/VISTARIL Take 1 tablet (10 mg total) by mouth 3 (three) times daily as needed.   levothyroxine 125 MCG tablet Commonly known as:  SYNTHROID, LEVOTHROID Take 1 tablet (125 mcg total) by mouth daily.   montelukast 10 MG tablet Commonly known as:  SINGULAIR Take 1 tablet (10 mg total) by mouth at bedtime.   multivitamin with minerals tablet Take 1 tablet by mouth daily.   omeprazole 20 MG capsule Commonly known as:  PRILOSEC TAKE 1 CAPSULE DAILY   PAZEO 0.7 % Soln Generic drug:  Olopatadine HCl Place 1 drop daily as needed into both eyes.   propranolol ER 60 MG 24 hr capsule Commonly known as:  INDERAL LA TAKE 1 CAPSULE DAILY   ranitidine 300 MG capsule Commonly known as:  ZANTAC Take 1 capsule (300 mg total) by mouth every evening.   rizatriptan 10 MG tablet Commonly known as:  MAXALT Take 1 tablet (10 mg total) by mouth as needed for migraine. Reported on 08/12/2015   topiramate 50 MG tablet Commonly known as:  TOPAMAX TAKE 1 TABLET (50 MG) IN THE MORNING AND 2 TABLETS (100 MG) AT BEDTIME   zolpidem 10 MG tablet Commonly known as:  AMBIEN TAKE 1  TABLET AT BEDTIME AS NEEDED FOR SLEEP.       Past Medical History:  Diagnosis Date  . Allergic rhinitis   . Asthma   . Depression   . Goiter   . Goiter   . ZOXWRUEA(540.9Headache(784.0)     Past Surgical History:  Procedure Laterality Date  . ABDOMINAL HYSTERECTOMY    . KNEE SURGERY    . THYROIDECTOMY      Review of systems negative except as noted in HPI / PMHx or noted below:  Review of Systems  Constitutional: Negative.   HENT: Negative.   Eyes: Negative.   Respiratory: Negative.   Cardiovascular: Negative.   Gastrointestinal: Negative.   Genitourinary: Negative.   Musculoskeletal:  Negative.   Skin: Negative.   Neurological: Negative.   Endo/Heme/Allergies: Negative.   Psychiatric/Behavioral: Negative.      Objective:   Vitals:   04/19/17 1604  BP: 118/72  Pulse: 70  Resp: 16          Physical Exam  Constitutional: She is well-developed, well-nourished, and in no distress.  Slightly raspy voice, throat clearing  HENT:  Head: Normocephalic.  Right Ear: Tympanic membrane, external ear and ear canal normal.  Left Ear: Tympanic membrane, external ear and ear canal normal.  Nose: Nose normal. No mucosal edema or rhinorrhea.  Mouth/Throat: Uvula is midline, oropharynx is clear and moist and mucous membranes are normal. No oropharyngeal exudate.  Eyes: Conjunctivae are normal.  Neck: Trachea normal. No tracheal tenderness present. No tracheal deviation present. No thyromegaly present.  Cardiovascular: Normal rate, regular rhythm, S1 normal, S2 normal and normal heart sounds.  No murmur heard. Pulmonary/Chest: Breath sounds normal. No stridor. No respiratory distress. She has no wheezes. She has no rales.  Musculoskeletal: She exhibits no edema.  Lymphadenopathy:       Head (right side): No tonsillar adenopathy present.       Head (left side): No tonsillar adenopathy present.    She has no cervical adenopathy.  Neurological: She is alert. Gait normal.  Skin: No rash noted. She is not diaphoretic. No erythema. Nails show no clubbing.  Psychiatric: Mood and affect normal.    Diagnostics:    Spirometry was performed and demonstrated an FEV1 of 1.98 at 93 % of predicted.  The patient had an Asthma Control Test with the following results: ACT Total Score: 12.    Assessment and Plan:   1. Not well controlled severe persistent asthma   2. Other allergic rhinitis   3. Perennial allergic conjunctivitis of both eyes   4. LPRD (laryngopharyngeal reflux disease)   5. Eosinophilia     1. Perform Allergen avoidance measures - DUST MITE  2. Treat and  prevent inflammation:   A. BREO 200 - 1 inhalations once a day. Replaces Symbicort  B. OTC Nasacort - one spray each nostril one time per day  C. Singulair 10 mg - one tablet one time per day  3. Treat and prevent reflux:   A. slowly consolidate all caffeine and chocolate consumption  B. omeprazole to 40 mg in a.m.  C. ranitidine 300 mg in PM  4. If needed:   A. cetirizine 10 mg - one tablet 1-2 times a day  B. ProAir HFA or similar - 2 inhalations every 4-6 hours  C. Pataday - one drop each eye one time per day  5.  Start a course of immunotherapy  6.  Return to clinic  Christmas 2018 or earlier if problem  7.  ENT  evaluation to examine throat  I will continue to have [name] pursue a plan of allergen avoidance measures directed against specific aeroallergens and use anti-inflammatory medications for her respiratory tract as noted above.  She does not think that Symbicort is helped her that much and we will try her on Breo.  As well, we will start her on a course of immunotherapy as a long-term approach to her atopic disease.  She still continues to have significant issues with her throat and I think she warrants evaluation of her throat with an ENT doctor to make sure were not dealing with anything other than LPR contributing to the symptoms.  I will see her back in his clinic in a few months and will make a determination about further evaluation and treatment based upon her response.  Laurette SchimkeEric Gracilyn Gunia, MD Allergy / Immunology [facility]

## 2017-04-19 NOTE — Patient Instructions (Addendum)
  1. Perform Allergen avoidance measures - DUST MITE  2. Treat and prevent inflammation:   A. BREO 200 - 1 inhalations once a day. Replaces Symbicort  B. OTC Nasacort - one spray each nostril one time per day  C. Singulair 10 mg - one tablet one time per day  3. Treat and prevent reflux:   A. slowly consolidate all caffeine and chocolate consumption  B. omeprazole to 40 mg in a.m.  C. ranitidine 300 mg in PM  4. If needed:   A. cetirizine 10 mg - one tablet 1-2 times a day  B. ProAir HFA or similar - 2 inhalations every 4-6 hours  C. Pataday - one drop each eye one time per day  5.  Start a course of immunotherapy  6. Return to clinic  Christmas 2018 or earlier if problem  7.  ENT evaluation to examine throat

## 2017-04-20 ENCOUNTER — Encounter: Payer: Self-pay | Admitting: Allergy and Immunology

## 2017-04-20 ENCOUNTER — Other Ambulatory Visit: Payer: Self-pay | Admitting: Allergy and Immunology

## 2017-04-20 ENCOUNTER — Other Ambulatory Visit: Payer: Self-pay | Admitting: Family Medicine

## 2017-04-20 MED ORDER — FLUTICASONE FUROATE-VILANTEROL 200-25 MCG/INH IN AEPB: 1.0000 | INHALATION_SPRAY | Freq: Every day | RESPIRATORY_TRACT | 5 refills | Status: DC

## 2017-04-24 ENCOUNTER — Other Ambulatory Visit: Payer: Self-pay | Admitting: Family Medicine

## 2017-05-02 ENCOUNTER — Telehealth: Payer: Self-pay

## 2017-05-02 MED ORDER — AEROCHAMBER PLUS MISC: 2 refills | Status: DC

## 2017-05-02 MED ORDER — FLUTICASONE-SALMETEROL 115-21 MCG/ACT IN AERO: 2.0000 | INHALATION_SPRAY | Freq: Two times a day (BID) | RESPIRATORY_TRACT | 5 refills | Status: DC

## 2017-05-02 NOTE — Telephone Encounter (Signed)
Pharmacy has rejected Breo. Perferred med is Advair. Pt has not tried Advair in the past. Change?

## 2017-05-02 NOTE — Telephone Encounter (Signed)
Informed patient on change in inhaler. Also sent in spacer for patient.

## 2017-05-02 NOTE — Telephone Encounter (Signed)
Please prescribe patient Advair 115 2 inhalations twice a day to replace Brio utilized with a spacer

## 2017-05-03 ENCOUNTER — Other Ambulatory Visit: Payer: Self-pay | Admitting: *Deleted

## 2017-05-03 MED ORDER — HYDROXYZINE HCL 10 MG PO TABS: 10.0000 mg | ORAL_TABLET | Freq: Three times a day (TID) | ORAL | 2 refills | Status: DC | PRN

## 2017-05-03 MED ORDER — RIZATRIPTAN BENZOATE 10 MG PO TABS: 10.0000 mg | ORAL_TABLET | ORAL | 2 refills | Status: DC | PRN

## 2017-05-17 ENCOUNTER — Ambulatory Visit: Payer: Medicare Other | Admitting: Family Medicine

## 2017-05-19 ENCOUNTER — Other Ambulatory Visit: Payer: Self-pay | Admitting: *Deleted

## 2017-05-19 ENCOUNTER — Other Ambulatory Visit: Payer: Self-pay | Admitting: Nurse Practitioner

## 2017-05-19 MED ORDER — PAZEO 0.7 % OP SOLN: 1.0000 [drp] | Freq: Every day | OPHTHALMIC | 1 refills | Status: DC | PRN

## 2017-05-19 MED ORDER — FLUTICASONE-SALMETEROL 115-21 MCG/ACT IN AERO: INHALATION_SPRAY | RESPIRATORY_TRACT | 1 refills | Status: DC

## 2017-05-20 ENCOUNTER — Ambulatory Visit (INDEPENDENT_AMBULATORY_CARE_PROVIDER_SITE_OTHER): Payer: Medicare Other | Admitting: Family Medicine

## 2017-05-20 ENCOUNTER — Encounter: Payer: Self-pay | Admitting: Family Medicine

## 2017-05-20 ENCOUNTER — Other Ambulatory Visit: Payer: Self-pay

## 2017-05-20 VITALS — BP 122/68 | HR 82 | Temp 97.9°F | Resp 16 | Ht 66.0 in | Wt 216.0 lb

## 2017-05-20 DIAGNOSIS — F331 Major depressive disorder, recurrent, moderate: Secondary | ICD-10-CM | POA: Diagnosis not present

## 2017-05-20 DIAGNOSIS — J4540 Moderate persistent asthma, uncomplicated: Secondary | ICD-10-CM

## 2017-05-20 DIAGNOSIS — Z23 Encounter for immunization: Secondary | ICD-10-CM | POA: Diagnosis not present

## 2017-05-20 DIAGNOSIS — E890 Postprocedural hypothyroidism: Secondary | ICD-10-CM | POA: Diagnosis not present

## 2017-05-20 MED ORDER — ZOLPIDEM TARTRATE 10 MG PO TABS: 10.0000 mg | ORAL_TABLET | Freq: Every evening | ORAL | 2 refills | Status: DC | PRN

## 2017-05-20 MED ORDER — HYDROXYZINE HCL 10 MG PO TABS: 10.0000 mg | ORAL_TABLET | Freq: Three times a day (TID) | ORAL | 2 refills | Status: DC | PRN

## 2017-05-20 MED ORDER — BUPROPION HCL ER (XL) 300 MG PO TB24: 150.0000 mg | ORAL_TABLET | Freq: Every day | ORAL | 2 refills | Status: DC

## 2017-05-20 NOTE — Assessment & Plan Note (Signed)
Increase wellbutrin to 300mg  XR daily  Restart ambien for sleep No SI She agrees to therapy , will arrange

## 2017-05-20 NOTE — Addendum Note (Signed)
Addended by: Phillips OdorSIX, CHRISTINA H on: 05/20/2017 05:58 PM   Modules accepted: Orders

## 2017-05-20 NOTE — Patient Instructions (Addendum)
Wellbutrin increased to  300mg  - you can take of the 150mg  XR until you run out Referral to therapy  Flu shot done  F/U 6 weeks

## 2017-05-20 NOTE — Assessment & Plan Note (Signed)
Recheck TFT 

## 2017-05-20 NOTE — Assessment & Plan Note (Addendum)
Reviewed allergy notes with patient She is looking into cost for immunotherapy Atarax refilled

## 2017-05-20 NOTE — Progress Notes (Signed)
   Subjective:    Patient ID: Brooke Schneider, female    DOB: 01/23/1951, 66 y.o.   MRN: [id number]  Patient presents for Asthma Here to follow-up chronic medical problems.  She has been following with asthma and allergist I referred her to as she had recurrent hives and would have multiple environmental triggers to cough and congestion.  She does have underlying asthma.  She was on Symbicort per the notes this was not helping so [facility].  Insurance wont pay for [employer], will start  Advair  Instead. Also recommend she start immunotherapy.  Still uses atarax when she gets itching/hives  Follows with neurology for her chronic migraine she is on Topamax and also has Maxalt  Hypothyroidism-  she is due for repeat thyroid function studies.  States that still forgets to take her medication daily first thing in the morning.  Depression she takes Wellbutrin as prescribed.-also takes [place] for sleep, but has been out of the [place]. Things are [place], [place] to see him recently, states that things are just not good between them. Feels depressed and on edge a lot.   Review Of Systems:  GEN- denies fatigue, fever, weight loss,weakness, recent illness HEENT- denies eye drainage, change in vision, nasal discharge, CVS- denies chest pain, palpitations RESP- denies SOB, cough, wheeze ABD- denies N/V, change in stools, abd pain GU- denies dysuria, hematuria, dribbling, incontinence MSK- denies joint pain, muscle aches, injury Neuro- denies headache, dizziness, syncope, seizure activity       Objective:    BP 122/68   Pulse 82   Temp 97.9 F (36.6 C) (Oral)   Resp 16   Ht 5\' 6"  (1.676 m)   Wt 216 lb (98 kg)   SpO2 98%   BMI 34.86 kg/m  GEN- NAD, alert and oriented x3 HEENT- PERRL, EOMI, non injected sclera, pink conjunctiva, MMM, oropharynx clear Neck- Supple, no thyromegaly CVS- RRR, no  murmur RESP-CTAB ABD-NABS,soft,NT,ND Psych- normal affect, depressed mood, no SI, PQ9 score 21, normal speech  EXT- No edema Pulses- Radial 2+        Assessment & Plan:      Problem List Items Addressed This Visit      Unprioritized   MDD (major depressive disorder)    Increase wellbutrin to 300mg  XR daily  Restart ambien for sleep No SI She agrees to therapy , will arrange      Relevant Medications   buPROPion (WELLBUTRIN XL) 300 MG 24 hr tablet   hydrOXYzine (ATARAX/VISTARIL) 10 MG tablet   Hypothyroidism - Primary    Recheck TFT       Relevant Orders   TSH   T3, free   T4, free   Asthma, allergic    Reviewed allergy notes with patient She is looking into cost for immunotherapy Atarax refilled        Other Visit Diagnoses    Need for prophylactic vaccination and inoculation against influenza       Relevant Orders   Flu vaccine HIGH DOSE PF (Fluzone High dose) (Completed)      Note: This dictation was prepared with Dragon dictation along with smaller phrase technology. Any transcriptional errors that result from this process are unintentional.

## 2017-05-21 LAB — T4, FREE: Free T4: 1.1 ng/dL (ref 0.8–1.8)

## 2017-05-21 LAB — TSH: TSH: 3.77 mIU/L (ref 0.40–4.50)

## 2017-05-21 LAB — T3, FREE: T3 FREE: 2.7 pg/mL (ref 2.3–4.2)

## 2017-05-23 ENCOUNTER — Encounter: Payer: Self-pay | Admitting: *Deleted

## 2017-05-24 DIAGNOSIS — J342 Deviated nasal septum: Secondary | ICD-10-CM | POA: Diagnosis not present

## 2017-05-24 DIAGNOSIS — J310 Chronic rhinitis: Secondary | ICD-10-CM | POA: Diagnosis not present

## 2017-05-24 DIAGNOSIS — J343 Hypertrophy of nasal turbinates: Secondary | ICD-10-CM | POA: Diagnosis not present

## 2017-06-13 ENCOUNTER — Other Ambulatory Visit: Payer: Self-pay | Admitting: Family Medicine

## 2017-06-13 NOTE — Telephone Encounter (Signed)
Ok to refill??  Last office visit/ refill 05/20/2017.

## 2017-06-14 DIAGNOSIS — L259 Unspecified contact dermatitis, unspecified cause: Secondary | ICD-10-CM | POA: Diagnosis not present

## 2017-06-14 DIAGNOSIS — K5900 Constipation, unspecified: Secondary | ICD-10-CM | POA: Diagnosis not present

## 2017-06-14 DIAGNOSIS — N760 Acute vaginitis: Secondary | ICD-10-CM | POA: Diagnosis not present

## 2017-06-14 DIAGNOSIS — N952 Postmenopausal atrophic vaginitis: Secondary | ICD-10-CM | POA: Diagnosis not present

## 2017-06-20 ENCOUNTER — Ambulatory Visit: Payer: Medicare Other | Admitting: Family Medicine

## 2017-06-21 ENCOUNTER — Telehealth: Payer: Self-pay | Admitting: Family Medicine

## 2017-06-21 NOTE — Telephone Encounter (Signed)
[phone] Patient has lost her medication, it is Remus Lofflerambien would like a call back regarding this  [facility]

## 2017-06-21 NOTE — Telephone Encounter (Signed)
Call placed to pharmacy and early fill approved.   Call placed to patient and patient made aware.

## 2017-06-21 NOTE — Telephone Encounter (Signed)
Okay to refill the refill

## 2017-06-21 NOTE — Telephone Encounter (Signed)
Call placed to patient.   Reports that she has misplaced the prescription for Ambien. Reports that no one else has access to her prescriptions, so she does not think they were taken.   Patient has X2 refills at pharmacy, but was made aware that insurance would not cover prescription at this time if MD approved early fill. Verbalized understanding.   Ok to approve early fill x1?

## 2017-07-01 ENCOUNTER — Ambulatory Visit: Payer: Medicare Other | Admitting: Family Medicine

## 2017-07-05 ENCOUNTER — Other Ambulatory Visit: Payer: Self-pay | Admitting: Allergy and Immunology

## 2017-07-26 ENCOUNTER — Telehealth: Payer: Self-pay | Admitting: Nurse Practitioner

## 2017-07-26 NOTE — Telephone Encounter (Signed)
Pt requesting call from nurse regarding refills of Maxalt. She is waiting on [employer] but she only has 1 tablet left. [facility] to get her by. Best call back [phone] and okay to leave message.

## 2017-07-26 NOTE — Telephone Encounter (Signed)
Pt [phone]

## 2017-07-27 NOTE — Telephone Encounter (Signed)
Spoke to pt.  She has been getting maxalt from both pcp and us.  I recommended that she needed to use one provider for her medication treatment of migraines.  She related that in the summer that she was having migraines every 1-2 days and taking the maxalt almost every day. She is taking topamax as prescribed (50mg  po AM and 100mg  po PM).  I instructed that if having increase of migraines and medications are not helping, that there are other medication options she could try.  I offered to move up appt on 08-03-17 to see CM/NP.  She had appt with her pcp, on 07-29-17 and discuss with her.  If needed will call back.

## 2017-07-29 ENCOUNTER — Encounter: Payer: Self-pay | Admitting: Family Medicine

## 2017-07-29 ENCOUNTER — Ambulatory Visit (INDEPENDENT_AMBULATORY_CARE_PROVIDER_SITE_OTHER): Payer: Medicare Other | Admitting: Family Medicine

## 2017-07-29 VITALS — BP 124/78 | HR 69 | Temp 98.3°F | Resp 14 | Wt 209.4 lb

## 2017-07-29 DIAGNOSIS — K582 Mixed irritable bowel syndrome: Secondary | ICD-10-CM | POA: Diagnosis not present

## 2017-07-29 DIAGNOSIS — B360 Pityriasis versicolor: Secondary | ICD-10-CM | POA: Diagnosis not present

## 2017-07-29 DIAGNOSIS — F331 Major depressive disorder, recurrent, moderate: Secondary | ICD-10-CM

## 2017-07-29 DIAGNOSIS — K580 Irritable bowel syndrome with diarrhea: Secondary | ICD-10-CM | POA: Diagnosis not present

## 2017-07-29 DIAGNOSIS — G43909 Migraine, unspecified, not intractable, without status migrainosus: Secondary | ICD-10-CM | POA: Diagnosis not present

## 2017-07-29 MED ORDER — RIZATRIPTAN BENZOATE 10 MG PO TABS: 10.0000 mg | ORAL_TABLET | ORAL | 2 refills | Status: DC | PRN

## 2017-07-29 MED ORDER — DICYCLOMINE HCL 20 MG PO TABS: 20.0000 mg | ORAL_TABLET | Freq: Three times a day (TID) | ORAL | 3 refills | Status: DC

## 2017-07-29 MED ORDER — HYDROXYZINE HCL 10 MG PO TABS: 10.0000 mg | ORAL_TABLET | Freq: Three times a day (TID) | ORAL | 2 refills | Status: DC | PRN

## 2017-07-29 MED ORDER — FLUCONAZOLE 150 MG PO TABS: ORAL_TABLET | ORAL | 0 refills | Status: DC

## 2017-07-29 MED ORDER — TOPIRAMATE 50 MG PO TABS: ORAL_TABLET | ORAL | 3 refills | Status: DC

## 2017-07-29 NOTE — Assessment & Plan Note (Signed)
We will give her Diflucan 150 mg once a week for 4 weeks.  She can continue the Lotrimin

## 2017-07-29 NOTE — Assessment & Plan Note (Signed)
Refilled her dicyclomine this is only used as needed

## 2017-07-29 NOTE — Assessment & Plan Note (Signed)
States that she is doing better with the Wellbutrin at 300 mg.

## 2017-07-29 NOTE — Assessment & Plan Note (Signed)
Increase her Topamax to 100 mg twice a day continue the Inderal.  Hopefully this will cut down the use of the Maxalt.  Advised if this does not improve I recommend she go go back to her neurologist who has been treating her migraines.

## 2017-07-29 NOTE — Progress Notes (Signed)
   Subjective:    Patient ID: Brooke Schneider, female    DOB: 12/17/1950, 67 y.o.   MRN: [id number]  Patient presents for migraines; discuss medication; and Rash (on chest )   Same rash on chest, has allergist, continues to have outbreaks. Using lotrimin, has expired nizoral.   Has tinea veriscolor on chest diagnosed.  She does think is a little better with the Lotrimin    Has migraines- has neurologist, on topamax ,inderal,  Currently taking 50mg  in the morning and 100mg  at bedtime she continues to use her Maxalt at least 2-3 times a week.  There is a note from her neurologist in the chart.  They did offer an earlier appointment but she told him she was coming in to see me.  Approximately 20 minutes spent reviewing her medications she had old expired bottles of dicyclomine and 4 bottles of Inderal.  Multiple bottles of multiple medications.  Depression she is taking Wellbutrin 300 mg she does have 2 different bottles so 150 mg which she is taking 2 of as well as the new prescription sent back in December.  She states that she is not taking them at the same time  Review Of Systems:  GEN- denies fatigue, fever, weight loss,weakness, recent illness HEENT- denies eye drainage, change in vision, nasal discharge, CVS- denies chest pain, palpitations RESP- denies SOB, cough, wheeze ABD- denies N/V, change in stools, abd pain GU- denies dysuria, hematuria, dribbling, incontinence MSK- denies joint pain, muscle aches, injury Neuro- denies headache, dizziness, syncope, seizure activity       Objective:    BP 124/78   Pulse 69   Temp 98.3 F (36.8 C) (Oral)   Resp 14   Wt 209 lb 6.4 oz (95 kg)   SpO2 98%   BMI 33.80 kg/m  GEN- NAD, alert and oriented x3 HEENT- PERRL, EOMI, non injected sclera, pink conjunctiva, MMM, oropharynx clear CVS- RRR, no murmur RESP-CTAB Psych - normal affect and mood, no SI Skin- hyperpigmented macules across chest, between breast EXT- No edema Pulses-  Radial  2+        Assessment & Plan:     Approx 20 minutes alone spent on her medications, throwing out expired meds from 2017  Problem List Items Addressed This Visit      Unprioritized   Tinea versicolor - Primary    We will give her Diflucan 150 mg once a week for 4 weeks.  She can continue the Lotrimin      Migraine    Increase her Topamax to 100 mg twice a day continue the Inderal.  Hopefully this will cut down the use of the Maxalt.  Advised if this does not improve I recommend she go go back to her neurologist who has been treating her migraines.      Relevant Medications   topiramate (TOPAMAX) 50 MG tablet   rizatriptan (MAXALT) 10 MG tablet   MDD (major depressive disorder)    States that she is doing better with the Wellbutrin at 300 mg.      Relevant Medications   hydrOXYzine (ATARAX/VISTARIL) 10 MG tablet   IBS (irritable bowel syndrome)    Refilled her dicyclomine this is only used as needed      Relevant Medications   dicyclomine (BENTYL) 20 MG tablet      Note: This dictation was prepared with Dragon dictation along with smaller phrase technology. Any transcriptional errors that result from this process are unintentional.

## 2017-07-29 NOTE — Patient Instructions (Addendum)
Take 2 topamax in the morning and 2 in the evening for migraines Take the diflucan for the yeast on chest  F/U 4 months

## 2017-09-13 ENCOUNTER — Other Ambulatory Visit: Payer: Self-pay

## 2017-09-13 ENCOUNTER — Ambulatory Visit (INDEPENDENT_AMBULATORY_CARE_PROVIDER_SITE_OTHER): Payer: Medicare Other | Admitting: Family Medicine

## 2017-09-13 ENCOUNTER — Encounter: Payer: Self-pay | Admitting: Family Medicine

## 2017-09-13 VITALS — BP 122/64 | HR 66 | Temp 98.3°F | Resp 16 | Ht 66.0 in | Wt 205.0 lb

## 2017-09-13 DIAGNOSIS — B360 Pityriasis versicolor: Secondary | ICD-10-CM

## 2017-09-13 MED ORDER — TOPIRAMATE 50 MG PO TABS: ORAL_TABLET | ORAL | 6 refills | Status: DC

## 2017-09-13 MED ORDER — KETOCONAZOLE 2 % EX SHAM: 1.0000 "application " | MEDICATED_SHAMPOO | CUTANEOUS | 3 refills | Status: DC

## 2017-09-13 MED ORDER — RIZATRIPTAN BENZOATE 10 MG PO TABS: 10.0000 mg | ORAL_TABLET | ORAL | 2 refills | Status: DC | PRN

## 2017-09-13 NOTE — Patient Instructions (Addendum)
Follow up with allergist  Try the Nizoral again  F/U 4 months for PHYSICAL

## 2017-09-13 NOTE — Progress Notes (Signed)
   Subjective:    Patient ID: Brooke Schneider, female    DOB: 06/06/1951, 67 y.o.   MRN: [id number]  Patient presents for Rash (tinea versicolor outbreak)  Patient here with persistent tinea versicolor.  She use the Diflucan states that help some but the rash is now back.  She does not have any significant itching.  He is also been using the Lotrimin but no significant improvement with this.  She is scheduled to go back to see her allergist states that her allergies have been acting up she thinks she is going to proceed with getting the shots    Review Of Systems:  GEN- denies fatigue, fever, weight loss,weakness, recent illness HEENT- denies eye drainage, change in vision, nasal discharge, CVS- denies chest pain, palpitations RESP- denies SOB, cough, wheeze ABD- denies N/V, change in stools, abd pain GU- denies dysuria, hematuria, dribbling, incontinence MSK- denies joint pain, muscle aches, injury Neuro- denies headache, dizziness, syncope, seizure activity       Objective:    BP 122/64   Pulse 66   Temp 98.3 F (36.8 C) (Oral)   Resp 16   Ht 5\' 6"  (1.676 m)   Wt 205 lb (93 kg)   SpO2 98%   BMI 33.09 kg/m  GEN- NAD, alert and oriented x3 HEENT- PERRL, EOMI, non injected sclera, pink conjunctiva, MMM, oropharynx clear CVS- RRR, no murmur RESP-CTAB Rash- hyperpigmented macules across chest between breast        Assessment & Plan:      Problem List Items Addressed This Visit      Unprioritized   Tinea versicolor - Primary      Note: This dictation was prepared with Dragon dictation along with smaller phrase technology. Any transcriptional errors that result from this process are unintentional.

## 2017-09-16 ENCOUNTER — Other Ambulatory Visit: Payer: Self-pay | Admitting: *Deleted

## 2017-09-16 MED ORDER — RIZATRIPTAN BENZOATE 10 MG PO TABS: 10.0000 mg | ORAL_TABLET | ORAL | 2 refills | Status: DC | PRN

## 2017-09-16 MED ORDER — TOPIRAMATE 100 MG PO TABS: ORAL_TABLET | ORAL | 3 refills | Status: DC

## 2017-09-30 DIAGNOSIS — Z1231 Encounter for screening mammogram for malignant neoplasm of breast: Secondary | ICD-10-CM | POA: Diagnosis not present

## 2017-09-30 DIAGNOSIS — Z01419 Encounter for gynecological examination (general) (routine) without abnormal findings: Secondary | ICD-10-CM | POA: Diagnosis not present

## 2017-09-30 DIAGNOSIS — Z6835 Body mass index (BMI) 35.0-35.9, adult: Secondary | ICD-10-CM | POA: Diagnosis not present

## 2017-10-07 ENCOUNTER — Other Ambulatory Visit: Payer: Self-pay | Admitting: Family Medicine

## 2017-10-21 ENCOUNTER — Other Ambulatory Visit: Payer: Self-pay | Admitting: Family Medicine

## 2017-10-28 ENCOUNTER — Ambulatory Visit (INDEPENDENT_AMBULATORY_CARE_PROVIDER_SITE_OTHER): Payer: Medicare Other | Admitting: Family Medicine

## 2017-10-28 ENCOUNTER — Encounter: Payer: Self-pay | Admitting: Family Medicine

## 2017-10-28 ENCOUNTER — Other Ambulatory Visit: Payer: Self-pay

## 2017-10-28 VITALS — BP 126/82 | HR 82 | Temp 98.9°F | Resp 14 | Ht 66.0 in | Wt 202.0 lb

## 2017-10-28 DIAGNOSIS — T6291XA Toxic effect of unspecified noxious substance eaten as food, accidental (unintentional), initial encounter: Secondary | ICD-10-CM | POA: Diagnosis not present

## 2017-10-28 DIAGNOSIS — B360 Pityriasis versicolor: Secondary | ICD-10-CM | POA: Diagnosis not present

## 2017-10-28 DIAGNOSIS — IMO0001 Reserved for inherently not codable concepts without codable children: Secondary | ICD-10-CM

## 2017-10-28 NOTE — Patient Instructions (Addendum)
Dermatology referral You can try cortisone 10 or hydrocortisone, anti-itch lotion ( [employer]'s bond, Sarna/Aveeo) F/U as previous

## 2017-10-28 NOTE — Progress Notes (Signed)
   Subjective:    Patient ID: Brooke Schneider, female    DOB: 1951-05-30, 67 y.o.   MRN: [id number]  Patient presents for GI Issues (food poisioning from sausage) and Rash (reports that she continues to have itching)   Two weeks ago, ate some ( [employer]ese')sausage she thought may have been bad. Felt nausea and queasy, no vomiting, no diarrhea. Took peptobismol.  Eating normally , urinating normal, normal BM.    Continues to have itching all over at times, she has been advised to see her allergist to start shots again, has had multiple visits and complaints of itching, rash,allergies, has tinea veriscolor treating with Nizoral shampoo to affected areas Nizoral has helped some but now has itching over chest      Review Of Systems: per above  GEN- denies fatigue, fever, weight loss,weakness, recent illness HEENT- denies eye drainage, change in vision, nasal discharge, CVS- denies chest pain, palpitations RESP- denies SOB, cough, wheeze ABD- denies N/V, change in stools, abd pain GU- denies dysuria, hematuria, dribbling, incontinence MSK- denies joint pain, muscle aches, injury Neuro- denies headache, dizziness, syncope, seizure activity       Objective:    BP 126/82   Pulse 82   Temp 98.9 F (37.2 C) (Oral)   Resp 14   Ht  (1.676 m)   Wt 202 lb (91.6 kg)   SpO2 99%   BMI 32.60 kg/m  GEN- NAD, alert and oriented x3 HEENT- PERRL, EOMI, non injected sclera, pink conjunctiva, MMM, oropharynx clear Neck- Supple, no LAD CVS- RRR, no murmur RESP-CTAB ABD-NABS,soft,NT,ND EXT- No edema Skin - tinea rash on chest, Pulses- Radial  2+        Assessment & Plan:      Problem List Items Addressed This Visit      Unprioritized   Tinea versicolor - Primary    Continue nizoral, can spot treat with small amount of hydrocortisone, use anti-itch lotion Referral to dermatology, schedule appt with her allergist as well      Relevant Orders   Ambulatory referral to Dermatology     Other Visit Diagnoses    Food poisoning, accidental or unintentional, initial encounter       Resolved      Note: This dictation was prepared with Dragon dictation along with smaller phrase technology. Any transcriptional errors that result from this process are unintentional.

## 2017-10-31 NOTE — Assessment & Plan Note (Signed)
Continue nizoral, can spot treat with small amount of hydrocortisone, use anti-itch lotion Referral to dermatology, schedule appt with her allergist as well

## 2017-11-30 ENCOUNTER — Ambulatory Visit (INDEPENDENT_AMBULATORY_CARE_PROVIDER_SITE_OTHER): Payer: Medicare Other | Admitting: Allergy and Immunology

## 2017-11-30 ENCOUNTER — Encounter: Payer: Self-pay | Admitting: Allergy and Immunology

## 2017-11-30 ENCOUNTER — Ambulatory Visit
Admission: RE | Admit: 2017-11-30 | Discharge: 2017-11-30 | Disposition: A | Discharge status: Home / Self Care | Payer: Medicare Other | Source: Ambulatory Visit | Attending: Allergy and Immunology | Admitting: Allergy and Immunology

## 2017-11-30 VITALS — BP 126/74 | HR 76 | Resp 20

## 2017-11-30 DIAGNOSIS — H1045 Other chronic allergic conjunctivitis: Secondary | ICD-10-CM

## 2017-11-30 DIAGNOSIS — H1013 Acute atopic conjunctivitis, bilateral: Secondary | ICD-10-CM

## 2017-11-30 DIAGNOSIS — J4540 Moderate persistent asthma, uncomplicated: Secondary | ICD-10-CM

## 2017-11-30 DIAGNOSIS — D721 Eosinophilia, unspecified: Secondary | ICD-10-CM

## 2017-11-30 DIAGNOSIS — J3089 Other allergic rhinitis: Secondary | ICD-10-CM

## 2017-11-30 DIAGNOSIS — R05 Cough: Secondary | ICD-10-CM | POA: Diagnosis not present

## 2017-11-30 DIAGNOSIS — K219 Gastro-esophageal reflux disease without esophagitis: Secondary | ICD-10-CM

## 2017-11-30 MED ORDER — MOMETASONE FURO-FORMOTEROL FUM 200-5 MCG/ACT IN AERO: 2.0000 | INHALATION_SPRAY | Freq: Two times a day (BID) | RESPIRATORY_TRACT | 5 refills | Status: DC

## 2017-11-30 NOTE — Progress Notes (Signed)
Follow-up Note   Referring Provider: Salley Scarlet, MD Primary Provider: Salley Scarlet, MD Date of Office Visit: 11/30/2017  Subjective:   Brooke Schneider (DOB: 11/30/1950) is a 67 y.o. female who [facility] in re-evaluation of the following:  HPI: Brooke Schneider presents to this clinic in evaluation of asthma and allergic rhinoconjunctivitis and LPR and history of eosinophilia.  I last saw her in this clinic on 19 April 2017.  Brooke Schneider was having problems today identifying specific events and timing of the specific events that occurred over the course of the past 6 months or so.  She states that she had some continued problems with breathing and apparently developed a flareup of coughing sometime this winter or spring and for some reason discontinued her controller inhaler for some unknown reason and presently still continues to have some cough and must use a bronchodilator multiple times a week.  She is not sure if she required a systemic steroid or antibiotic to address her issue that occurred this winter or spring.  She believes that her nose is doing relatively well at this point in time.  She believes that her reflux is under good control at this point.  She may have had manipulation of her omeprazole dose although she is not entirely clear about what she is using at this point.  She is using 2 different agents which I suspect the other agent being ranitidine in the evening.  It does not sound as though she is drinking much caffeine or eating chocolate.  When I last saw her in this clinic I also recommended that she undergo a course of immunotherapy and have a ENT evaluation to look at her throat but it does not sound as though those steps were completed.  Allergies as of 11/30/2017      Reactions   Sulfamethoxazole-trimethoprim Swelling   Metronidazole Itching, Rash      Medication List      AEROCHAMBER PLUS inhaler Use as  instructed   albuterol 108 (90 Base) MCG/ACT inhaler Commonly known as:  PROAIR HFA Inhale 2 puffs into the lungs every 4 (four) hours as needed for wheezing or shortness of breath.   buPROPion 300 MG 24 hr tablet Commonly known as:  WELLBUTRIN XL Take 1 tablet (300 mg total) by mouth daily.   CALCIUM 600+D 600-400 MG-UNIT tablet Generic drug:  Calcium Carbonate-Vitamin D Take 1 tablet by mouth 2 (two) times daily.   dicyclomine 20 MG tablet Commonly known as:  BENTYL Take 1 tablet (20 mg total) by mouth 4 (four) times daily -  before meals and at bedtime.   DIVIGEL 0.25 MG/0.25GM Gel Generic drug:  Estradiol Apply 1 application topically daily. Thigh area   hydrOXYzine 10 MG tablet Commonly known as:  ATARAX/VISTARIL Take 1 tablet (10 mg total) by mouth 3 (three) times daily as needed.   ketoconazole 2 % shampoo Commonly known as:  NIZORAL Apply 1 application topically 2 (two) times a week. To affected areas   levothyroxine 125 MCG tablet Commonly known as:  SYNTHROID, LEVOTHROID TAKE 1 TABLET DAILY   LOTRIMIN AF EX Apply topically.   montelukast 10 MG tablet Commonly known as:  SINGULAIR Take 1 tablet (10 mg total) by mouth at bedtime.   multivitamin with minerals tablet Take 1 tablet by mouth daily.   naproxen sodium 220 MG tablet Commonly known as:  ALEVE Take 220 mg by mouth.   omeprazole 20  MG capsule Commonly known as:  PRILOSEC TAKE 1 CAPSULE DAILY   PAZEO 0.7 % Soln Generic drug:  Olopatadine HCl Place 1 drop into both eyes daily as needed.   propranolol ER 60 MG 24 hr capsule Commonly known as:  INDERAL LA TAKE 1 CAPSULE DAILY   ranitidine 300 MG capsule Commonly known as:  ZANTAC Take 1 capsule (300 mg total) by mouth every evening.   rizatriptan 10 MG tablet Commonly known as:  MAXALT Take 1 tablet (10 mg total) by mouth as needed for migraine. Reported on 08/12/2015   topiramate 100 MG tablet Commonly known as:  TOPAMAX TAKE 100 MG IN  THE MORNING AND 100 MG AT BEDTIME   zolpidem 10 MG tablet Commonly known as:  AMBIEN TAKE 1 TABLET AT BEDTIME AS NEEDED FOR SLEEP.       Past Medical History:  Diagnosis Date  . Allergic rhinitis   . Asthma   . Depression   . Goiter   . Goiter   . [id number](782.9)     Past Surgical History:  Procedure Laterality Date  . ABDOMINAL HYSTERECTOMY    . CHOLECYSTECTOMY N/A 11/02/2013   Procedure: LAPAROSCOPIC CHOLECYSTECTOMY ;  Surgeon: Cherylynn Ridges, MD;  Location: [facility] OR;  Service: General;  Laterality: N/A;  . KNEE SURGERY    . THYROIDECTOMY      Review of systems negative except as noted in HPI / PMHx or noted below:  Review of Systems  Constitutional: Negative.   HENT: Negative.   Eyes: Negative.   Respiratory: Negative.   Cardiovascular: Negative.   Gastrointestinal: Negative.   Genitourinary: Negative.   Musculoskeletal: Negative.   Skin: Negative.   Neurological: Negative.   Endo/Heme/Allergies: Negative.   Psychiatric/Behavioral: Negative.      Objective:   Vitals:   11/30/17 1053  BP: 126/74  Pulse: 76  Resp: 20  SpO2: 96%          Physical Exam  HENT:  Head: Normocephalic.  Right Ear: Tympanic membrane, external ear and ear canal normal.  Left Ear: Tympanic membrane, external ear and ear canal normal.  Nose: Nose normal. No mucosal edema or rhinorrhea.  Mouth/Throat: Uvula is midline, oropharynx is clear and moist and mucous membranes are normal. No oropharyngeal exudate.  Eyes: Conjunctivae are normal.  Neck: Trachea normal. No tracheal tenderness present. No tracheal deviation present. No thyromegaly present.  Cardiovascular: Normal rate, regular rhythm, S1 normal, S2 normal and normal heart sounds.  No murmur heard. Pulmonary/Chest: Breath sounds normal. No stridor. No respiratory distress. She has no wheezes (Expiratory wheezes posterior lung field left only). She has no rales.  Musculoskeletal: She exhibits no edema.  Lymphadenopathy:        Head (right side): No tonsillar adenopathy present.       Head (left side): No tonsillar adenopathy present.    She has no cervical adenopathy.  Neurological: She is alert.  Skin: No rash noted. She is not diaphoretic. No erythema. Nails show no clubbing.    Diagnostics:    Spirometry was performed and demonstrated an FEV1 of 2.02 at 101 % of predicted.  The patient had an Asthma Control Test with the following results: ACT Total Score: 10.    Results of the chest x-ray obtained 02 August 2015 identified the following:  Cardiac silhouette normal in size, unchanged. Thoracic aorta mildly tortuous and atherosclerotic, unchanged. Hilar and mediastinal contours otherwise unremarkable. Prominent bronchovascular markings diffusely and moderate central peribronchial thickening, unchanged. Lungs otherwise clear. No  localized airspace consolidation. No pleural effusions. No pneumothorax. Normal pulmonary vascularity. Degenerative changes and DISH involving the thoracic spine. No significant interval change.  Assessment and Plan:   1. Not well controlled moderate persistent asthma   2. Other allergic rhinitis   3. Perennial allergic conjunctivitis of both eyes   4. LPRD (laryngopharyngeal reflux disease)   5. Eosinophilia     1. Perform Allergen avoidance measures - DUST MITE  2. Treat and prevent inflammation:   A. Dulera 200 - 2 inhaltions 2 times per day with spacer  B. OTC Nasacort - one spray each nostril one time per day  C. Singulair 10 mg - one tablet one time per day  3. Treat and prevent reflux:   A. omeprazole to 40 mg in a.m.  B. ranitidine 300 mg in PM  4. If needed:   A. Antihistamine  B. ProAir HFA or similar - 2 inhalations every 4-6 hours  C. Pazeo - one drop each eye one time per day  5. Obtain a chest x-ray  6. Return to clinic in  4 weeks or earlier if problem   I have restarted Brooke Schneider on a contrNelva Busholler agent using Dulera and she will continue to use  a nasal steroid and a leukotriene modifier on a regular basis and I have encouraged her to also perform avoidance measures as best as possible against dust mites.  In addition, she will continue to utilize therapy directed against reflux.  She does have wheezing mostly confined to her left airway in her chest and we will obtain a chest x-ray to further investigate this issue.  I will see her back in this clinic in 4 weeks to assess her response to the plan noted above.  Laurette SchimkeEric Macari Zalesky, MD Allergy / Immunology [facility]

## 2017-11-30 NOTE — Patient Instructions (Addendum)
  1. Perform Allergen avoidance measures - DUST MITE  2. Treat and prevent inflammation:   A. Dulera 200 - 2 inhaltions 2 times per day with spacer  B. OTC Nasacort - one spray each nostril one time per day  C. Singulair 10 mg - one tablet one time per day  3. Treat and prevent reflux:   A. omeprazole to 40 mg in a.m.  B. ranitidine 300 mg in PM  4. If needed:   A. Antihistamine  B. ProAir HFA or similar - 2 inhalations every 4-6 hours  C. Pazeo - one drop each eye one time per day  5. Obtain a chest x-ray  6. Return to clinic  4 weeks or earlier if problem

## 2017-12-01 ENCOUNTER — Encounter: Payer: Self-pay | Admitting: Allergy and Immunology

## 2017-12-30 ENCOUNTER — Other Ambulatory Visit: Payer: Self-pay | Admitting: Family Medicine

## 2017-12-30 NOTE — Telephone Encounter (Signed)
Ok to refill??  Last office visit 09/13/2017.  Last refill 06/13/2017, #2 refills.

## 2018-01-03 ENCOUNTER — Encounter: Payer: Self-pay | Admitting: Allergy and Immunology

## 2018-01-03 ENCOUNTER — Ambulatory Visit (INDEPENDENT_AMBULATORY_CARE_PROVIDER_SITE_OTHER): Payer: Medicare Other | Admitting: Allergy and Immunology

## 2018-01-03 VITALS — BP 124/78 | HR 84 | Resp 18

## 2018-01-03 DIAGNOSIS — H1013 Acute atopic conjunctivitis, bilateral: Secondary | ICD-10-CM

## 2018-01-03 DIAGNOSIS — L299 Pruritus, unspecified: Secondary | ICD-10-CM | POA: Diagnosis not present

## 2018-01-03 DIAGNOSIS — J3089 Other allergic rhinitis: Secondary | ICD-10-CM | POA: Diagnosis not present

## 2018-01-03 DIAGNOSIS — D721 Eosinophilia, unspecified: Secondary | ICD-10-CM

## 2018-01-03 DIAGNOSIS — J4540 Moderate persistent asthma, uncomplicated: Secondary | ICD-10-CM | POA: Diagnosis not present

## 2018-01-03 DIAGNOSIS — H1045 Other chronic allergic conjunctivitis: Secondary | ICD-10-CM

## 2018-01-03 DIAGNOSIS — K219 Gastro-esophageal reflux disease without esophagitis: Secondary | ICD-10-CM | POA: Diagnosis not present

## 2018-01-03 MED ORDER — HYDROXYZINE HCL 10 MG PO TABS: 10.0000 mg | ORAL_TABLET | Freq: Three times a day (TID) | ORAL | 2 refills | Status: DC | PRN

## 2018-01-03 MED ORDER — METHYLPREDNISOLONE ACETATE 80 MG/ML IJ SUSP
80.0000 mg | Freq: Once | INTRAMUSCULAR | Status: AC
  Administered 2018-01-03: 80 mg via INTRAMUSCULAR

## 2018-01-03 NOTE — Progress Notes (Signed)
Follow-up Note  Referring Provider: Salley Scarleturham, Kawanta F, MD Primary Provider: Salley Scarleturham, Kawanta F, MD Date of Office Visit: 01/03/2018  Subjective:   Claudius SisNorma A Filler (DOB: 12/19/1950) is a 67 y.o. female who [facility] in re-evaluation of the following:  HPI: [name] returns to this clinic in reevaluation of her asthma and allergic rhinoconjunctivitis and LPR and history of eosinophilia addressed during her last evaluation 30 November 2017 at which point in time she appeared to be having lung inflammation.  Although she is better with her Elwin SleightDulera she still feels as though she has some issues occasionally with congestion in her chest.  She does not use any short acting bronchodilator.  At this point she thinks that her nose is doing relatively well.  She has not performed any house dust avoidance measures to date.  She also believes that her reflux is under very good control at this point in time.  She has been having an issue with itchiness which has apparently been a long-standing issue of many years duration for which she would use Atarax in the past.  However, she has run out of her Atarax and she would like a refill on this medication.  Allergies as of 01/03/2018      Reactions   Sulfamethoxazole-trimethoprim Swelling   Metronidazole Itching, Rash      Medication List      AEROCHAMBER PLUS inhaler Use as instructed   albuterol 108 (90 Base) MCG/ACT inhaler Commonly known as:  PROAIR HFA Inhale 2 puffs into the lungs every 4 (four) hours as needed for wheezing or shortness of breath.   buPROPion 300 MG 24 hr tablet Commonly known as:  WELLBUTRIN XL Take 1 tablet (300 mg total) by mouth daily.   CALCIUM 600+D 600-400 MG-UNIT tablet Generic drug:  Calcium Carbonate-Vitamin D Take 1 tablet by mouth 2 (two) times daily.   dicyclomine 20 MG tablet Commonly known as:  BENTYL Take 1 tablet (20 mg total) by mouth 4 (four) times daily -   before meals and at bedtime.   DIVIGEL 0.25 MG/0.25GM Gel Generic drug:  Estradiol Apply 1 application topically daily. Thigh area   Estradiol 10 MCG Tabs vaginal tablet   hydrOXYzine 10 MG tablet Commonly known as:  ATARAX/VISTARIL Take 1 tablet (10 mg total) by mouth 3 (three) times daily as needed.   ketoconazole 2 % shampoo Commonly known as:  NIZORAL Apply 1 application topically 2 (two) times a week. To affected areas   levothyroxine 125 MCG tablet Commonly known as:  SYNTHROID, LEVOTHROID TAKE 1 TABLET DAILY   LOTRIMIN AF EX Apply topically.   mometasone-formoterol 200-5 MCG/ACT Aero Commonly known as:  DULERA Inhale 2 puffs into the lungs 2 (two) times daily.   montelukast 10 MG tablet Commonly known as:  SINGULAIR Take 1 tablet (10 mg total) by mouth at bedtime.   multivitamin with minerals tablet Take 1 tablet by mouth daily.   omeprazole 20 MG capsule Commonly known as:  PRILOSEC TAKE 1 CAPSULE DAILY   PAZEO 0.7 % Soln Generic drug:  Olopatadine HCl Place 1 drop into both eyes daily as needed.   propranolol ER 60 MG 24 hr capsule Commonly known as:  INDERAL LA TAKE 1 CAPSULE DAILY   ranitidine 300 MG capsule Commonly known as:  ZANTAC Take 1 capsule (300 mg total) by mouth every evening.   rizatriptan 10 MG tablet Commonly known as:  MAXALT Take 1 tablet (10  mg total) by mouth as needed for migraine. Reported on 08/12/2015   topiramate 100 MG tablet Commonly known as:  TOPAMAX TAKE 100 MG IN THE MORNING AND 100 MG AT BEDTIME   zolpidem 10 MG tablet Commonly known as:  AMBIEN TAKE 1 TABLET AT BEDTIME AS NEEDED FOR SLEEP.       Past Medical History:  Diagnosis Date  . Allergic rhinitis   . Asthma   . Depression   . Goiter   . Goiter   . ZOXWRUEA(540.9)     Past Surgical History:  Procedure Laterality Date  . ABDOMINAL HYSTERECTOMY    . CHOLECYSTECTOMY N/A 11/02/2013   Procedure: LAPAROSCOPIC CHOLECYSTECTOMY ;  Surgeon: Cherylynn Ridges, MD;  Location: [facility] OR;  Service: General;  Laterality: N/A;  . KNEE SURGERY    . THYROIDECTOMY      Review of systems negative except as noted in HPI / PMHx or noted below:  Review of Systems  Constitutional: Negative.   HENT: Negative.   Eyes: Negative.   Respiratory: Negative.   Cardiovascular: Negative.   Gastrointestinal: Negative.   Genitourinary: Negative.   Musculoskeletal: Negative.   Skin: Negative.   Neurological: Negative.   Endo/Heme/Allergies: Negative.   Psychiatric/Behavioral: Negative.      Objective:   Vitals:   01/03/18 1522  BP: 124/78  Pulse: 84  Resp: 18  SpO2: 97%          Physical Exam  HENT:  Head: Normocephalic.  Right Ear: Tympanic membrane, external ear and ear canal normal.  Left Ear: Tympanic membrane, external ear and ear canal normal.  Nose: Nose normal. No mucosal edema or rhinorrhea.  Mouth/Throat: Uvula is midline, oropharynx is clear and moist and mucous membranes are normal. No oropharyngeal exudate.  Eyes: Conjunctivae are normal.  Neck: Trachea normal. No tracheal tenderness present. No tracheal deviation present. No thyromegaly present.  Cardiovascular: Normal rate, regular rhythm, S1 normal, S2 normal and normal heart sounds.  No murmur heard. Pulmonary/Chest: No stridor. No respiratory distress. She has wheezes (Bilateral expiratory wheezes all lung fields). She has no rales.  Musculoskeletal: She exhibits no edema.  Lymphadenopathy:       Head (right side): No tonsillar adenopathy present.       Head (left side): No tonsillar adenopathy present.    She has no cervical adenopathy.  Neurological: She is alert.  Skin: No rash noted. She is not diaphoretic. No erythema. Nails show no clubbing.    Diagnostics:    Spirometry was performed and demonstrated an FEV1 of 2.01 at 101 % of predicted.   Results of the chest x-ray obtained 30 November 2017 identified the following:  Mediastinum and hilar structures normal.  Heart size normal. No focal infiltrate. Mild left base pleural-parenchymal thickening consistent with scarring. No pleural effusion or pneumothorax. No acute bony abnormality. Degenerative change thoracic spine.  Assessment and Plan:   1. Not well controlled moderate persistent asthma   2. Other allergic rhinitis   3. Perennial allergic conjunctivitis of both eyes   4. LPRD (laryngopharyngeal reflux disease)   5. Eosinophilia   6. Pruritic disorder     1. Perform Allergen avoidance measures - DUST MITE  2.  Continue to treat and prevent inflammation:   A. Dulera 200 - 2 inhalations 2 times per day with spacer  B. OTC Nasacort - one spray each nostril one time per day  C. Singulair 10 mg - one tablet one time per day  D.  Depo-Medrol 80  IM delivered in clinic today  3.  Continue to treat and prevent reflux:   A. omeprazole to 40 mg in a.m.  B. ranitidine 300 mg in PM  4. If needed:   A. Antihistamine - Atarax 10mg  - one tablet every 8 hours if needed  B. ProAir HFA or similar - 2 inhalations every 4-6 hours  C. Pazeo - one drop each eye one time per day  5.  Consider starting a course of immunotherapy  6. Return to clinic  4 weeks or earlier if problem   Nizhoni still has evidence of inflammation of her airway and I will give her systemic steroid today and she will continue to use a large collection of anti-inflammatory agents for airway and also continue to treat LPR.  She can use Atarax for her pruritic disorder.  I recommended that she start a course of immunotherapy and perform allergen avoidance measures against house dust mite.  I will regroup with her in 4 weeks to make sure that she has resolved this issue with the plan noted above.  Further evaluation and treatment will be based upon her response.  Laurette Schimke, MD Allergy / Immunology [facility]

## 2018-01-03 NOTE — Patient Instructions (Addendum)
  1. Perform Allergen avoidance measures - DUST MITE  2.  Continue to treat and prevent inflammation:   A. Dulera 200 - 2 inhalations 2 times per day with spacer  B. OTC Nasacort - one spray each nostril one time per day  C. Singulair 10 mg - one tablet one time per day  D.  Depo-Medrol 80 IM delivered in clinic today  3.  Continue to treat and prevent reflux:   A. omeprazole to 40 mg in a.m.  B. ranitidine 300 mg in PM  4. If needed:   A. Antihistamine - Atarax 10mg  - one tablet every 8 hours if needed  B. ProAir HFA or similar - 2 inhalations every 4-6 hours  C. Pazeo - one drop each eye one time per day  5.  Consider starting a course of immunotherapy  6. Return to clinic  4 weeks or earlier if problem

## 2018-01-04 ENCOUNTER — Encounter: Payer: Self-pay | Admitting: Allergy and Immunology

## 2018-01-13 ENCOUNTER — Encounter: Payer: Medicare Other | Admitting: Family Medicine

## 2018-01-16 ENCOUNTER — Encounter: Payer: Self-pay | Admitting: Family Medicine

## 2018-01-23 ENCOUNTER — Other Ambulatory Visit: Payer: Self-pay | Admitting: Family Medicine

## 2018-01-23 MED ORDER — ZOLPIDEM TARTRATE 10 MG PO TABS: 10.0000 mg | ORAL_TABLET | Freq: Every evening | ORAL | 0 refills | Status: DC | PRN

## 2018-01-23 NOTE — Telephone Encounter (Signed)
Pt requesting 90 day supply [facility]

## 2018-01-23 NOTE — Telephone Encounter (Signed)
Ok to refill to mail order?  Last refill was given on 01/02/2018 for 30 day supply to [facility] pharmacy.

## 2018-01-27 ENCOUNTER — Other Ambulatory Visit: Payer: Self-pay | Admitting: Family Medicine

## 2018-02-08 NOTE — Progress Notes (Signed)
[facility]  PATIENT: Brooke Schneider DOB: 1951/05/24   REASON FOR VISIT: Follow-up for headaches HISTORY FROM: Patient    HISTORY OF PRESENT ILLNESS:UPDATE 9/5/2019CM [name], 67 year old female returns for follow-up with a history of migraine headaches.  She is having approximately 2 headaches per month.  She is currently on Topamax 100 mg twice daily and propanolol 60 mg daily.  Her meds are prescribed through her primary care.  In addition she is on Maxalt to take acutely.  She is complaining with some difficulty focusing.  She is also on Wellbutrin for depression.  She denies photophobia nausea vomiting or dizziness.  She is retired now.  Her mother has Alzheimer's disease and this is stressful for her as she helps with the caretaking.  She returns for reevaluation.  She also has been diagnosed with asthma since last seen and is now on an inhaler.   02/02/17 CMMs. [place], 67 year old female returns for followup.  She has a history of headaches since June 2012 starting at the mid left parietal area going to the frontal area and then will spread across the whole frontal area sometimes she will have pain on her ear and down the left side of her neck. The pain was initially like a sharp pain which would last for 2-3 hours, she was taking Maxalt with relief.  She is also taking Topamax twice daily. She did have some mild blurred vision and noise discomfort with these headaches initially. Denies photophobia, nausea, vomiting or dizziness.  Headaches occur about once  every  week and some weeks not any. Her Topamax is currently 50 mg in the morning and 100 at night. She feels that her current regimen is working well and that the headaches that she has are mild. She has retired from Engineer, manufacturing. She has a mother living who has memory loss and she says this is stressful for her. She returns for reevaluation. She needs refills on her medication . She is due to have  allergy testing.   REVIEW OF SYSTEMS: Full 14 system review of systems performed and notable only for those listed, all others are neg:  Constitutional: Fatigue Cardiovascular: neg Ear/Nose/Throat: neg  Skin: neg Eyes: neg Respiratory: Shortness of breath Gastroitestinal:  Hematology/Lymphatic: neg  Endocrine: neg Musculoskeletal:neg Allergy/Immunology: environmental allergies Neurological: Headaches Psychiatric:  Sleep : neg   ALLERGIES: Allergies  Allergen Reactions  . Sulfamethoxazole-Trimethoprim Swelling  . Metronidazole Itching and Rash    HOME MEDICATIONS: Outpatient Medications Prior to Visit  Medication Sig Dispense Refill  . albuterol (PROAIR HFA) 108 (90 Base) MCG/ACT inhaler Inhale 2 puffs into the lungs every 4 (four) hours as needed for wheezing or shortness of breath. 1 Inhaler 2  . buPROPion (WELLBUTRIN XL) 300 MG 24 hr tablet TAKE 1 TABLET DAILY (DOSE CHANGE) 90 tablet 4  . Calcium Carbonate-Vitamin D (CALCIUM 600+D) 600-400 MG-UNIT per tablet Take 1 tablet by mouth 2 (two) times daily.     . Clotrimazole (LOTRIMIN AF EX) Apply topically.    . dicyclomine (BENTYL) 20 MG tablet Take 1 tablet (20 mg total) by mouth 4 (four) times daily -  before meals and at bedtime. 90 tablet 3  . DIVIGEL 0.25 MG/0.25GM GEL Apply 1 application topically daily. Thigh area    . Estradiol 10 MCG TABS vaginal tablet     . hydrOXYzine (ATARAX/VISTARIL) 10 MG tablet Take 1 tablet (10 mg total) by mouth 3 (three) times daily as needed. 60 tablet 2  . ketoconazole (  NIZORAL) 2 % shampoo Apply 1 application topically 2 (two) times a week. To affected areas 120 mL 3  . levothyroxine (SYNTHROID, LEVOTHROID) 125 MCG tablet TAKE 1 TABLET DAILY 90 tablet 1  . mometasone-formoterol (DULERA) 200-5 MCG/ACT AERO Inhale 2 puffs into the lungs 2 (two) times daily. 1 Inhaler 5  . montelukast (SINGULAIR) 10 MG tablet Take 1 tablet (10 mg total) by mouth at bedtime. 30 tablet 5  . Multiple  Vitamins-Minerals (MULTIVITAMIN WITH MINERALS) tablet Take 1 tablet by mouth daily.    Marland Kitchen omeprazole (PRILOSEC) 20 MG capsule TAKE 1 CAPSULE DAILY 90 capsule 3  . PAZEO 0.7 % SOLN Place 1 drop into both eyes daily as needed. 3 Bottle 1  . propranolol ER (INDERAL LA) 60 MG 24 hr capsule TAKE 1 CAPSULE DAILY 90 capsule 3  . ranitidine (ZANTAC) 300 MG capsule Take 1 capsule (300 mg total) by mouth every evening. 30 capsule 5  . rizatriptan (MAXALT) 10 MG tablet Take 1 tablet (10 mg total) by mouth as needed for migraine. Reported on 08/12/2015 18 tablet 2  . Spacer/Aero-Holding Chambers (AEROCHAMBER PLUS) inhaler Use as instructed 1 each 2  . topiramate (TOPAMAX) 100 MG tablet TAKE 100 MG IN THE MORNING AND 100 MG AT BEDTIME 180 tablet 3  . zolpidem (AMBIEN) 10 MG tablet Take 1 tablet (10 mg total) by mouth at bedtime as needed. for sleep 90 tablet 0   No facility-administered medications prior to visit.     PAST MEDICAL HISTORY: Past Medical History:  Diagnosis Date  . Allergic rhinitis   . Asthma   . Depression   . Goiter   . Goiter   . Headache(784.0)     PAST SURGICAL HISTORY: Past Surgical History:  Procedure Laterality Date  . ABDOMINAL HYSTERECTOMY    . CHOLECYSTECTOMY N/A 11/02/2013   Procedure: LAPAROSCOPIC CHOLECYSTECTOMY ;  Surgeon: Cherylynn Ridges, MD;  [facility];  Service: General;  Laterality: N/A;  . KNEE SURGERY    . THYROIDECTOMY      FAMILY HISTORY: Family History  Problem Relation Age of Onset  . Allergies Mother   . ALS Father   . Prostate cancer Father     SOCIAL HISTORY: Social History   Socioeconomic History  . Marital status: Married    Spouse name: [name]  . Number of children: 2  . Years of education: college  . Highest education level: Not on file  Occupational History  . Occupation: housewife  Social Needs  . Financial resource strain: Not on file  . Food insecurity:    Worry: Not on file    Inability: Not on file  . Transportation  needs:    Medical: Not on file    Non-medical: Not on file  Tobacco Use  . Smoking status: Passive Smoke Exposure - Never Smoker  . Smokeless tobacco: Never Used  . Tobacco comment: husband smoking cigars  Substance and Sexual Activity  . Alcohol use: Yes    Comment: occ  . Drug use: No  . Sexual activity: Not Currently  Lifestyle  . Physical activity:    Days per week: Not on file    Minutes per session: Not on file  . Stress: Not on file  Relationships  . Social connections:    Talks on phone: Not on file    Gets together: Not on file    Attends religious service: Not on file    Active member of club or organization: Not on file  Attends meetings of clubs or organizations: Not on file    Relationship status: Not on file  . Intimate partner violence:    Fear of current or ex partner: Not on file    Emotionally abused: Not on file    Physically abused: Not on file    Forced sexual activity: Not on file  Other Topics Concern  . Not on file  Social History Narrative   Patient lives at home with her husband [name]).   Retired.   Education college B.S.   Right handed.   Caffeine one cup of coffee daily.     PHYSICAL EXAM  Vitals:   02/09/18 1439  BP: 127/80  Pulse: 84  Weight: 212 lb 3.2 oz (96.3 kg)  Height: 5\' 6"  (1.676 m)   Body mass index is 34.25 kg/m. General: well developed, well nourished, seated, in no evident distress  Head: head normocephalic and atraumatic. Oropharynx benign  Neck: supple    Neurologic Exam  Mental Status: Awake and fully alert. Oriented to place and time. Follows all commands. Speech and language normal.  Cranial Nerves: Pupils equal, briskly reactive to light. Extraocular movements full without nystagmus. Visual fields full to confrontation. Hearing intact and symmetric to finger snap. Facial sensation intact. Face, tongue, palate move normally and symmetrically. Neck flexion and extension normal.  Motor: Normal bulk and  tone. Normal strength in all tested extremity muscles.No focal weakness  Coordination: Rapid alternating movements normal in all extremities. Finger-to-nose and heel-to-shin performed accurately bilaterally.  Gait and Station: Arises from chair without difficulty. Stance is normal. . Able to heel, toe and tandem walk without difficulty.  Reflexes:1+ and symmetric. Toes downgoing.  DIAGNOSTIC DATA (LABS, IMAGING, TESTING) - I reviewed patient records, labs, notes, testing and imaging myself where available.  Lab Results  Component Value Date   WBC 4.8 09/09/2016   HGB 13.3 09/09/2016   HCT 40.3 09/09/2016   MCV 92.4 09/09/2016   PLT 332 09/09/2016      Component Value Date/Time   NA 140 09/09/2016 1020   K 4.0 09/09/2016 1020   CL 109 09/09/2016 1020   CO2 19 (L) 09/09/2016 1020   GLUCOSE 94 09/09/2016 1020   BUN 9 09/09/2016 1020   CREATININE 0.96 09/09/2016 1020   CALCIUM 9.0 09/09/2016 1020   PROT 7.0 09/09/2016 1020   ALBUMIN 3.9 09/09/2016 1020   AST 13 09/09/2016 1020   ALT 14 09/09/2016 1020   ALKPHOS 51 09/09/2016 1020   BILITOT 0.3 09/09/2016 1020   GFRNONAA 62 09/09/2016 1020   GFRAA 72 09/09/2016 1020    ASSESSMENT AND PLAN  67 y.o. year old female  has a past medical history of Asthma; Allergic rhinitis; Depression;  and Headache(784.0). here to follow-up for her headaches.  She is having 2 migraines per month.  Decrease  Topamax  To 100mg  mg in the pm, already refilled by PCPdue to inability to focus Continue propranolol 60 mg daily this is also a migraine preventive refilled by PCP Continue Maxalt 10 mg when necessary acute migraine refilled by PCP Followup yearly and when necessary Call for increase in headaches Given information on rebound headaches and general info about migraines to include diet, stress relief, importance of exercise, etc.  Continue Wellbutrin for depression F/U yearly Nilda Riggs, [facility], [facility], APRN  [facility] [address], [address] [place], [place] [address] [phone]

## 2018-02-09 ENCOUNTER — Ambulatory Visit (INDEPENDENT_AMBULATORY_CARE_PROVIDER_SITE_OTHER): Payer: Medicare Other | Admitting: Nurse Practitioner

## 2018-02-09 ENCOUNTER — Encounter: Payer: Self-pay | Admitting: Nurse Practitioner

## 2018-02-09 VITALS — BP 127/80 | HR 84 | Ht 66.0 in | Wt 212.2 lb

## 2018-02-09 DIAGNOSIS — G43909 Migraine, unspecified, not intractable, without status migrainosus: Secondary | ICD-10-CM

## 2018-02-09 NOTE — Patient Instructions (Signed)
Decrease  Topamax  To 100mg  mg in the pm, already refilled by PCP Continue propranolol 60 mg daily this is also a migraine preventive refilled by PCP Continue Maxalt 10 mg when necessary acute migraine refilled by PCP Followup yearly and when necessary Call for increase in headaches Given information on rebound headaches and general info about migraines to include diet, stress relief, importance of exercise, etc.  Continue Wellbutrin for depression F/U yearly

## 2018-02-13 NOTE — Progress Notes (Signed)
I have reviewed and agreed above plan. 

## 2018-02-20 ENCOUNTER — Encounter: Payer: Self-pay | Admitting: Physician Assistant

## 2018-02-20 ENCOUNTER — Ambulatory Visit (INDEPENDENT_AMBULATORY_CARE_PROVIDER_SITE_OTHER): Payer: Medicare Other | Admitting: Physician Assistant

## 2018-02-20 VITALS — BP 132/90 | HR 72 | Temp 98.4°F | Resp 16 | Ht 64.0 in | Wt 208.0 lb

## 2018-02-20 DIAGNOSIS — Z1159 Encounter for screening for other viral diseases: Secondary | ICD-10-CM

## 2018-02-20 DIAGNOSIS — Z0000 Encounter for general adult medical examination without abnormal findings: Secondary | ICD-10-CM

## 2018-02-20 DIAGNOSIS — E039 Hypothyroidism, unspecified: Secondary | ICD-10-CM

## 2018-02-20 NOTE — Progress Notes (Signed)
Subjective:   Brooke Schneider is a 67 y.o. female who presents for Medicare Annual (Subsequent) preventive examination.  Review of Systems:   Cardiac Risk Factors include:  Age     Objective:     Vitals: BP 132/90   Pulse 72   Temp 98.4 F (36.9 C) (Oral)   Resp 16   Ht 5\' 4"  (1.626 m)   Wt 94.3 kg   SpO2 99%   BMI 35.70 kg/m   Body mass index is 35.7 kg/m.  Advanced Directives 02/20/2018 01/02/2016 08/02/2015  Does Patient Have a Medical Advance Directive? No No No  Would patient like information on creating a medical advance directive?  No - patient declined information No - patient declined information   Today I have given her a handout of information that she will take home to review.  This reviews the following topics of information: Advanced Directives Living Will Healthcare Power of Attorney    Tobacco Social History   Tobacco Use  Smoking Status Passive Smoke Exposure - Never Smoker  Smokeless Tobacco Never Used  Tobacco Comment   husband smoking cigars     Counseling given: Not Answered Comment: husband smoking cigars     Past Medical History:  Diagnosis Date  . Allergic rhinitis   . Asthma   . Depression   . Goiter   . Goiter   . ZOXWRUEA(540.9)    Past Surgical History:  Procedure Laterality Date  . ABDOMINAL HYSTERECTOMY    . CHOLECYSTECTOMY N/A 11/02/2013   Procedure: LAPAROSCOPIC CHOLECYSTECTOMY ;  Surgeon: Cherylynn Ridges, MD;  Location: [facility] OR;  Service: General;  Laterality: N/A;  . KNEE SURGERY    . THYROIDECTOMY     Family History  Problem Relation Age of Onset  . Allergies Mother   . ALS Father   . Prostate cancer Father    Social History   Socioeconomic History  . Marital status: Married    Spouse name: Brooke Schneider  . Number of children: 2  . Years of education: college  . Highest education level: Not on file  Occupational History  . Occupation: housewife  Social Needs  . Financial resource strain: Not on file  . Food  insecurity:    Worry: Not on file    Inability: Not on file  . Transportation needs:    Medical: Not on file    Non-medical: Not on file  Tobacco Use  . Smoking status: Passive Smoke Exposure - Never Smoker  . Smokeless tobacco: Never Used  . Tobacco comment: husband smoking cigars  Substance and Sexual Activity  . Alcohol use: Yes    Comment: occ  . Drug use: No  . Sexual activity: Not Currently  Lifestyle  . Physical activity:    Days per week: Not on file    Minutes per session: Not on file  . Stress: Not on file  Relationships  . Social connections:    Talks on phone: Not on file    Gets together: Not on file    Attends religious service: Not on file    Active member of club or organization: Not on file    Attends meetings of clubs or organizations: Not on file    Relationship status: Not on file  Other Topics Concern  . Not on file  Social History Narrative   Patient lives at home with her husband Brooke Schneider).   Retired.   Education college B.S.   Right handed.   Caffeine one cup  of coffee daily.    Outpatient Encounter Medications as of 02/20/2018  Medication Sig  . albuterol (PROAIR HFA) 108 (90 Base) MCG/ACT inhaler Inhale 2 puffs into the lungs every 4 (four) hours as needed for wheezing or shortness of breath.  Marland Kitchen buPROPion (WELLBUTRIN XL) 300 MG 24 hr tablet TAKE 1 TABLET DAILY (DOSE CHANGE)  . Calcium Carbonate-Vitamin D (CALCIUM 600+D) 600-400 MG-UNIT per tablet Take 1 tablet by mouth 2 (two) times daily.   . Clotrimazole (LOTRIMIN AF EX) Apply topically.  . dicyclomine (BENTYL) 20 MG tablet Take 1 tablet (20 mg total) by mouth 4 (four) times daily -  before meals and at bedtime.  Marland Kitchen DIVIGEL 0.25 MG/0.25GM GEL Apply 1 application topically daily. Thigh area  . Estradiol 10 MCG TABS vaginal tablet   . hydrOXYzine (ATARAX/VISTARIL) 10 MG tablet Take 1 tablet (10 mg total) by mouth 3 (three) times daily as needed.  Marland Kitchen ketoconazole (NIZORAL) 2 % shampoo Apply 1  application topically 2 (two) times a week. To affected areas  . levothyroxine (SYNTHROID, LEVOTHROID) 125 MCG tablet TAKE 1 TABLET DAILY  . montelukast (SINGULAIR) 10 MG tablet Take 1 tablet (10 mg total) by mouth at bedtime.  . Multiple Vitamins-Minerals (MULTIVITAMIN WITH MINERALS) tablet Take 1 tablet by mouth daily.  Marland Kitchen omeprazole (PRILOSEC) 20 MG capsule TAKE 1 CAPSULE DAILY  . PAZEO 0.7 % SOLN Place 1 drop into both eyes daily as needed.  . propranolol ER (INDERAL LA) 60 MG 24 hr capsule TAKE 1 CAPSULE DAILY  . ranitidine (ZANTAC) 300 MG capsule Take 1 capsule (300 mg total) by mouth every evening.  . rizatriptan (MAXALT) 10 MG tablet Take 1 tablet (10 mg total) by mouth as needed for migraine. Reported on 08/12/2015  . Spacer/Aero-Holding Chambers (AEROCHAMBER PLUS) inhaler Use as instructed  . topiramate (TOPAMAX) 100 MG tablet TAKE 100 MG IN THE MORNING AND 100 MG AT BEDTIME  . zolpidem (AMBIEN) 10 MG tablet Take 1 tablet (10 mg total) by mouth at bedtime as needed. for sleep  . mometasone-formoterol (DULERA) 200-5 MCG/ACT AERO Inhale 2 puffs into the lungs 2 (two) times daily. (Patient not taking: Reported on 02/20/2018)   No facility-administered encounter medications on file as of 02/20/2018.     Activities of Daily Living In your present state of health, do you have any difficulty performing the following activities: 02/20/2018  Hearing? N  Vision? N  Difficulty concentrating or making decisions? Y  Walking or climbing stairs? Y  Dressing or bathing? N  Doing errands, shopping? N  Preparing Food and eating ? N  Using the Toilet? N  In the past six months, have you accidently leaked urine? N  Do you have problems with loss of bowel control? N  Managing your Medications? N  Managing your Finances? N  Housekeeping or managing your Housekeeping? N  Some recent data might be hidden    Patient Care Team: [facility], Velna Hatchet, MD as PCP - General (Family Medicine) Nilda Riggs, NP as Nurse Practitioner (Family Medicine) Lucie Leather, Alvira Philips, MD as Consulting Physician (Allergy and Immunology) Harold Hedge, MD as Consulting Physician (Obstetrics and Gynecology)  Also sees eye doctor and Dentist routinely.     Assessment:   This is a routine wellness examination for [place].  Exercise Activities and Dietary recommendations Current Exercise Habits: The patient does not participate in regular exercise at present    Fall Risk Fall Risk  02/20/2018 07/29/2017 05/20/2017 09/13/2016 10/17/2015  Falls in the past  year? No No No No No   Is the patient's home free of loose throw rugs in walkways, pet beds, electrical cords, etc?   yes      Grab bars in the bathroom? no      Handrails on the stairs?   yes      Adequate lighting?   yes   Depression Screen PHQ 2/9 Scores 02/20/2018 07/29/2017 05/20/2017 09/13/2016  PHQ - 2 Score 6 5 0 1  PHQ- 9 Score 21 20 - 8     Cognitive Function     6CIT Screen 02/20/2018  What Year? 0 points  What month? 0 points  What time? 3 points  Count back from 20 0 points  Months in reverse 0 points  Repeat phrase 0 points  Total Score 3    Immunization History  Administered Date(s) Administered  . Influenza Split 02/22/2011  . Influenza Whole 04/27/2010  . Influenza, High Dose Seasonal PF 05/20/2017  . Influenza,inj,Quad PF,6+ Mos 05/16/2015, 02/10/2016  . Pneumococcal Conjugate-13 06/14/2016  . Pneumococcal Polysaccharide-23 06/07/2004  . Td 07/12/2006  . Tdap 07/12/2006  . Zoster 08/05/2011    She has had both Pneumovax 23 and Prevnar 13. She has had zostervax. Discussed flu vaccine today but she defers.  Screening Tests Health Maintenance  Topic Date Due  . Hepatitis C Screening  06/05/1951  . DEXA SCAN  10/08/2015  . TETANUS/TDAP  07/12/2016  . PNA vac Low Risk Adult (2 of 2 - PPSV23) 06/14/2017  . MAMMOGRAM  07/20/2017  . INFLUENZA VACCINE  01/05/2018  . COLONOSCOPY  06/08/2019   Reviewed chart.  I see  some reference to a colonoscopy in 2011.  Patient states that her last colonoscopy was performed by Dr. Philipp DeputyNate Mann.  Today I have had her complete record request to get colonoscopy report.  Also reviewed that her last mammogram that we have documented was years ago.  She states that she has had recent mammogram through her gynecologist.  I have also given her record release form to get mammogram report.  She has not had hepatitis C screening so will check this today. Reviewed labs.  She did have CBC, cmet and lipid panel 09/2016.  So will not repeat these today.  Will check TSH to monitor as she is on thyroid medication and will check her hep C.      Plan:      I have personally reviewed and noted the following in the patient's chart:   . Medical and social history . Use of alcohol, tobacco or illicit drugs  . Current medications and supplements . Functional ability and status . Nutritional status . Physical activity . Advanced directives . List of other physicians . Hospitalizations, surgeries, and ER visits in previous 12 months . Vitals . Screenings to include cognitive, depression, and falls . Referrals and appointments  In addition, I have reviewed and discussed with patient certain preventive protocols, quality metrics, and best practice recommendations. A written personalized care plan for preventive services as well as general preventive health recommendations were provided to patient.   1. Medicare annual wellness visit, subsequent - Hepatitis C antibody  2. Hypothyroidism, unspecified type - TSH  3. Need for hepatitis C screening test - Hepatitis C antibody   Frazier RichardsMary Beth Yuli Lanigan, PA-C  02/20/2018

## 2018-02-21 LAB — TSH: TSH: 2.38 mIU/L (ref 0.40–4.50)

## 2018-02-21 LAB — HEPATITIS C ANTIBODY
HEP C AB: NONREACTIVE
SIGNAL TO CUT-OFF: 0.03 (ref <1.00)

## 2018-03-14 ENCOUNTER — Telehealth: Payer: Self-pay | Admitting: *Deleted

## 2018-03-14 ENCOUNTER — Ambulatory Visit (INDEPENDENT_AMBULATORY_CARE_PROVIDER_SITE_OTHER): Payer: Medicare Other | Admitting: Allergy and Immunology

## 2018-03-14 ENCOUNTER — Encounter: Payer: Self-pay | Admitting: Allergy and Immunology

## 2018-03-14 VITALS — BP 124/82 | HR 84 | Resp 20 | Ht 64.5 in | Wt 208.0 lb

## 2018-03-14 DIAGNOSIS — D721 Eosinophilia, unspecified: Secondary | ICD-10-CM

## 2018-03-14 DIAGNOSIS — L299 Pruritus, unspecified: Secondary | ICD-10-CM

## 2018-03-14 DIAGNOSIS — J3089 Other allergic rhinitis: Secondary | ICD-10-CM

## 2018-03-14 DIAGNOSIS — H1045 Other chronic allergic conjunctivitis: Secondary | ICD-10-CM

## 2018-03-14 DIAGNOSIS — J4540 Moderate persistent asthma, uncomplicated: Secondary | ICD-10-CM | POA: Diagnosis not present

## 2018-03-14 DIAGNOSIS — H1013 Acute atopic conjunctivitis, bilateral: Secondary | ICD-10-CM

## 2018-03-14 DIAGNOSIS — K219 Gastro-esophageal reflux disease without esophagitis: Secondary | ICD-10-CM | POA: Diagnosis not present

## 2018-03-14 MED ORDER — HYDROXYZINE HCL 10 MG PO TABS: 10.0000 mg | ORAL_TABLET | Freq: Three times a day (TID) | ORAL | 2 refills | Status: DC | PRN

## 2018-03-14 MED ORDER — OMEPRAZOLE 40 MG PO CPDR: 40.0000 mg | DELAYED_RELEASE_CAPSULE | Freq: Every day | ORAL | 5 refills | Status: DC

## 2018-03-14 MED ORDER — ALBUTEROL SULFATE HFA 108 (90 BASE) MCG/ACT IN AERS: 2.0000 | INHALATION_SPRAY | RESPIRATORY_TRACT | 2 refills | Status: DC | PRN

## 2018-03-14 MED ORDER — MONTELUKAST SODIUM 10 MG PO TABS: 10.0000 mg | ORAL_TABLET | Freq: Every day | ORAL | 5 refills | Status: DC

## 2018-03-14 MED ORDER — RANITIDINE HCL 300 MG PO CAPS: 300.0000 mg | ORAL_CAPSULE | Freq: Every evening | ORAL | 5 refills | Status: DC

## 2018-03-14 MED ORDER — MOMETASONE FURO-FORMOTEROL FUM 200-5 MCG/ACT IN AERO: 2.0000 | INHALATION_SPRAY | Freq: Two times a day (BID) | RESPIRATORY_TRACT | 5 refills | Status: DC

## 2018-03-14 NOTE — Progress Notes (Signed)
Follow-up Note  Referring Provider: Kirby Funk, MD Primary Provider: Kirby Funk, MD Date of Office Visit: 03/14/2018  Subjective:   Brooke Schneider (DOB: [birth year]) is a 67 y.o. female [facility] in re-evaluation of the following:  HPI: Brooke Schneider presents to this clinic in reevaluation of her asthma and allergic rhinoconjunctivitis and LPR and eosinophilia and pruritic disorder addressed during her last evaluation of 03 January 2018.  She has developed some problems with coughing and congestion in her chest.  She has discontinued her Dulera when she ran out of samples.  She has not had to use a short acting bronchodilator.  Overall her nose appears to be doing relatively well at this point in time.  She can smell and taste without any problem.  She still continues to have an issue with her throat.  She feels as though it is congested and irritated.  She does consistently use therapy directed against reflux with a proton pump inhibitor and H2 receptor blocker and believes that her reflux is under good control.  Apparently several years ago she might have seen an ENT doctor but she does not remember him examining her throat.  Her pruritic disorder is under very good control while using Atarax.  Allergies as of 03/14/2018      Reactions   Sulfamethoxazole-trimethoprim Swelling   Metronidazole Itching, Rash      Medication List      AEROCHAMBER PLUS inhaler Use as instructed   albuterol 108 (90 Base) MCG/ACT inhaler Commonly known as:  PROVENTIL HFA;VENTOLIN HFA Inhale 2 puffs into the lungs every 4 (four) hours as needed for wheezing or shortness of breath.   buPROPion 300 MG 24 hr tablet Commonly known as:  WELLBUTRIN XL TAKE 1 TABLET DAILY (DOSE CHANGE)   CALCIUM 600+D 600-400 MG-UNIT tablet Generic drug:  Calcium Carbonate-Vitamin D Take 1 tablet by mouth 2 (two) times daily.   dicyclomine 20 MG tablet Commonly known  as:  BENTYL Take 1 tablet (20 mg total) by mouth 4 (four) times daily -  before meals and at bedtime.   DIVIGEL 0.25 MG/0.25GM Gel Generic drug:  Estradiol Apply 1 application topically daily. Thigh area   Estradiol 10 MCG Tabs vaginal tablet   hydrOXYzine 10 MG tablet Commonly known as:  ATARAX/VISTARIL Take 1 tablet (10 mg total) by mouth 3 (three) times daily as needed.   ketoconazole 2 % shampoo Commonly known as:  NIZORAL Apply 1 application topically 2 (two) times a week. To affected areas   levothyroxine 125 MCG tablet Commonly known as:  SYNTHROID, LEVOTHROID TAKE 1 TABLET DAILY   LOTRIMIN AF EX Apply topically.   mometasone-formoterol 200-5 MCG/ACT Aero Commonly known as:  DULERA Inhale 2 puffs into the lungs 2 (two) times daily.   montelukast 10 MG tablet Commonly known as:  SINGULAIR Take 1 tablet (10 mg total) by mouth at bedtime.   multivitamin with minerals tablet Take 1 tablet by mouth daily.   nystatin-triamcinolone cream Commonly known as:  MYCOLOG II   omeprazole 20 MG capsule Commonly known as:  PRILOSEC TAKE 1 CAPSULE DAILY   PAZEO 0.7 % Soln Generic drug:  Olopatadine HCl Place 1 drop into both eyes daily as needed.   propranolol ER 60 MG 24 hr capsule Commonly known as:  INDERAL LA TAKE 1 CAPSULE DAILY   ranitidine 300 MG capsule Commonly known as:  ZANTAC Take 1 capsule (300 mg total) by mouth every  evening.   rizatriptan 10 MG tablet Commonly known as:  MAXALT Take 1 tablet (10 mg total) by mouth as needed for migraine. Reported on 08/12/2015   topiramate 100 MG tablet Commonly known as:  TOPAMAX TAKE 100 MG IN THE MORNING AND 100 MG AT BEDTIME   zolpidem 10 MG tablet Commonly known as:  AMBIEN Take 1 tablet (10 mg total) by mouth at bedtime as needed. for sleep       Past Medical History:  Diagnosis Date  . Allergic rhinitis   . Asthma   . Depression   . Goiter   . Goiter   . [id number](478.2)     Past Surgical  History:  Procedure Laterality Date  . ABDOMINAL HYSTERECTOMY    . CHOLECYSTECTOMY N/A 11/02/2013   Procedure: LAPAROSCOPIC CHOLECYSTECTOMY ;  Surgeon: Cherylynn Ridges, MD;  Location: [facility] OR;  Service: General;  Laterality: N/A;  . KNEE SURGERY    . THYROIDECTOMY      Review of systems negative except as noted in HPI / PMHx or noted below:  Review of Systems  Constitutional: Negative.   HENT: Negative.   Eyes: Negative.   Respiratory: Negative.   Cardiovascular: Negative.   Gastrointestinal: Negative.   Genitourinary: Negative.   Musculoskeletal: Negative.   Skin: Negative.   Neurological: Negative.   Endo/Heme/Allergies: Negative.   Psychiatric/Behavioral: Negative.      Objective:   Vitals:   03/14/18 1534  BP: 124/82  Pulse: 84  Resp: 20   Height: 5' 4.5" (163.8 cm)  Weight: 208 lb (94.3 kg)   Physical Exam  Constitutional:  Raspy voice, throat clearing  HENT:  Head: Normocephalic.  Right Ear: Tympanic membrane, external ear and ear canal normal.  Left Ear: Tympanic membrane, external ear and ear canal normal.  Nose: Nose normal. No mucosal edema or rhinorrhea.  Mouth/Throat: Uvula is midline, oropharynx is clear and moist and mucous membranes are normal. No oropharyngeal exudate.  Eyes: Conjunctivae are normal.  Neck: Trachea normal. No tracheal tenderness present. No tracheal deviation present. No thyromegaly present.  Cardiovascular: Normal rate, regular rhythm, S1 normal, S2 normal and normal heart sounds.  No murmur heard. Pulmonary/Chest: Breath sounds normal. No stridor. No respiratory distress. She has no wheezes (end expiratory wheezes posterior lung fields bilaterally). She has no rales.  Musculoskeletal: She exhibits no edema.  Lymphadenopathy:       Head (right side): No tonsillar adenopathy present.       Head (left side): No tonsillar adenopathy present.    She has no cervical adenopathy.  Neurological: She is alert.  Skin: No rash noted. She is  not diaphoretic. No erythema. Nails show no clubbing.    Diagnostics:    Spirometry was performed and demonstrated an FEV1 of 1.97 at 99 % of predicted.  The patient had an Asthma Control Test with the following results:  .    Assessment and Plan:   1. Not well controlled moderate persistent asthma   2. Other allergic rhinitis   3. Perennial allergic conjunctivitis of both eyes   4. LPRD (laryngopharyngeal reflux disease)   5. Eosinophilia   6. Pruritic disorder     1. Perform Allergen avoidance measures - DUST MITE  2.  Continue to treat and prevent inflammation:   A. Dulera 200 - 2 inhalations 2 times per day with spacer. samples  B. OTC Nasacort - one spray each nostril one time per day  C. Singulair 10 mg - one tablet one time per  day  3.  Continue to treat and prevent reflux:   A. omeprazole to 40 mg in a.m.  B. ranitidine 300 mg in PM  4. If needed:   A. Antihistamine - Atarax 10mg  - one tablet every 8 hours if needed  B. ProAir HFA or similar - 2 inhalations every 4-6 hours  C. Pazeo - one drop each eye one time per day  5.  Consider starting a course of immunotherapy  6.  Evaluation of throat with ENT  7.  Return to clinic December 2019 or earlier if problem   We will once again have Madelynn restart anti-inflammatory agents for her lower airway in the form of Dulera with clinic samples.  She will continue on montelukast at this point as well as use some dose of nasal steroid.  In addition we will keep her on omeprazole and ranitidine to treat her LPR but I would also like her throat to be looked at by ENT to make sure were not dealing with any other significant issue contributing to the symptoms.  She would definitely be a candidate for immunotherapy and I once again encouraged her to consider this form of treatment and also to perform allergen avoidance measures against dust mite.  If she does well I will see her in this clinic in December 2019 or earlier if there  is a problem.  Laurette Schimke, MD Allergy / Immunology [facility]

## 2018-03-14 NOTE — Patient Instructions (Addendum)
  1. Perform Allergen avoidance measures - DUST MITE  2.  Continue to treat and prevent inflammation:   A. Dulera 200 - 2 inhalations 2 times per day with spacer. samples  B. OTC Nasacort - one spray each nostril one time per day  C. Singulair 10 mg - one tablet one time per day  3.  Continue to treat and prevent reflux:   A. omeprazole to 40 mg in a.m.  B. ranitidine 300 mg in PM  4. If needed:   A. Antihistamine - Atarax 10mg  - one tablet every 8 hours if needed  B. ProAir HFA or similar - 2 inhalations every 4-6 hours  C. Pazeo - one drop each eye one time per day  5.  Consider starting a course of immunotherapy  6.  Evaluation of throat with ENT  7.  Return to clinic December 2019 or earlier if problem

## 2018-03-14 NOTE — Telephone Encounter (Signed)
Evaluation of throat with ENT per Dr Lucie Leather please refer patient

## 2018-03-15 ENCOUNTER — Encounter: Payer: Self-pay | Admitting: Allergy and Immunology

## 2018-03-15 MED ORDER — MOMETASONE FURO-FORMOTEROL FUM 200-5 MCG/ACT IN AERO: 2.0000 | INHALATION_SPRAY | Freq: Two times a day (BID) | RESPIRATORY_TRACT | 1 refills | Status: DC

## 2018-03-15 NOTE — Addendum Note (Signed)
Addended by: Mliss Fritz I on: 03/15/2018 08:10 AM   Modules accepted: Orders

## 2018-03-16 NOTE — Telephone Encounter (Signed)
Referral has been faxed to [facility]. Will follow back up in a few days.

## 2018-03-30 ENCOUNTER — Telehealth: Payer: Self-pay | Admitting: Allergy and Immunology

## 2018-03-30 NOTE — Telephone Encounter (Signed)
Patient thinks she needs a new spacer Patient cant remember if she got her spacer from her pharmacy or [facility] Please call

## 2018-03-30 NOTE — Telephone Encounter (Signed)
Called patient and informed them that spacer will be up front for them to pick up. Informed patient that they will need to sign the aeroflow sheet upon pickup so that we can file the spacer with her insurance. Patient demonstrated understanding.

## 2018-04-15 ENCOUNTER — Other Ambulatory Visit: Payer: Self-pay | Admitting: Family Medicine

## 2018-04-19 ENCOUNTER — Other Ambulatory Visit: Payer: Self-pay | Admitting: *Deleted

## 2018-04-19 ENCOUNTER — Other Ambulatory Visit: Payer: Self-pay | Admitting: Family Medicine

## 2018-04-19 NOTE — Telephone Encounter (Signed)
Received fax requesting refill on Ambien to mail order.   Ok to refill??  Last office visit 02/20/2018.  Last refill 01/23/2018.

## 2018-04-20 ENCOUNTER — Other Ambulatory Visit: Payer: Self-pay | Admitting: *Deleted

## 2018-04-20 MED ORDER — RANITIDINE HCL 300 MG PO CAPS: ORAL_CAPSULE | ORAL | 1 refills | Status: DC

## 2018-04-20 MED ORDER — ZOLPIDEM TARTRATE 10 MG PO TABS: 10.0000 mg | ORAL_TABLET | Freq: Every evening | ORAL | 0 refills | Status: DC | PRN

## 2018-04-20 MED ORDER — HYDROXYZINE HCL 10 MG PO TABS: ORAL_TABLET | ORAL | 1 refills | Status: DC

## 2018-04-21 ENCOUNTER — Other Ambulatory Visit: Payer: Self-pay | Admitting: *Deleted

## 2018-04-21 MED ORDER — TOPIRAMATE 100 MG PO TABS: ORAL_TABLET | ORAL | 3 refills | Status: DC

## 2018-05-20 ENCOUNTER — Other Ambulatory Visit: Payer: Self-pay | Admitting: Family Medicine

## 2018-05-20 DIAGNOSIS — K580 Irritable bowel syndrome with diarrhea: Secondary | ICD-10-CM

## 2018-05-23 ENCOUNTER — Ambulatory Visit: Payer: Medicare Other | Admitting: Allergy and Immunology

## 2018-07-04 ENCOUNTER — Ambulatory Visit: Payer: Medicare Other | Admitting: Allergy and Immunology

## 2018-07-05 ENCOUNTER — Ambulatory Visit (INDEPENDENT_AMBULATORY_CARE_PROVIDER_SITE_OTHER): Payer: Medicare Other | Admitting: Family Medicine

## 2018-07-05 ENCOUNTER — Encounter: Payer: Self-pay | Admitting: Family Medicine

## 2018-07-05 VITALS — BP 122/80 | HR 81 | Temp 98.8°F | Resp 16 | Ht 64.0 in | Wt 214.1 lb

## 2018-07-05 DIAGNOSIS — J329 Chronic sinusitis, unspecified: Secondary | ICD-10-CM | POA: Diagnosis not present

## 2018-07-05 DIAGNOSIS — J310 Chronic rhinitis: Secondary | ICD-10-CM

## 2018-07-05 DIAGNOSIS — J45901 Unspecified asthma with (acute) exacerbation: Secondary | ICD-10-CM

## 2018-07-05 MED ORDER — DOXYCYCLINE HYCLATE 100 MG PO TABS: 100.0000 mg | ORAL_TABLET | Freq: Two times a day (BID) | ORAL | 0 refills | Status: DC

## 2018-07-05 MED ORDER — PREDNISONE 20 MG PO TABS: ORAL_TABLET | ORAL | 0 refills | Status: DC

## 2018-07-05 MED ORDER — BENZONATATE 100 MG PO CAPS: 100.0000 mg | ORAL_CAPSULE | Freq: Three times a day (TID) | ORAL | 0 refills | Status: DC | PRN

## 2018-07-05 MED ORDER — ALBUTEROL SULFATE HFA 108 (90 BASE) MCG/ACT IN AERS: 2.0000 | INHALATION_SPRAY | RESPIRATORY_TRACT | 0 refills | Status: DC | PRN

## 2018-07-05 NOTE — Progress Notes (Signed)
Patient ID: Claudius SisNorma A [place], female    DOB: 06/14/1950, 68 y.o.   MRN: [id number]  PCP: Salley Scarleturham, Kawanta F, MD  Chief Complaint  Patient presents with  . Cough    Patient in with c/o productive cough, sob, nasal congestion. Onset 1 week.    Subjective:   Claudius Sisorma A Bushey is a 68 y.o. female, presents to clinic with CC of uri, cough and asthma exacerbation. Cough  This is a new problem. The current episode started in the past 7 days. The problem has been gradually worsening. The cough is productive of blood-tinged sputum. Associated symptoms include nasal congestion, postnasal drip, rhinorrhea, a sore throat, shortness of breath and wheezing. Pertinent negatives include no chest pain, chills, ear congestion, ear pain, fever, headaches, myalgias, rash, sweats or weight loss. Her past medical history is significant for asthma. There is no history of bronchitis, COPD, emphysema or pneumonia.  Asthma  She complains of chest tightness, cough, difficulty breathing, frequent throat clearing, hoarse voice, shortness of breath, sputum production and wheezing. This is a recurrent problem. The current episode started in the past 7 days. The problem has been gradually worsening. Associated symptoms include dyspnea on exertion, malaise/fatigue (mild), nasal congestion, postnasal drip, rhinorrhea, sneezing and a sore throat. Pertinent negatives include no appetite change, chest pain, ear congestion, ear pain, fever, headaches, myalgias, orthopnea, PND, sweats or weight loss. Her symptoms are aggravated by URI and climbing stairs. She reports minimal improvement on treatment. Her past medical history is significant for asthma. There is no history of bronchitis, COPD, emphysema or pneumonia.    1 weeks uri sx, yesterday got much worse yesterday Coughing severely, productive sputum, nose congested and runny, throat scratchy  Last exacerbation that she can remember was 8-9 months ago.    No sick contacts No  hx of pneumonia  SABA - not sure if its working - tried a few times 2x and helped a little  No nebulizer at home Sputum is green sometimes with faint red streaks + sweats not changed from menopause sweats,  SOB with cough and wheeze, SOB with exertion, she doesn't want to go up steps, she feels tired Chest sore from coughing, breathing hurts a little     Patient Active Problem List   Diagnosis Date Noted  . Allergic conjunctivitis 09/13/2016  . MDD (major depressive disorder) 09/13/2016  . Tinea versicolor 07/06/2016  . IBS (irritable bowel syndrome) 01/02/2016  . Hypothyroidism 10/17/2015  . Postop check 11/27/2013  . Gallbladder calculus with acute cholecystitis and no obstruction 11/02/2013  . Migraine 11/27/2012  . Insomnia 07/21/2009  . GOITER, SUBSTERNAL 01/19/2008  . Allergic rhinitis 01/19/2008  . Asthma, allergic 01/19/2008     Prior to Admission medications   Medication Sig Start Date End Date Taking? Authorizing Provider  albuterol (PROAIR HFA) 108 (90 Base) MCG/ACT inhaler Inhale 2 puffs into the lungs every 4 (four) hours as needed for wheezing or shortness of breath. 03/14/18  Yes Kozlow, Alvira PhilipsEric J, MD  buPROPion (WELLBUTRIN XL) 300 MG 24 hr tablet TAKE 1 TABLET DAILY (DOSE CHANGE) 01/27/18  Yes [place], Velna HatchetKawanta F, MD  Calcium Carbonate-Vitamin D (CALCIUM 600+D) 600-400 MG-UNIT per tablet Take 1 tablet by mouth 2 (two) times daily.    Yes [provider]  Clotrimazole (LOTRIMIN AF EX) Apply topically.   Yes [provider]  dicyclomine (BENTYL) 20 MG tablet TAKE 1 TABLET FOUR TIMES A DAY BEFORE MEALS AND AT BEDTIME 05/22/18  Yes [place], Velna HatchetKawanta F, MD  DIVIGEL 0.25 MG/0.25GM GEL Apply 1 application topically daily. Thigh area 07/01/15  Yes [provider]  Estradiol 10 MCG TABS vaginal tablet  12/15/17  Yes [provider]  hydrOXYzine (ATARAX/VISTARIL) 10 MG tablet Take one tablet twice daily if needed 04/20/18  Yes Kozlow, Alvira Philips, MD    levothyroxine (SYNTHROID, LEVOTHROID) 125 MCG tablet TAKE 1 TABLET DAILY 04/19/18  Yes [place], Velna Hatchet, MD  mometasone-formoterol [facility]) 200-5 MCG/ACT AERO Inhale 2 puffs into the lungs 2 (two) times daily. 03/15/18  Yes Kozlow, Alvira Philips, MD  montelukast (SINGULAIR) 10 MG tablet Take 1 tablet (10 mg total) by mouth at bedtime. 03/14/18  Yes Kozlow, Alvira Philips, MD  Multiple Vitamins-Minerals (MULTIVITAMIN WITH MINERALS) tablet Take 1 tablet by mouth daily.   Yes [provider]  nystatin-triamcinolone (MYCOLOG II) cream  02/17/18  Yes [provider]  PAZEO 0.7 % SOLN Place 1 drop into both eyes daily as needed. 05/19/17  Yes Kozlow, Alvira Philips, MD  propranolol ER (INDERAL LA) 60 MG 24 hr capsule TAKE 1 CAPSULE DAILY 04/18/18  Yes [place], Velna Hatchet, MD  rizatriptan (MAXALT) 10 MG tablet Take 1 tablet (10 mg total) by mouth as needed for migraine. Reported on 08/12/2015 09/16/17  Yes [place], Velna Hatchet, MD  Spacer/Aero-Holding Chambers (AEROCHAMBER PLUS) inhaler Use as instructed 05/02/17  Yes Kozlow, Alvira Philips, MD  topiramate (TOPAMAX) 100 MG tablet TAKE 50 MG IN THE MORNING AND 100 MG AT BEDTIME 04/21/18  Yes [place], Velna Hatchet, MD  zolpidem (AMBIEN) 10 MG tablet Take 1 tablet (10 mg total) by mouth at bedtime as needed. for sleep 04/20/18  Yes Donita Brooks, MD  ketoconazole (NIZORAL) 2 % shampoo Apply 1 application topically 2 (two) times a week. To affected areas Patient not taking: Reported on 07/05/2018 09/15/17   Salley Scarlet, MD  omeprazole (PRILOSEC) 20 MG capsule TAKE 1 CAPSULE DAILY Patient not taking: Reported on 07/05/2018 04/18/18   Salley Scarlet, MD  ranitidine (ZANTAC) 300 MG capsule Take one tablet once daily Patient not taking: Reported on 07/05/2018 04/20/18   Jessica Priest, MD     Allergies  Allergen Reactions  . Sulfamethoxazole-Trimethoprim Swelling  . Metronidazole Itching and Rash     Family History  Problem Relation Age of Onset  . Allergies Mother    . ALS Father   . Prostate cancer Father      Social History   Socioeconomic History  . Marital status: Married    Spouse name: [name]  . Number of children: 2  . Years of education: college  . Highest education level: Not on file  Occupational History  . Occupation: housewife  Social Needs  . Financial resource strain: Not on file  . Food insecurity:    Worry: Not on file    Inability: Not on file  . Transportation needs:    Medical: Not on file    Non-medical: Not on file  Tobacco Use  . Smoking status: Passive Smoke Exposure - Never Smoker  . Smokeless tobacco: Never Used  . Tobacco comment: husband smoking cigars  Substance and Sexual Activity  . Alcohol use: Yes    Comment: occ  . Drug use: No  . Sexual activity: Not Currently  Lifestyle  . Physical activity:    Days per week: Not on file    Minutes per session: Not on file  . Stress: Not on file  Relationships  . Social connections:    Talks on phone: Not  on file    Gets together: Not on file    Attends religious service: Not on file    Active member of club or organization: Not on file    Attends meetings of clubs or organizations: Not on file    Relationship status: Not on file  . Intimate partner violence:    Fear of current or ex partner: Not on file    Emotionally abused: Not on file    Physically abused: Not on file    Forced sexual activity: Not on file  Other Topics Concern  . Not on file  Social History Narrative   Patient lives at home with her husband [name]).   Retired.   Education college B.S.   Right handed.   Caffeine one cup of coffee daily.     Review of Systems  Constitutional: Positive for malaise/fatigue (mild). Negative for appetite change, chills, fever and weight loss.  HENT: Positive for hoarse voice, postnasal drip, rhinorrhea, sneezing and sore throat. Negative for ear pain.   Eyes: Negative.   Respiratory: Positive for cough, sputum production, shortness of breath and  wheezing.   Cardiovascular: Positive for dyspnea on exertion. Negative for chest pain and PND.  Gastrointestinal: Negative.   Endocrine: Negative.   Genitourinary: Negative.   Musculoskeletal: Negative.  Negative for myalgias.  Skin: Negative.  Negative for rash.  Allergic/Immunologic: Negative.   Neurological: Negative.  Negative for headaches.  Hematological: Negative.   Psychiatric/Behavioral: Negative.   All other systems reviewed and are negative.      Objective:    Vitals:   07/05/18 1529  BP: 122/80  Pulse: 81  Resp: 16  Temp: 98.8 F (37.1 C)  TempSrc: Oral  SpO2: 98%  Weight: 214 lb 2 oz (97.1 kg)  Height: 5\' 4"  (1.626 m)      Physical Exam Vitals signs and nursing note reviewed.  Constitutional:      General: She is not in acute distress.    Appearance: She is well-developed. She is not ill-appearing, toxic-appearing or diaphoretic.  HENT:     Head: Normocephalic and atraumatic.     Right Ear: Hearing, tympanic membrane, ear canal and external ear normal.     Left Ear: Hearing, tympanic membrane, ear canal and external ear normal.     Nose: Mucosal edema, congestion and rhinorrhea present.     Right Sinus: No maxillary sinus tenderness or frontal sinus tenderness.     Left Sinus: No maxillary sinus tenderness or frontal sinus tenderness.     Mouth/Throat:     Mouth: Mucous membranes are not pale.     Pharynx: Uvula midline. No oropharyngeal exudate or uvula swelling.     Tonsils: No tonsillar abscesses.  Eyes:     General:        Right eye: No discharge.        Left eye: No discharge.     Conjunctiva/sclera: Conjunctivae normal.     Pupils: Pupils are equal, round, and reactive to light.  Neck:     Musculoskeletal: Normal range of motion and neck supple.     Trachea: No tracheal deviation.  Cardiovascular:     Rate and Rhythm: Normal rate and regular rhythm.     Pulses: Normal pulses.     Heart sounds: Normal heart sounds.  Pulmonary:      Effort: Pulmonary effort is normal. No respiratory distress.     Breath sounds: No stridor. Wheezing present. No rhonchi or rales.  Chest:  Chest wall: No tenderness.  Abdominal:     General: Bowel sounds are normal. There is no distension.     Palpations: Abdomen is soft.  Musculoskeletal: Normal range of motion.  Skin:    General: Skin is warm and dry.     Coloration: Skin is not pale.     Findings: No rash.  Neurological:     Mental Status: She is alert.     Motor: No abnormal muscle tone.     Coordination: Coordination normal.  Psychiatric:        Behavior: Behavior normal.           Assessment & Plan:      ICD-10-CM   1. Exacerbation of persistent asthma, unspecified asthma severity J45.901    steroid burst - pt says 5 d does not work for her, 10 day taper given, avoid triggers, use albuterol Q4-6 hours, f/up if not improving  2. Rhinosinusitis J32.9    URI - suspect viral illness, worsened yesterday, but no sinus ttp or fever, wait a few more days to see if it will resolve, if worsening start doxy       Danelle BerryLeisa Taeya Theall, PA-C 07/05/18 3:40 PM

## 2018-07-06 ENCOUNTER — Other Ambulatory Visit: Payer: Self-pay | Admitting: Allergy and Immunology

## 2018-07-13 ENCOUNTER — Other Ambulatory Visit: Payer: Self-pay | Admitting: *Deleted

## 2018-07-13 MED ORDER — ZOLPIDEM TARTRATE 10 MG PO TABS: 10.0000 mg | ORAL_TABLET | Freq: Every evening | ORAL | 0 refills | Status: DC | PRN

## 2018-07-13 NOTE — Telephone Encounter (Signed)
Received fax requesting refill on Ambien to mail order.   Ok to refill??  Last office visit 07/05/2018.  Last refill 04/20/2018 to mail order.

## 2018-07-18 ENCOUNTER — Telehealth: Payer: Self-pay | Admitting: Family Medicine

## 2018-07-18 NOTE — Telephone Encounter (Signed)
Its nearly 2 weeks later, the pt needs to be rechecked/reexamined in office - as per the instruction on her AVS.

## 2018-07-18 NOTE — Telephone Encounter (Signed)
Patient called in stating that she was told to give [place] an update on how she was feeling after completing the course of prednisone that was prescribed to her on 07/05/2018. Patient states that she is feeling somewhat better but she is still experiencing some SOB, wheezing, congestion. States cough is somewhat better but feels that she may need an additional round of prednisone. Please advise?

## 2018-07-18 NOTE — Telephone Encounter (Signed)
Spoke with patient and informed her that she will need an office visit. Patient to come in tomorrow.

## 2018-07-19 ENCOUNTER — Ambulatory Visit (INDEPENDENT_AMBULATORY_CARE_PROVIDER_SITE_OTHER): Payer: Medicare Other | Admitting: Family Medicine

## 2018-07-19 ENCOUNTER — Encounter: Payer: Self-pay | Admitting: Family Medicine

## 2018-07-19 VITALS — BP 120/82 | HR 80 | Temp 97.8°F | Resp 16 | Ht 64.0 in | Wt 213.1 lb

## 2018-07-19 DIAGNOSIS — Z9119 Patient's noncompliance with other medical treatment and regimen: Secondary | ICD-10-CM | POA: Diagnosis not present

## 2018-07-19 DIAGNOSIS — R059 Cough, unspecified: Secondary | ICD-10-CM

## 2018-07-19 DIAGNOSIS — R05 Cough: Secondary | ICD-10-CM | POA: Diagnosis not present

## 2018-07-19 DIAGNOSIS — Z91199 Patient's noncompliance with other medical treatment and regimen due to unspecified reason: Secondary | ICD-10-CM

## 2018-07-19 DIAGNOSIS — J45901 Unspecified asthma with (acute) exacerbation: Secondary | ICD-10-CM

## 2018-07-19 DIAGNOSIS — J069 Acute upper respiratory infection, unspecified: Secondary | ICD-10-CM | POA: Diagnosis not present

## 2018-07-19 DIAGNOSIS — K219 Gastro-esophageal reflux disease without esophagitis: Secondary | ICD-10-CM | POA: Diagnosis not present

## 2018-07-19 NOTE — Patient Instructions (Addendum)
Need to take the dulera - or comparable - TAKE every day as prescribed, you will not feel an immediate difference.  Rinse your mouth out and spit.  Use your rescue inhaler as needed for shortness of breath - can use every few hours when you are sick like this - it only lasts for 2 hours in your body.  Need to use mucinex - can try a lower dose or regular dose and [facility]  Follow up ASAP with pulmonology with concerns over the inhalers or management.  There is a chest x-ray ordered that you can go to [facility] to get done if after you have done all your meds correctly, if you continue to have cough and congestion you can get the xray and it will come to me - would be making sure there is no pnuemonia.  If the breathing is not improved with inhalers and you keep getting short of breath, wheezy or reactive with good compliance - then follow up with the pulmonologist to adjust management

## 2018-07-19 NOTE — Progress Notes (Signed)
Patient ID: Claudius SisNorma A Mcphillips, female    DOB: 02/22/1951, 68 y.o.   MRN: [id number]  PCP: Salley Scarleturham, Kawanta F, MD  Chief Complaint  Patient presents with  . Cough    Patient in for a follow up for asthma exacerbation. Patient states that she is still wheezing, and coughing.  . Wheezing    Subjective:   Claudius Sisorma A Laino is a 68 y.o. female, presents to clinic with CC of continued cough, wheeze and SOB, was seen 2 weeks ago treated for sinus infection and asthma exacerbation, acute bronchitis. She did steroids and she felt better for a while on steroids, but she noticed that if she was in rooms where he smoked cigars that will trigger her asthma.  She did not start the antibiotics right away she waited to see if it would get better on its own and after it did not she started taking the doxycycline.  She did not take Mucinex and she is not using her dulera inhaler that she was given by pulmonology because she "can't feel it" - explains she doesn't feel it shooting/misting into her mouth.  She was given a Passenger transport managerspacer/aerochamber.  She has not used it for almost 2 weeks.  She did not call the pulmonologist to discuss her concerns which we talked about 2 weeks ago, she did not schedule a follow-up with him.    Cough- she stopped taking tessalon and complains she is still coughing, dry cough.  Feels a little congested or tight in her upper central chest to her throat. Sinus pain/pressure - got a lot better, but still some frontal sinus soreness, and HA to temples, she says its triggering migraines, but she also has been holding her migraine medication on purpose because she was afraid of drug interactions. Doxy - she started taking doxy about 1.5 weeks ago - after she was done with steroids, she took maybe 5 or 6 days worth -it was giving her some GI upset so she stopped the medication.    Patient Active Problem List   Diagnosis Date Noted  . Allergic conjunctivitis 09/13/2016  . MDD (major depressive  disorder) 09/13/2016  . Tinea versicolor 07/06/2016  . IBS (irritable bowel syndrome) 01/02/2016  . Hypothyroidism 10/17/2015  . Postop check 11/27/2013  . Gallbladder calculus with acute cholecystitis and no obstruction 11/02/2013  . Migraine 11/27/2012  . Insomnia 07/21/2009  . GOITER, SUBSTERNAL 01/19/2008  . Allergic rhinitis 01/19/2008  . Asthma, allergic 01/19/2008     Prior to Admission medications   Medication Sig Start Date End Date Taking? Authorizing Provider  albuterol (PROAIR HFA) 108 (90 Base) MCG/ACT inhaler Inhale 2 puffs into the lungs every 4 (four) hours as needed for wheezing or shortness of breath. 03/14/18  Yes Kozlow, Alvira PhilipsEric J, MD  buPROPion (WELLBUTRIN XL) 300 MG 24 hr tablet TAKE 1 TABLET DAILY (DOSE CHANGE) 01/27/18  Yes [place], Velna HatchetKawanta F, MD  Calcium Carbonate-Vitamin D (CALCIUM 600+D) 600-400 MG-UNIT per tablet Take 1 tablet by mouth 2 (two) times daily.    Yes [provider]  Clotrimazole (LOTRIMIN AF EX) Apply topically.   Yes [provider]  dicyclomine (BENTYL) 20 MG tablet TAKE 1 TABLET FOUR TIMES A DAY BEFORE MEALS AND AT BEDTIME 05/22/18  Yes [place], Velna HatchetKawanta F, MD  DIVIGEL 0.25 MG/0.25GM GEL Apply 1 application topically daily. Thigh area 07/01/15  Yes [provider]  Estradiol 10 MCG TABS vaginal tablet  12/15/17  Yes [provider]  hydrOXYzine (ATARAX/VISTARIL) 10 MG  tablet Take one tablet twice daily if needed 04/20/18  Yes Kozlow, Alvira Philips, MD  ketoconazole (NIZORAL) 2 % shampoo Apply 1 application topically 2 (two) times a week. To affected areas 09/15/17  Yes [place], Velna Hatchet, MD  levothyroxine (SYNTHROID, LEVOTHROID) 125 MCG tablet TAKE 1 TABLET DAILY 04/19/18  Yes [place], Velna Hatchet, MD  montelukast (SINGULAIR) 10 MG tablet Take 1 tablet (10 mg total) by mouth at bedtime. 03/14/18  Yes Kozlow, Alvira Philips, MD  Multiple Vitamins-Minerals (MULTIVITAMIN WITH MINERALS) tablet Take 1 tablet by mouth daily.   Yes [provider]  nystatin-triamcinolone (MYCOLOG II) cream  02/17/18  Yes [provider]  omeprazole (PRILOSEC) 20 MG capsule TAKE 1 CAPSULE DAILY 04/18/18  Yes [place], Velna Hatchet, MD  PAZEO 0.7 % SOLN INSTILL 1 DROP IN BOTH EYES DAILY AS NEEDED 07/06/18  Yes Kozlow, Alvira Philips, MD  rizatriptan (MAXALT) 10 MG tablet Take 1 tablet (10 mg total) by mouth as needed for migraine. Reported on 08/12/2015 09/16/17  Yes [place], Velna Hatchet, MD  Spacer/Aero-Holding Chambers (AEROCHAMBER PLUS) inhaler Use as instructed 05/02/17  Yes Kozlow, Alvira Philips, MD  topiramate (TOPAMAX) 100 MG tablet TAKE 50 MG IN THE MORNING AND 100 MG AT BEDTIME 04/21/18  Yes [place], Velna Hatchet, MD  zolpidem (AMBIEN) 10 MG tablet Take 1 tablet (10 mg total) by mouth at bedtime as needed. for sleep 07/13/18  Yes [place], Velna Hatchet, MD  albuterol (PROVENTIL HFA;VENTOLIN HFA) 108 (90 Base) MCG/ACT inhaler Inhale 2 puffs into the lungs every 4 (four) hours as needed for wheezing or shortness of breath. Patient not taking: Reported on 07/19/2018 07/05/18   Danelle Berry, PA-C  benzonatate (TESSALON) 100 MG capsule Take 1 capsule (100 mg total) by mouth 3 (three) times daily as needed for cough. Patient not taking: Reported on 07/19/2018 07/05/18   Danelle Berry, PA-C  doxycycline (VIBRA-TABS) 100 MG tablet Take 1 tablet (100 mg total) by mouth 2 (two) times daily. Patient not taking: Reported on 07/19/2018 07/05/18   Danelle Berry, PA-C  mometasone-formoterol (DULERA) 200-5 MCG/ACT AERO Inhale 2 puffs into the lungs 2 (two) times daily. Patient not taking: Reported on 07/19/2018 03/15/18   Jessica Priest, MD  propranolol ER [facility] [place]) 60 MG 24 hr capsule TAKE 1 CAPSULE DAILY 04/18/18   Salley Scarlet, MD  ranitidine (ZANTAC) 300 MG capsule Take one tablet once daily Patient not taking: Reported on 07/05/2018 04/20/18   Kozlow, Alvira Philips, MD     Allergies  Allergen Reactions  . Sulfamethoxazole-Trimethoprim Swelling  . Metronidazole Itching and Rash      Family History  Problem Relation Age of Onset  . Allergies Mother   . ALS Father   . Prostate cancer Father      Social History   Socioeconomic History  . Marital status: Married    Spouse name: [name]  . Number of children: 2  . Years of education: college  . Highest education level: Not on file  Occupational History  . Occupation: housewife  Social Needs  . Financial resource strain: Not on file  . Food insecurity:    Worry: Not on file    Inability: Not on file  . Transportation needs:    Medical: Not on file    Non-medical: Not on file  Tobacco Use  . Smoking status: Passive Smoke Exposure - Never Smoker  . Smokeless tobacco: Never Used  . Tobacco comment: husband smoking cigars  Substance and Sexual Activity  . Alcohol  use: Yes    Comment: occ  . Drug use: No  . Sexual activity: Not Currently  Lifestyle  . Physical activity:    Days per week: Not on file    Minutes per session: Not on file  . Stress: Not on file  Relationships  . Social connections:    Talks on phone: Not on file    Gets together: Not on file    Attends religious service: Not on file    Active member of club or organization: Not on file    Attends meetings of clubs or organizations: Not on file    Relationship status: Not on file  . Intimate partner violence:    Fear of current or ex partner: Not on file    Emotionally abused: Not on file    Physically abused: Not on file    Forced sexual activity: Not on file  Other Topics Concern  . Not on file  Social History Narrative   Patient lives at home with her husband [name](Alan).   Retired.   Education college B.S.   Right handed.   Caffeine one cup of coffee daily.     Review of Systems  Constitutional: Negative.   HENT: Negative.   Eyes: Negative.   Respiratory: Negative.   Cardiovascular: Negative.   Gastrointestinal: Negative.   Endocrine: Negative.   Genitourinary: Negative.   Musculoskeletal: Negative.   Skin: Negative.    Allergic/Immunologic: Negative.   Neurological: Negative.   Hematological: Negative.   Psychiatric/Behavioral: Negative.   All other systems reviewed and are negative.      Objective:    Vitals:   07/19/18 1525  BP: 120/82  Pulse: 80  Resp: 16  Temp: 97.8 F (36.6 C)  TempSrc: Oral  SpO2: 98%  Weight: 213 lb 2 oz (96.7 kg)  Height: 5\' 4"  (1.626 m)      Physical Exam Vitals signs and nursing note reviewed.  Constitutional:      General: She is not in acute distress.    Appearance: She is well-developed. She is obese. She is not ill-appearing, toxic-appearing or diaphoretic.  HENT:     Head: Normocephalic and atraumatic.     Right Ear: Hearing, tympanic membrane, ear canal and external ear normal.     Left Ear: Hearing, tympanic membrane, ear canal and external ear normal.     Nose: Mucosal edema present. No congestion or rhinorrhea.     Right Sinus: No maxillary sinus tenderness or frontal sinus tenderness.     Left Sinus: No maxillary sinus tenderness or frontal sinus tenderness.     Mouth/Throat:     Mouth: Mucous membranes are moist. Mucous membranes are not pale.     Pharynx: Oropharynx is clear. Uvula midline. No oropharyngeal exudate, posterior oropharyngeal erythema or uvula swelling.     Tonsils: No tonsillar abscesses.  Eyes:     General: No scleral icterus.       Right eye: No discharge.        Left eye: No discharge.     Conjunctiva/sclera: Conjunctivae normal.     Pupils: Pupils are equal, round, and reactive to light.  Neck:     Musculoskeletal: Normal range of motion and neck supple.     Trachea: No tracheal deviation.  Cardiovascular:     Rate and Rhythm: Normal rate and regular rhythm.     Pulses: Normal pulses.     Heart sounds: Normal heart sounds. No murmur. No friction rub. No gallop.  Pulmonary:     Effort: Pulmonary effort is normal. No respiratory distress.     Breath sounds: Normal breath sounds. No stridor. No wheezing, rhonchi or  rales.  Abdominal:     General: Bowel sounds are normal. There is no distension.     Palpations: Abdomen is soft.     Tenderness: There is no abdominal tenderness.  Musculoskeletal: Normal range of motion.  Skin:    General: Skin is warm and dry.     Capillary Refill: Capillary refill takes less than 2 seconds.     Coloration: Skin is not pale.     Findings: No rash.  Neurological:     Mental Status: She is alert.     Motor: No abnormal muscle tone.     Coordination: Coordination normal.  Psychiatric:        Behavior: Behavior normal.           Assessment & Plan:      ICD-10-CM   1. Exacerbation of persistent asthma, unspecified asthma severity J45.901    non-compliant with maintenance inhaler  2. Gastroesophageal reflux disease, esophagitis presence not specified K21.9    Pt instructed to use pepcid with protonix to help prevent irritation when taking steroids and doxy - only on ppi  3. Upper respiratory tract infection, unspecified type J06.9    URI/sinusitis - she appears significantly improved in her sinuses, no erythema, discharge and congestion better  4. Cough R05 DG Chest 2 View   likely still mild URI sx, postnasal drip, and asthma/bronchospams - mucinex, use maintenance and SABA  5. Medically noncompliant Z91.19    with asthma meds, with migraine meds - most of her complaints are related to dx she has that she is not taking meds for      Pt appears much better, nasal mucosa on exam is much better, her lungs are clear, her duration of symptoms with acute bronchitis and with her noncompliance to her asthma medications is not surprising encouraged her to use Mucinex, start using her Dulera as prescribed, use Saba PRN for shortness of breath, wheeze, coughing fits.    GERD -was noncompliant with the plan likely a little irritated from doxycycline, encouraged her to use Pepcid as needed and continue Protonix  Other various complaints today about her migraines even  though she has been taking her medicine and complains about constipation even though she says she is dealt with it chronically for a long time and when I offered MiraLAX sample she said she her stools are soft right now.  I encouraged her to take the medications prescribed for her diagnoses, if she is concerned about med interactions I encouraged her to discuss this with her pharmacist to reassure her, and come back to discuss these other diagnoses or symptoms if they are still uncontrolled or acutely worse.  She holds the med list in her hands, this visit and last, and she wants to asked multiple questions talk about the diagnoses and talk about her symptoms for each medicine on her list and I explained to her that we cannot go through all of those in 15 minute office visits.   Encouraged her to continue to educate herself about her dx and meds, check with her pharmacist, but at some point she is going to have to trust that the doctors and PAs that she is seeing trust that they are prescribing medicines that will help her and trust that we are not intending to harm her at all.  Explained  to her that most of her concerns and thing she is complaining about are likely worse and she is more symptomatic because she is noncompliant with the medications.    Danelle Berry, PA-C 07/19/18 3:34 PM

## 2018-07-20 ENCOUNTER — Encounter: Payer: Self-pay | Admitting: Family Medicine

## 2018-08-08 ENCOUNTER — Encounter: Payer: Self-pay | Admitting: Allergy and Immunology

## 2018-08-08 ENCOUNTER — Ambulatory Visit (INDEPENDENT_AMBULATORY_CARE_PROVIDER_SITE_OTHER): Payer: Medicare Other | Admitting: Allergy and Immunology

## 2018-08-08 VITALS — BP 124/80 | HR 79 | Resp 16

## 2018-08-08 DIAGNOSIS — K219 Gastro-esophageal reflux disease without esophagitis: Secondary | ICD-10-CM | POA: Diagnosis not present

## 2018-08-08 DIAGNOSIS — H1045 Other chronic allergic conjunctivitis: Secondary | ICD-10-CM

## 2018-08-08 DIAGNOSIS — J4550 Severe persistent asthma, uncomplicated: Secondary | ICD-10-CM | POA: Diagnosis not present

## 2018-08-08 DIAGNOSIS — L299 Pruritus, unspecified: Secondary | ICD-10-CM

## 2018-08-08 DIAGNOSIS — D721 Eosinophilia, unspecified: Secondary | ICD-10-CM

## 2018-08-08 DIAGNOSIS — J3089 Other allergic rhinitis: Secondary | ICD-10-CM | POA: Diagnosis not present

## 2018-08-08 DIAGNOSIS — H1013 Acute atopic conjunctivitis, bilateral: Secondary | ICD-10-CM

## 2018-08-08 NOTE — Progress Notes (Signed)
[place] [facility] [place] [facility] [place] [facility] [place] [facility] [place]   Follow-up Note  Referring Provider: Kirby Funk, MD Primary Provider: Salley Scarlet, MD Date of Office Visit: 08/08/2018  Subjective:   Brooke Schneider (DOB: [birth year]) is a 68 y.o. female who [facility] in re-evaluation of the following:  HPI: Brooke Schneider returns to this clinic in reevaluation of asthma and allergic rhinoconjunctivitis and LPR and a history of eosinophilia and pruritic disorder.  Her last visit to this clinic was 14 March 2018.  Concerning her respiratory tract she really did quite well and did not require systemic steroid or an antibiotic and rarely used a short acting bronchodilator where she continued to use Dulera and Singulair and occasionally Nasacort.  Unfortunately, she developed a viral respiratory tract infection at the end of January that did require the administration of prednisone and antibiotics and Tessalon Per[place].  She had about 2 weeks of these symptoms and they for the most part are entirely gone.  She thinks that her reflux is under good control.  If she misses her ranitidine she does develop this "chest discomfort".  As long she stays on omeprazole and ranitidine on a consistent basis she does quite well.  She did have evaluation of her throat with ENT and apparently everything was "okay".  She still has intermittently itchy and intermittently uses Atarax.  There are point in time when she can go a week or 2 without any Atarax.  When she was sick during her viral respiratory tract infection she apparently did have a little bit more problems with itchiness.  Her husband is now smoking cigars.  Fortunately, he appears to be smoking for the most part outdoors but she can still smell the cigar smoke and she thinks that is irritating her airway a little and causing her to be a little bit hoarse.  Allergies as of 08/08/2018      Reactions   Sulfamethoxazole-trimethoprim Swelling   Metronidazole Itching, Rash      Medication List      AEROCHAMBER PLUS inhaler Use as instructed   albuterol 108 (90 Base) MCG/ACT inhaler Commonly known as:  PROAIR HFA Inhale 2 puffs into the lungs every 4 (four) hours as needed for wheezing or shortness of breath.   albuterol 108 (90 Base) MCG/ACT inhaler Commonly known as:  PROVENTIL HFA;VENTOLIN HFA Inhale 2 puffs into the lungs every 4 (four) hours as needed for wheezing or shortness of breath.   benzonatate 100 MG capsule Commonly known as:  TESSALON Take 1 capsule (100 mg total) by mouth 3 (three) times daily as needed for cough.   buPROPion 300 MG 24 hr tablet Commonly known as:  WELLBUTRIN XL TAKE 1 TABLET DAILY (DOSE CHANGE)   CALCIUM 600+D 600-400 MG-UNIT tablet Generic drug:  Calcium Carbonate-Vitamin D Take 1 tablet by mouth 2 (two) times daily.   dicyclomine 20 MG tablet Commonly known as:  BENTYL TAKE 1 TABLET FOUR TIMES A DAY BEFORE MEALS AND AT BEDTIME   DIVIGEL 0.25 MG/0.25GM Gel Generic drug:  Estradiol Apply 1 application topically daily. Thigh area   hydrOXYzine 10 MG tablet Commonly known as:  ATARAX/VISTARIL Take one tablet twice daily if needed   ketoconazole 2 % shampoo Commonly known as:  NIZORAL Apply 1 application topically 2 (two) times a week. To affected areas   levothyroxine 125 MCG tablet Commonly known as:  SYNTHROID, LEVOTHROID TAKE 1 TABLET DAILY   LOTRIMIN AF EX  Apply topically.   mometasone-formoterol 200-5 MCG/ACT Aero Commonly known as:  DULERA Inhale 2 puffs into the lungs 2 (two) times daily.   montelukast 10 MG tablet Commonly known as:  SINGULAIR Take 1 tablet (10 mg total) by mouth at bedtime.   multivitamin with minerals tablet Take 1 tablet by mouth daily.   nystatin-triamcinolone cream Commonly known as:  MYCOLOG II   omeprazole 20 MG capsule Commonly known as:  PRILOSEC TAKE 1 CAPSULE DAILY   PAZEO 0.7 %  Soln Generic drug:  Olopatadine HCl INSTILL 1 DROP IN BOTH EYES DAILY AS NEEDED   propranolol ER 60 MG 24 hr capsule Commonly known as:  INDERAL LA TAKE 1 CAPSULE DAILY   ranitidine 300 MG capsule Commonly known as:  ZANTAC Take one tablet once daily   rizatriptan 10 MG tablet Commonly known as:  MAXALT Take 1 tablet (10 mg total) by mouth as needed for migraine. Reported on 08/12/2015   topiramate 100 MG tablet Commonly known as:  TOPAMAX TAKE 50 MG IN THE MORNING AND 100 MG AT BEDTIME   zolpidem 10 MG tablet Commonly known as:  AMBIEN Take 1 tablet (10 mg total) by mouth at bedtime as needed. for sleep       Past Medical History:  Diagnosis Date  . Allergic rhinitis   . Asthma   . Depression   . Goiter   . Goiter   . ZOXWRUEA(540.9)     Past Surgical History:  Procedure Laterality Date  . ABDOMINAL HYSTERECTOMY    . CHOLECYSTECTOMY N/A 11/02/2013   Procedure: LAPAROSCOPIC CHOLECYSTECTOMY ;  Surgeon: Cherylynn Ridges, MD;  Location: [facility] OR;  Service: General;  Laterality: N/A;  . KNEE SURGERY    . THYROIDECTOMY      Review of systems negative except as noted in HPI / PMHx or noted below:  Review of Systems  Constitutional: Negative.   HENT: Negative.   Eyes: Negative.   Respiratory: Negative.   Cardiovascular: Negative.   Gastrointestinal: Negative.   Genitourinary: Negative.   Musculoskeletal: Negative.   Skin: Negative.   Neurological: Negative.   Endo/Heme/Allergies: Negative.   Psychiatric/Behavioral: Negative.      Objective:   Vitals:   08/08/18 1616  BP: 124/80  Pulse: 79  Resp: 16  SpO2: 96%          Physical Exam Constitutional:      Appearance: She is not diaphoretic.  HENT:     Head: Normocephalic.     Right Ear: Tympanic membrane, ear canal and external ear normal.     Left Ear: Tympanic membrane, ear canal and external ear normal.     Nose: Nose normal. No mucosal edema or rhinorrhea.     Mouth/Throat:     Pharynx: Uvula  midline. No oropharyngeal exudate.  Eyes:     Conjunctiva/sclera: Conjunctivae normal.  Neck:     Thyroid: No thyromegaly.     Trachea: Trachea normal. No tracheal tenderness or tracheal deviation.  Cardiovascular:     Rate and Rhythm: Normal rate and regular rhythm.     Heart sounds: Normal heart sounds, S1 normal and S2 normal. No murmur.  Pulmonary:     Effort: No respiratory distress.     Breath sounds: Normal breath sounds. No stridor. No wheezing or rales.  Lymphadenopathy:     Head:     Right side of head: No tonsillar adenopathy.     Left side of head: No tonsillar adenopathy.     Cervical:  No cervical adenopathy.  Skin:    Findings: No erythema or rash.     Nails: There is no clubbing.   Neurological:     Mental Status: She is alert.     Diagnostics:    Spirometry was performed and demonstrated an FEV1 of 2.22 at 111 % of predicted.  The patient had an Asthma Control Test with the following results: ACT Total Score: 9.    Assessment and Plan:   1. Asthma, severe persistent, well-controlled   2. Other allergic rhinitis   3. Perennial allergic conjunctivitis of both eyes   4. LPRD (laryngopharyngeal reflux disease)   5. Eosinophilia   6. Pruritic disorder     1. Continue to treat and prevent inflammation:   A. Dulera 200 - 2 inhalations 2 times per day with spacer. samples  B. OTC Nasacort - one spray each nostril 1-7 times a week  C. Singulair 10 mg - one tablet one time per day  3.  Continue to treat and prevent reflux:   A. omeprazole 40 mg in a.m.  B. ranitidine 300 mg in PM  4. If needed:   A. Antihistamine - Atarax 10mg  - one tablet every 8 hours if needed  B. ProAir HFA or similar - 2 inhalations every 4-6 hours  C. Pazeo - one drop each eye one time per day  5.  Return to clinic summer 2020 or earlier if problem   Overall Brooke Schneider appears to be doing very well although she did have an exacerbation of asthma requiring a systemic steroid in  January 2020 but it did appear to be triggered by a viral respiratory tract infection.  Overall she has done well with a large collection of medical therapy directed against respiratory tract inflammation.  We will see how this springtime pollination season goes in regard to her respiratory tract over the course of the past several months while utilizing her preventative plan.  Her reflux also appears to be under good control and there is obviously a requirement for not just a proton pump inhibitor but an H2 receptor blocker as she becomes symptomatic if she misses 1 of these agents.  I will see her back in this clinic in the summer 2020 or earlier if there is a problem.  I have had a discussion with her in the past about considering immunotherapy as she is very atopic.  At this point she is not very interested in undergoing this form of treatment.  Brooke Schimke, MD Allergy / Immunology  [facility]

## 2018-08-08 NOTE — Patient Instructions (Addendum)
  1. Continue to treat and prevent inflammation:   A. Dulera 200 - 2 inhalations 2 times per day with spacer. samples  B. OTC Nasacort - one spray each nostril 1-7 times a week  C. Singulair 10 mg - one tablet one time per day  3.  Continue to treat and prevent reflux:   A. omeprazole 40 mg in a.m.  B. ranitidine 300 mg in PM  4. If needed:   A. Antihistamine - Atarax 10mg  - one tablet every 8 hours if needed  B. ProAir HFA or similar - 2 inhalations every 4-6 hours  C. Pazeo - one drop each eye one time per day  5.  Return to clinic summer 2020 or earlier if problem

## 2018-08-09 ENCOUNTER — Encounter: Payer: Self-pay | Admitting: Allergy and Immunology

## 2018-08-21 DIAGNOSIS — J019 Acute sinusitis, unspecified: Secondary | ICD-10-CM | POA: Diagnosis not present

## 2018-08-21 DIAGNOSIS — R05 Cough: Secondary | ICD-10-CM | POA: Diagnosis not present

## 2018-09-11 ENCOUNTER — Telehealth: Payer: Self-pay

## 2018-09-11 MED ORDER — FAMOTIDINE 40 MG PO TABS: ORAL_TABLET | ORAL | 5 refills | Status: DC

## 2018-09-11 NOTE — Telephone Encounter (Signed)
Famotidine 40 mg daily

## 2018-09-11 NOTE — Telephone Encounter (Signed)
Prescription sent

## 2018-09-11 NOTE — Telephone Encounter (Signed)
Dr. Lucie Leather, we received a fax from patients pharmacy regarding her prescription for ranitidine. Pharmacy is wanting to know since it is no longer available, if you want to send in famotidine? If so, what mg and frequency?

## 2018-09-19 ENCOUNTER — Other Ambulatory Visit: Payer: Self-pay

## 2018-09-19 MED ORDER — FAMOTIDINE 20 MG PO TABS: ORAL_TABLET | ORAL | 4 refills | Status: DC

## 2018-10-02 ENCOUNTER — Other Ambulatory Visit: Payer: Self-pay | Admitting: Family Medicine

## 2018-10-19 DIAGNOSIS — Z1231 Encounter for screening mammogram for malignant neoplasm of breast: Secondary | ICD-10-CM | POA: Diagnosis not present

## 2018-10-19 DIAGNOSIS — N760 Acute vaginitis: Secondary | ICD-10-CM | POA: Diagnosis not present

## 2018-10-19 DIAGNOSIS — Z124 Encounter for screening for malignant neoplasm of cervix: Secondary | ICD-10-CM | POA: Diagnosis not present

## 2018-10-19 DIAGNOSIS — Z6835 Body mass index (BMI) 35.0-35.9, adult: Secondary | ICD-10-CM | POA: Diagnosis not present

## 2018-10-24 LAB — HM PAP SMEAR: HM Pap smear: NEGATIVE

## 2018-11-01 ENCOUNTER — Other Ambulatory Visit: Payer: Self-pay | Admitting: Family Medicine

## 2018-11-01 MED ORDER — ZOLPIDEM TARTRATE 10 MG PO TABS: 10.0000 mg | ORAL_TABLET | Freq: Every evening | ORAL | 1 refills | Status: DC | PRN

## 2018-11-01 NOTE — Telephone Encounter (Signed)
Requesting refill    Ambien  LOV: 07/19/18  LRF:  07/13/18

## 2018-11-14 ENCOUNTER — Encounter: Payer: Self-pay | Admitting: Allergy and Immunology

## 2018-11-14 ENCOUNTER — Ambulatory Visit (INDEPENDENT_AMBULATORY_CARE_PROVIDER_SITE_OTHER): Payer: Medicare Other | Admitting: Allergy and Immunology

## 2018-11-14 ENCOUNTER — Other Ambulatory Visit: Payer: Self-pay

## 2018-11-14 VITALS — BP 110/70 | HR 82 | Resp 16

## 2018-11-14 DIAGNOSIS — H1013 Acute atopic conjunctivitis, bilateral: Secondary | ICD-10-CM

## 2018-11-14 DIAGNOSIS — D721 Eosinophilia, unspecified: Secondary | ICD-10-CM

## 2018-11-14 DIAGNOSIS — L299 Pruritus, unspecified: Secondary | ICD-10-CM | POA: Diagnosis not present

## 2018-11-14 DIAGNOSIS — J3089 Other allergic rhinitis: Secondary | ICD-10-CM

## 2018-11-14 DIAGNOSIS — K219 Gastro-esophageal reflux disease without esophagitis: Secondary | ICD-10-CM | POA: Diagnosis not present

## 2018-11-14 DIAGNOSIS — J4550 Severe persistent asthma, uncomplicated: Secondary | ICD-10-CM | POA: Diagnosis not present

## 2018-11-14 DIAGNOSIS — H1045 Other chronic allergic conjunctivitis: Secondary | ICD-10-CM | POA: Diagnosis not present

## 2018-11-14 MED ORDER — FAMOTIDINE 40 MG PO TABS: 40.0000 mg | ORAL_TABLET | Freq: Every day | ORAL | 5 refills | Status: DC

## 2018-11-14 MED ORDER — HYDROXYZINE HCL 10 MG PO TABS: 10.0000 mg | ORAL_TABLET | Freq: Three times a day (TID) | ORAL | 0 refills | Status: DC | PRN

## 2018-11-14 MED ORDER — MONTELUKAST SODIUM 10 MG PO TABS: 10.0000 mg | ORAL_TABLET | Freq: Every day | ORAL | 5 refills | Status: DC

## 2018-11-14 MED ORDER — ALBUTEROL SULFATE HFA 108 (90 BASE) MCG/ACT IN AERS: 2.0000 | INHALATION_SPRAY | Freq: Four times a day (QID) | RESPIRATORY_TRACT | 2 refills | Status: DC | PRN

## 2018-11-14 NOTE — Patient Instructions (Addendum)
  1. Continue to treat and prevent inflammation:   A. Dulera 200 - 2 inhalations 2 times per day with spacer. samples  B. OTC Nasacort - one spray each nostril 1-7 times a week  C. Singulair 10 mg - one tablet one time per day  2.  Continue to treat and prevent reflux:   A. omeprazole 40 mg in a.m.  B.  Famotidine 40 mg in PM  3. If needed:   A. Antihistamine - Atarax 10mg  - one tablet every 8 hours if needed  B. ProAir HFA or similar - 2 inhalations every 4-6 hours  C. Pazeo - one drop each eye one time per day  4.  Return to clinic 6 months or earlier if problem  5.  Obtain full flu vaccine

## 2018-11-14 NOTE — Progress Notes (Signed)
[place]   Follow-up Note  Referring Provider: Jeanice Limurham, Velna HatchetKawanta F, MD Primary Provider: Salley Scarleturham, Kawanta F, MD Date of Office Visit: 11/14/2018  Subjective:   Brooke Schneider (DOB: 01/04/1951) is a [age] y.o. female who [facility] in re-evaluation of the following:  HPI: [name] returns to this clinic in reevaluation of asthma and allergic rhinoconjunctivitis and LPR and eosinophilia and pruritic disorder.  I last saw her in this clinic on 08 August 2018.  Her airway has really been doing quite well and she has not required a systemic steroid or antibiotic to treat any type of airway issue.  Rarely does she use a short acting bronchodilator.  She continues to use Dulera mostly 1 time per day and continues on Singulair and rarely uses any nasal steroid.  She was able to go through the entire spring without much difficulty.  Her reflux is under pretty good control at this point in time on her current plan.  She has had very little issues with her throat.  It should be noted that she still continues to drink about 1 gallon of sweet tea per day.  On occasion she develops itchiness of her skin.  She must use Atarax about 2-3 times per week.  She never has any associated systemic or constitutional symptoms.  Allergies as of 11/14/2018      Reactions   Sulfamethoxazole-trimethoprim Swelling   Metronidazole Itching, Rash      Medication List      AeroChamber Plus inhaler Use as instructed   albuterol 108 (90 Base) MCG/ACT inhaler Commonly known as:  ProAir HFA Inhale 2 puffs into the lungs every 4 (four) hours as needed for wheezing or shortness of breath.   albuterol 108 (90 Base) MCG/ACT inhaler Commonly known as:  VENTOLIN HFA Inhale 2 puffs into the lungs every 4 (four) hours as needed for wheezing or shortness of breath.   buPROPion 300 MG 24 hr tablet Commonly known as:  WELLBUTRIN XL TAKE 1  TABLET DAILY (DOSE CHANGE)   Calcium 600+D 600-400 MG-UNIT tablet Generic drug:  Calcium Carbonate-Vitamin D Take 1 tablet by mouth 2 (two) times daily.   dicyclomine 20 MG tablet Commonly known as:  BENTYL TAKE 1 TABLET FOUR TIMES A DAY BEFORE MEALS AND AT BEDTIME   Divigel 0.25 MG/0.25GM Gel Generic drug:  Estradiol Apply 1 application topically daily. Thigh area   famotidine 20 MG tablet Commonly known as:  PEPCID Take two tablets (40mg  total) at bedtime as directed.   hydrOXYzine 10 MG tablet Commonly known as:  ATARAX/VISTARIL Take one tablet twice daily if needed   levothyroxine 125 MCG tablet Commonly known as:  SYNTHROID TAKE 1 TABLET DAILY   LOTRIMIN AF EX Apply topically.   mometasone 50 MCG/ACT nasal spray Commonly known as:  NASONEX Place 2 sprays into the nose daily.   mometasone-formoterol 200-5 MCG/ACT Aero Commonly known as:  Dulera Inhale 2 puffs into the lungs 2 (two) times daily.   montelukast 10 MG tablet Commonly known as:  SINGULAIR TAKE 1 TABLET AT BEDTIME   multivitamin with minerals tablet Take 1 tablet by mouth daily.   nystatin-triamcinolone cream Commonly known as:  MYCOLOG II   omeprazole 20 MG capsule Commonly known as:  PRILOSEC TAKE 1 CAPSULE DAILY   Pazeo 0.7 % Soln Generic drug:  Olopatadine HCl INSTILL 1 DROP IN BOTH EYES DAILY AS NEEDED   propranolol  ER 60 MG 24 hr capsule Commonly known as:  INDERAL LA TAKE 1 CAPSULE DAILY   rizatriptan 10 MG tablet Commonly known as:  MAXALT Take 1 tablet (10 mg total) by mouth as needed for migraine. Reported on 08/12/2015   topiramate 100 MG tablet Commonly known as:  TOPAMAX TAKE 50 MG IN THE MORNING AND 100 MG AT BEDTIME   zolpidem 10 MG tablet Commonly known as:  AMBIEN Take 1 tablet (10 mg total) by mouth at bedtime as needed. for sleep       Past Medical History:  Diagnosis Date  . Allergic rhinitis   . Asthma   . Depression   . Goiter   . Goiter   .  [id number](353.2)     Past Surgical History:  Procedure Laterality Date  . ABDOMINAL HYSTERECTOMY    . CHOLECYSTECTOMY N/A 11/02/2013   Procedure: LAPAROSCOPIC CHOLECYSTECTOMY ;  Surgeon: Gwenyth Ober, MD;  Location: [place];  Service: General;  Laterality: N/A;  . KNEE SURGERY    . THYROIDECTOMY      Review of systems negative except as noted in HPI / PMHx or noted below:  Review of Systems  Constitutional: Negative.   HENT: Negative.   Eyes: Negative.   Respiratory: Negative.   Cardiovascular: Negative.   Gastrointestinal: Negative.   Genitourinary: Negative.   Musculoskeletal: Negative.   Skin: Negative.   Neurological: Negative.   Endo/Heme/Allergies: Negative.   Psychiatric/Behavioral: Negative.      Objective:   Vitals:   11/14/18 1523  BP: 110/70  Pulse: 82  Resp: 16  SpO2: 95%          Physical Exam Constitutional:      Appearance: She is not diaphoretic.  HENT:     Head: Normocephalic.     Right Ear: Tympanic membrane, ear canal and external ear normal.     Left Ear: Tympanic membrane, ear canal and external ear normal.     Nose: Nose normal. No mucosal edema or rhinorrhea.     Mouth/Throat:     Pharynx: Uvula midline. No oropharyngeal exudate.  Eyes:     Conjunctiva/sclera: Conjunctivae normal.  Neck:     Thyroid: No thyromegaly.     Trachea: Trachea normal. No tracheal tenderness or tracheal deviation.  Cardiovascular:     Rate and Rhythm: Normal rate and regular rhythm.     Heart sounds: Normal heart sounds, S1 normal and S2 normal. No murmur.  Pulmonary:     Effort: No respiratory distress.     Breath sounds: Normal breath sounds. No stridor. No wheezing or rales.  Lymphadenopathy:     Head:     Right side of head: No tonsillar adenopathy.     Left side of head: No tonsillar adenopathy.     Cervical: No cervical adenopathy.  Skin:    Findings: No erythema or rash.     Nails: There is no clubbing.   Neurological:     Mental Status: She  is alert.     Diagnostics:    Spirometry was performed and demonstrated an FEV1 of 1.97 at 99 % of predicted.   Assessment and Plan:   1. Asthma, severe persistent, well-controlled   2. Other allergic rhinitis   3. Perennial allergic conjunctivitis of both eyes   4. LPRD (laryngopharyngeal reflux disease)   5. Eosinophilia   6. Pruritic disorder     1. Continue to treat and prevent inflammation:   A. Dulera 200 - 2 inhalations 2 times  per day with spacer. samples  B. OTC Nasacort - one spray each nostril 1-7 times a week  C. Singulair 10 mg - one tablet one time per day  2.  Continue to treat and prevent reflux:   A.  omeprazole 40 mg in a.m.  B.  Famotidine 40 mg in PM  3. If needed:   A. Antihistamine - Atarax 10mg  - one tablet every 8 hours if needed  B. ProAir HFA or similar - 2 inhalations every 4-6 hours  C. Pazeo - one drop each eye one time per day  4.  Return to clinic 6 months or earlier if problem  5.  Obtain full flu vaccine   [name] really appears to be doing quite well on her current plan.  She has a pretty good understanding of her disease state and how her medications work and appropriate dosing of her medications.  She will continue to use therapy directed against respiratory tract inflammation and reflux as noted above.  I will see her back in this clinic in 6 months or earlier if there is a problem.  Laurette SchimkeEric Kozlow, MD Allergy / Immunology [facility]

## 2018-11-15 ENCOUNTER — Encounter: Payer: Self-pay | Admitting: Allergy and Immunology

## 2018-12-22 ENCOUNTER — Other Ambulatory Visit: Payer: Self-pay

## 2018-12-25 ENCOUNTER — Encounter: Payer: Self-pay | Admitting: Family Medicine

## 2018-12-25 ENCOUNTER — Ambulatory Visit (INDEPENDENT_AMBULATORY_CARE_PROVIDER_SITE_OTHER): Payer: Medicare Other | Admitting: Family Medicine

## 2018-12-25 ENCOUNTER — Other Ambulatory Visit: Payer: Self-pay

## 2018-12-25 VITALS — BP 126/68 | HR 82 | Temp 98.7°F | Resp 16 | Ht 64.0 in | Wt 211.0 lb

## 2018-12-25 DIAGNOSIS — Z87828 Personal history of other (healed) physical injury and trauma: Secondary | ICD-10-CM

## 2018-12-25 DIAGNOSIS — M1711 Unilateral primary osteoarthritis, right knee: Secondary | ICD-10-CM | POA: Diagnosis not present

## 2018-12-25 MED ORDER — OMEPRAZOLE 20 MG PO CPDR: 20.0000 mg | DELAYED_RELEASE_CAPSULE | Freq: Every day | ORAL | 3 refills | Status: DC

## 2018-12-25 MED ORDER — TRAMADOL-ACETAMINOPHEN 37.5-325 MG PO TABS: 1.0000 | ORAL_TABLET | Freq: Three times a day (TID) | ORAL | 0 refills | Status: DC | PRN

## 2018-12-25 NOTE — Addendum Note (Signed)
Addended by: Vic Blackbird F on: 12/25/2018 02:08 PM   Modules accepted: Orders

## 2018-12-25 NOTE — Patient Instructions (Signed)
F/U Mid Sept for Wellness/Physical Referral to orthopedics Ultracet for pain

## 2018-12-25 NOTE — Progress Notes (Signed)
   Subjective:    Patient ID: Brooke Schneider, female    DOB: 10-29-1950, 68 y.o.   MRN: [id number]  Patient presents for R Knee Pain (x months)  Right knee pain has been worsening past few months, hears a crunching sound and it feels weaker. Difficulty bending or stooping.  No swelling of knee   July 2013 Xray of right knee- showed tricompartmental joint disease.   She did have old injury in 2003 to the knee - she had partially torn ACL and tear of meniscus, at that time had Physical therapy, she had arthroscopy at that time   Last seen by orthopedics in 2018 had steroid injection at that time     Review Of Systems:  GEN- denies fatigue, fever, weight loss,weakness, recent illness HEENT- denies eye drainage, change in vision, nasal discharge, CVS- denies chest pain, palpitations RESP- denies SOB, cough, wheeze ABD- denies N/V, change in stools, abd pain GU- denies dysuria, hematuria, dribbling, incontinence MSK-+ joint pain, muscle aches, injury Neuro- denies headache, dizziness, syncope, seizure activity       Objective:    BP 126/68   Pulse 82   Temp 98.7 F (37.1 C) (Oral)   Resp 16   Ht 5\' 4"  (1.626 m)   Wt 211 lb (95.7 kg)   SpO2 98%   BMI 36.22 kg/m  GEN- NAD, alert and oriented x3 CVS- RRR, no murmur RESP-CTAB MSK- normal inspection bilat, healed scars on right knee, no effusion, crepitus bilat, fair ROM bilat, fair ROM Hips, antalgic gait  EXT- No edema Pulses- Radial, DP- 2+        Assessment & Plan:      Problem List Items Addressed This Visit    None    Visit Diagnoses    Primary osteoarthritis of right knee    -  Primary   Worsening DJD/OA knee, known previous meniscal tear has had steroid injection in past, likely completely worn out,may need replacement, ortho referral, ultracet prn pain Pt wants to return to orthopedist that did arthroscopy in [facility]   Relevant Medications   traMADol-acetaminophen (ULTRACET) 37.5-325 MG tablet   Other Relevant Orders   Ambulatory referral to Orthopedic Surgery   History of meniscal tear       Relevant Orders   Ambulatory referral to Orthopedic Surgery      Note: This dictation was prepared with Dragon dictation along with smaller phrase technology. Any transcriptional errors that result from this process are unintentional.

## 2019-01-02 DIAGNOSIS — M1711 Unilateral primary osteoarthritis, right knee: Secondary | ICD-10-CM | POA: Diagnosis not present

## 2019-01-02 DIAGNOSIS — M25561 Pain in right knee: Secondary | ICD-10-CM | POA: Diagnosis not present

## 2019-01-23 DIAGNOSIS — M25561 Pain in right knee: Secondary | ICD-10-CM | POA: Diagnosis not present

## 2019-01-23 DIAGNOSIS — M1711 Unilateral primary osteoarthritis, right knee: Secondary | ICD-10-CM | POA: Diagnosis not present

## 2019-02-15 ENCOUNTER — Ambulatory Visit: Payer: Medicare Other | Admitting: Neurology

## 2019-02-16 ENCOUNTER — Other Ambulatory Visit: Payer: Self-pay

## 2019-02-19 ENCOUNTER — Other Ambulatory Visit: Payer: Self-pay

## 2019-02-19 ENCOUNTER — Ambulatory Visit (INDEPENDENT_AMBULATORY_CARE_PROVIDER_SITE_OTHER): Payer: Medicare Other | Admitting: Family Medicine

## 2019-02-19 ENCOUNTER — Encounter: Payer: Self-pay | Admitting: Family Medicine

## 2019-02-19 VITALS — BP 128/68 | HR 78 | Temp 98.3°F | Resp 16 | Ht 64.0 in | Wt 209.0 lb

## 2019-02-19 DIAGNOSIS — J0110 Acute frontal sinusitis, unspecified: Secondary | ICD-10-CM | POA: Diagnosis not present

## 2019-02-19 DIAGNOSIS — G43909 Migraine, unspecified, not intractable, without status migrainosus: Secondary | ICD-10-CM

## 2019-02-19 DIAGNOSIS — K580 Irritable bowel syndrome with diarrhea: Secondary | ICD-10-CM

## 2019-02-19 MED ORDER — PROPRANOLOL HCL ER 60 MG PO CP24: 60.0000 mg | ORAL_CAPSULE | Freq: Every day | ORAL | 4 refills | Status: DC

## 2019-02-19 MED ORDER — TOPIRAMATE 100 MG PO TABS: ORAL_TABLET | ORAL | 3 refills | Status: DC

## 2019-02-19 MED ORDER — ALBUTEROL SULFATE HFA 108 (90 BASE) MCG/ACT IN AERS: 2.0000 | INHALATION_SPRAY | RESPIRATORY_TRACT | 2 refills | Status: DC | PRN

## 2019-02-19 MED ORDER — PAZEO 0.7 % OP SOLN: 1.0000 [drp] | Freq: Every day | OPHTHALMIC | 1 refills | Status: DC | PRN

## 2019-02-19 MED ORDER — BUPROPION HCL ER (XL) 300 MG PO TB24: ORAL_TABLET | ORAL | 4 refills | Status: DC

## 2019-02-19 MED ORDER — DICYCLOMINE HCL 20 MG PO TABS: ORAL_TABLET | ORAL | 15 refills | Status: DC

## 2019-02-19 MED ORDER — LEVOTHYROXINE SODIUM 125 MCG PO TABS: 125.0000 ug | ORAL_TABLET | Freq: Every day | ORAL | 4 refills | Status: DC

## 2019-02-19 MED ORDER — HYDROXYZINE HCL 10 MG PO TABS: ORAL_TABLET | ORAL | 1 refills | Status: DC

## 2019-02-19 MED ORDER — AMOXICILLIN 875 MG PO TABS: 875.0000 mg | ORAL_TABLET | Freq: Two times a day (BID) | ORAL | 0 refills | Status: DC

## 2019-02-19 MED ORDER — RIZATRIPTAN BENZOATE 10 MG PO TABS: 10.0000 mg | ORAL_TABLET | ORAL | 2 refills | Status: DC | PRN

## 2019-02-19 NOTE — Progress Notes (Signed)
   Subjective:    Patient ID: Brooke Schneider, female    DOB: 1950/12/19, 68 y.o.   MRN: [id number]  Patient presents for Sinus Pressure (x weeks- states that she has pressure in forehead, behind eyes, ears)     Pt here with sinus pressure and pain/drainage  for past few weeks. Mostly in frontal sinus region and behind her yes, feels foggy headed.   No cough or fever  She is using her allergy medications - singulair,   Dulera for asthma  She has been out of maxalt for monhts but does have topamax           Review Of Systems:  GEN- denies fatigue, fever, weight loss,weakness, recent illness HEENT- denies eye drainage, change in vision, +nasal discharge, CVS- denies chest pain, palpitations RESP- denies SOB, cough, wheeze ABD- denies N/V, change in stools, abd pain GU- denies dysuria, hematuria, dribbling, incontinence MSK- denies joint pain, muscle aches, injury Neuro- denies headache, dizziness, syncope, seizure activity       Objective:    BP 128/68   Pulse 78   Temp 98.3 F (36.8 C) (Oral)   Resp 16   Ht 5\' 4"  (1.626 m)   Wt 209 lb (94.8 kg)   SpO2 98%   BMI 35.87 kg/m  GEN- NAD, alert and oriented x3 HEENT- PERRL, EOMI, non injected sclera, pink conjunctiva, MMM, oropharynx clear  TM clear bilat no effusion,  + maxillary/frontal  sinus tenderness, inflammed turbinates,  Nasal drainage  Neck- Supple, no LAD CVS- RRR, no murmur RESP-CTAB NEURO-CNII-XII in tact no deficits  EXT- No edema Pulses- Radial 2+         Assessment & Plan:      Problem List Items Addressed This Visit      Unprioritized   Migraine    Migraine headache along with some sinusitis.  The sinuses self could have triggered her migraines.  I refilled her Maxalt which she uses for abortive treatment.  She is to continue the Topamax.  We will treat her sinusitis with the addition of Flonase nasal saline and some amoxicillin.  She can continue all her other allergy and asthma  medications.      Relevant Medications   topiramate (TOPAMAX) 100 MG tablet   rizatriptan (MAXALT) 10 MG tablet    Other Visit Diagnoses    Acute frontal sinusitis, recurrence not specified    -  Primary   Relevant Medications   amoxicillin (AMOXIL) 875 MG tablet      Note: This dictation was prepared with Dragon dictation along with smaller phrase technology. Any transcriptional errors that result from this process are unintentional.

## 2019-02-19 NOTE — Assessment & Plan Note (Signed)
Migraine headache along with some sinusitis.  The sinuses self could have triggered her migraines.  I refilled her Maxalt which she uses for abortive treatment.  She is to continue the Topamax.  We will treat her sinusitis with the addition of Flonase nasal saline and some amoxicillin.  She can continue all her other allergy and asthma medications.

## 2019-02-19 NOTE — Patient Instructions (Addendum)
Continue flonase  Take  antibiotics  F/U as previous for Physical

## 2019-02-26 ENCOUNTER — Encounter: Payer: Medicare Other | Admitting: Physician Assistant

## 2019-02-28 DIAGNOSIS — H04123 Dry eye syndrome of bilateral lacrimal glands: Secondary | ICD-10-CM | POA: Diagnosis not present

## 2019-02-28 DIAGNOSIS — H25813 Combined forms of age-related cataract, bilateral: Secondary | ICD-10-CM | POA: Diagnosis not present

## 2019-02-28 DIAGNOSIS — H1045 Other chronic allergic conjunctivitis: Secondary | ICD-10-CM | POA: Diagnosis not present

## 2019-03-13 ENCOUNTER — Ambulatory Visit (INDEPENDENT_AMBULATORY_CARE_PROVIDER_SITE_OTHER): Payer: Medicare Other | Admitting: Family Medicine

## 2019-03-13 ENCOUNTER — Encounter: Payer: Self-pay | Admitting: Family Medicine

## 2019-03-13 VITALS — BP 130/64 | HR 70 | Temp 98.1°F | Resp 16 | Ht 64.0 in | Wt 210.0 lb

## 2019-03-13 DIAGNOSIS — Z0001 Encounter for general adult medical examination with abnormal findings: Secondary | ICD-10-CM

## 2019-03-13 DIAGNOSIS — G43909 Migraine, unspecified, not intractable, without status migrainosus: Secondary | ICD-10-CM | POA: Diagnosis not present

## 2019-03-13 DIAGNOSIS — E669 Obesity, unspecified: Secondary | ICD-10-CM | POA: Insufficient documentation

## 2019-03-13 DIAGNOSIS — Z23 Encounter for immunization: Secondary | ICD-10-CM | POA: Diagnosis not present

## 2019-03-13 DIAGNOSIS — F5101 Primary insomnia: Secondary | ICD-10-CM

## 2019-03-13 DIAGNOSIS — E039 Hypothyroidism, unspecified: Secondary | ICD-10-CM

## 2019-03-13 DIAGNOSIS — F331 Major depressive disorder, recurrent, moderate: Secondary | ICD-10-CM | POA: Diagnosis not present

## 2019-03-13 DIAGNOSIS — Z0000 Encounter for general adult medical examination without abnormal findings: Secondary | ICD-10-CM

## 2019-03-13 MED ORDER — TRAZODONE HCL 50 MG PO TABS: 25.0000 mg | ORAL_TABLET | Freq: Every evening | ORAL | 3 refills | Status: DC | PRN

## 2019-03-13 MED ORDER — TRAZODONE HCL 50 MG PO TABS: 25.0000 mg | ORAL_TABLET | Freq: Every evening | ORAL | 1 refills | Status: DC | PRN

## 2019-03-13 NOTE — Progress Notes (Signed)
Subjective:   Patient presents for Medicare Annual/Subsequent preventive examination.    She is working  On weight loss and exercise    Asthma- she has not been taking Dulera BID, will forget the evening dose,  Some times just skip , she does have albuterol   she thinks she has 2 rescue inhalers proair and ventolin ??, has been using both    Due for flu shot    Migraines- taking propranolol, topamax, and maxalt    GERD- using omeprazole , not sure if she is using the pepcid    [facility]- [facility]   She does not sleep well.  She is on Ambien but is not worked very well for her.  She flexes health also quite stressed on her husband is retired from [employer] he is back at home and they are not used to living together.  She would like to try something to help in general with stressors in her sleep. Review Past Medical/Family/Social:   Risk Factors  Current exercise habits: some exercise  Dietary issues discussed Yes  Cardiac risk factors: Obesity (BMI >= 30 kg/m2).   Depression Screen - PHQ SCORE 12  (Note: if answer to either of the following is "Yes", a more complete depression screening is indicated)  Over the past two weeks, have you felt down, depressed or hopeless? Yes Over the past two weeks, have you felt little interest or pleasure in doing things? Yes Have you lost interest or pleasure in daily life? Yes Do you often feel hopeless? No Do you cry easily over simple problems? No   Activities of Daily Living  In your present state of health, do you have any difficulty performing the following activities?:  Driving? No  Managing money? yes  Feeding yourself? No  Getting from bed to chair? yes  Climbing a flight of stairs? Yes  -- Knee pain following with orthopedics  Preparing food and eating?: No  Bathing or showering? No  Getting dressed: No  Getting to the toilet? No  Using the toilet:No  Moving around from place to place: yes In the past year have you  fallen or had a near fall?:No  Are you sexually active? No  Do you have more than one partner? No   Hearing Difficulties: Yes - evaluated by ENT in past  Do you often ask people to speak up or repeat themselves? yes Do you experience ringing or noises in your ears? sometimes Do you have difficulty understanding soft or whispered voices? Yes Do you feel that you have a problem with memory? No Do you often misplace items? Yes  Do you feel safe at home? Yes  Cognitive Testing  Alert? Yes Normal Appearance?Yes  Oriented to person? Yes Place? Yes  Time? Yes  Recall of three objects? Yes  Can perform simple calculations? Yes  Displays appropriate judgment?Yes  Can read the correct time from a watch face?Yes   List the Names of Other Physician/Practitioners you currently use:   Dr. Percell Miller- Orthopedics  Dr. Neldon Mc- Allergy   Dr. Gertie Fey [facility] Dentist / Eye doctor   Screening Tests / Date Colonoscopy    UTD                 Zostavax - UTD  Mammogram -UTD  Influenza Vaccine UTD pNEUMONIA- Has had prevnar 41, declines further  Tetanus/tdap- Declines  Bone density- will check on  Hepatitis C- negative    ROS: GEN-+fatigue, fever, weight loss,weakness, recent illness  HEENT- denies eye drainage, change in vision, nasal discharge, CVS- denies chest pain, palpitations RESP- denies SOB, cough, wheeze ABD- denies N/V, change in stools, abd pain GU- denies dysuria, hematuria, dribbling, incontinence MSK- + joint pain, muscle aches, injury Neuro- denies headache, dizziness, syncope, seizure activity  Physical: GEN- NAD, alert and oriented x3 HEENT- PERRL, EOMI, non injected sclera, pink conjunctiva, MMM, oropharynx clear Neck- Supple, no thryomegaly, no bruit CVS- RRR, no murmur RESP-CTAB ABD-NABS,soft,NT,ND EXT- No edema Pulses- Radial, DP- 2+   Audit C- neg Fall precautions discussed  Assessment:    Annual wellness medicare exam   Plan:    During  the course of the visit the patient was educated and counseled about appropriate screening and preventive services including:   I will obtain records from physicians for plan for her mammogram and last bone density.  Depression with insomnia we will try her on trazodone will start with 25 mg and increase to 50 mg at bedtime will discontinue the Ambien for now.  She will return for fasting labs in the morning.  Asthma discussed the importance of her maintenance inhaler.  She will bring in both inhalers I think that she has 2 different brands of albuterol at home advised her not to use both rescue inhalers.  She declines pneumonia vaccine she was given flu shot   Acid reflux unclear which medication she is talking about at home I think she has omeprazole and Pepcid but she states that she has not been taking both of these.  She is going to bring her meds in in the morning when she comes to get her labs done.  Hypothyroidism she is due for repeat thyroid function studies   Migraines no change to meds      Diet review for nutrition referral? Yes ____ Not Indicated __x__  Patient Instructions (the written plan) was given to the patient.  Medicare Attestation  I have personally reviewed:  The patient's medical and social history  Their use of alcohol, tobacco or illicit drugs  Their current medications and supplements  The patient's functional ability including ADLs,fall risks, home safety risks, cognitive, and hearing and visual impairment  Diet and physical activities  Evidence for depression or mood disorders  The patient's weight, height, BMI, and visual acuity have been recorded in the chart. I have made referrals, counseling, and provided education to the patient based on review of the above and I have provided the patient with a written personalized care plan for preventive services.

## 2019-03-13 NOTE — Patient Instructions (Addendum)
Trazodone at bedtime  Stop for the ambien Return in the morning for fasting labs  F/U 4 months ( 30 MINUTE)

## 2019-03-14 ENCOUNTER — Other Ambulatory Visit: Payer: Medicare Other

## 2019-03-14 ENCOUNTER — Other Ambulatory Visit: Payer: Self-pay

## 2019-03-14 DIAGNOSIS — E039 Hypothyroidism, unspecified: Secondary | ICD-10-CM | POA: Diagnosis not present

## 2019-03-14 DIAGNOSIS — Z0000 Encounter for general adult medical examination without abnormal findings: Secondary | ICD-10-CM | POA: Diagnosis not present

## 2019-03-14 MED ORDER — FAMOTIDINE 40 MG PO TABS: 40.0000 mg | ORAL_TABLET | Freq: Every day | ORAL | Status: DC

## 2019-03-14 NOTE — Addendum Note (Signed)
Addended by: Vic Blackbird F on: 03/14/2019 01:49 PM   Modules accepted: Orders

## 2019-03-15 LAB — CBC WITH DIFFERENTIAL/PLATELET
Absolute Monocytes: 357 cells/uL (ref 200–950)
Basophils Absolute: 31 cells/uL (ref 0–200)
Basophils Relative: 0.6 %
Eosinophils Absolute: 199 cells/uL (ref 15–500)
Eosinophils Relative: 3.9 %
HCT: 43.2 % (ref 35.0–45.0)
Hemoglobin: 14.5 g/dL (ref 11.7–15.5)
Lymphs Abs: 2672 cells/uL (ref 850–3900)
MCH: 31.4 pg (ref 27.0–33.0)
MCHC: 33.6 g/dL (ref 32.0–36.0)
MCV: 93.5 fL (ref 80.0–100.0)
MPV: 11.6 fL (ref 7.5–12.5)
Monocytes Relative: 7 %
Neutro Abs: 1841 cells/uL (ref 1500–7800)
Neutrophils Relative %: 36.1 %
Platelets: 242 10*3/uL (ref 140–400)
RBC: 4.62 10*6/uL (ref 3.80–5.10)
RDW: 14 % (ref 11.0–15.0)
Total Lymphocyte: 52.4 %
WBC: 5.1 10*3/uL (ref 3.8–10.8)

## 2019-03-15 LAB — COMPREHENSIVE METABOLIC PANEL
AG Ratio: 1.5 (calc) (ref 1.0–2.5)
ALT: 19 U/L (ref 6–29)
AST: 15 U/L (ref 10–35)
Albumin: 4.2 g/dL (ref 3.6–5.1)
Alkaline phosphatase (APISO): 57 U/L (ref 37–153)
BUN: 11 mg/dL (ref 7–25)
CO2: 27 mmol/L (ref 20–32)
Calcium: 9.4 mg/dL (ref 8.6–10.4)
Chloride: 107 mmol/L (ref 98–110)
Creat: 0.9 mg/dL (ref 0.50–0.99)
Globulin: 2.8 g/dL (calc) (ref 1.9–3.7)
Glucose, Bld: 88 mg/dL (ref 65–99)
Potassium: 4 mmol/L (ref 3.5–5.3)
Sodium: 140 mmol/L (ref 135–146)
Total Bilirubin: 0.3 mg/dL (ref 0.2–1.2)
Total Protein: 7 g/dL (ref 6.1–8.1)

## 2019-03-15 LAB — T3, FREE: T3, Free: 2.9 pg/mL (ref 2.3–4.2)

## 2019-03-15 LAB — LIPID PANEL
Cholesterol: 177 mg/dL (ref <200)
HDL: 49 mg/dL — ABNORMAL LOW (ref >=50)
LDL Cholesterol (Calc): 109 mg/dL (calc) — ABNORMAL HIGH
Non-HDL Cholesterol (Calc): 128 mg/dL (calc) (ref <130)
Total CHOL/HDL Ratio: 3.6 (calc) (ref <5.0)
Triglycerides: 96 mg/dL (ref <150)

## 2019-03-15 LAB — T4, FREE: Free T4: 1.2 ng/dL (ref 0.8–1.8)

## 2019-03-15 LAB — TSH: TSH: 4.61 mIU/L — ABNORMAL HIGH (ref 0.40–4.50)

## 2019-03-16 ENCOUNTER — Other Ambulatory Visit: Payer: Self-pay | Admitting: *Deleted

## 2019-03-16 DIAGNOSIS — E039 Hypothyroidism, unspecified: Secondary | ICD-10-CM

## 2019-03-16 MED ORDER — PANTOPRAZOLE SODIUM 40 MG PO TBEC: 40.0000 mg | DELAYED_RELEASE_TABLET | Freq: Every day | ORAL | 3 refills | Status: DC

## 2019-04-12 ENCOUNTER — Other Ambulatory Visit: Payer: Self-pay

## 2019-04-13 ENCOUNTER — Ambulatory Visit (INDEPENDENT_AMBULATORY_CARE_PROVIDER_SITE_OTHER): Payer: Medicare Other | Admitting: Family Medicine

## 2019-04-13 ENCOUNTER — Encounter: Payer: Self-pay | Admitting: Family Medicine

## 2019-04-13 VITALS — BP 126/82 | HR 76 | Temp 98.6°F | Resp 16 | Ht 64.0 in | Wt 207.0 lb

## 2019-04-13 DIAGNOSIS — M545 Low back pain, unspecified: Secondary | ICD-10-CM

## 2019-04-13 DIAGNOSIS — M1710 Unilateral primary osteoarthritis, unspecified knee: Secondary | ICD-10-CM | POA: Insufficient documentation

## 2019-04-13 DIAGNOSIS — M179 Osteoarthritis of knee, unspecified: Secondary | ICD-10-CM | POA: Insufficient documentation

## 2019-04-13 DIAGNOSIS — M1711 Unilateral primary osteoarthritis, right knee: Secondary | ICD-10-CM | POA: Diagnosis not present

## 2019-04-13 MED ORDER — TRAMADOL-ACETAMINOPHEN 37.5-325 MG PO TABS: 1.0000 | ORAL_TABLET | Freq: Four times a day (QID) | ORAL | 0 refills | Status: DC | PRN

## 2019-04-13 MED ORDER — TIZANIDINE HCL 4 MG PO TABS: 4.0000 mg | ORAL_TABLET | Freq: Every evening | ORAL | 0 refills | Status: DC | PRN

## 2019-04-13 MED ORDER — RIZATRIPTAN BENZOATE 10 MG PO TABS: 10.0000 mg | ORAL_TABLET | ORAL | 2 refills | Status: DC | PRN

## 2019-04-13 NOTE — Patient Instructions (Addendum)
Use epsom salt  Use heating pad Zanaflex at bedtime for muscle realaxer Ultracet for pain, do not take extra tylenol with this F/U as previous

## 2019-04-13 NOTE — Progress Notes (Signed)
   Subjective:    Patient ID: Brooke Schneider, female    DOB: 1950/08/04, 68 y.o.   MRN: [id number]  Patient presents for Back Pain (x weeks- states that she has mid to low back pain worse on R side- some radiation to R hip/ leg)   Pt here with right sided back pain mid to low back and across her back for the past few weeks. She had been working in her home when her refrigerator went out she had to clean up and move things to the garage. That is the only major thing she did recently. She does do " a lot" at home for cleaning, cooking, carrying groceries, feels she overdose it at times  She has been following with Dr. Creig Hines for her right knee and leg pain. They are considering knee replacement   No new tingling or numbness in her feet  With back pain No change in bowel bladder  She has been taking tylenol for pain last week       Review Of Systems:  GEN- denies fatigue, fever, weight loss,weakness, recent illness HEENT- denies eye drainage, change in vision, nasal discharge, CVS- denies chest pain, palpitations RESP- denies SOB, cough, wheeze ABD- denies N/V, change in stools, abd pain GU- denies dysuria, hematuria, dribbling, incontinence MSK- + joint pain, muscle aches, injury Neuro- denies headache, dizziness, syncope, seizure activity       Objective:    BP 126/82   Pulse 76   Temp 98.6 F (37 C) (Temporal)   Resp 16   Ht 5\' 4"  (1.626 m)   Wt 207 lb (93.9 kg)   SpO2 98%   BMI 35.53 kg/m  GEN- NAD, alert and oriented x3 CVS- RRR, no murmur RESP-CTAB ABD-NABS,soft,NT,ND MSK- TTP right low thoracic and lumbar paraspinals, +spasm, fair ROM, spine NT, neg SLR, fair ROM HIPS, Decreased ROM Right knee, no effusion joints Neuro- normal tone LE, sensation grossly intact,strength in tact LE EXT- No edema Pulses- Radial 2+        Assessment & Plan:      Problem List Items Addressed This Visit      Unprioritized   OA (osteoarthritis) of knee    Ultracet  refilled Follow-up orthopedics.      Relevant Medications   traMADol-acetaminophen (ULTRACET) 37.5-325 MG tablet   tiZANidine (ZANAFLEX) 4 MG tablet    Other Visit Diagnoses    Acute right-sided low back pain without sciatica    -  Primary   Acute muscular pain, also favors right knee due to OA. Trial of zanaflex for spasm, heating pad, Ultracet refilled for Knee and back, no NSAIDS due to GERD. No red flags, no imaging needed at this time    Relevant Medications   traMADol-acetaminophen (ULTRACET) 37.5-325 MG tablet   tiZANidine (ZANAFLEX) 4 MG tablet      Note: This dictation was prepared with Dragon dictation along with smaller phrase technology. Any transcriptional errors that result from this process are unintentional.

## 2019-04-13 NOTE — Assessment & Plan Note (Signed)
Ultracet refilled Follow-up orthopedics.

## 2019-04-25 DIAGNOSIS — M1711 Unilateral primary osteoarthritis, right knee: Secondary | ICD-10-CM | POA: Diagnosis not present

## 2019-04-25 DIAGNOSIS — M25561 Pain in right knee: Secondary | ICD-10-CM | POA: Diagnosis not present

## 2019-05-22 ENCOUNTER — Other Ambulatory Visit: Payer: Self-pay

## 2019-05-22 ENCOUNTER — Encounter: Payer: Self-pay | Admitting: Allergy and Immunology

## 2019-05-22 ENCOUNTER — Ambulatory Visit (INDEPENDENT_AMBULATORY_CARE_PROVIDER_SITE_OTHER): Payer: Medicare Other | Admitting: Allergy and Immunology

## 2019-05-22 VITALS — BP 110/70 | HR 71 | Temp 97.9°F | Resp 18 | Ht 65.0 in

## 2019-05-22 DIAGNOSIS — L299 Pruritus, unspecified: Secondary | ICD-10-CM

## 2019-05-22 DIAGNOSIS — J3089 Other allergic rhinitis: Secondary | ICD-10-CM

## 2019-05-22 DIAGNOSIS — J4540 Moderate persistent asthma, uncomplicated: Secondary | ICD-10-CM

## 2019-05-22 DIAGNOSIS — K219 Gastro-esophageal reflux disease without esophagitis: Secondary | ICD-10-CM

## 2019-05-22 MED ORDER — BUDESONIDE-FORMOTEROL FUMARATE 160-4.5 MCG/ACT IN AERO: 2.0000 | INHALATION_SPRAY | Freq: Two times a day (BID) | RESPIRATORY_TRACT | 5 refills | Status: DC

## 2019-05-22 MED ORDER — PAZEO 0.7 % OP SOLN: 1.0000 [drp] | Freq: Every day | OPHTHALMIC | 1 refills | Status: DC | PRN

## 2019-05-22 MED ORDER — DULERA 200-5 MCG/ACT IN AERO: 2.0000 | INHALATION_SPRAY | Freq: Two times a day (BID) | RESPIRATORY_TRACT | 5 refills | Status: DC

## 2019-05-22 MED ORDER — MONTELUKAST SODIUM 10 MG PO TABS: 10.0000 mg | ORAL_TABLET | Freq: Every day | ORAL | 1 refills | Status: DC

## 2019-05-22 MED ORDER — ALBUTEROL SULFATE HFA 108 (90 BASE) MCG/ACT IN AERS: 2.0000 | INHALATION_SPRAY | Freq: Four times a day (QID) | RESPIRATORY_TRACT | 1 refills | Status: DC | PRN

## 2019-05-22 MED ORDER — FAMOTIDINE 40 MG PO TABS: 40.0000 mg | ORAL_TABLET | Freq: Every day | ORAL | Status: DC

## 2019-05-22 MED ORDER — OMEPRAZOLE 40 MG PO CPDR: 40.0000 mg | DELAYED_RELEASE_CAPSULE | Freq: Every day | ORAL | 1 refills | Status: DC

## 2019-05-22 NOTE — Patient Instructions (Addendum)
  1. Continue to treat and prevent inflammation:   A. Dulera 100 /Symbicort 160 - 2 inhalations 2 times per day with spacer. Samples  B. OTC Nasacort / Flonase - one spray each nostril 1-7 times a week  C. Singulair 10 mg - one tablet one time per day  2.  Continue to treat and prevent reflux:   A. omeprazole 40 mg in a.m.  B.  Famotidine 40 mg in PM  3. If needed:   A. Antihistamine - Atarax 10mg  - one tablet every 8 hours if needed  B. ProAir HFA or similar - 2 inhalations every 4-6 hours  C. Pazeo - one drop each eye one time per day  4.  Return to clinic 6 months or earlier if problem  5.  Obtain covid vaccine  6.  Use spinal anesthetic instead of general anesthetic for upcoming total knee replacement

## 2019-05-22 NOTE — Progress Notes (Signed)
[place] [facility] [place] - [place] - [place] - [place]   Follow-up Note  Referring Provider: Jeanice Lim, Velna Hatchet, MD  Primary Provider: Salley Scarlet, MD Date of Office Visit: 05/22/2019  Subjective:   Brooke Schneider (DOB: 02-Jul-1950) is a 68 y.o. female who [facility] in re-evaluation of the following:  HPI: Brooke Schneider returns to this clinic in reevaluation of asthma and allergic rhinoconjunctivitis and LPR pruritic disorder with eosinophilia.  I last saw her in this clinic on 14 November 2018.  For some reason, which is not entirely clear, Brooke Schneider has run out of her [facility] for several months and she has noticed more shortness of breath and chest tightness and a slight cough.  She is very confused about her medications.  It does not sound as though she has required a systemic steroid or an antibiotic to treat an airway issue.  She believes that her nose is doing very well on some nasal steroid and a leukotriene modifier.  She believes that her reflux is under very good control with the use of omeprazole and famotidine.  She has not been having any issues with pruritus.  She did obtain a flu vaccine.  She will be having right total knee replacement sometime in the future.  Allergies as of 05/22/2019      Reactions   Sulfamethoxazole-trimethoprim Swelling   Metronidazole Itching, Rash      Medication List      AeroChamber Plus inhaler Use as instructed   albuterol 108 (90 Base) MCG/ACT inhaler Commonly known as: ProAir HFA Inhale 2 puffs into the lungs every 4 (four) hours as needed for wheezing or shortness of breath.   buPROPion 300 MG 24 hr tablet Commonly known as: WELLBUTRIN XL TAKE 1 TABLET DAILY (DOSE CHANGE)   Calcium 600+D 600-400 MG-UNIT tablet Generic drug: Calcium Carbonate-Vitamin D Take 1 tablet by mouth 2 (two) times daily.   dicyclomine 20 MG tablet Commonly known as: BENTYL TAKE 1 TABLET FOUR TIMES A DAY  BEFORE MEALS AND AT BEDTIME   Divigel 0.25 MG/0.25GM Gel Generic drug: Estradiol Apply 1 application topically daily. Thigh area   Estradiol 10 MCG Tabs vaginal tablet estradiol 10 mcg vaginal tablet   famotidine 40 MG tablet Commonly known as: PEPCID Take 1 tablet (40 mg total) by mouth at bedtime.   hydrOXYzine 10 MG tablet Commonly known as: ATARAX/VISTARIL Take one tablet twice daily if needed   levothyroxine 125 MCG tablet Commonly known as: SYNTHROID Take 1 tablet (125 mcg total) by mouth daily.   LOTRIMIN AF EX Apply topically.   mometasone-formoterol 200-5 MCG/ACT Aero Commonly known as: Dulera Inhale 2 puffs into the lungs 2 (two) times daily.   montelukast 10 MG tablet Commonly known as: SINGULAIR TAKE 1 TABLET AT BEDTIME   multivitamin with minerals tablet Take 1 tablet by mouth daily.   nystatin-triamcinolone cream Commonly known as: MYCOLOG II   pantoprazole 40 MG tablet Commonly known as: PROTONIX Take 1 tablet (40 mg total) by mouth daily.   Pazeo 0.7 % Soln Generic drug: Olopatadine HCl Place 1 drop into both eyes daily as needed.   propranolol ER 60 MG 24 hr capsule Commonly known as: INDERAL LA Take 1 capsule (60 mg total) by mouth daily.   rizatriptan 10 MG tablet Commonly known as: MAXALT Take 1 tablet (10 mg total) by mouth as needed for migraine. Reported on 08/12/2015   tiZANidine 4 MG tablet Commonly known as: Zanaflex  Take 1 tablet (4 mg total) by mouth at bedtime as needed for muscle spasms.   topiramate 100 MG tablet Commonly known as: TOPAMAX TAKE 50 MG IN THE MORNING AND 100 MG AT BEDTIME   traMADol-acetaminophen 37.5-325 MG tablet Commonly known as: Ultracet Take 1 tablet by mouth every 6 (six) hours as needed.   traZODone 50 MG tablet Commonly known as: DESYREL Take 0.5-1 tablets (25-50 mg total) by mouth at bedtime as needed for sleep.   zolpidem 10 MG tablet Commonly known as: AMBIEN Take 1 tablet (10 mg total) by  mouth at bedtime as needed. for sleep       Past Medical History:  Diagnosis Date  . Allergic rhinitis   . Asthma   . Depression   . Goiter   . Goiter   . FWYOVZCH(885.0)     Past Surgical History:  Procedure Laterality Date  . ABDOMINAL HYSTERECTOMY    . CHOLECYSTECTOMY N/A 11/02/2013   Procedure: LAPAROSCOPIC CHOLECYSTECTOMY ;  Surgeon: Gwenyth Ober, MD;  Location: [place];  Service: General;  Laterality: N/A;  . KNEE SURGERY    . THYROIDECTOMY      Review of systems negative except as noted in HPI / PMHx or noted below:  Review of Systems  Constitutional: Negative.   HENT: Negative.   Eyes: Negative.   Respiratory: Negative.   Cardiovascular: Negative.   Gastrointestinal: Negative.   Genitourinary: Negative.   Musculoskeletal: Negative.   Skin: Negative.   Neurological: Negative.   Endo/Heme/Allergies: Negative.   Psychiatric/Behavioral: Negative.      Objective:   Vitals:   05/22/19 1528  BP: 110/70  Pulse: 71  Resp: 18  Temp: 97.9 F (36.6 C)  SpO2: 98%   Height: 5\' 5"  (165.1 cm)      Physical Exam Constitutional:      Appearance: She is not diaphoretic.  HENT:     Head: Normocephalic.     Right Ear: Tympanic membrane, ear canal and external ear normal.     Left Ear: Tympanic membrane, ear canal and external ear normal.     Nose: Nose normal. No mucosal edema or rhinorrhea.     Mouth/Throat:     Pharynx: Uvula midline. No oropharyngeal exudate.  Eyes:     Conjunctiva/sclera: Conjunctivae normal.  Neck:     Thyroid: No thyromegaly.     Trachea: Trachea normal. No tracheal tenderness or tracheal deviation.  Cardiovascular:     Rate and Rhythm: Normal rate and regular rhythm.     Heart sounds: Normal heart sounds, S1 normal and S2 normal. No murmur.  Pulmonary:     Effort: No respiratory distress.     Breath sounds: Normal breath sounds. No stridor. No wheezing or rales.  Lymphadenopathy:     Head:     Right side of head: No tonsillar  adenopathy.     Left side of head: No tonsillar adenopathy.     Cervical: No cervical adenopathy.  Skin:    Findings: No erythema or rash.     Nails: There is no clubbing.  Neurological:     Mental Status: She is alert.     Diagnostics:    Spirometry was performed and demonstrated an FEV1 of 1.83 at 92 % of predicted.    Results of blood tests obtained 14 March 2019 identifies WBC 5.1, absolute eosinophil 199, absolute lymphocyte 2672, hemoglobin 14.5, platelet 242.  Assessment and Plan:   1. Not well controlled moderate persistent asthma   2.  Other allergic rhinitis   3. LPRD (laryngopharyngeal reflux disease)   4. Pruritic disorder     1. Continue to treat and prevent inflammation:   A. Dulera 100 /Symbicort 160 - 2 inhalations 2 times per day with spacer. Samples  B. OTC Nasacort / Flonase - one spray each nostril 1-7 times a week  C. Singulair 10 mg - one tablet one time per day  2.  Continue to treat and prevent reflux:   A.  omeprazole 40 mg in a.m.  B.  Famotidine 40 mg in PM  3. If needed:   A. Antihistamine - Atarax 10mg  - one tablet every 8 hours if needed  B. ProAir HFA or similar - 2 inhalations every 4-6 hours  C. Pazeo - one drop each eye one time per day  4.  Return to clinic 6 months or earlier if problem  5.  Obtain covid vaccine  6.  Use spinal anesthetic instead of general anesthetic for upcoming total knee replacement   Brooke Schneider needs to use anti-inflammatory agents for her airway on a consistent basis and we gave her samples of Symbicort 160 today and once we have return of the samples of Dulera we will provide that medication for her as well.  She will continue to use other anti-inflammatory agents for her airway and therapy directed against reflux.  She has an upcoming total knee replacement and I did recommend that she get a spinal anesthetic instead of a general anesthetic as not to create significant inflammation of her lower airway during that  procedure.  Assuming she does well I will see her back in his clinic in 6 months or earlier if there is a problem.  Brooke SchimkeEric Amiyah Shryock, MD Allergy / Immunology [facility]

## 2019-05-23 ENCOUNTER — Encounter: Payer: Self-pay | Admitting: Allergy and Immunology

## 2019-05-28 ENCOUNTER — Telehealth: Payer: Self-pay

## 2019-05-28 MED ORDER — DULERA 200-5 MCG/ACT IN AERO: 2.0000 | INHALATION_SPRAY | Freq: Two times a day (BID) | RESPIRATORY_TRACT | 5 refills | Status: DC

## 2019-05-28 NOTE — Telephone Encounter (Signed)
Please inform patient that we will provide her her prescription for [facility].  We may need to get a prior authorization.

## 2019-05-28 NOTE — Addendum Note (Signed)
Addended by: Herbie Drape on: 05/28/2019 11:53 AM   Modules accepted: Orders

## 2019-05-28 NOTE — Telephone Encounter (Signed)
Patient called and stated that she was seen by Dr. Neldon Mc on 05/22/2019 and he switched her from [facility] 200 to Symbicort 160. Patient feels that the Symbicort is not working for her and she feels the [place] works better. She informed me that she started taking her Dulera again and is already feeling much better than when she was taking the Symbicort. Patient is wanting to know if we can [facility]. Please advise.

## 2019-05-28 NOTE — Telephone Encounter (Signed)
Called and informed patient that Rx has been sent in. 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

## 2019-06-28 ENCOUNTER — Other Ambulatory Visit: Payer: Self-pay | Admitting: Allergy and Immunology

## 2019-07-02 DIAGNOSIS — M1711 Unilateral primary osteoarthritis, right knee: Secondary | ICD-10-CM | POA: Diagnosis not present

## 2019-07-02 DIAGNOSIS — M25561 Pain in right knee: Secondary | ICD-10-CM | POA: Diagnosis not present

## 2019-07-17 ENCOUNTER — Ambulatory Visit (INDEPENDENT_AMBULATORY_CARE_PROVIDER_SITE_OTHER): Payer: Medicare Other | Admitting: Family Medicine

## 2019-07-17 ENCOUNTER — Encounter: Payer: Self-pay | Admitting: Family Medicine

## 2019-07-17 ENCOUNTER — Other Ambulatory Visit: Payer: Self-pay

## 2019-07-17 VITALS — BP 134/68 | HR 72 | Temp 98.7°F | Resp 14 | Ht 65.0 in | Wt 210.0 lb

## 2019-07-17 DIAGNOSIS — J4540 Moderate persistent asthma, uncomplicated: Secondary | ICD-10-CM

## 2019-07-17 DIAGNOSIS — F5101 Primary insomnia: Secondary | ICD-10-CM | POA: Diagnosis not present

## 2019-07-17 DIAGNOSIS — F331 Major depressive disorder, recurrent, moderate: Secondary | ICD-10-CM

## 2019-07-17 DIAGNOSIS — E039 Hypothyroidism, unspecified: Secondary | ICD-10-CM | POA: Diagnosis not present

## 2019-07-17 DIAGNOSIS — K219 Gastro-esophageal reflux disease without esophagitis: Secondary | ICD-10-CM | POA: Diagnosis not present

## 2019-07-17 DIAGNOSIS — G43909 Migraine, unspecified, not intractable, without status migrainosus: Secondary | ICD-10-CM

## 2019-07-17 DIAGNOSIS — E669 Obesity, unspecified: Secondary | ICD-10-CM

## 2019-07-17 LAB — T3, FREE: T3, Free: 2.9 pg/mL (ref 2.3–4.2)

## 2019-07-17 LAB — TSH: TSH: 2.1 mIU/L (ref 0.40–4.50)

## 2019-07-17 LAB — T4, FREE: Free T4: 1.4 ng/dL (ref 0.8–1.8)

## 2019-07-17 MED ORDER — ZOLPIDEM TARTRATE 10 MG PO TABS: 10.0000 mg | ORAL_TABLET | Freq: Every evening | ORAL | 1 refills | Status: DC | PRN

## 2019-07-17 MED ORDER — PAZEO 0.7 % OP SOLN: 1.0000 [drp] | Freq: Every day | OPHTHALMIC | 1 refills | Status: DC | PRN

## 2019-07-17 MED ORDER — RIZATRIPTAN BENZOATE 10 MG PO TABS: 10.0000 mg | ORAL_TABLET | ORAL | 2 refills | Status: DC | PRN

## 2019-07-17 NOTE — Assessment & Plan Note (Signed)
Continue wellbutrin

## 2019-07-17 NOTE — Progress Notes (Signed)
Subjective:    Patient ID: Brooke Schneider, female    DOB: June 18, 1950, 70 y.o.   MRN: [id number]  Patient presents for Follow-up (is fasting) and R Knee Pain (x years- throbbing pain- is seeing ortho)   Pt here to f/u chronic medical problems  Medications reviewed   Hypothyroidism- taking levothyroxine once a day , she is due for repeat TSH, her last level was 4.61 in Oct   Chronic knee pain- followed by ortho , scheduled for knee surgery on 2/19, out of ultracet    Asthma allergies- followed by Dr. Lucie Leather, on [facility], she is not on symbicort , she also has albuterol, she is also on eye drops she needs refill   Migraines- she is taking topamax 50mg  in the morning and 100mg  BID.She has not been taking the maxalt. She also has propranolol which helps   Chronic insomnia- Trazodone did not help sleep, wants to restart ambien , there was also no help with mood, she finds herself waking up lot . Still taking wellbutrin for depression but states she is always tired, doesn't have energy, still feels depressed, shei s not sleeping well per above, and this affects her mood   GERD- She has 2 different PPI listed for AM, taking pepcid   GYN- Dr. [facility]  Info given about COVID-19 vaccine   Review Of Systems:  GEN- denies fatigue, fever, weight loss,weakness, recent illness HEENT- denies eye drainage, change in vision, nasal discharge, CVS- denies chest pain, palpitations RESP- denies SOB, cough, wheeze ABD- denies N/V, change in stools, abd pain GU- denies dysuria, hematuria, dribbling, incontinence MSK- + joint pain, muscle aches, injury Neuro- denies headache, dizziness, syncope, seizure activity       Objective:    BP 134/68   Pulse 72   Temp 98.7 F (37.1 C) (Temporal)   Resp 14   Ht 5\' 5"  (1.651 m)   Wt 210 lb (95.3 kg)   SpO2 97%   BMI 34.95 kg/m  GEN- NAD, alert and oriented x3 HEENT- PERRL, EOMI, non injected sclera, pink conjunctiva,  Neck-  Supple, no thyromegaly CVS- RRR, no murmur RESP-CTAB ABD-NABS,soft,NT,ND Psych- normal affect and mood-, no SI, PHQ score 24  EXT- No edema Pulses- Radial, DP- 2+        Assessment & Plan:      Problem List Items Addressed This Visit      Unprioritized   Asthma, allergic - Primary    She is only using Dulera and rescue inhaler I refilled her pazeo eye drop Continue with allergist      GERD (gastroesophageal reflux disease)    Recheck meds at home, figure out which PPI she is actually taking Continue pepcid at bedtime which helps break-through symptoms       Hypothyroidism    Check TFT, continue thyroid replacement       Relevant Orders   TSH   T3, free   T4, free   Insomnia    D/c trazodone Restart ambien at pt request, this works better      MDD (major depressive disorder)    Continue wellbutrin      Migraine    Continue topamax, refilled maxalt, continue inderal      Relevant Medications   rizatriptan (MAXALT) 10 MG tablet   Obesity (BMI 30-39.9)      Note: This dictation was prepared with Dragon dictation along with smaller phrase technology. Any transcriptional errors that result from this process are unintentional.

## 2019-07-17 NOTE — Assessment & Plan Note (Signed)
Check TFT, continue thyroid replacement  

## 2019-07-17 NOTE — Assessment & Plan Note (Signed)
Recheck meds at home, figure out which PPI she is actually taking Continue pepcid at bedtime which helps break-through symptoms

## 2019-07-17 NOTE — Patient Instructions (Addendum)
Call about your pre-op information for knee We will call with lab results Check on which acid reflux pill you are taking Omeprazole or pantoprazole F/U 13months    COVID Vaccination Information As of right now, we will not be giving COVID-19 vaccines here in our office. It is too many storage and administrating regulations that our office is not equipped to provide at this time.    You can go online at [email or web address]   That website will give you information on all counties.    If not here are the numbers you can call. [facility] - [phone] [facility] Department [phone] or [phone] [facility] Department - [phone] opt.2 [facility] Department [phone]   You can also find information on [email or web address] or call the state's COVID-19 information phone number at 211.

## 2019-07-17 NOTE — Assessment & Plan Note (Signed)
D/c trazodone Restart ambien at pt request, this works better

## 2019-07-17 NOTE — Assessment & Plan Note (Signed)
She is only using Dulera and rescue inhaler I refilled her pazeo eye drop Continue with allergist

## 2019-07-17 NOTE — Assessment & Plan Note (Signed)
Continue topamax, refilled maxalt, continue inderal

## 2019-07-19 DIAGNOSIS — J309 Allergic rhinitis, unspecified: Secondary | ICD-10-CM | POA: Diagnosis not present

## 2019-07-19 DIAGNOSIS — N952 Postmenopausal atrophic vaginitis: Secondary | ICD-10-CM | POA: Diagnosis not present

## 2019-07-19 DIAGNOSIS — G43909 Migraine, unspecified, not intractable, without status migrainosus: Secondary | ICD-10-CM | POA: Diagnosis not present

## 2019-07-19 DIAGNOSIS — M1711 Unilateral primary osteoarthritis, right knee: Secondary | ICD-10-CM | POA: Diagnosis not present

## 2019-07-19 DIAGNOSIS — F329 Major depressive disorder, single episode, unspecified: Secondary | ICD-10-CM | POA: Diagnosis not present

## 2019-07-19 DIAGNOSIS — Z01812 Encounter for preprocedural laboratory examination: Secondary | ICD-10-CM | POA: Diagnosis not present

## 2019-07-19 DIAGNOSIS — J45909 Unspecified asthma, uncomplicated: Secondary | ICD-10-CM | POA: Diagnosis not present

## 2019-07-19 DIAGNOSIS — R413 Other amnesia: Secondary | ICD-10-CM | POA: Diagnosis not present

## 2019-07-19 DIAGNOSIS — E039 Hypothyroidism, unspecified: Secondary | ICD-10-CM | POA: Diagnosis not present

## 2019-07-19 DIAGNOSIS — Z01818 Encounter for other preprocedural examination: Secondary | ICD-10-CM | POA: Diagnosis not present

## 2019-07-19 DIAGNOSIS — Z01810 Encounter for preprocedural cardiovascular examination: Secondary | ICD-10-CM | POA: Diagnosis not present

## 2019-07-19 DIAGNOSIS — K219 Gastro-esophageal reflux disease without esophagitis: Secondary | ICD-10-CM | POA: Diagnosis not present

## 2019-07-24 DIAGNOSIS — Z01818 Encounter for other preprocedural examination: Secondary | ICD-10-CM | POA: Diagnosis not present

## 2019-07-27 DIAGNOSIS — M1711 Unilateral primary osteoarthritis, right knee: Secondary | ICD-10-CM | POA: Diagnosis present

## 2019-07-27 DIAGNOSIS — E669 Obesity, unspecified: Secondary | ICD-10-CM | POA: Diagnosis not present

## 2019-07-27 DIAGNOSIS — Z79899 Other long term (current) drug therapy: Secondary | ICD-10-CM | POA: Diagnosis not present

## 2019-07-27 DIAGNOSIS — G43909 Migraine, unspecified, not intractable, without status migrainosus: Secondary | ICD-10-CM | POA: Diagnosis present

## 2019-07-27 DIAGNOSIS — Z881 Allergy status to other antibiotic agents status: Secondary | ICD-10-CM | POA: Diagnosis not present

## 2019-07-27 DIAGNOSIS — F329 Major depressive disorder, single episode, unspecified: Secondary | ICD-10-CM | POA: Diagnosis present

## 2019-07-27 DIAGNOSIS — Z888 Allergy status to other drugs, medicaments and biological substances status: Secondary | ICD-10-CM | POA: Diagnosis not present

## 2019-07-27 DIAGNOSIS — Z7982 Long term (current) use of aspirin: Secondary | ICD-10-CM | POA: Diagnosis not present

## 2019-07-27 DIAGNOSIS — E890 Postprocedural hypothyroidism: Secondary | ICD-10-CM | POA: Diagnosis present

## 2019-07-27 DIAGNOSIS — Z471 Aftercare following joint replacement surgery: Secondary | ICD-10-CM | POA: Diagnosis not present

## 2019-07-27 DIAGNOSIS — R509 Fever, unspecified: Secondary | ICD-10-CM | POA: Diagnosis not present

## 2019-07-27 DIAGNOSIS — M25561 Pain in right knee: Secondary | ICD-10-CM | POA: Diagnosis not present

## 2019-07-27 DIAGNOSIS — J45909 Unspecified asthma, uncomplicated: Secondary | ICD-10-CM | POA: Diagnosis present

## 2019-07-27 DIAGNOSIS — Z6835 Body mass index (BMI) 35.0-35.9, adult: Secondary | ICD-10-CM | POA: Diagnosis not present

## 2019-07-27 DIAGNOSIS — E039 Hypothyroidism, unspecified: Secondary | ICD-10-CM | POA: Diagnosis not present

## 2019-07-27 DIAGNOSIS — Z96651 Presence of right artificial knee joint: Secondary | ICD-10-CM | POA: Diagnosis not present

## 2019-07-27 DIAGNOSIS — K219 Gastro-esophageal reflux disease without esophagitis: Secondary | ICD-10-CM | POA: Diagnosis present

## 2019-07-31 DIAGNOSIS — I25119 Atherosclerotic heart disease of native coronary artery with unspecified angina pectoris: Secondary | ICD-10-CM | POA: Diagnosis not present

## 2019-07-31 DIAGNOSIS — G43909 Migraine, unspecified, not intractable, without status migrainosus: Secondary | ICD-10-CM | POA: Diagnosis not present

## 2019-07-31 DIAGNOSIS — R413 Other amnesia: Secondary | ICD-10-CM | POA: Diagnosis not present

## 2019-07-31 DIAGNOSIS — F329 Major depressive disorder, single episode, unspecified: Secondary | ICD-10-CM | POA: Diagnosis not present

## 2019-07-31 DIAGNOSIS — J309 Allergic rhinitis, unspecified: Secondary | ICD-10-CM | POA: Diagnosis not present

## 2019-07-31 DIAGNOSIS — E890 Postprocedural hypothyroidism: Secondary | ICD-10-CM | POA: Diagnosis not present

## 2019-07-31 DIAGNOSIS — E1122 Type 2 diabetes mellitus with diabetic chronic kidney disease: Secondary | ICD-10-CM | POA: Diagnosis not present

## 2019-07-31 DIAGNOSIS — I252 Old myocardial infarction: Secondary | ICD-10-CM | POA: Diagnosis not present

## 2019-07-31 DIAGNOSIS — J45909 Unspecified asthma, uncomplicated: Secondary | ICD-10-CM | POA: Diagnosis not present

## 2019-07-31 DIAGNOSIS — N952 Postmenopausal atrophic vaginitis: Secondary | ICD-10-CM | POA: Diagnosis not present

## 2019-07-31 DIAGNOSIS — I509 Heart failure, unspecified: Secondary | ICD-10-CM | POA: Diagnosis not present

## 2019-07-31 DIAGNOSIS — Z471 Aftercare following joint replacement surgery: Secondary | ICD-10-CM | POA: Diagnosis not present

## 2019-07-31 DIAGNOSIS — I130 Hypertensive heart and chronic kidney disease with heart failure and stage 1 through stage 4 chronic kidney disease, or unspecified chronic kidney disease: Secondary | ICD-10-CM | POA: Diagnosis not present

## 2019-07-31 DIAGNOSIS — Z96651 Presence of right artificial knee joint: Secondary | ICD-10-CM | POA: Diagnosis not present

## 2019-07-31 DIAGNOSIS — N189 Chronic kidney disease, unspecified: Secondary | ICD-10-CM | POA: Diagnosis not present

## 2019-07-31 MED ORDER — ALUM & MAG HYDROXIDE-SIMETH 200-200-20 MG/5ML PO SUSP
20.00 | ORAL | Status: DC
Start: ? — End: 2019-07-31

## 2019-07-31 MED ORDER — GENERIC EXTERNAL MEDICATION
Status: DC
Start: ? — End: 2019-07-31

## 2019-07-31 MED ORDER — ASPIRIN 81 MG PO CHEW
81.00 | CHEWABLE_TABLET | ORAL | Status: DC
Start: 2019-07-30 — End: 2019-07-31

## 2019-07-31 MED ORDER — METOCLOPRAMIDE HCL 5 MG/ML IJ SOLN
10.00 | INTRAMUSCULAR | Status: DC
Start: ? — End: 2019-07-31

## 2019-07-31 MED ORDER — MONTELUKAST SODIUM 10 MG PO TABS
10.00 | ORAL_TABLET | ORAL | Status: DC
Start: 2019-07-30 — End: 2019-07-31

## 2019-07-31 MED ORDER — ALBUTEROL SULFATE (2.5 MG/3ML) 0.083% IN NEBU
2.50 | INHALATION_SOLUTION | RESPIRATORY_TRACT | Status: DC
Start: ? — End: 2019-07-31

## 2019-07-31 MED ORDER — FLUTICASONE PROPIONATE 50 MCG/ACT NA SUSP
2.00 | NASAL | Status: DC
Start: 2019-07-31 — End: 2019-07-31

## 2019-07-31 MED ORDER — HYDROCODONE-ACETAMINOPHEN 10-325 MG PO TABS
1.00 | ORAL_TABLET | ORAL | Status: DC
Start: ? — End: 2019-07-31

## 2019-07-31 MED ORDER — BUDESONIDE-FORMOTEROL FUMARATE 160-4.5 MCG/ACT IN AERO
2.00 | INHALATION_SPRAY | RESPIRATORY_TRACT | Status: DC
Start: 2019-07-30 — End: 2019-07-31

## 2019-07-31 MED ORDER — TOPIRAMATE 25 MG PO TABS
50.00 | ORAL_TABLET | ORAL | Status: DC
Start: 2019-07-30 — End: 2019-07-31

## 2019-07-31 MED ORDER — BENZOCAINE-MENTHOL 15-3.6 MG MT LOZG
1.00 | LOZENGE | OROMUCOSAL | Status: DC
Start: ? — End: 2019-07-31

## 2019-07-31 MED ORDER — ACETAMINOPHEN 325 MG PO TABS
650.00 | ORAL_TABLET | ORAL | Status: DC
Start: 2019-07-30 — End: 2019-07-31

## 2019-07-31 MED ORDER — HYDROXYZINE HCL 25 MG PO TABS
25.00 | ORAL_TABLET | ORAL | Status: DC
Start: ? — End: 2019-07-31

## 2019-07-31 MED ORDER — ROPIVACAINE HCL-NACL 0.2-0.9 % IJ SOLN
745.00 | INTRAMUSCULAR | Status: DC
Start: ? — End: 2019-07-31

## 2019-07-31 MED ORDER — HYDROCODONE-ACETAMINOPHEN 5-325 MG PO TABS
1.00 | ORAL_TABLET | ORAL | Status: DC
Start: ? — End: 2019-07-31

## 2019-07-31 MED ORDER — ZOLPIDEM TARTRATE 5 MG PO TABS
10.00 | ORAL_TABLET | ORAL | Status: DC
Start: ? — End: 2019-07-31

## 2019-07-31 MED ORDER — ONDANSETRON HCL 4 MG/2ML IJ SOLN
4.00 | INTRAMUSCULAR | Status: DC
Start: ? — End: 2019-07-31

## 2019-07-31 MED ORDER — ONDANSETRON HCL 4 MG PO TABS
4.00 | ORAL_TABLET | ORAL | Status: DC
Start: ? — End: 2019-07-31

## 2019-07-31 MED ORDER — METOCLOPRAMIDE HCL 10 MG PO TABS
10.00 | ORAL_TABLET | ORAL | Status: DC
Start: ? — End: 2019-07-31

## 2019-07-31 MED ORDER — SODIUM CHLORIDE 0.9 % IV SOLN
75.00 | INTRAVENOUS | Status: DC
Start: ? — End: 2019-07-31

## 2019-07-31 MED ORDER — GENERIC EXTERNAL MEDICATION
125.00 | Status: DC
Start: 2019-07-30 — End: 2019-07-31

## 2019-07-31 MED ORDER — ACETAMINOPHEN 325 MG PO TABS
650.00 | ORAL_TABLET | ORAL | Status: DC
Start: ? — End: 2019-07-31

## 2019-07-31 MED ORDER — BUPROPION HCL ER (XL) 150 MG PO TB24
300.00 | ORAL_TABLET | ORAL | Status: DC
Start: 2019-07-31 — End: 2019-07-31

## 2019-07-31 MED ORDER — PROPRANOLOL HCL 40 MG PO TABS
20.00 | ORAL_TABLET | ORAL | Status: DC
Start: 2019-07-30 — End: 2019-07-31

## 2019-07-31 MED ORDER — KETOROLAC TROMETHAMINE 15 MG/ML IJ SOLN
15.00 | INTRAMUSCULAR | Status: DC
Start: ? — End: 2019-07-31

## 2019-08-01 DIAGNOSIS — I130 Hypertensive heart and chronic kidney disease with heart failure and stage 1 through stage 4 chronic kidney disease, or unspecified chronic kidney disease: Secondary | ICD-10-CM | POA: Diagnosis not present

## 2019-08-01 DIAGNOSIS — Z96651 Presence of right artificial knee joint: Secondary | ICD-10-CM | POA: Diagnosis not present

## 2019-08-01 DIAGNOSIS — I25119 Atherosclerotic heart disease of native coronary artery with unspecified angina pectoris: Secondary | ICD-10-CM | POA: Diagnosis not present

## 2019-08-01 DIAGNOSIS — Z471 Aftercare following joint replacement surgery: Secondary | ICD-10-CM | POA: Diagnosis not present

## 2019-08-01 DIAGNOSIS — G43909 Migraine, unspecified, not intractable, without status migrainosus: Secondary | ICD-10-CM | POA: Diagnosis not present

## 2019-08-01 DIAGNOSIS — J45909 Unspecified asthma, uncomplicated: Secondary | ICD-10-CM | POA: Diagnosis not present

## 2019-08-03 DIAGNOSIS — Z96651 Presence of right artificial knee joint: Secondary | ICD-10-CM | POA: Diagnosis not present

## 2019-08-03 DIAGNOSIS — I130 Hypertensive heart and chronic kidney disease with heart failure and stage 1 through stage 4 chronic kidney disease, or unspecified chronic kidney disease: Secondary | ICD-10-CM | POA: Diagnosis not present

## 2019-08-03 DIAGNOSIS — J45909 Unspecified asthma, uncomplicated: Secondary | ICD-10-CM | POA: Diagnosis not present

## 2019-08-03 DIAGNOSIS — Z471 Aftercare following joint replacement surgery: Secondary | ICD-10-CM | POA: Diagnosis not present

## 2019-08-03 DIAGNOSIS — G43909 Migraine, unspecified, not intractable, without status migrainosus: Secondary | ICD-10-CM | POA: Diagnosis not present

## 2019-08-03 DIAGNOSIS — I25119 Atherosclerotic heart disease of native coronary artery with unspecified angina pectoris: Secondary | ICD-10-CM | POA: Diagnosis not present

## 2019-08-06 DIAGNOSIS — I25119 Atherosclerotic heart disease of native coronary artery with unspecified angina pectoris: Secondary | ICD-10-CM | POA: Diagnosis not present

## 2019-08-06 DIAGNOSIS — J45909 Unspecified asthma, uncomplicated: Secondary | ICD-10-CM | POA: Diagnosis not present

## 2019-08-06 DIAGNOSIS — Z471 Aftercare following joint replacement surgery: Secondary | ICD-10-CM | POA: Diagnosis not present

## 2019-08-06 DIAGNOSIS — Z96651 Presence of right artificial knee joint: Secondary | ICD-10-CM | POA: Diagnosis not present

## 2019-08-06 DIAGNOSIS — G43909 Migraine, unspecified, not intractable, without status migrainosus: Secondary | ICD-10-CM | POA: Diagnosis not present

## 2019-08-06 DIAGNOSIS — I130 Hypertensive heart and chronic kidney disease with heart failure and stage 1 through stage 4 chronic kidney disease, or unspecified chronic kidney disease: Secondary | ICD-10-CM | POA: Diagnosis not present

## 2019-08-08 DIAGNOSIS — I130 Hypertensive heart and chronic kidney disease with heart failure and stage 1 through stage 4 chronic kidney disease, or unspecified chronic kidney disease: Secondary | ICD-10-CM | POA: Diagnosis not present

## 2019-08-08 DIAGNOSIS — I25119 Atherosclerotic heart disease of native coronary artery with unspecified angina pectoris: Secondary | ICD-10-CM | POA: Diagnosis not present

## 2019-08-08 DIAGNOSIS — J45909 Unspecified asthma, uncomplicated: Secondary | ICD-10-CM | POA: Diagnosis not present

## 2019-08-08 DIAGNOSIS — G43909 Migraine, unspecified, not intractable, without status migrainosus: Secondary | ICD-10-CM | POA: Diagnosis not present

## 2019-08-08 DIAGNOSIS — Z96651 Presence of right artificial knee joint: Secondary | ICD-10-CM | POA: Diagnosis not present

## 2019-08-08 DIAGNOSIS — Z471 Aftercare following joint replacement surgery: Secondary | ICD-10-CM | POA: Diagnosis not present

## 2019-08-11 DIAGNOSIS — J45909 Unspecified asthma, uncomplicated: Secondary | ICD-10-CM | POA: Diagnosis not present

## 2019-08-11 DIAGNOSIS — G43909 Migraine, unspecified, not intractable, without status migrainosus: Secondary | ICD-10-CM | POA: Diagnosis not present

## 2019-08-11 DIAGNOSIS — I25119 Atherosclerotic heart disease of native coronary artery with unspecified angina pectoris: Secondary | ICD-10-CM | POA: Diagnosis not present

## 2019-08-11 DIAGNOSIS — I130 Hypertensive heart and chronic kidney disease with heart failure and stage 1 through stage 4 chronic kidney disease, or unspecified chronic kidney disease: Secondary | ICD-10-CM | POA: Diagnosis not present

## 2019-08-11 DIAGNOSIS — Z471 Aftercare following joint replacement surgery: Secondary | ICD-10-CM | POA: Diagnosis not present

## 2019-08-11 DIAGNOSIS — Z96651 Presence of right artificial knee joint: Secondary | ICD-10-CM | POA: Diagnosis not present

## 2019-08-14 DIAGNOSIS — Z96651 Presence of right artificial knee joint: Secondary | ICD-10-CM | POA: Diagnosis not present

## 2019-08-14 DIAGNOSIS — I130 Hypertensive heart and chronic kidney disease with heart failure and stage 1 through stage 4 chronic kidney disease, or unspecified chronic kidney disease: Secondary | ICD-10-CM | POA: Diagnosis not present

## 2019-08-14 DIAGNOSIS — G43909 Migraine, unspecified, not intractable, without status migrainosus: Secondary | ICD-10-CM | POA: Diagnosis not present

## 2019-08-14 DIAGNOSIS — Z471 Aftercare following joint replacement surgery: Secondary | ICD-10-CM | POA: Diagnosis not present

## 2019-08-14 DIAGNOSIS — I25119 Atherosclerotic heart disease of native coronary artery with unspecified angina pectoris: Secondary | ICD-10-CM | POA: Diagnosis not present

## 2019-08-14 DIAGNOSIS — J45909 Unspecified asthma, uncomplicated: Secondary | ICD-10-CM | POA: Diagnosis not present

## 2019-08-16 DIAGNOSIS — J45909 Unspecified asthma, uncomplicated: Secondary | ICD-10-CM | POA: Diagnosis not present

## 2019-08-16 DIAGNOSIS — G43909 Migraine, unspecified, not intractable, without status migrainosus: Secondary | ICD-10-CM | POA: Diagnosis not present

## 2019-08-16 DIAGNOSIS — I25119 Atherosclerotic heart disease of native coronary artery with unspecified angina pectoris: Secondary | ICD-10-CM | POA: Diagnosis not present

## 2019-08-16 DIAGNOSIS — I130 Hypertensive heart and chronic kidney disease with heart failure and stage 1 through stage 4 chronic kidney disease, or unspecified chronic kidney disease: Secondary | ICD-10-CM | POA: Diagnosis not present

## 2019-08-16 DIAGNOSIS — Z96651 Presence of right artificial knee joint: Secondary | ICD-10-CM | POA: Diagnosis not present

## 2019-08-16 DIAGNOSIS — Z471 Aftercare following joint replacement surgery: Secondary | ICD-10-CM | POA: Diagnosis not present

## 2019-08-17 DIAGNOSIS — Z471 Aftercare following joint replacement surgery: Secondary | ICD-10-CM | POA: Diagnosis not present

## 2019-08-17 DIAGNOSIS — I130 Hypertensive heart and chronic kidney disease with heart failure and stage 1 through stage 4 chronic kidney disease, or unspecified chronic kidney disease: Secondary | ICD-10-CM | POA: Diagnosis not present

## 2019-08-17 DIAGNOSIS — G43909 Migraine, unspecified, not intractable, without status migrainosus: Secondary | ICD-10-CM | POA: Diagnosis not present

## 2019-08-17 DIAGNOSIS — Z96651 Presence of right artificial knee joint: Secondary | ICD-10-CM | POA: Diagnosis not present

## 2019-08-17 DIAGNOSIS — I25119 Atherosclerotic heart disease of native coronary artery with unspecified angina pectoris: Secondary | ICD-10-CM | POA: Diagnosis not present

## 2019-08-17 DIAGNOSIS — J45909 Unspecified asthma, uncomplicated: Secondary | ICD-10-CM | POA: Diagnosis not present

## 2019-08-21 DIAGNOSIS — M6281 Muscle weakness (generalized): Secondary | ICD-10-CM | POA: Diagnosis not present

## 2019-08-21 DIAGNOSIS — M25561 Pain in right knee: Secondary | ICD-10-CM | POA: Diagnosis not present

## 2019-08-21 DIAGNOSIS — R269 Unspecified abnormalities of gait and mobility: Secondary | ICD-10-CM | POA: Diagnosis not present

## 2019-08-21 DIAGNOSIS — M25661 Stiffness of right knee, not elsewhere classified: Secondary | ICD-10-CM | POA: Diagnosis not present

## 2019-08-21 DIAGNOSIS — Z96651 Presence of right artificial knee joint: Secondary | ICD-10-CM | POA: Diagnosis not present

## 2019-08-24 DIAGNOSIS — M6281 Muscle weakness (generalized): Secondary | ICD-10-CM | POA: Diagnosis not present

## 2019-08-24 DIAGNOSIS — Z96651 Presence of right artificial knee joint: Secondary | ICD-10-CM | POA: Diagnosis not present

## 2019-08-24 DIAGNOSIS — R269 Unspecified abnormalities of gait and mobility: Secondary | ICD-10-CM | POA: Diagnosis not present

## 2019-08-24 DIAGNOSIS — M25661 Stiffness of right knee, not elsewhere classified: Secondary | ICD-10-CM | POA: Diagnosis not present

## 2019-08-24 DIAGNOSIS — M25561 Pain in right knee: Secondary | ICD-10-CM | POA: Diagnosis not present

## 2019-08-28 ENCOUNTER — Ambulatory Visit: Payer: Medicare Other | Admitting: Allergy & Immunology

## 2019-08-28 DIAGNOSIS — M25661 Stiffness of right knee, not elsewhere classified: Secondary | ICD-10-CM | POA: Diagnosis not present

## 2019-08-28 DIAGNOSIS — Z96651 Presence of right artificial knee joint: Secondary | ICD-10-CM | POA: Diagnosis not present

## 2019-08-28 DIAGNOSIS — M25561 Pain in right knee: Secondary | ICD-10-CM | POA: Diagnosis not present

## 2019-08-28 DIAGNOSIS — R269 Unspecified abnormalities of gait and mobility: Secondary | ICD-10-CM | POA: Diagnosis not present

## 2019-08-28 DIAGNOSIS — M6281 Muscle weakness (generalized): Secondary | ICD-10-CM | POA: Diagnosis not present

## 2019-08-31 DIAGNOSIS — M6281 Muscle weakness (generalized): Secondary | ICD-10-CM | POA: Diagnosis not present

## 2019-08-31 DIAGNOSIS — M25561 Pain in right knee: Secondary | ICD-10-CM | POA: Diagnosis not present

## 2019-08-31 DIAGNOSIS — R269 Unspecified abnormalities of gait and mobility: Secondary | ICD-10-CM | POA: Diagnosis not present

## 2019-08-31 DIAGNOSIS — M25661 Stiffness of right knee, not elsewhere classified: Secondary | ICD-10-CM | POA: Diagnosis not present

## 2019-08-31 DIAGNOSIS — Z96651 Presence of right artificial knee joint: Secondary | ICD-10-CM | POA: Diagnosis not present

## 2019-09-03 ENCOUNTER — Other Ambulatory Visit: Payer: Self-pay | Admitting: *Deleted

## 2019-09-03 DIAGNOSIS — M25561 Pain in right knee: Secondary | ICD-10-CM | POA: Diagnosis not present

## 2019-09-03 DIAGNOSIS — M25661 Stiffness of right knee, not elsewhere classified: Secondary | ICD-10-CM | POA: Diagnosis not present

## 2019-09-03 DIAGNOSIS — M6281 Muscle weakness (generalized): Secondary | ICD-10-CM | POA: Diagnosis not present

## 2019-09-03 DIAGNOSIS — R269 Unspecified abnormalities of gait and mobility: Secondary | ICD-10-CM | POA: Diagnosis not present

## 2019-09-03 DIAGNOSIS — Z96651 Presence of right artificial knee joint: Secondary | ICD-10-CM | POA: Diagnosis not present

## 2019-09-03 MED ORDER — LEVOTHYROXINE SODIUM 125 MCG PO TABS: 125.0000 ug | ORAL_TABLET | Freq: Every day | ORAL | 4 refills | Status: DC

## 2019-09-04 ENCOUNTER — Other Ambulatory Visit: Payer: Self-pay

## 2019-09-04 ENCOUNTER — Encounter: Payer: Self-pay | Admitting: Allergy & Immunology

## 2019-09-04 ENCOUNTER — Ambulatory Visit (INDEPENDENT_AMBULATORY_CARE_PROVIDER_SITE_OTHER): Payer: Medicare Other | Admitting: Allergy & Immunology

## 2019-09-04 VITALS — BP 124/76 | HR 88 | Temp 97.7°F | Resp 18

## 2019-09-04 DIAGNOSIS — J3089 Other allergic rhinitis: Secondary | ICD-10-CM | POA: Diagnosis not present

## 2019-09-04 DIAGNOSIS — J4540 Moderate persistent asthma, uncomplicated: Secondary | ICD-10-CM | POA: Diagnosis not present

## 2019-09-04 DIAGNOSIS — K219 Gastro-esophageal reflux disease without esophagitis: Secondary | ICD-10-CM | POA: Diagnosis not present

## 2019-09-04 MED ORDER — MONTELUKAST SODIUM 10 MG PO TABS: 10.0000 mg | ORAL_TABLET | Freq: Every day | ORAL | 1 refills | Status: DC

## 2019-09-04 MED ORDER — HYDROXYZINE HCL 10 MG PO TABS: ORAL_TABLET | ORAL | 1 refills | Status: DC

## 2019-09-04 MED ORDER — ALBUTEROL SULFATE HFA 108 (90 BASE) MCG/ACT IN AERS: 2.0000 | INHALATION_SPRAY | Freq: Four times a day (QID) | RESPIRATORY_TRACT | 1 refills | Status: DC | PRN

## 2019-09-04 MED ORDER — FLUTICASONE PROPIONATE 50 MCG/ACT NA SUSP: NASAL | 3 refills | Status: DC

## 2019-09-04 MED ORDER — OMEPRAZOLE 40 MG PO CPDR: 40.0000 mg | DELAYED_RELEASE_CAPSULE | Freq: Every day | ORAL | 1 refills | Status: DC

## 2019-09-04 NOTE — Patient Instructions (Addendum)
1. Moderate persistent asthma, uncomplicated - Lung testing looks good today. - We are not going to make any changes at this time. - Daily controller medication(s): Dulera 100/83mcg two puffs twice daily with spacer - Prior to physical activity: albuterol 2 puffs 10-15 minutes before physical activity. - Rescue medications: albuterol 4 puffs every 4-6 hours as needed - Asthma control goals:  * Full participation in all desired activities (may need albuterol before activity) * Albuterol use two time or less a week on average (not counting use with activity) * Cough interfering with sleep two time or less a month * Oral steroids no more than once a year * No hospitalizations  2. LPRD (laryngopharyngeal reflux disease) - Continue with omeprazole in the morning. - Continue with Prevacid at night.   3. Allergic rhinitis - Continue with Flonase one spray per nostril daily as needed.  4. Pruritis  - Continue with Atarax 10mg  as needed. - Continue with the lotion as needed.   5. Return in about 4 months (around 01/04/2020). This can be an in-person, a virtual Webex or a telephone follow up visit.   Please inform 01/06/2020 of any Emergency Department visits, hospitalizations, or changes in symptoms. Call us before going to the ED for breathing or allergy symptoms since we might be able to fit you in for a sick visit. Feel free to contact us anytime with any questions, problems, or concerns.  It was a pleasure to meet you today! Good luck with your rehabilitation from your knee surgery!   Websites that have reliable patient information: 1. American Academy of Asthma, Allergy, and Immunology: www.aaaai.org 2. Food Allergy Research and Education (FARE): foodallergy.org 3. Mothers of Asthmatics: http://www.asthmacommunitynetwork.org 4. American College of Allergy, Asthma, and Immunology: www.acaai.org   COVID-19 Vaccine Information can be found at:  [place] For questions related to vaccine distribution or appointments, please email [email or web address] or call [phone].     "Like" [phone] on Facebook and Instagram for our latest updates!       HAPPY SPRING!  Make sure you are registered to vote! If you have moved or changed any of your contact information, you will need to get this updated before voting!  In some cases, you MAY be able to register to vote online: [place]

## 2019-09-04 NOTE — Progress Notes (Signed)
FOLLOW UP  Date of Service/Encounter:  09/04/19   Assessment:   Moderate persistent asthma, uncomplicated  LPRD (laryngopharyngeal reflux disease)  Allergic rhinitis  Plan/Recommendations:   1. Moderate persistent asthma, uncomplicated - Lung testing looks good today. - We are not going to make any changes at this time. - Daily controller medication(s): Dulera 100/92mcg two puffs twice daily with spacer - Prior to physical activity: albuterol 2 puffs 10-15 minutes before physical activity. - Rescue medications: albuterol 4 puffs every 4-6 hours as needed - Asthma control goals:  * Full participation in all desired activities (may need albuterol before activity) * Albuterol use two time or less a week on average (not counting use with activity) * Cough interfering with sleep two time or less a month * Oral steroids no more than once a year * No hospitalizations  2. LPRD (laryngopharyngeal reflux disease) - Continue with omeprazole in the morning. - Continue with Prevacid at night.   3. Allergic rhinitis - Continue with Flonase one spray per nostril daily as needed.  4. Pruritis  - Continue with Atarax 10mg  as needed. - Continue with the lotion as needed.   5. Return in about 4 months (around 01/04/2020). This can be an in-person, a virtual Webex or a telephone follow up visit.  Subjective:   Brooke Schneider is a 69 y.o. female presenting today for follow up of  Chief Complaint  Patient presents with  . Follow-up  . Allergies  . Asthma    73 has a history of the following: Patient Active Problem List   Diagnosis Date Noted  . GERD (gastroesophageal reflux disease) 07/17/2019  . OA (osteoarthritis) of knee 04/13/2019  . Obesity (BMI 30-39.9) 03/13/2019  . Allergic conjunctivitis 09/13/2016  . MDD (major depressive disorder) 09/13/2016  . Tinea versicolor 07/06/2016  . IBS (irritable bowel syndrome) 01/02/2016  . Hypothyroidism 10/17/2015  .  Postop check 11/27/2013  . Gallbladder calculus with acute cholecystitis and no obstruction 11/02/2013  . Migraine 11/27/2012  . Insomnia 07/21/2009  . GOITER, SUBSTERNAL 01/19/2008  . Allergic rhinitis 01/19/2008  . Asthma, allergic 01/19/2008    History obtained from: chart review and patient.  Brooke Schneider is a 69 y.o. female presenting for a follow up visit.  She was last seen in December 2020 by Brooke Schneider 2021.  At that time, she was continued on her combined ICS/LABA 2 puffs twice daily.  It was recommended that she use Nasacort or Flonase 1 spray per nostril up to daily.  She was continued on Singulair.  Omeprazole and famotidine were continued for her reflux.  She does have Atarax to use as needed as well as ProAir and Pazeo.  Since the last visit, she has mostly done well. She did have right knee replacement. She is trying to work at home but with the Rehab she is enduring, it is difficult to get that done.   Asthma/Respiratory Symptom History: Asthma has been well controlled. She is on the [facility] 100/5 two puffs BID. She feels that this worked the most. Brooke Schneider's asthma has been well controlled. She has not required rescue medication, experienced nocturnal awakenings due to lower respiratory symptoms, nor have activities of daily living been limited. She has required no Emergency Department or Urgent Care visits for her asthma. She has required zero courses of systemic steroids for asthma exacerbations since the last visit. ACT score today is 15, indicating excellent asthma symptom control.   Allergic Rhinitis Symptom History: She reports that she  is having a lot of dryness from the Zyrtec. Flonase seems to be working well. She is using the OTC one that [facility] sends her. She does need a new prescription. She does need refills of this. She has not needed any antibiotics at this time.   She is having itching that is controlled with the Atarax as needed She does need a refill of that.    Otherwise, there have been no changes to her past medical history, surgical history, family history, or social history.    Review of Systems  Constitutional: Negative.  Negative for fever, malaise/fatigue and weight loss.  HENT: Negative.  Negative for congestion, ear discharge and ear pain.   Eyes: Negative for pain, discharge and redness.  Respiratory: Negative for cough, sputum production, shortness of breath and wheezing.   Cardiovascular: Negative.  Negative for chest pain and palpitations.  Gastrointestinal: Negative for abdominal pain, heartburn, nausea and vomiting.  Skin: Negative.  Negative for itching and rash.  Neurological: Negative for dizziness and headaches.  Endo/Heme/Allergies: Negative for environmental allergies. Does not bruise/bleed easily.       Objective:   Blood pressure 124/76, pulse 88, temperature 97.7 F (36.5 C), temperature source Temporal, resp. rate 18, SpO2 97 %. There is no height or weight on file to calculate BMI.   Physical Exam:  Physical Exam  Constitutional: She appears well-developed.  Very pleasant and talkative female.  HENT:  Head: Normocephalic and atraumatic.  Right Ear: Tympanic membrane, external ear and ear canal normal.  Left Ear: Tympanic membrane, external ear and ear canal normal.  Nose: Mucosal edema and rhinorrhea present. No nasal deformity or septal deviation. No epistaxis. Right sinus exhibits no maxillary sinus tenderness and no frontal sinus tenderness. Left sinus exhibits no maxillary sinus tenderness and no frontal sinus tenderness.  Mouth/Throat: Uvula is midline and oropharynx is clear and moist. Mucous membranes are not pale and not dry.  Turbinates enlarged bilaterally. There is good air movement throughout.   Eyes: Pupils are equal, round, and reactive to light. Conjunctivae and EOM are normal. Right eye exhibits no chemosis and no discharge. Left eye exhibits no chemosis and no discharge. Right conjunctiva  is not injected. Left conjunctiva is not injected.  Cardiovascular: Normal rate, regular rhythm and normal heart sounds.  Respiratory: Effort normal and breath sounds normal. No accessory muscle usage. No tachypnea. No respiratory distress. She has no wheezes. She has no rhonchi. She has no rales. She exhibits no tenderness.  Moving air well in all lung fields. No increased work of breathing noted.   Lymphadenopathy:    She has no cervical adenopathy.  Neurological: She is alert.  Skin: No abrasion, no petechiae and no rash noted. Rash is not papular, not vesicular and not urticarial. No erythema. No pallor.  No eczematous or urticarial lesions noted.   Psychiatric: She has a normal mood and affect.     Diagnostic studies:    Spirometry: results normal (FEV1: 2.08/105%, FVC: 2.72/107%, FEV1/FVC: 76%).    Spirometry consistent with normal pattern.   Allergy Studies: none       Salvatore Marvel, MD  [facility]

## 2019-09-06 DIAGNOSIS — M25561 Pain in right knee: Secondary | ICD-10-CM | POA: Diagnosis not present

## 2019-09-06 DIAGNOSIS — M6281 Muscle weakness (generalized): Secondary | ICD-10-CM | POA: Diagnosis not present

## 2019-09-06 DIAGNOSIS — Z96651 Presence of right artificial knee joint: Secondary | ICD-10-CM | POA: Diagnosis not present

## 2019-09-06 DIAGNOSIS — M25661 Stiffness of right knee, not elsewhere classified: Secondary | ICD-10-CM | POA: Diagnosis not present

## 2019-09-06 DIAGNOSIS — R269 Unspecified abnormalities of gait and mobility: Secondary | ICD-10-CM | POA: Diagnosis not present

## 2019-09-13 DIAGNOSIS — M25661 Stiffness of right knee, not elsewhere classified: Secondary | ICD-10-CM | POA: Diagnosis not present

## 2019-09-13 DIAGNOSIS — M25561 Pain in right knee: Secondary | ICD-10-CM | POA: Diagnosis not present

## 2019-09-13 DIAGNOSIS — R269 Unspecified abnormalities of gait and mobility: Secondary | ICD-10-CM | POA: Diagnosis not present

## 2019-09-13 DIAGNOSIS — M6281 Muscle weakness (generalized): Secondary | ICD-10-CM | POA: Diagnosis not present

## 2019-09-13 DIAGNOSIS — Z96651 Presence of right artificial knee joint: Secondary | ICD-10-CM | POA: Diagnosis not present

## 2019-09-14 DIAGNOSIS — R269 Unspecified abnormalities of gait and mobility: Secondary | ICD-10-CM | POA: Diagnosis not present

## 2019-09-14 DIAGNOSIS — M25561 Pain in right knee: Secondary | ICD-10-CM | POA: Diagnosis not present

## 2019-09-14 DIAGNOSIS — Z96651 Presence of right artificial knee joint: Secondary | ICD-10-CM | POA: Diagnosis not present

## 2019-09-14 DIAGNOSIS — M6281 Muscle weakness (generalized): Secondary | ICD-10-CM | POA: Diagnosis not present

## 2019-09-14 DIAGNOSIS — M25661 Stiffness of right knee, not elsewhere classified: Secondary | ICD-10-CM | POA: Diagnosis not present

## 2019-09-17 DIAGNOSIS — Z471 Aftercare following joint replacement surgery: Secondary | ICD-10-CM | POA: Diagnosis not present

## 2019-09-17 DIAGNOSIS — Z96651 Presence of right artificial knee joint: Secondary | ICD-10-CM | POA: Diagnosis not present

## 2019-09-17 DIAGNOSIS — M25561 Pain in right knee: Secondary | ICD-10-CM | POA: Diagnosis not present

## 2019-09-20 DIAGNOSIS — Z96651 Presence of right artificial knee joint: Secondary | ICD-10-CM | POA: Diagnosis not present

## 2019-09-20 DIAGNOSIS — M25561 Pain in right knee: Secondary | ICD-10-CM | POA: Diagnosis not present

## 2019-09-20 DIAGNOSIS — R269 Unspecified abnormalities of gait and mobility: Secondary | ICD-10-CM | POA: Diagnosis not present

## 2019-09-20 DIAGNOSIS — M6281 Muscle weakness (generalized): Secondary | ICD-10-CM | POA: Diagnosis not present

## 2019-09-20 DIAGNOSIS — M25661 Stiffness of right knee, not elsewhere classified: Secondary | ICD-10-CM | POA: Diagnosis not present

## 2019-09-21 DIAGNOSIS — R269 Unspecified abnormalities of gait and mobility: Secondary | ICD-10-CM | POA: Diagnosis not present

## 2019-09-21 DIAGNOSIS — Z96651 Presence of right artificial knee joint: Secondary | ICD-10-CM | POA: Diagnosis not present

## 2019-09-21 DIAGNOSIS — M25661 Stiffness of right knee, not elsewhere classified: Secondary | ICD-10-CM | POA: Diagnosis not present

## 2019-09-21 DIAGNOSIS — M25561 Pain in right knee: Secondary | ICD-10-CM | POA: Diagnosis not present

## 2019-09-21 DIAGNOSIS — M6281 Muscle weakness (generalized): Secondary | ICD-10-CM | POA: Diagnosis not present

## 2019-09-27 ENCOUNTER — Other Ambulatory Visit: Payer: Self-pay | Admitting: Family Medicine

## 2019-09-27 DIAGNOSIS — M6281 Muscle weakness (generalized): Secondary | ICD-10-CM | POA: Diagnosis not present

## 2019-09-27 DIAGNOSIS — Z96651 Presence of right artificial knee joint: Secondary | ICD-10-CM | POA: Diagnosis not present

## 2019-09-27 DIAGNOSIS — M25561 Pain in right knee: Secondary | ICD-10-CM | POA: Diagnosis not present

## 2019-09-27 DIAGNOSIS — R269 Unspecified abnormalities of gait and mobility: Secondary | ICD-10-CM | POA: Diagnosis not present

## 2019-09-27 DIAGNOSIS — M25661 Stiffness of right knee, not elsewhere classified: Secondary | ICD-10-CM | POA: Diagnosis not present

## 2019-09-28 DIAGNOSIS — R269 Unspecified abnormalities of gait and mobility: Secondary | ICD-10-CM | POA: Diagnosis not present

## 2019-09-28 DIAGNOSIS — M25561 Pain in right knee: Secondary | ICD-10-CM | POA: Diagnosis not present

## 2019-09-28 DIAGNOSIS — M25661 Stiffness of right knee, not elsewhere classified: Secondary | ICD-10-CM | POA: Diagnosis not present

## 2019-09-28 DIAGNOSIS — Z96651 Presence of right artificial knee joint: Secondary | ICD-10-CM | POA: Diagnosis not present

## 2019-09-28 DIAGNOSIS — M6281 Muscle weakness (generalized): Secondary | ICD-10-CM | POA: Diagnosis not present

## 2019-10-02 DIAGNOSIS — M6281 Muscle weakness (generalized): Secondary | ICD-10-CM | POA: Diagnosis not present

## 2019-10-02 DIAGNOSIS — M25661 Stiffness of right knee, not elsewhere classified: Secondary | ICD-10-CM | POA: Diagnosis not present

## 2019-10-02 DIAGNOSIS — R269 Unspecified abnormalities of gait and mobility: Secondary | ICD-10-CM | POA: Diagnosis not present

## 2019-10-02 DIAGNOSIS — Z96651 Presence of right artificial knee joint: Secondary | ICD-10-CM | POA: Diagnosis not present

## 2019-10-02 DIAGNOSIS — M25561 Pain in right knee: Secondary | ICD-10-CM | POA: Diagnosis not present

## 2019-10-03 ENCOUNTER — Encounter: Payer: Self-pay | Admitting: Family Medicine

## 2019-10-03 ENCOUNTER — Ambulatory Visit (INDEPENDENT_AMBULATORY_CARE_PROVIDER_SITE_OTHER): Payer: Medicare Other | Admitting: Family Medicine

## 2019-10-03 ENCOUNTER — Other Ambulatory Visit: Payer: Self-pay

## 2019-10-03 VITALS — BP 134/90 | HR 90 | Temp 98.5°F | Resp 14 | Ht 65.0 in | Wt 194.0 lb

## 2019-10-03 DIAGNOSIS — G43909 Migraine, unspecified, not intractable, without status migrainosus: Secondary | ICD-10-CM

## 2019-10-03 DIAGNOSIS — K580 Irritable bowel syndrome with diarrhea: Secondary | ICD-10-CM

## 2019-10-03 DIAGNOSIS — K582 Mixed irritable bowel syndrome: Secondary | ICD-10-CM

## 2019-10-03 DIAGNOSIS — Z79899 Other long term (current) drug therapy: Secondary | ICD-10-CM

## 2019-10-03 DIAGNOSIS — J4540 Moderate persistent asthma, uncomplicated: Secondary | ICD-10-CM | POA: Diagnosis not present

## 2019-10-03 MED ORDER — DICYCLOMINE HCL 20 MG PO TABS: ORAL_TABLET | ORAL | 2 refills | Status: DC

## 2019-10-03 MED ORDER — PAZEO 0.7 % OP SOLN: 1.0000 [drp] | Freq: Every day | OPHTHALMIC | 1 refills | Status: DC | PRN

## 2019-10-03 NOTE — Assessment & Plan Note (Signed)
I spent time discussing her medications.  She actually did not have the bottles with her today for still confused.  She is medically met tomorrow for nurse visit and bring all of her bottles to verify exactly what she is taking. Most of her medications actually come from her allergy and asthma specialist. Have any acute exacerbations at this time.

## 2019-10-03 NOTE — Assessment & Plan Note (Signed)
Dicyclomine refilled symptoms are controlled

## 2019-10-03 NOTE — Patient Instructions (Addendum)
Schedule nurse visit for tomorrow with Brooke Schneider  to review medications F/U as previous

## 2019-10-03 NOTE — Progress Notes (Signed)
   Subjective:    Patient ID: Brooke Schneider, female    DOB: [birth year], 69 y.o.   MRN: [id number]  Patient presents for Medication Review/ Refill (is not fasting)  Patient here with concern for A visit in February which revealed her chronic medical problems and her medications.  Hypothyroidism- taking levothyroxine 115mg once a day , she is due for repeat TSH, her last level was 4.61 in Oct   Chronic knee pain- followed by ortho ,  2/19, taking norco for pain   she is not using ultcet    [facility]- [facility] , she also has albuterol, she is also on eye drops she needs refill   also seen by Dr GErnst Bowler, has both nasacort/ flonase   Migraines- she is taking topamax '50mg'$  in the morning and '100mg'$  Bedtime.She has not been taking the maxalt. She also has propranolol which helps     Chronic insomnia- Trazodone did not help sleep, wants to restart ambien , there was also no help with mood, she finds herself waking up lot . Still taking wellbutrin for depression but states she is always tired, doesn't have energy, still feels depressed, shei s not sleeping well per above, and this affects her mood   GERD- she has prilosec at home , she has pepcid but not taking   Has chronic diarrhea- needs IBS   COVID-19 vaccine    Review Of Systems:  GEN- denies fatigue, fever, weight loss,weakness, recent illness HEENT- denies eye drainage, change in vision, nasal discharge, CVS- denies chest pain, palpitations RESP- denies SOB, cough, wheeze ABD- denies N/V, change in stools, abd pain GU- denies dysuria, hematuria, dribbling, incontinence MSK- denies joint pain, muscle aches, injury Neuro- denies headache, dizziness, syncope, seizure activity       Objective:    BP 134/90   Pulse 90   Temp 98.5 F (36.9 C) (Temporal)   Resp 14   Ht '5\' 5"'$  (1.651 m)   Wt 194 lb (88 kg)   SpO2 99%   BMI 32.28 kg/m  GEN- NAD, alert and oriented x3 HEENT- PERRL,  EOMI, non injected sclera, pink conjunctiva, MMM, oropharynx clear Neck- Supple, no thyromegaly CVS- RRR, no murmur RESP-CTAB EXT- No edema Pulses- Radial, DP- 2+        Assessment & Plan:      Problem List Items Addressed This Visit      Unprioritized   Asthma, allergic    I spent time discussing her medications.  She actually did not have the bottles with her today for still confused.  She is medically met tomorrow for nurse visit and bring all of her bottles to verify exactly what she is taking. Most of her medications actually come from her allergy and asthma specialist. Have any acute exacerbations at this time.      IBS (irritable bowel syndrome)    Dicyclomine refilled symptoms are controlled      Relevant Medications   dicyclomine (BENTYL) 20 MG tablet   Migraine    Migraines have been controlled with medications.  She has not required her Maxalt       Other Visit Diagnoses    Medication management    -  Primary      Note: This dictation was prepared with Dragon dictation along with smaller phrase technology. Any transcriptional errors that result from this process are unintentional.

## 2019-10-03 NOTE — Assessment & Plan Note (Signed)
Migraines have been controlled with medications.  She has not required her Maxalt

## 2019-10-04 ENCOUNTER — Telehealth: Payer: Self-pay | Admitting: Family Medicine

## 2019-10-04 ENCOUNTER — Ambulatory Visit: Payer: Medicare Other

## 2019-10-04 DIAGNOSIS — M6281 Muscle weakness (generalized): Secondary | ICD-10-CM | POA: Diagnosis not present

## 2019-10-04 DIAGNOSIS — R269 Unspecified abnormalities of gait and mobility: Secondary | ICD-10-CM | POA: Diagnosis not present

## 2019-10-04 DIAGNOSIS — M25661 Stiffness of right knee, not elsewhere classified: Secondary | ICD-10-CM | POA: Diagnosis not present

## 2019-10-04 DIAGNOSIS — M25561 Pain in right knee: Secondary | ICD-10-CM | POA: Diagnosis not present

## 2019-10-04 DIAGNOSIS — Z96651 Presence of right artificial knee joint: Secondary | ICD-10-CM | POA: Diagnosis not present

## 2019-10-04 DIAGNOSIS — Z79899 Other long term (current) drug therapy: Secondary | ICD-10-CM

## 2019-10-04 NOTE — Telephone Encounter (Signed)
Patient came in for reconciliation of her medications. Medications patient brought in and how she takes them matches the medication list in epic. Patient had some concerns about Topmax. She states that she was switched to 1/2 tablet (50 MG) in the morning and 1 whole tablet (100 MG) in the evening and she feels that the medication is not as effective. She also had questions about the Ambien as she feels that with just 10 MG she is not sleeping well. Please advise?

## 2019-10-04 NOTE — Progress Notes (Signed)
Patient came in for reconciliation of her medications. Medications patient brought in and how she takes them matches the medication list in epic. Patient had some concerns about Topmax. She states that she was switched to 1/2 tablet (50 MG) in the morning and 1 whole tablet (100 MG) in the evening and she feels that the medication is not as effective. She also had questions about the Ambien as she feels that with just 10 MG she is not sleeping well. Will send a telephone call to patient's provider with this information.

## 2019-10-05 NOTE — Telephone Encounter (Signed)
Call pt Wither her other medications I do not recommend any change to the [place] She can stop taking if she doesn't feel it helps She was given atarax by the allergy this can be used in the evening ( since its twice a day as needed) for sleep  For the topamax, she can go back to 1 full tablet (100mg ) BID to see if this helps the headaches If it doesn't she will need to see her neurologist again.

## 2019-10-05 NOTE — Telephone Encounter (Signed)
Left message return call

## 2019-10-11 DIAGNOSIS — Z96651 Presence of right artificial knee joint: Secondary | ICD-10-CM | POA: Diagnosis not present

## 2019-10-11 DIAGNOSIS — M25661 Stiffness of right knee, not elsewhere classified: Secondary | ICD-10-CM | POA: Diagnosis not present

## 2019-10-11 DIAGNOSIS — M25561 Pain in right knee: Secondary | ICD-10-CM | POA: Diagnosis not present

## 2019-10-11 DIAGNOSIS — R269 Unspecified abnormalities of gait and mobility: Secondary | ICD-10-CM | POA: Diagnosis not present

## 2019-10-11 DIAGNOSIS — M6281 Muscle weakness (generalized): Secondary | ICD-10-CM | POA: Diagnosis not present

## 2019-10-12 DIAGNOSIS — M25561 Pain in right knee: Secondary | ICD-10-CM | POA: Diagnosis not present

## 2019-10-12 DIAGNOSIS — M6281 Muscle weakness (generalized): Secondary | ICD-10-CM | POA: Diagnosis not present

## 2019-10-12 DIAGNOSIS — M25661 Stiffness of right knee, not elsewhere classified: Secondary | ICD-10-CM | POA: Diagnosis not present

## 2019-10-12 DIAGNOSIS — R269 Unspecified abnormalities of gait and mobility: Secondary | ICD-10-CM | POA: Diagnosis not present

## 2019-10-12 DIAGNOSIS — Z96651 Presence of right artificial knee joint: Secondary | ICD-10-CM | POA: Diagnosis not present

## 2019-10-12 NOTE — Telephone Encounter (Signed)
Spoke with patient and informed her of medication changes and recommendations by Dr. Jeanice Lim. Patient verbalized understanding.

## 2019-10-17 DIAGNOSIS — Z96651 Presence of right artificial knee joint: Secondary | ICD-10-CM | POA: Diagnosis not present

## 2019-10-17 DIAGNOSIS — M6281 Muscle weakness (generalized): Secondary | ICD-10-CM | POA: Diagnosis not present

## 2019-10-17 DIAGNOSIS — M25661 Stiffness of right knee, not elsewhere classified: Secondary | ICD-10-CM | POA: Diagnosis not present

## 2019-10-17 DIAGNOSIS — M25561 Pain in right knee: Secondary | ICD-10-CM | POA: Diagnosis not present

## 2019-10-17 DIAGNOSIS — R269 Unspecified abnormalities of gait and mobility: Secondary | ICD-10-CM | POA: Diagnosis not present

## 2019-10-24 DIAGNOSIS — M25561 Pain in right knee: Secondary | ICD-10-CM | POA: Diagnosis not present

## 2019-10-24 DIAGNOSIS — M25661 Stiffness of right knee, not elsewhere classified: Secondary | ICD-10-CM | POA: Diagnosis not present

## 2019-10-24 DIAGNOSIS — Z96651 Presence of right artificial knee joint: Secondary | ICD-10-CM | POA: Diagnosis not present

## 2019-10-24 DIAGNOSIS — M6281 Muscle weakness (generalized): Secondary | ICD-10-CM | POA: Diagnosis not present

## 2019-10-24 DIAGNOSIS — R269 Unspecified abnormalities of gait and mobility: Secondary | ICD-10-CM | POA: Diagnosis not present

## 2019-10-26 DIAGNOSIS — M25561 Pain in right knee: Secondary | ICD-10-CM | POA: Diagnosis not present

## 2019-10-26 DIAGNOSIS — M6281 Muscle weakness (generalized): Secondary | ICD-10-CM | POA: Diagnosis not present

## 2019-10-26 DIAGNOSIS — Z96651 Presence of right artificial knee joint: Secondary | ICD-10-CM | POA: Diagnosis not present

## 2019-10-26 DIAGNOSIS — M25661 Stiffness of right knee, not elsewhere classified: Secondary | ICD-10-CM | POA: Diagnosis not present

## 2019-10-26 DIAGNOSIS — R269 Unspecified abnormalities of gait and mobility: Secondary | ICD-10-CM | POA: Diagnosis not present

## 2019-10-31 DIAGNOSIS — M25661 Stiffness of right knee, not elsewhere classified: Secondary | ICD-10-CM | POA: Diagnosis not present

## 2019-10-31 DIAGNOSIS — R269 Unspecified abnormalities of gait and mobility: Secondary | ICD-10-CM | POA: Diagnosis not present

## 2019-10-31 DIAGNOSIS — M6281 Muscle weakness (generalized): Secondary | ICD-10-CM | POA: Diagnosis not present

## 2019-10-31 DIAGNOSIS — Z96651 Presence of right artificial knee joint: Secondary | ICD-10-CM | POA: Diagnosis not present

## 2019-10-31 DIAGNOSIS — M25561 Pain in right knee: Secondary | ICD-10-CM | POA: Diagnosis not present

## 2019-11-01 DIAGNOSIS — Z96651 Presence of right artificial knee joint: Secondary | ICD-10-CM | POA: Diagnosis not present

## 2019-11-01 DIAGNOSIS — M25561 Pain in right knee: Secondary | ICD-10-CM | POA: Diagnosis not present

## 2019-11-01 DIAGNOSIS — R269 Unspecified abnormalities of gait and mobility: Secondary | ICD-10-CM | POA: Diagnosis not present

## 2019-11-01 DIAGNOSIS — M25661 Stiffness of right knee, not elsewhere classified: Secondary | ICD-10-CM | POA: Diagnosis not present

## 2019-11-01 DIAGNOSIS — M6281 Muscle weakness (generalized): Secondary | ICD-10-CM | POA: Diagnosis not present

## 2019-11-08 DIAGNOSIS — R269 Unspecified abnormalities of gait and mobility: Secondary | ICD-10-CM | POA: Diagnosis not present

## 2019-11-08 DIAGNOSIS — M25661 Stiffness of right knee, not elsewhere classified: Secondary | ICD-10-CM | POA: Diagnosis not present

## 2019-11-08 DIAGNOSIS — M25561 Pain in right knee: Secondary | ICD-10-CM | POA: Diagnosis not present

## 2019-11-08 DIAGNOSIS — Z96651 Presence of right artificial knee joint: Secondary | ICD-10-CM | POA: Diagnosis not present

## 2019-11-08 DIAGNOSIS — M6281 Muscle weakness (generalized): Secondary | ICD-10-CM | POA: Diagnosis not present

## 2019-11-09 DIAGNOSIS — M25561 Pain in right knee: Secondary | ICD-10-CM | POA: Diagnosis not present

## 2019-11-09 DIAGNOSIS — M6281 Muscle weakness (generalized): Secondary | ICD-10-CM | POA: Diagnosis not present

## 2019-11-09 DIAGNOSIS — R269 Unspecified abnormalities of gait and mobility: Secondary | ICD-10-CM | POA: Diagnosis not present

## 2019-11-09 DIAGNOSIS — M25661 Stiffness of right knee, not elsewhere classified: Secondary | ICD-10-CM | POA: Diagnosis not present

## 2019-11-09 DIAGNOSIS — Z96651 Presence of right artificial knee joint: Secondary | ICD-10-CM | POA: Diagnosis not present

## 2019-11-13 DIAGNOSIS — M6281 Muscle weakness (generalized): Secondary | ICD-10-CM | POA: Diagnosis not present

## 2019-11-13 DIAGNOSIS — M25661 Stiffness of right knee, not elsewhere classified: Secondary | ICD-10-CM | POA: Diagnosis not present

## 2019-11-13 DIAGNOSIS — M25561 Pain in right knee: Secondary | ICD-10-CM | POA: Diagnosis not present

## 2019-11-13 DIAGNOSIS — Z96651 Presence of right artificial knee joint: Secondary | ICD-10-CM | POA: Diagnosis not present

## 2019-11-13 DIAGNOSIS — R269 Unspecified abnormalities of gait and mobility: Secondary | ICD-10-CM | POA: Diagnosis not present

## 2019-11-14 ENCOUNTER — Ambulatory Visit: Payer: Medicare Other | Admitting: Family Medicine

## 2019-11-14 DIAGNOSIS — M25561 Pain in right knee: Secondary | ICD-10-CM | POA: Diagnosis not present

## 2019-11-14 DIAGNOSIS — Z471 Aftercare following joint replacement surgery: Secondary | ICD-10-CM | POA: Diagnosis not present

## 2019-11-14 DIAGNOSIS — Z96651 Presence of right artificial knee joint: Secondary | ICD-10-CM | POA: Diagnosis not present

## 2019-11-15 DIAGNOSIS — R269 Unspecified abnormalities of gait and mobility: Secondary | ICD-10-CM | POA: Diagnosis not present

## 2019-11-15 DIAGNOSIS — M25661 Stiffness of right knee, not elsewhere classified: Secondary | ICD-10-CM | POA: Diagnosis not present

## 2019-11-15 DIAGNOSIS — M6281 Muscle weakness (generalized): Secondary | ICD-10-CM | POA: Diagnosis not present

## 2019-11-15 DIAGNOSIS — Z96651 Presence of right artificial knee joint: Secondary | ICD-10-CM | POA: Diagnosis not present

## 2019-11-15 DIAGNOSIS — M25561 Pain in right knee: Secondary | ICD-10-CM | POA: Diagnosis not present

## 2019-11-20 ENCOUNTER — Ambulatory Visit (INDEPENDENT_AMBULATORY_CARE_PROVIDER_SITE_OTHER): Payer: Medicare Other | Admitting: Allergy and Immunology

## 2019-11-20 ENCOUNTER — Other Ambulatory Visit: Payer: Self-pay

## 2019-11-20 VITALS — BP 118/84 | HR 80 | Temp 97.4°F | Resp 16 | Wt 192.8 lb

## 2019-11-20 DIAGNOSIS — J3089 Other allergic rhinitis: Secondary | ICD-10-CM

## 2019-11-20 DIAGNOSIS — K219 Gastro-esophageal reflux disease without esophagitis: Secondary | ICD-10-CM | POA: Diagnosis not present

## 2019-11-20 DIAGNOSIS — L299 Pruritus, unspecified: Secondary | ICD-10-CM | POA: Diagnosis not present

## 2019-11-20 DIAGNOSIS — G43909 Migraine, unspecified, not intractable, without status migrainosus: Secondary | ICD-10-CM

## 2019-11-20 DIAGNOSIS — J4540 Moderate persistent asthma, uncomplicated: Secondary | ICD-10-CM

## 2019-11-20 MED ORDER — OMEPRAZOLE 40 MG PO CPDR: 40.0000 mg | DELAYED_RELEASE_CAPSULE | Freq: Every morning | ORAL | 1 refills | Status: DC

## 2019-11-20 MED ORDER — HYDROXYZINE HCL 10 MG PO TABS: ORAL_TABLET | ORAL | 1 refills | Status: DC

## 2019-11-20 MED ORDER — FAMOTIDINE 40 MG PO TABS: 40.0000 mg | ORAL_TABLET | Freq: Every evening | ORAL | 1 refills | Status: DC

## 2019-11-20 NOTE — Progress Notes (Signed)
[place] [facility] [place] [facility] [place]   Follow-up Note  Referring Provider: Buelah Schneider, Brooke Nunnery, MD Primary Provider: Alycia Rossetti, MD Date of Office Visit: 11/20/2019  Subjective:   Brooke Schneider (DOB: 1950/11/07) is a 69 y.o. female who returns to the [place] on 11/20/2019 in re-evaluation of the following:  HPI: Brooke Schneider returns to this clinic in reevaluation of asthma and allergic rhinitis and LPR and nephritic disorder.  Her last visit to this clinic was 04 September 2019 with Dr. Ernst Schneider.  I last saw her in his clinic 22 May 2019.  Apparently she had a "viral infection" last month that was associated with diarrhea and vomiting and some possible fever for which she went to see her primary care doctor might have been treated with some type of medication.  Otherwise, she has done very well with her overall health.  She has had very little issues with her airway.  She has been using her Dulera just 1 time per day and while doing so she has a requirement for bronchodilators about twice a week.  Her nose has been doing well when using Flonase and montelukast.  Her reflux is doing well while using omeprazole every day and taking famotidine on occasion.  She does relate a history of having a headache that is on the top of her head and sometimes on her face that is pounding and is associated with dizziness.  This happens about twice a week and she will take Atarax or Tylenol and usually go to sleep.  She does have a history of migraine and has been given Maxalt in the past by a neurologist.  She drinks 1 coffee in the morning has 4 cherry cokes throughout the day.  Occasionally she has a little bit of pruritus for which she will take Atarax which works well.  She has obtained to [employer] vaccinations.  Allergies as of 11/20/2019      Reactions   Sulfamethoxazole-trimethoprim Swelling   Metronidazole Itching, Rash      Medication List        AeroChamber Plus inhaler Use as instructed   albuterol 108 (90 Base) MCG/ACT inhaler Commonly known as: VENTOLIN HFA Inhale 2 puffs into the lungs every 6 (six) hours as needed for wheezing or shortness of breath.   buPROPion 300 MG 24 hr tablet Commonly known as: WELLBUTRIN XL TAKE 1 TABLET DAILY (DOSE CHANGE)   Calcium 600+D 600-400 MG-UNIT tablet Generic drug: Calcium Carbonate-Vitamin D Take 1 tablet by mouth 2 (two) times daily.   clotrimazole 1 % cream Commonly known as: LOTRIMIN Apply topically.   dicyclomine 20 MG tablet Commonly known as: BENTYL TAKE 1 TABLET FOUR TIMES A DAY BEFORE MEALS AND AT BEDTIME   Divigel 0.25 MG/0.25GM Gel Generic drug: Estradiol Apply 1 application topically daily. Thigh area   Dulera 200-5 MCG/ACT Aero Generic drug: mometasone-formoterol Inhale 2 puffs into the lungs 2 (two) times daily.   famotidine 40 MG tablet Commonly known as: PEPCID TAKE 1 TABLET ONCE DAILY.   fluticasone 50 MCG/ACT nasal spray Commonly known as: FLONASE One spray each nostril daily as needed   HYDROcodone-acetaminophen 5-325 MG tablet Commonly known as: NORCO/VICODIN   hydrOXYzine 10 MG tablet Commonly known as: ATARAX/VISTARIL Take one tablet twice daily if needed   levothyroxine 125 MCG tablet Commonly known as: SYNTHROID Take 1 tablet (125 mcg total) by mouth daily.   montelukast 10 MG tablet Commonly known as: SINGULAIR TAKE  1 TABLET AT BEDTIME   multivitamin with minerals tablet Take 1 tablet by mouth daily.   omeprazole 40 MG capsule Commonly known as: PRILOSEC Take 1 capsule (40 mg total) by mouth daily.   Pazeo 0.7 % Soln Generic drug: Olopatadine HCl Place 1 drop into both eyes daily as needed.   propranolol ER 60 MG 24 hr capsule Commonly known as: INDERAL LA Take 1 capsule (60 mg total) by mouth daily.   rizatriptan 10 MG tablet Commonly known as: MAXALT Take 1 tablet (10 mg total) by mouth as needed for migraine.  Reported on 08/12/2015   topiramate 100 MG tablet Commonly known as: TOPAMAX TAKE 50 MG IN THE MORNING AND 100 MG AT BEDTIME   triamcinolone 55 MCG/ACT Aero nasal inhaler Commonly known as: NASACORT 2 sprays by Both Nostrils route daily.   zolpidem 10 MG tablet Commonly known as: AMBIEN Take 1 tablet (10 mg total) by mouth at bedtime as needed. for sleep       Past Medical History:  Diagnosis Date  . Allergic rhinitis   . Asthma   . Depression   . Goiter   . Goiter   . YIRSWNIO(270.3)     Past Surgical History:  Procedure Laterality Date  . ABDOMINAL HYSTERECTOMY    . CHOLECYSTECTOMY N/A 11/02/2013   Procedure: LAPAROSCOPIC CHOLECYSTECTOMY ;  Surgeon: Brooke Ridges, MD;  Location: [facility] OR;  Service: General;  Laterality: N/A;  . KNEE SURGERY    . THYROIDECTOMY      Review of systems negative except as noted in HPI / PMHx or noted below:  Review of Systems  Constitutional: Negative.   HENT: Negative.   Eyes: Negative.   Respiratory: Negative.   Cardiovascular: Negative.   Gastrointestinal: Negative.   Genitourinary: Negative.   Musculoskeletal: Negative.   Skin: Negative.   Neurological: Negative.   Endo/Heme/Allergies: Negative.   Psychiatric/Behavioral: Negative.      Objective:   Vitals:   11/20/19 1504  BP: 118/84  Pulse: 80  Resp: 16  Temp: (!) 97.4 F (36.3 C)  SpO2: 98%      Weight: 192 lb 12.8 oz (87.5 kg)   Physical Exam Constitutional:      Appearance: She is not diaphoretic.  HENT:     Head: Normocephalic.     Right Ear: Tympanic membrane, ear canal and external ear normal.     Left Ear: Tympanic membrane, ear canal and external ear normal.     Nose: Nose normal. No mucosal edema or rhinorrhea.     Mouth/Throat:     Pharynx: Uvula midline. No oropharyngeal exudate.  Eyes:     Conjunctiva/sclera: Conjunctivae normal.  Neck:     Thyroid: No thyromegaly.     Trachea: Trachea normal. No tracheal tenderness or tracheal deviation.    Cardiovascular:     Rate and Rhythm: Normal rate and regular rhythm.     Heart sounds: Normal heart sounds, S1 normal and S2 normal. No murmur heard.   Pulmonary:     Effort: No respiratory distress.     Breath sounds: Normal breath sounds. No stridor. No wheezing or rales.  Lymphadenopathy:     Head:     Right side of head: No tonsillar adenopathy.     Left side of head: No tonsillar adenopathy.     Cervical: No cervical adenopathy.  Skin:    Findings: No erythema or rash.     Nails: There is no clubbing.  Neurological:  Mental Status: She is alert.     Diagnostics:    Spirometry was performed and demonstrated an FEV1 of 2.04 at 105 % of predicted.  The patient had an Asthma Control Test with the following results: ACT Total Score: 10.    Assessment and Plan:   1. Moderate persistent asthma, uncomplicated   2. Other allergic rhinitis   3. LPRD (laryngopharyngeal reflux disease)   4. Pruritic disorder   5. Migraine syndrome     1. Continue to treat and prevent inflammation:   A. Dulera 100 - 2 inhalations 1-2 times per day with spacer.   B. Flonase - one spray each nostril 1-7 times a week  C. Singulair 10 mg - one tablet one time per day  2.  Continue to treat and prevent reflux:   A. omeprazole 40 mg in a.m.  B. Famotidine 40 mg in PM  3. Treat and prevent headaches:   A. Slowly consolidate caffeine consumption  3. If needed:   A. Antihistamine - Atarax 10mg  - one tablet every 8 hours   B. ProAir HFA or similar - 2 inhalations every 4-6 hours  C. Pazeo - one drop each eye one time per day  4.  Return to clinic 8 weeks or earlier if problem  Kambri appears to be doing okay with her airway issue which is a combination of inflammation and reflux induced respiratory disease and her pruritic disorder appears to be stable.  However, she has headaches.  I will assume that these headaches are some form of migraine and will have her slowly consolidate her  caffeine consumption over the course of the next 8 weeks.  If she still continues to have headaches in the face of this approach she will definitely require further evaluation and treatment.  Nelva Bush, MD Allergy / Immunology [facility]

## 2019-11-20 NOTE — Patient Instructions (Addendum)
°  1. Continue to treat and prevent inflammation:   A. Dulera 100 - 2 inhalations 1-2 times per day with spacer.   B. Flonase - one spray each nostril 1-7 times a week  C. Singulair 10 mg - one tablet one time per day  2.  Continue to treat and prevent reflux:   A. omeprazole 40 mg in a.m.  B. Famotidine 40 mg in PM  3. Treat and prevent headaches:   A. Slowly consolidate caffeine consumption  3. If needed:   A. Antihistamine - Atarax 10mg  - one tablet every 8 hours   B. ProAir HFA or similar - 2 inhalations every 4-6 hours  C. Pazeo - one drop each eye one time per day  4.  Return to clinic 8 weeks or earlier if problem

## 2019-11-21 ENCOUNTER — Encounter: Payer: Self-pay | Admitting: Allergy and Immunology

## 2019-11-30 DIAGNOSIS — M25661 Stiffness of right knee, not elsewhere classified: Secondary | ICD-10-CM | POA: Diagnosis not present

## 2019-11-30 DIAGNOSIS — M25561 Pain in right knee: Secondary | ICD-10-CM | POA: Diagnosis not present

## 2019-11-30 DIAGNOSIS — Z96651 Presence of right artificial knee joint: Secondary | ICD-10-CM | POA: Diagnosis not present

## 2019-11-30 DIAGNOSIS — M6281 Muscle weakness (generalized): Secondary | ICD-10-CM | POA: Diagnosis not present

## 2019-11-30 DIAGNOSIS — R269 Unspecified abnormalities of gait and mobility: Secondary | ICD-10-CM | POA: Diagnosis not present

## 2019-12-03 DIAGNOSIS — M25561 Pain in right knee: Secondary | ICD-10-CM | POA: Diagnosis not present

## 2019-12-03 DIAGNOSIS — M6281 Muscle weakness (generalized): Secondary | ICD-10-CM | POA: Diagnosis not present

## 2019-12-03 DIAGNOSIS — M25661 Stiffness of right knee, not elsewhere classified: Secondary | ICD-10-CM | POA: Diagnosis not present

## 2019-12-03 DIAGNOSIS — R269 Unspecified abnormalities of gait and mobility: Secondary | ICD-10-CM | POA: Diagnosis not present

## 2019-12-03 DIAGNOSIS — Z96651 Presence of right artificial knee joint: Secondary | ICD-10-CM | POA: Diagnosis not present

## 2019-12-05 DIAGNOSIS — M6281 Muscle weakness (generalized): Secondary | ICD-10-CM | POA: Diagnosis not present

## 2019-12-05 DIAGNOSIS — M25661 Stiffness of right knee, not elsewhere classified: Secondary | ICD-10-CM | POA: Diagnosis not present

## 2019-12-05 DIAGNOSIS — R269 Unspecified abnormalities of gait and mobility: Secondary | ICD-10-CM | POA: Diagnosis not present

## 2019-12-05 DIAGNOSIS — M25561 Pain in right knee: Secondary | ICD-10-CM | POA: Diagnosis not present

## 2019-12-05 DIAGNOSIS — Z96651 Presence of right artificial knee joint: Secondary | ICD-10-CM | POA: Diagnosis not present

## 2019-12-07 ENCOUNTER — Other Ambulatory Visit: Payer: Self-pay | Admitting: Family Medicine

## 2019-12-07 ENCOUNTER — Ambulatory Visit (INDEPENDENT_AMBULATORY_CARE_PROVIDER_SITE_OTHER): Payer: Medicare Other | Admitting: Family Medicine

## 2019-12-07 ENCOUNTER — Other Ambulatory Visit: Payer: Self-pay

## 2019-12-07 ENCOUNTER — Encounter: Payer: Self-pay | Admitting: Family Medicine

## 2019-12-07 VITALS — BP 120/80 | HR 73 | Temp 97.9°F | Ht 60.0 in | Wt 191.0 lb

## 2019-12-07 DIAGNOSIS — R21 Rash and other nonspecific skin eruption: Secondary | ICD-10-CM | POA: Diagnosis not present

## 2019-12-07 DIAGNOSIS — G43909 Migraine, unspecified, not intractable, without status migrainosus: Secondary | ICD-10-CM

## 2019-12-07 DIAGNOSIS — R269 Unspecified abnormalities of gait and mobility: Secondary | ICD-10-CM | POA: Diagnosis not present

## 2019-12-07 DIAGNOSIS — M25661 Stiffness of right knee, not elsewhere classified: Secondary | ICD-10-CM | POA: Diagnosis not present

## 2019-12-07 DIAGNOSIS — M6281 Muscle weakness (generalized): Secondary | ICD-10-CM | POA: Diagnosis not present

## 2019-12-07 DIAGNOSIS — Z96651 Presence of right artificial knee joint: Secondary | ICD-10-CM | POA: Diagnosis not present

## 2019-12-07 DIAGNOSIS — M25561 Pain in right knee: Secondary | ICD-10-CM | POA: Diagnosis not present

## 2019-12-07 DIAGNOSIS — B360 Pityriasis versicolor: Secondary | ICD-10-CM

## 2019-12-07 MED ORDER — TOPIRAMATE 100 MG PO TABS: ORAL_TABLET | ORAL | 3 refills | Status: DC

## 2019-12-07 MED ORDER — ZOLPIDEM TARTRATE 10 MG PO TABS: 10.0000 mg | ORAL_TABLET | Freq: Every evening | ORAL | 1 refills | Status: DC | PRN

## 2019-12-07 MED ORDER — BUPROPION HCL ER (XL) 300 MG PO TB24: ORAL_TABLET | ORAL | 4 refills | Status: DC

## 2019-12-07 MED ORDER — LEVOTHYROXINE SODIUM 125 MCG PO TABS: 125.0000 ug | ORAL_TABLET | Freq: Every day | ORAL | 0 refills | Status: DC

## 2019-12-07 MED ORDER — RIZATRIPTAN BENZOATE 10 MG PO TABS: 10.0000 mg | ORAL_TABLET | ORAL | 2 refills | Status: DC | PRN

## 2019-12-07 MED ORDER — KETOCONAZOLE 2 % EX SHAM
1.0000 | MEDICATED_SHAMPOO | CUTANEOUS | 2 refills | Status: DC
Start: 2019-12-10 — End: 2021-11-09

## 2019-12-07 NOTE — Assessment & Plan Note (Signed)
recurrent on chest, resolves with  nizoral will restart topical therapy, script sent

## 2019-12-07 NOTE — Patient Instructions (Addendum)
Referral to dermatology  Medications  F/U 4 months for Physical

## 2019-12-07 NOTE — Assessment & Plan Note (Signed)
maxalt refilled Continue topamax May benefit from some of the newer agents such as Amovig she will discuss with her neurologist No acute migraine today

## 2019-12-07 NOTE — Progress Notes (Signed)
   Subjective:    Patient ID: Brooke Schneider, female    DOB: May 01, 1951, 69 y.o.   MRN: [id number]  Patient presents for Headache  Migraines -increased  Over the past week, she was out of maxalt , she is taking topamax, she is scheduled to see her neurologist, but was holding until she got her knee taken care of    She had recent knee surgery and has a PT protocal,she still has some deficiencies with ROM   She has a darking spots on left cheek , she has some itching of the skin  she has tried lotrimen, using OTC skin cleansers  Would like referral to dermatology   No asthma issues today         Review Of Systems:  GEN- denies fatigue, fever, weight loss,weakness, recent illness HEENT- denies eye drainage, change in vision, nasal discharge, CVS- denies chest pain, palpitations RESP- denies SOB, cough, wheeze ABD- denies N/V, change in stools, abd pain GU- denies dysuria, hematuria, dribbling, incontinence MSK- + joint pain, muscle aches, injury Neuro- + headache, denies  dizziness, syncope, seizure activity       Objective:    BP 120/80   Pulse 73   Temp 97.9 F (36.6 C) (Oral)   Ht 5' (1.524 m)   Wt 191 lb (86.6 kg)   SpO2 99%   BMI 37.30 kg/m  GEN- NAD, alert and oriented x3 HEENT- PERRL, EOMI, non injected sclera, pink conjunctiva, MMM, oropharynx clear Neck- Supple, CVS- RRR, no murmur RESP-CTAB NEURO-CNII-XII in tact no focal deficits  EXT- trace right ankle edema and mild swelling right knee  Pulses- Radial, DP- 2+        Assessment & Plan:      Problem List Items Addressed This Visit      Unprioritized   Migraine    maxalt refilled Continue topamax May benefit from some of the newer agents such as Amovig she will discuss with her neurologist No acute migraine today       Relevant Medications   rizatriptan (MAXALT) 10 MG tablet   topiramate (TOPAMAX) 100 MG tablet   buPROPion (WELLBUTRIN XL) 300 MG 24 hr tablet   Tinea versicolor - Primary     recurrent on chest, resolves with  nizoral will restart topical therapy, script sent       Relevant Orders   Ambulatory referral to Dermatology    Other Visit Diagnoses    Facial rash       Hyperpigmented rash on face, has tried may OTC topicals   Relevant Orders   Ambulatory referral to Dermatology      Note: This dictation was prepared with Dragon dictation along with smaller phrase technology. Any transcriptional errors that result from this process are unintentional.

## 2019-12-11 DIAGNOSIS — R269 Unspecified abnormalities of gait and mobility: Secondary | ICD-10-CM | POA: Diagnosis not present

## 2019-12-11 DIAGNOSIS — M6281 Muscle weakness (generalized): Secondary | ICD-10-CM | POA: Diagnosis not present

## 2019-12-11 DIAGNOSIS — M25661 Stiffness of right knee, not elsewhere classified: Secondary | ICD-10-CM | POA: Diagnosis not present

## 2019-12-11 DIAGNOSIS — Z96651 Presence of right artificial knee joint: Secondary | ICD-10-CM | POA: Diagnosis not present

## 2019-12-11 DIAGNOSIS — M25561 Pain in right knee: Secondary | ICD-10-CM | POA: Diagnosis not present

## 2019-12-13 DIAGNOSIS — M25561 Pain in right knee: Secondary | ICD-10-CM | POA: Diagnosis not present

## 2019-12-13 DIAGNOSIS — R269 Unspecified abnormalities of gait and mobility: Secondary | ICD-10-CM | POA: Diagnosis not present

## 2019-12-13 DIAGNOSIS — M6281 Muscle weakness (generalized): Secondary | ICD-10-CM | POA: Diagnosis not present

## 2019-12-13 DIAGNOSIS — M25661 Stiffness of right knee, not elsewhere classified: Secondary | ICD-10-CM | POA: Diagnosis not present

## 2019-12-13 DIAGNOSIS — Z96651 Presence of right artificial knee joint: Secondary | ICD-10-CM | POA: Diagnosis not present

## 2019-12-14 DIAGNOSIS — R269 Unspecified abnormalities of gait and mobility: Secondary | ICD-10-CM | POA: Diagnosis not present

## 2019-12-14 DIAGNOSIS — Z96651 Presence of right artificial knee joint: Secondary | ICD-10-CM | POA: Diagnosis not present

## 2019-12-14 DIAGNOSIS — M6281 Muscle weakness (generalized): Secondary | ICD-10-CM | POA: Diagnosis not present

## 2019-12-14 DIAGNOSIS — M25561 Pain in right knee: Secondary | ICD-10-CM | POA: Diagnosis not present

## 2019-12-14 DIAGNOSIS — M25661 Stiffness of right knee, not elsewhere classified: Secondary | ICD-10-CM | POA: Diagnosis not present

## 2019-12-18 DIAGNOSIS — M6281 Muscle weakness (generalized): Secondary | ICD-10-CM | POA: Diagnosis not present

## 2019-12-18 DIAGNOSIS — R269 Unspecified abnormalities of gait and mobility: Secondary | ICD-10-CM | POA: Diagnosis not present

## 2019-12-18 DIAGNOSIS — M25661 Stiffness of right knee, not elsewhere classified: Secondary | ICD-10-CM | POA: Diagnosis not present

## 2019-12-18 DIAGNOSIS — Z96651 Presence of right artificial knee joint: Secondary | ICD-10-CM | POA: Diagnosis not present

## 2019-12-18 DIAGNOSIS — M25561 Pain in right knee: Secondary | ICD-10-CM | POA: Diagnosis not present

## 2019-12-21 DIAGNOSIS — Z96651 Presence of right artificial knee joint: Secondary | ICD-10-CM | POA: Diagnosis not present

## 2019-12-21 DIAGNOSIS — M25661 Stiffness of right knee, not elsewhere classified: Secondary | ICD-10-CM | POA: Diagnosis not present

## 2019-12-21 DIAGNOSIS — R269 Unspecified abnormalities of gait and mobility: Secondary | ICD-10-CM | POA: Diagnosis not present

## 2019-12-21 DIAGNOSIS — M6281 Muscle weakness (generalized): Secondary | ICD-10-CM | POA: Diagnosis not present

## 2019-12-21 DIAGNOSIS — M25561 Pain in right knee: Secondary | ICD-10-CM | POA: Diagnosis not present

## 2019-12-25 DIAGNOSIS — R269 Unspecified abnormalities of gait and mobility: Secondary | ICD-10-CM | POA: Diagnosis not present

## 2019-12-25 DIAGNOSIS — M25661 Stiffness of right knee, not elsewhere classified: Secondary | ICD-10-CM | POA: Diagnosis not present

## 2019-12-25 DIAGNOSIS — Z96651 Presence of right artificial knee joint: Secondary | ICD-10-CM | POA: Diagnosis not present

## 2019-12-25 DIAGNOSIS — M6281 Muscle weakness (generalized): Secondary | ICD-10-CM | POA: Diagnosis not present

## 2019-12-25 DIAGNOSIS — M25561 Pain in right knee: Secondary | ICD-10-CM | POA: Diagnosis not present

## 2019-12-28 DIAGNOSIS — R269 Unspecified abnormalities of gait and mobility: Secondary | ICD-10-CM | POA: Diagnosis not present

## 2019-12-28 DIAGNOSIS — M25661 Stiffness of right knee, not elsewhere classified: Secondary | ICD-10-CM | POA: Diagnosis not present

## 2019-12-28 DIAGNOSIS — M25561 Pain in right knee: Secondary | ICD-10-CM | POA: Diagnosis not present

## 2019-12-28 DIAGNOSIS — Z96651 Presence of right artificial knee joint: Secondary | ICD-10-CM | POA: Diagnosis not present

## 2019-12-28 DIAGNOSIS — M6281 Muscle weakness (generalized): Secondary | ICD-10-CM | POA: Diagnosis not present

## 2020-01-02 DIAGNOSIS — R269 Unspecified abnormalities of gait and mobility: Secondary | ICD-10-CM | POA: Diagnosis not present

## 2020-01-02 DIAGNOSIS — M6281 Muscle weakness (generalized): Secondary | ICD-10-CM | POA: Diagnosis not present

## 2020-01-02 DIAGNOSIS — M25561 Pain in right knee: Secondary | ICD-10-CM | POA: Diagnosis not present

## 2020-01-02 DIAGNOSIS — Z96651 Presence of right artificial knee joint: Secondary | ICD-10-CM | POA: Diagnosis not present

## 2020-01-02 DIAGNOSIS — M25661 Stiffness of right knee, not elsewhere classified: Secondary | ICD-10-CM | POA: Diagnosis not present

## 2020-01-10 DIAGNOSIS — M6281 Muscle weakness (generalized): Secondary | ICD-10-CM | POA: Diagnosis not present

## 2020-01-10 DIAGNOSIS — M25661 Stiffness of right knee, not elsewhere classified: Secondary | ICD-10-CM | POA: Diagnosis not present

## 2020-01-10 DIAGNOSIS — R269 Unspecified abnormalities of gait and mobility: Secondary | ICD-10-CM | POA: Diagnosis not present

## 2020-01-10 DIAGNOSIS — M25561 Pain in right knee: Secondary | ICD-10-CM | POA: Diagnosis not present

## 2020-01-10 DIAGNOSIS — Z96651 Presence of right artificial knee joint: Secondary | ICD-10-CM | POA: Diagnosis not present

## 2020-01-14 DIAGNOSIS — M25561 Pain in right knee: Secondary | ICD-10-CM | POA: Diagnosis not present

## 2020-01-14 DIAGNOSIS — M25661 Stiffness of right knee, not elsewhere classified: Secondary | ICD-10-CM | POA: Diagnosis not present

## 2020-01-14 DIAGNOSIS — R269 Unspecified abnormalities of gait and mobility: Secondary | ICD-10-CM | POA: Diagnosis not present

## 2020-01-14 DIAGNOSIS — M6281 Muscle weakness (generalized): Secondary | ICD-10-CM | POA: Diagnosis not present

## 2020-01-14 DIAGNOSIS — Z96651 Presence of right artificial knee joint: Secondary | ICD-10-CM | POA: Diagnosis not present

## 2020-01-15 ENCOUNTER — Ambulatory Visit (INDEPENDENT_AMBULATORY_CARE_PROVIDER_SITE_OTHER): Payer: Medicare Other | Admitting: Allergy and Immunology

## 2020-01-15 ENCOUNTER — Other Ambulatory Visit: Payer: Self-pay

## 2020-01-15 VITALS — BP 130/82 | HR 75 | Temp 98.0°F | Resp 16

## 2020-01-15 DIAGNOSIS — K219 Gastro-esophageal reflux disease without esophagitis: Secondary | ICD-10-CM | POA: Diagnosis not present

## 2020-01-15 DIAGNOSIS — J4540 Moderate persistent asthma, uncomplicated: Secondary | ICD-10-CM | POA: Diagnosis not present

## 2020-01-15 DIAGNOSIS — J3089 Other allergic rhinitis: Secondary | ICD-10-CM

## 2020-01-15 DIAGNOSIS — G43909 Migraine, unspecified, not intractable, without status migrainosus: Secondary | ICD-10-CM | POA: Diagnosis not present

## 2020-01-15 DIAGNOSIS — L299 Pruritus, unspecified: Secondary | ICD-10-CM

## 2020-01-15 MED ORDER — PATADAY 0.7 % OP SOLN: 1.0000 [drp] | Freq: Every day | OPHTHALMIC | 1 refills | Status: DC

## 2020-01-15 MED ORDER — PATADAY 0.7 % OP SOLN: 1.0000 [drp] | Freq: Every day | OPHTHALMIC | 5 refills | Status: DC

## 2020-01-15 NOTE — Progress Notes (Signed)
[place] - [place] - [place] - [place] - [place]   Follow-up Note  Referring Provider: Jeanice Lim, Velna Hatchet, MD Primary Provider: Salley Scarlet, MD Date of Office Visit: 01/15/2020  Subjective:   Brooke Schneider (DOB: 1951-03-28) is a 69 y.o. female who [facility] in re-evaluation of the following:  HPI: Krizia returns to this clinic in evaluation of asthma and allergic rhinitis and LPR and a pruritic disorder.  Her last visit to this clinic was 20 November 2019.   Apparently she had a slight flareup of her asthma for which she used albuterol and she obtained a good result from using albuterol and now she has decided to stay on albuterol twice a day in deference to using [facility].  It does not sound as though she has required a systemic steroid to treat any type of asthma exacerbation since her last visit.  She believes that her nose is doing quite well while using some Flonase and montelukast.  She believes that her reflux is doing very well using omeprazole and famotidine.  She still continues to drink several cherry cokes per day.  She intermittently uses Atarax for an occasional pruritic disorder.  She has not developed any specific dermatitis.  When she was last seen in his clinic she also had an issue involving headaches for which she was using Maxalt on occasion.  Her headache frequency during the last evaluation was twice a week.  I asked her to decrease her cherry Coke consumption and she decreased it about 50% and is now drinking 2 cherry cokes per day.  She does think that this has helped her headache frequency and she thinks he is only having a headache once a week or so for which she will occasionally use her Maxalt.  Allergies as of 01/15/2020      Reactions   Sulfamethoxazole-trimethoprim Swelling   Metronidazole Itching, Rash      Medication List      AeroChamber Plus inhaler Use as instructed   albuterol 108 (90  Base) MCG/ACT inhaler Commonly known as: VENTOLIN HFA Inhale 2 puffs into the lungs every 6 (six) hours as needed for wheezing or shortness of breath.   buPROPion 300 MG 24 hr tablet Commonly known as: WELLBUTRIN XL TAKE 1 TABLET DAILY (DOSE CHANGE)   Calcium 600+D 600-400 MG-UNIT tablet Generic drug: Calcium Carbonate-Vitamin D Take 1 tablet by mouth 2 (two) times daily.   clotrimazole 1 % cream Commonly known as: LOTRIMIN Apply topically.   dicyclomine 20 MG tablet Commonly known as: BENTYL TAKE 1 TABLET FOUR TIMES A DAY BEFORE MEALS AND AT BEDTIME   Divigel 0.25 MG/0.25GM Gel Generic drug: Estradiol Apply 1 application topically daily. Thigh area   Dulera 200-5 MCG/ACT Aero Generic drug: mometasone-formoterol Inhale 2 puffs into the lungs 2 (two) times daily.   famotidine 40 MG tablet Commonly known as: PEPCID Take 1 tablet (40 mg total) by mouth every evening.   fluticasone 50 MCG/ACT nasal spray Commonly known as: FLONASE One spray each nostril daily as needed   HYDROcodone-acetaminophen 5-325 MG tablet Commonly known as: NORCO/VICODIN   hydrOXYzine 10 MG tablet Commonly known as: ATARAX/VISTARIL Take one tablet every 8 hours as needed   ketoconazole 2 % shampoo Commonly known as: Nizoral Apply 1 application topically 2 (two) times a week. To affected areas on skin   levothyroxine 125 MCG tablet Commonly known as: SYNTHROID Take 1 tablet (125 mcg total) by mouth daily.  montelukast 10 MG tablet Commonly known as: SINGULAIR TAKE 1 TABLET AT BEDTIME   multivitamin with minerals tablet Take 1 tablet by mouth daily.   omeprazole 40 MG capsule Commonly known as: PRILOSEC Take 1 capsule (40 mg total) by mouth in the morning.   Pazeo 0.7 % Soln Generic drug: Olopatadine HCl Place 1 drop into both eyes daily as needed.   propranolol ER 60 MG 24 hr capsule Commonly known as: INDERAL LA Take 1 capsule (60 mg total) by mouth daily.   rizatriptan 10 MG  tablet Commonly known as: MAXALT Take 1 tablet (10 mg total) by mouth as needed for migraine. Reported on 08/12/2015   topiramate 100 MG tablet Commonly known as: TOPAMAX TAKE 50 MG IN THE MORNING AND 100 MG AT BEDTIME   triamcinolone 55 MCG/ACT Aero nasal inhaler Commonly known as: NASACORT 2 sprays by Both Nostrils route daily.   zolpidem 10 MG tablet Commonly known as: AMBIEN Take 1 tablet (10 mg total) by mouth at bedtime as needed. for sleep       Past Medical History:  Diagnosis Date  . Allergic rhinitis   . Asthma   . Depression   . Goiter   . Goiter   . QJJHERDE(081.4)     Past Surgical History:  Procedure Laterality Date  . ABDOMINAL HYSTERECTOMY    . CHOLECYSTECTOMY N/A 11/02/2013   Procedure: LAPAROSCOPIC CHOLECYSTECTOMY ;  Surgeon: Cherylynn Ridges, MD;  Location: [facility] OR;  Service: General;  Laterality: N/A;  . KNEE SURGERY    . THYROIDECTOMY      Review of systems negative except as noted in HPI / PMHx or noted below:  Review of Systems  Constitutional: Negative.   HENT: Negative.   Eyes: Negative.   Respiratory: Negative.   Cardiovascular: Negative.   Gastrointestinal: Negative.   Genitourinary: Negative.   Musculoskeletal: Negative.   Skin: Negative.   Neurological: Negative.   Endo/Heme/Allergies: Negative.   Psychiatric/Behavioral: Negative.      Objective:   Vitals:   01/15/20 1605  BP: 130/82  Pulse: 75  Resp: 16  Temp: 98 F (36.7 C)  SpO2: 98%          Physical Exam Constitutional:      Appearance: She is not diaphoretic.  HENT:     Head: Normocephalic.     Right Ear: Tympanic membrane, ear canal and external ear normal.     Left Ear: Tympanic membrane, ear canal and external ear normal.     Nose: Nose normal. No mucosal edema or rhinorrhea.     Mouth/Throat:     Pharynx: Uvula midline. No oropharyngeal exudate.  Eyes:     Conjunctiva/sclera: Conjunctivae normal.  Neck:     Thyroid: No thyromegaly.     Trachea: Trachea  normal. No tracheal tenderness or tracheal deviation.  Cardiovascular:     Rate and Rhythm: Normal rate and regular rhythm.     Heart sounds: Normal heart sounds, S1 normal and S2 normal. No murmur heard.   Pulmonary:     Effort: No respiratory distress.     Breath sounds: Normal breath sounds. No stridor. No wheezing or rales.  Lymphadenopathy:     Head:     Right side of head: No tonsillar adenopathy.     Left side of head: No tonsillar adenopathy.     Cervical: No cervical adenopathy.  Skin:    Findings: No erythema or rash.     Nails: There is no clubbing.  Neurological:  Mental Status: She is alert.     Diagnostics:    Spirometry was performed and demonstrated an FEV1 of 2.02 at 104 % of predicted.  Assessment and Plan:   1. Asthma, moderate persistent, well-controlled   2. Other allergic rhinitis   3. LPRD (laryngopharyngeal reflux disease)   4. Pruritic disorder   5. Migraine syndrome     1. Continue to treat and prevent inflammation:   A. Dulera 100 - 2 inhalations 2 times per day with spacer.   B. Flonase - 1 spray each nostril 1 time per day  C. Singulair 10 mg - one tablet one time per day  2.  Continue to treat and prevent reflux:   A. omeprazole 40 mg in a.m.  B. Famotidine 40 mg in PM  3. If needed:   A. Antihistamine - Atarax 10mg  - one tablet every 8 hours   B. ProAir HFA or similar - 2 inhalations every 4-6 hours  C. Pataday - one drop each eye one time per day  4.  Return to clinic 12 weeks or earlier if problem  5. Obtain fall flu vaccine  I have encouraged Namira to consistently use her Nelva Bush rather than albuterol twice a day as she will get a much better response in the long run utilizing a combination inhaler.  She will continue on Flonase and Singulair as noted above and also continue to treat her reflux which appears to be under very good control on her current plan.  She has headaches and I have asked her to follow-up with her primary  care doctor or neurologist regarding this issue.  I will see her back in this clinic in 12 weeks or earlier if there is a problem.  Elwin Sleight, MD Allergy / Immunology [facility]

## 2020-01-15 NOTE — Patient Instructions (Addendum)
  1. Continue to treat and prevent inflammation:   A. Dulera 100 - 2 inhalations 2 times per day with spacer.   B. Flonase - 1 spray each nostril 1 time per day  C. Singulair 10 mg - one tablet one time per day  2.  Continue to treat and prevent reflux:   A. omeprazole 40 mg in a.m.  B. Famotidine 40 mg in PM  3. If needed:   A. Antihistamine - Atarax 10mg  - one tablet every 8 hours   B. ProAir HFA or similar - 2 inhalations every 4-6 hours  C. Pataday - one drop each eye one time per day  4.  Return to clinic 12 weeks or earlier if problem  5. Obtain fall flu vaccine

## 2020-01-16 ENCOUNTER — Encounter: Payer: Self-pay | Admitting: Allergy and Immunology

## 2020-01-23 DIAGNOSIS — M6281 Muscle weakness (generalized): Secondary | ICD-10-CM | POA: Diagnosis not present

## 2020-01-23 DIAGNOSIS — M25561 Pain in right knee: Secondary | ICD-10-CM | POA: Diagnosis not present

## 2020-01-23 DIAGNOSIS — M25661 Stiffness of right knee, not elsewhere classified: Secondary | ICD-10-CM | POA: Diagnosis not present

## 2020-01-23 DIAGNOSIS — Z96651 Presence of right artificial knee joint: Secondary | ICD-10-CM | POA: Diagnosis not present

## 2020-01-23 DIAGNOSIS — R269 Unspecified abnormalities of gait and mobility: Secondary | ICD-10-CM | POA: Diagnosis not present

## 2020-01-31 DIAGNOSIS — Z96651 Presence of right artificial knee joint: Secondary | ICD-10-CM | POA: Diagnosis not present

## 2020-01-31 DIAGNOSIS — R269 Unspecified abnormalities of gait and mobility: Secondary | ICD-10-CM | POA: Diagnosis not present

## 2020-01-31 DIAGNOSIS — M25561 Pain in right knee: Secondary | ICD-10-CM | POA: Diagnosis not present

## 2020-01-31 DIAGNOSIS — M25661 Stiffness of right knee, not elsewhere classified: Secondary | ICD-10-CM | POA: Diagnosis not present

## 2020-01-31 DIAGNOSIS — M6281 Muscle weakness (generalized): Secondary | ICD-10-CM | POA: Diagnosis not present

## 2020-02-05 DIAGNOSIS — R269 Unspecified abnormalities of gait and mobility: Secondary | ICD-10-CM | POA: Diagnosis not present

## 2020-02-05 DIAGNOSIS — M6281 Muscle weakness (generalized): Secondary | ICD-10-CM | POA: Diagnosis not present

## 2020-02-05 DIAGNOSIS — M25561 Pain in right knee: Secondary | ICD-10-CM | POA: Diagnosis not present

## 2020-02-05 DIAGNOSIS — Z96651 Presence of right artificial knee joint: Secondary | ICD-10-CM | POA: Diagnosis not present

## 2020-02-05 DIAGNOSIS — M25661 Stiffness of right knee, not elsewhere classified: Secondary | ICD-10-CM | POA: Diagnosis not present

## 2020-02-15 DIAGNOSIS — M25561 Pain in right knee: Secondary | ICD-10-CM | POA: Diagnosis not present

## 2020-02-15 DIAGNOSIS — M6281 Muscle weakness (generalized): Secondary | ICD-10-CM | POA: Diagnosis not present

## 2020-02-15 DIAGNOSIS — M25661 Stiffness of right knee, not elsewhere classified: Secondary | ICD-10-CM | POA: Diagnosis not present

## 2020-02-15 DIAGNOSIS — R269 Unspecified abnormalities of gait and mobility: Secondary | ICD-10-CM | POA: Diagnosis not present

## 2020-02-15 DIAGNOSIS — Z96651 Presence of right artificial knee joint: Secondary | ICD-10-CM | POA: Diagnosis not present

## 2020-02-26 ENCOUNTER — Other Ambulatory Visit: Payer: Self-pay

## 2020-02-26 MED ORDER — OMEPRAZOLE 40 MG PO CPDR: 40.0000 mg | DELAYED_RELEASE_CAPSULE | Freq: Every morning | ORAL | 1 refills | Status: DC

## 2020-02-27 ENCOUNTER — Other Ambulatory Visit: Payer: Self-pay | Admitting: *Deleted

## 2020-02-27 MED ORDER — FAMOTIDINE 40 MG PO TABS: ORAL_TABLET | ORAL | 1 refills | Status: DC

## 2020-02-27 MED ORDER — ALBUTEROL SULFATE HFA 108 (90 BASE) MCG/ACT IN AERS: INHALATION_SPRAY | RESPIRATORY_TRACT | 1 refills | Status: DC

## 2020-02-28 DIAGNOSIS — M25561 Pain in right knee: Secondary | ICD-10-CM | POA: Diagnosis not present

## 2020-02-28 DIAGNOSIS — R269 Unspecified abnormalities of gait and mobility: Secondary | ICD-10-CM | POA: Diagnosis not present

## 2020-02-28 DIAGNOSIS — M6281 Muscle weakness (generalized): Secondary | ICD-10-CM | POA: Diagnosis not present

## 2020-02-28 DIAGNOSIS — M25661 Stiffness of right knee, not elsewhere classified: Secondary | ICD-10-CM | POA: Diagnosis not present

## 2020-02-28 DIAGNOSIS — Z96651 Presence of right artificial knee joint: Secondary | ICD-10-CM | POA: Diagnosis not present

## 2020-03-01 ENCOUNTER — Other Ambulatory Visit: Payer: Self-pay | Admitting: Family Medicine

## 2020-03-10 ENCOUNTER — Other Ambulatory Visit: Payer: Self-pay | Admitting: *Deleted

## 2020-03-10 MED ORDER — FLUTICASONE PROPIONATE 50 MCG/ACT NA SUSP: NASAL | 1 refills | Status: DC

## 2020-03-13 ENCOUNTER — Other Ambulatory Visit: Payer: Self-pay | Admitting: *Deleted

## 2020-03-13 DIAGNOSIS — K580 Irritable bowel syndrome with diarrhea: Secondary | ICD-10-CM

## 2020-03-13 MED ORDER — HYDROXYZINE HCL 10 MG PO TABS: ORAL_TABLET | ORAL | 1 refills | Status: DC

## 2020-03-13 MED ORDER — DICYCLOMINE HCL 20 MG PO TABS: ORAL_TABLET | ORAL | 2 refills | Status: DC

## 2020-03-18 ENCOUNTER — Encounter: Payer: Medicare Other | Admitting: Family Medicine

## 2020-04-15 ENCOUNTER — Other Ambulatory Visit: Payer: Self-pay

## 2020-04-15 ENCOUNTER — Ambulatory Visit (INDEPENDENT_AMBULATORY_CARE_PROVIDER_SITE_OTHER): Payer: Medicare Other | Admitting: Allergy and Immunology

## 2020-04-15 VITALS — BP 122/82 | HR 83 | Temp 97.9°F | Resp 14 | Ht 64.0 in | Wt 198.2 lb

## 2020-04-15 DIAGNOSIS — K219 Gastro-esophageal reflux disease without esophagitis: Secondary | ICD-10-CM

## 2020-04-15 DIAGNOSIS — J3089 Other allergic rhinitis: Secondary | ICD-10-CM | POA: Diagnosis not present

## 2020-04-15 DIAGNOSIS — L299 Pruritus, unspecified: Secondary | ICD-10-CM | POA: Diagnosis not present

## 2020-04-15 DIAGNOSIS — J4540 Moderate persistent asthma, uncomplicated: Secondary | ICD-10-CM

## 2020-04-15 MED ORDER — MONTELUKAST SODIUM 10 MG PO TABS
10.0000 mg | ORAL_TABLET | Freq: Every day | ORAL | 3 refills | Status: DC
Start: 2020-04-15 — End: 2020-11-21

## 2020-04-15 MED ORDER — MONTELUKAST SODIUM 10 MG PO TABS
10.0000 mg | ORAL_TABLET | Freq: Every day | ORAL | 3 refills | Status: DC
Start: 2020-04-15 — End: 2020-04-15

## 2020-04-15 MED ORDER — HYDROXYZINE HCL 10 MG PO TABS
ORAL_TABLET | ORAL | 1 refills | Status: DC
Start: 2020-04-15 — End: 2021-03-20

## 2020-04-15 MED ORDER — HYDROXYZINE HCL 10 MG PO TABS: ORAL_TABLET | ORAL | 1 refills | Status: DC

## 2020-04-15 MED ORDER — PATADAY 0.7 % OP SOLN: 1.0000 [drp] | Freq: Every day | OPHTHALMIC | 1 refills | Status: DC

## 2020-04-15 MED ORDER — PATADAY 0.7 % OP SOLN: 1.0000 [drp] | Freq: Every day | OPHTHALMIC | 5 refills | Status: DC

## 2020-04-15 MED ORDER — ALBUTEROL SULFATE HFA 108 (90 BASE) MCG/ACT IN AERS
INHALATION_SPRAY | RESPIRATORY_TRACT | 2 refills | Status: DC
Start: 2020-04-15 — End: 2020-07-08

## 2020-04-15 NOTE — Patient Instructions (Addendum)
  1. Continue to treat and prevent inflammation:   A. Dulera 100 - 2 inhalations 2 times per day with spacer.   B. Flonase - 1 spray each nostril 1 time per day  C. Singulair 10 mg - one tablet one time per day  2.  Continue to treat and prevent reflux:   A. omeprazole 40 mg in a.m.  B. Famotidine 40 mg in PM  3. If needed:   A. Antihistamine - Atarax 10mg  - one tablet every 8 hours   B. ProAir HFA or similar - 2 inhalations every 4-6 hours  C. Pataday - one drop each eye one time per day  4.  Obtain blood - CBC w/d for pruritic disorder  5. Return to clinic 12 weeks or earlier if problem

## 2020-04-15 NOTE — Progress Notes (Signed)
[place] - [place] - [place] - [place] - [place]   Follow-up Note  Referring Provider: Jeanice Lim, Velna Hatchet, MD Primary Provider: Salley Scarlet, MD Date of Office Visit: 04/15/2020  Subjective:   Brooke Schneider (DOB: Nov 08, 1950) is a 69 y.o. female who [facility]2020 in re-evaluation of the following:  HPI: Latoria returns to this clinic in evaluation of asthma and allergic rhinitis and LPR and a pruritic disorder.  Her last visit to this clinic was 15 January 2020.  She has very little issues with her airway at this point in time while she intermittently uses Dulera just a few times per week and uses montelukast and Flonase commonly.  Rarely does she use a short acting bronchodilator and she has not required a systemic steroid or antibiotic for any type of airway issue since her last visit.  She believes that her reflux is under good control at this point in time on a proton pump inhibitor and H2 receptor blocker.  She has been having problems with headaches which has been a longstanding issue and we had a discussion about this issue during her last visit and she apparently is scheduled to revisit with her neurologist at some point in the future.  She still has intermittent itching episodes and uses Atarax about 4 times per week.  These episodes are not associated with any systemic or constitutional symptoms and there is no obvious provoking factor giving rise to this issue.  She has received 2 Covid vaccines and the flu vaccine this year.  Allergies as of 04/15/2020      Reactions   Sulfamethoxazole-trimethoprim Swelling   Metronidazole Itching, Rash      Medication List      AeroChamber Plus inhaler Use as instructed   albuterol 108 (90 Base) MCG/ACT inhaler Commonly known as: VENTOLIN HFA Inhale two puffs every 4-6 hours if needed for cough or wheeze   buPROPion 300 MG 24 hr tablet Commonly known as: WELLBUTRIN XL TAKE  1 TABLET DAILY (DOSE CHANGE)   Calcium 600+D 600-400 MG-UNIT tablet Generic drug: Calcium Carbonate-Vitamin D Take 1 tablet by mouth 2 (two) times daily.   clotrimazole 1 % cream Commonly known as: LOTRIMIN Apply topically.   dicyclomine 20 MG tablet Commonly known as: BENTYL TAKE 1 TABLET FOUR TIMES A DAY BEFORE MEALS AND AT BEDTIME   Divigel 0.25 MG/0.25GM Gel Generic drug: Estradiol Apply 1 application topically daily. Thigh area   Dulera 200-5 MCG/ACT Aero Generic drug: mometasone-formoterol Inhale 2 puffs into the lungs 2 (two) times daily.   famotidine 40 MG tablet Commonly known as: PEPCID Take one tablet once daily as directed   fluticasone 50 MCG/ACT nasal spray Commonly known as: FLONASE One spray each nostril daily as needed   HYDROcodone-acetaminophen 5-325 MG tablet Commonly known as: NORCO/VICODIN   hydrOXYzine 10 MG tablet Commonly known as: ATARAX/VISTARIL Take one tablet every 8 hours as needed   ketoconazole 2 % shampoo Commonly known as: Nizoral Apply 1 application topically 2 (two) times a week. To affected areas on skin   levothyroxine 125 MCG tablet Commonly known as: SYNTHROID TAKE 1 TABLET DAILY   montelukast 10 MG tablet Commonly known as: SINGULAIR TAKE 1 TABLET AT BEDTIME   multivitamin with minerals tablet Take 1 tablet by mouth daily.   omeprazole 40 MG capsule Commonly known as: PRILOSEC Take 1 capsule (40 mg total) by mouth in the morning.   Pazeo 0.7 % Soln Generic  drug: Olopatadine HCl Place 1 drop into both eyes daily as needed.   Pataday 0.7 % Soln Generic drug: Olopatadine HCl Apply 1 drop to eye daily.   Pataday 0.7 % Soln Generic drug: Olopatadine HCl Apply 1 drop to eye daily.   propranolol ER 60 MG 24 hr capsule Commonly known as: INDERAL LA Take 1 capsule (60 mg total) by mouth daily.   rizatriptan 10 MG tablet Commonly known as: MAXALT Take 1 tablet (10 mg total) by mouth as needed for migraine.  Reported on 08/12/2015   topiramate 100 MG tablet Commonly known as: TOPAMAX TAKE 50 MG IN THE MORNING AND 100 MG AT BEDTIME   triamcinolone 55 MCG/ACT Aero nasal inhaler Commonly known as: NASACORT 2 sprays by Both Nostrils route daily.   zolpidem 10 MG tablet Commonly known as: AMBIEN Take 1 tablet (10 mg total) by mouth at bedtime as needed. for sleep       Past Medical History:  Diagnosis Date  . Allergic rhinitis   . Asthma   . Depression   . Goiter   . Goiter   . IPJASNKN(397.6)     Past Surgical History:  Procedure Laterality Date  . ABDOMINAL HYSTERECTOMY    . CHOLECYSTECTOMY N/A 11/02/2013   Procedure: LAPAROSCOPIC CHOLECYSTECTOMY ;  Surgeon: Cherylynn Ridges, MD;  Location: [facility] OR;  Service: General;  Laterality: N/A;  . KNEE SURGERY    . THYROIDECTOMY      Review of systems negative except as noted in HPI / PMHx or noted below:  Review of Systems  Constitutional: Negative.   HENT: Negative.   Eyes: Negative.   Respiratory: Negative.   Cardiovascular: Negative.   Gastrointestinal: Negative.   Genitourinary: Negative.   Musculoskeletal: Negative.   Skin: Negative.   Neurological: Negative.   Endo/Heme/Allergies: Negative.   Psychiatric/Behavioral: Negative.      Objective:   Vitals:   04/15/20 1450  BP: 122/82  Pulse: 83  Resp: 14  Temp: 97.9 F (36.6 C)  SpO2: 97%   Height: 5\' 4"  (162.6 cm)  Weight: 198 lb 3.2 oz (89.9 kg)   Physical Exam Constitutional:      Appearance: She is not diaphoretic.  HENT:     Head: Normocephalic.     Right Ear: Tympanic membrane, ear canal and external ear normal.     Left Ear: Tympanic membrane, ear canal and external ear normal.     Nose: Nose normal. No mucosal edema or rhinorrhea.     Mouth/Throat:     Pharynx: Uvula midline. No oropharyngeal exudate.  Eyes:     Conjunctiva/sclera: Conjunctivae normal.  Neck:     Thyroid: No thyromegaly.     Trachea: Trachea normal. No tracheal tenderness or  tracheal deviation.  Cardiovascular:     Rate and Rhythm: Normal rate and regular rhythm.     Heart sounds: Normal heart sounds, S1 normal and S2 normal. No murmur heard.   Pulmonary:     Effort: No respiratory distress.     Breath sounds: Normal breath sounds. No stridor. No wheezing or rales.  Lymphadenopathy:     Head:     Right side of head: No tonsillar adenopathy.     Left side of head: No tonsillar adenopathy.     Cervical: No cervical adenopathy.  Skin:    Findings: No erythema or rash.     Nails: There is no clubbing.  Neurological:     Mental Status: She is alert.  Diagnostics:    Spirometry was performed and demonstrated an FEV1 of 2.0 at 106 % of predicted.  Review of blood tests obtained 30 July 2019 identifies WBC 10.7, hemoglobin 11.6, creatinine 0.79 mg/DL, ALT 15 U/L, AST 12 U/L, TSH 2.10 IU/mL, free T4 1.4 NG/DL  Assessment and Plan:   1. Asthma, moderate persistent, well-controlled   2. Other allergic rhinitis   3. LPRD (laryngopharyngeal reflux disease)   4. Pruritic disorder     1. Continue to treat and prevent inflammation:   A. Dulera 100 - 2 inhalations 2 times per day with spacer.   B. Flonase - 1 spray each nostril 1 time per day  C. Singulair 10 mg - one tablet one time per day  2.  Continue to treat and prevent reflux:   A. omeprazole 40 mg in a.m.  B. Famotidine 40 mg in PM  3. If needed:   A. Antihistamine - Atarax 10mg  - one tablet every 8 hours   B. ProAir HFA or similar - 2 inhalations every 4-6 hours  C. Pataday - one drop each eye one time per day  4.  Obtain blood - CBC w/d for pruritic disorder  5. Return to clinic 12 weeks or earlier if problem  Overall Billijo appears to be doing very well regarding her respiratory tract issue and her reflux issue on her current medical plan and she will remain on a collection of anti-inflammatory agents for airway and therapy directed against reflux.  Her pruritic disorder is still  active.  The etiology of this issue is not really clear.  Screening blood tests performed in the past have not really identified any significant abnormality.  We will check a CBC with differential to look for basophilia which may be associated with type B constitutional symptoms associated with basophilia.  Assuming she does well on this plan I will see her back in his clinic in 12 weeks or earlier if there is a problem.   Nelva Bush, MD Allergy / Immunology [facility]

## 2020-04-16 ENCOUNTER — Encounter: Payer: Self-pay | Admitting: Allergy and Immunology

## 2020-04-16 LAB — CBC WITH DIFFERENTIAL
Basophils Absolute: 0 10*3/uL (ref 0.0–0.2)
Basos: 1 %
EOS (ABSOLUTE): 0.2 10*3/uL (ref 0.0–0.4)
Eos: 4 %
Hematocrit: 42.4 % (ref 34.0–46.6)
Hemoglobin: 14.4 g/dL (ref 11.1–15.9)
Immature Grans (Abs): 0 10*3/uL (ref 0.0–0.1)
Immature Granulocytes: 0 %
Lymphocytes Absolute: 2.8 10*3/uL (ref 0.7–3.1)
Lymphs: 55 %
MCH: 30.4 pg (ref 26.6–33.0)
MCHC: 34 g/dL (ref 31.5–35.7)
MCV: 90 fL (ref 79–97)
Monocytes Absolute: 0.5 10*3/uL (ref 0.1–0.9)
Monocytes: 9 %
Neutrophils Absolute: 1.6 10*3/uL (ref 1.4–7.0)
Neutrophils: 31 %
RBC: 4.74 x10E6/uL (ref 3.77–5.28)
RDW: 13.4 % (ref 11.7–15.4)
WBC: 5.1 10*3/uL (ref 3.4–10.8)

## 2020-05-21 ENCOUNTER — Other Ambulatory Visit: Payer: Self-pay | Admitting: Family Medicine

## 2020-05-28 ENCOUNTER — Telehealth: Payer: Self-pay

## 2020-05-28 NOTE — Telephone Encounter (Signed)
Patient complaining of itching on the legs/buttocks area. There was no rash on the buttocks/lower back area.  There were few scattered hyperpigmented circular rash on the anterior lower extremities where apparently the rashes were. No visible rash today.  Skin was very dry throughout.  Advised patient to moisturize everyday - gave samples of vanicream. If rash re-occurs take pictures and let us know.

## 2020-05-28 NOTE — Telephone Encounter (Signed)
Patient came in with questions regarding Atarax prescription instructions. She had three different bottles with her. I printed the last after visit summary off so that she could have it for reference. I also let her know to call back with any other questions. Patient reported itching red bumps on her right hip. I did ask Dr. Selena Batten if she would mind taking a look. I am sending this to her for documentation. Patient does have a follow up scheduled already with her normal provider.

## 2020-06-03 ENCOUNTER — Other Ambulatory Visit: Payer: Self-pay | Admitting: Family Medicine

## 2020-06-10 DIAGNOSIS — M25561 Pain in right knee: Secondary | ICD-10-CM | POA: Diagnosis not present

## 2020-06-12 DIAGNOSIS — M25561 Pain in right knee: Secondary | ICD-10-CM | POA: Diagnosis not present

## 2020-06-13 ENCOUNTER — Ambulatory Visit: Payer: Medicare Other | Admitting: Family Medicine

## 2020-06-17 DIAGNOSIS — M25561 Pain in right knee: Secondary | ICD-10-CM | POA: Diagnosis not present

## 2020-06-27 ENCOUNTER — Encounter: Payer: Self-pay | Admitting: Family Medicine

## 2020-06-27 ENCOUNTER — Other Ambulatory Visit: Payer: Self-pay

## 2020-06-27 ENCOUNTER — Ambulatory Visit (INDEPENDENT_AMBULATORY_CARE_PROVIDER_SITE_OTHER): Payer: Medicare Other | Admitting: Family Medicine

## 2020-06-27 VITALS — BP 111/71 | HR 71 | Temp 97.2°F | Ht 64.0 in | Wt 205.4 lb

## 2020-06-27 DIAGNOSIS — J4540 Moderate persistent asthma, uncomplicated: Secondary | ICD-10-CM

## 2020-06-27 DIAGNOSIS — F5101 Primary insomnia: Secondary | ICD-10-CM

## 2020-06-27 DIAGNOSIS — K219 Gastro-esophageal reflux disease without esophagitis: Secondary | ICD-10-CM

## 2020-06-27 DIAGNOSIS — G43909 Migraine, unspecified, not intractable, without status migrainosus: Secondary | ICD-10-CM

## 2020-06-27 DIAGNOSIS — K582 Mixed irritable bowel syndrome: Secondary | ICD-10-CM | POA: Diagnosis not present

## 2020-06-27 DIAGNOSIS — T7840XS Allergy, unspecified, sequela: Secondary | ICD-10-CM

## 2020-06-27 DIAGNOSIS — E039 Hypothyroidism, unspecified: Secondary | ICD-10-CM

## 2020-06-27 DIAGNOSIS — F331 Major depressive disorder, recurrent, moderate: Secondary | ICD-10-CM

## 2020-06-27 DIAGNOSIS — R21 Rash and other nonspecific skin eruption: Secondary | ICD-10-CM

## 2020-06-27 MED ORDER — DESONIDE 0.05 % EX CREA: TOPICAL_CREAM | Freq: Two times a day (BID) | CUTANEOUS | 0 refills | Status: DC

## 2020-06-27 MED ORDER — RIZATRIPTAN BENZOATE 10 MG PO TABS: 10.0000 mg | ORAL_TABLET | ORAL | 2 refills | Status: DC | PRN

## 2020-06-27 NOTE — Assessment & Plan Note (Addendum)
Follows with allergist. On singulair 10mg  daily. Also on pataday, pepcid, and omeprazole per allergiest. Uses hydroxyzine as needed.

## 2020-06-27 NOTE — Assessment & Plan Note (Signed)
Stable on ambien 10mg  nightly as needed.

## 2020-06-27 NOTE — Assessment & Plan Note (Signed)
Stable on wellbutrin 300mg  daily.

## 2020-06-27 NOTE — Assessment & Plan Note (Signed)
On pepcid and prilosec.

## 2020-06-27 NOTE — Progress Notes (Signed)
Brooke Schneider is a 70 y.o. female who presents today for an office visit.  Assessment/Plan:  New/Acute Problems: Rash Possibly eczema.. She has been treated for fungal etiologies with no significant improvement. Will start topical desonide. Will place referral to dermatology.  Chronic Problems Addressed Today: Asthma, allergic Follows with allergy. On dulera daily and albuterol as needed. Also on singulair 10mg  daily.   Allergies Follows with allergist. On singulair 10mg  daily. Also on pataday, pepcid, and omeprazole per allergiest. Uses hydroxyzine as needed.   IBS (irritable bowel syndrome) Controlled on bentyl as needed.   Hypothyroidism On synthroid daily. Check TSH next blood draw.   GERD (gastroesophageal reflux disease) On pepcid and prilosec.   Migraine On preventative therapy with topamax 50mg  in the morning, 100mg  in the evening, propranolol 42m daily, and maxalt daily.   Insomnia Stable on ambien 10mg  nightly as needed.   MDD (major depressive disorder) Stable on wellbutrin 300mg  daily.   Preventative health care She will come back in a few months for annual physical with labs. She is up-to-date on flu and COVID-vaccine. She has mammogram done via gynecologist. She is due for colonoscopy and will follow up with her gastroenterologist regarding this.    Subjective:  HPI:  Patient is here to establish care as a new patient. She is concerned about rash that she has had ongoing for the past several months. Located on chest, back, and face. She has tried several over-the-counter products and has tried several prescription antifungal creams without significant improvement. She would like to see a dermatologist. She does not think that she has tried any topical steroid creams at this point. Has tried Lotrimin and ketoconazole.  She also requests refill of Maxalt today. See A/P for status of chronic conditions.  ROS: Per HPI, otherwise a complete review of  systems was negative.   PMH:  The following were reviewed and entered/updated in epic: Past Medical History:  Diagnosis Date  . Allergic rhinitis   . Asthma   . Depression   . Goiter   . Goiter   . )    Patient Active Problem List   Diagnosis Date Noted  . GERD (gastroesophageal reflux disease) 07/17/2019  . OA (osteoarthritis) of knee 04/13/2019  . Allergic conjunctivitis 09/13/2016  . MDD (major depressive disorder) 09/13/2016  . Tinea versicolor 07/06/2016  . IBS (irritable bowel syndrome) 01/02/2016  . Hypothyroidism 10/17/2015  . Migraine 11/27/2012  . Insomnia 07/21/2009  . Allergies 01/19/2008  . Asthma, allergic 01/19/2008   Past Surgical History:  Procedure Laterality Date  . ABDOMINAL HYSTERECTOMY    . CHOLECYSTECTOMY N/A 11/02/2013   Procedure: LAPAROSCOPIC CHOLECYSTECTOMY ;  Surgeon: 12/17/2015, MD;  Location: [facility] OR;  Service: General;  Laterality: N/A;  . KNEE SURGERY    . THYROIDECTOMY      Family History  Problem Relation Age of Onset  . Allergies Mother   . ALS Father   . Prostate cancer Father     Medications- reviewed and updated Current Outpatient Medications  Medication Sig Dispense Refill  . albuterol (VENTOLIN HFA) 108 (90 Base) MCG/ACT inhaler Inhale two puffs every 4-6 hours if needed for cough or wheeze 18 g 2  . buPROPion (WELLBUTRIN XL) 300 MG 24 hr tablet TAKE 1 TABLET DAILY (DOSE CHANGE) 90 tablet 4  . Calcium Carbonate-Vitamin D 600-400 MG-UNIT tablet Take 1 tablet by mouth 2 (two) times daily.    . clotrimazole (LOTRIMIN) 1 % cream Apply topically.    07/23/2009  desonide (DESOWEN) 0.05 % cream Apply topically 2 (two) times daily. 30 g 0  . dicyclomine (BENTYL) 20 MG tablet TAKE 1 TABLET FOUR TIMES A DAY BEFORE MEALS AND AT BEDTIME 90 tablet 2  . DIVIGEL 0.25 MG/0.25GM GEL Apply 1 application topically daily. Thigh area    . famotidine (PEPCID) 40 MG tablet Take one tablet once daily as directed 90 tablet 1  . fluticasone  (FLONASE) 50 MCG/ACT nasal spray One spray each nostril daily as needed 48 g 1  . HYDROcodone-acetaminophen (NORCO/VICODIN) 5-325 MG tablet     . hydrOXYzine (ATARAX/VISTARIL) 10 MG tablet Take one tablet every 8 hours as needed 180 tablet 1  . ketoconazole (NIZORAL) 2 % shampoo Apply 1 application topically 2 (two) times a week. To affected areas on skin 120 mL 2  . levothyroxine (SYNTHROID) 125 MCG tablet TAKE 1 TABLET DAILY 90 tablet 3  . mometasone-formoterol (DULERA) 200-5 MCG/ACT AERO Inhale 2 puffs into the lungs 2 (two) times daily. 12 g 5  . montelukast (SINGULAIR) 10 MG tablet Take 1 tablet (10 mg total) by mouth at bedtime. 90 tablet 3  . Multiple Vitamins-Minerals (MULTIVITAMIN WITH MINERALS) tablet Take 1 tablet by mouth daily.    . Olopatadine HCl (PATADAY) 0.7 % SOLN Apply 1 drop to eye daily. 7.5 mL 1  . omeprazole (PRILOSEC) 40 MG capsule Take 1 capsule (40 mg total) by mouth in the morning. 90 capsule 1  . propranolol ER (INDERAL LA) 60 MG 24 hr capsule Take 1 capsule (60 mg total) by mouth daily. 90 capsule 4  . Spacer/Aero-Holding Chambers (AEROCHAMBER PLUS) inhaler Use as instructed 1 each 2  . topiramate (TOPAMAX) 100 MG tablet TAKE 1/2 TABLET IN THE MORNING AND TAKE 1 TABLET AT BEDTIME. 135 tablet 0  . zolpidem (AMBIEN) 10 MG tablet TAKE 1 TABLET AT BEDTIME AS NEEDED FOR SLEEP. 90 tablet 0  . rizatriptan (MAXALT) 10 MG tablet Take 1 tablet (10 mg total) by mouth as needed for migraine. Reported on 08/12/2015 18 tablet 2   No current facility-administered medications for this visit.   Allergies-reviewed and updated Allergies  Allergen Reactions  . Sulfamethoxazole-Trimethoprim Swelling  . Metronidazole Itching and Rash    Social History   Socioeconomic History  . Marital status: Married    Spouse name: [name]  . Number of children: 2  . Years of education: college  . Highest education level: Not on file  Occupational History  . Occupation: housewife  Tobacco Use   . Smoking status: Passive Smoke Exposure - Never Smoker  . Smokeless tobacco: Never Used  . Tobacco comment: husband smoking cigars  Vaping Use  . Vaping Use: Never used  Substance and Sexual Activity  . Alcohol use: Yes    Comment: occ  . Drug use: No  . Sexual activity: Not Currently  Other Topics Concern  . Not on file  Social History Narrative   Patient lives at home with her husband [name]).   Retired.   Education college B.S.   Right handed.   Caffeine one cup of coffee daily.   Social Determinants of Health   Financial Resource Strain: Not on file  Food Insecurity: Not on file  Transportation Needs: Not on file  Physical Activity: Not on file  Stress: Not on file  Social Connections: Not on file         Objective:  Physical Exam: BP 111/71   Pulse 71   Temp (!) 97.2 F (36.2 C)  Ht 5\' 4"  (1.626 m)   Wt 205 lb 6.4 oz (93.2 kg)   SpO2 99%   BMI 35.26 kg/m   Gen: No acute distress, resting comfortably CV: Regular rate and rhythm with no murmurs appreciated Pulm: Normal work of breathing, clear to auscultation bilaterally with no crackles, wheezes, or rhonchi Skin: Hyperpigmented rash distributed across upper chest and left face. Neuro: Grossly normal, moves all extremities Psych: Normal affect and thought content  Time Spent: 65 minutes of total time was spent on the date of the encounter performing the following actions: chart review prior to seeing the patient including recent visits with previous PCP and allergist, obtaining history, performing a medically necessary exam, counseling on the treatment plan, placing orders, and documenting in our EHR.        . Brooke Degree, MD 06/27/2020 11:48 AM

## 2020-06-27 NOTE — Assessment & Plan Note (Signed)
Controlled on bentyl as needed.

## 2020-06-27 NOTE — Assessment & Plan Note (Signed)
On preventative therapy with topamax 50mg  in the morning, 100mg  in the evening, propranolol 80m daily, and maxalt daily.

## 2020-06-27 NOTE — Patient Instructions (Signed)
It was very nice to see you today!  Pease try the desonide cream for your rash. Apply twice daily for a few weeks let me know how it does.  I will place a referral for you to see a dermatologist.  I will refill your Maxalt today.  Please come back to see me in a few months for your annual checkup with labs. Please come back to see me sooner if needed.  Take care, Dr Jimmey Ralph  Please try these tips to maintain a healthy lifestyle:   Eat at least 3 REAL meals and 1-2 snacks per day.  Aim for no more than 5 hours between eating.  If you eat breakfast, please do so within one hour of getting up.    Each meal should contain half fruits/vegetables, one quarter protein, and one quarter carbs (no bigger than a computer mouse)   Cut down on sweet beverages. This includes juice, soda, and sweet tea.     Drink at least 1 glass of water with each meal and aim for at least 8 glasses per day   Exercise at least 150 minutes every week.

## 2020-06-27 NOTE — Assessment & Plan Note (Signed)
On synthroid daily. Check TSH next blood draw.

## 2020-06-27 NOTE — Assessment & Plan Note (Signed)
Follows with allergy. On dulera daily and albuterol as needed. Also on singulair 10mg  daily.

## 2020-07-08 ENCOUNTER — Other Ambulatory Visit: Payer: Self-pay

## 2020-07-08 ENCOUNTER — Ambulatory Visit (INDEPENDENT_AMBULATORY_CARE_PROVIDER_SITE_OTHER): Payer: Medicare Other | Admitting: Allergy and Immunology

## 2020-07-08 VITALS — BP 116/84 | HR 69 | Temp 97.6°F | Resp 16 | Ht 64.0 in | Wt 207.4 lb

## 2020-07-08 DIAGNOSIS — J3089 Other allergic rhinitis: Secondary | ICD-10-CM

## 2020-07-08 DIAGNOSIS — K219 Gastro-esophageal reflux disease without esophagitis: Secondary | ICD-10-CM

## 2020-07-08 DIAGNOSIS — J4540 Moderate persistent asthma, uncomplicated: Secondary | ICD-10-CM

## 2020-07-08 DIAGNOSIS — L299 Pruritus, unspecified: Secondary | ICD-10-CM

## 2020-07-08 MED ORDER — FAMOTIDINE 40 MG PO TABS: ORAL_TABLET | ORAL | 1 refills | Status: DC

## 2020-07-08 MED ORDER — ALBUTEROL SULFATE HFA 108 (90 BASE) MCG/ACT IN AERS: INHALATION_SPRAY | RESPIRATORY_TRACT | 2 refills | Status: DC

## 2020-07-08 MED ORDER — FLUTICASONE PROPIONATE 50 MCG/ACT NA SUSP: NASAL | 1 refills | Status: DC

## 2020-07-08 MED ORDER — DULERA 200-5 MCG/ACT IN AERO
2.0000 | INHALATION_SPRAY | Freq: Two times a day (BID) | RESPIRATORY_TRACT | 5 refills | Status: DC
Start: 2020-07-08 — End: 2020-09-26

## 2020-07-08 MED ORDER — OMEPRAZOLE 40 MG PO CPDR
40.0000 mg | DELAYED_RELEASE_CAPSULE | Freq: Every morning | ORAL | 1 refills | Status: DC
Start: 2020-07-08 — End: 2020-09-26

## 2020-07-08 NOTE — Progress Notes (Unsigned)
[place] - [place] - [place] - [place] - [place]   Follow-up Note  Referring Provider: Salley Scarlet, MD Primary Provider: Ardith Dark, MD Date of Office Visit: 07/08/2020  Subjective:   Brooke Schneider (DOB: Nov 20, 1950) is a 70 y.o. female who [facility] in re-evaluation of the following:  HPI: Marsha returns to this clinic in evaluation of asthma and allergic rhinitis and LPR and pruritus.  Her last visit to this clinic was 15 April 2020.  Her asthma is going quite well.  She rarely uses a short acting bronchodilator while she continues on Dulera twice a day.  Her nose has been doing well while using montelukast and Flonase.  She has not required a systemic steroid or an antibiotic for any type of airway issue.  She believes that her reflux is under very good control and her throat is doing well while using a proton pump inhibitor and H2 receptor blocker.  When she was last seen in this clinic she was having some issues with headache.  She has been using Maxalt very rarely just a few times per year at this point.  She still continues to have a pruritic disorder that does appear to respond to Atarax.  Sometimes she will take Benadryl and sometimes she will take Atarax.  She has received 3 Covid vaccines and a flu vaccine.  Allergies as of 07/08/2020      Reactions   Sulfamethoxazole-trimethoprim Swelling   Metronidazole Itching, Rash      Medication List      AeroChamber Plus inhaler Use as instructed   albuterol 108 (90 Base) MCG/ACT inhaler Commonly known as: VENTOLIN HFA Inhale two puffs every 4-6 hours if needed for cough or wheeze   buPROPion 300 MG 24 hr tablet Commonly known as: WELLBUTRIN XL TAKE 1 TABLET DAILY (DOSE CHANGE)   Calcium Carbonate-Vitamin D 600-400 MG-UNIT tablet Take 1 tablet by mouth 2 (two) times daily.   clotrimazole 1 % cream Commonly known as: LOTRIMIN Apply topically.    desonide 0.05 % cream Commonly known as: DesOwen Apply topically 2 (two) times daily.   dicyclomine 20 MG tablet Commonly known as: BENTYL TAKE 1 TABLET FOUR TIMES A DAY BEFORE MEALS AND AT BEDTIME   Divigel 0.25 MG/0.25GM Gel Generic drug: Estradiol Apply 1 application topically daily. Thigh area   Dulera 200-5 MCG/ACT Aero Generic drug: mometasone-formoterol Inhale 2 puffs into the lungs 2 (two) times daily.   famotidine 40 MG tablet Commonly known as: PEPCID Take one tablet once daily as directed   fluticasone 50 MCG/ACT nasal spray Commonly known as: FLONASE One spray each nostril daily as needed   HYDROcodone-acetaminophen 5-325 MG tablet Commonly known as: NORCO/VICODIN   hydrOXYzine 10 MG tablet Commonly known as: ATARAX/VISTARIL Take one tablet every 8 hours as needed   ketoconazole 2 % shampoo Commonly known as: Nizoral Apply 1 application topically 2 (two) times a week. To affected areas on skin   levothyroxine 125 MCG tablet Commonly known as: SYNTHROID TAKE 1 TABLET DAILY   montelukast 10 MG tablet Commonly known as: SINGULAIR Take 1 tablet (10 mg total) by mouth at bedtime.   multivitamin with minerals tablet Take 1 tablet by mouth daily.   omeprazole 40 MG capsule Commonly known as: PRILOSEC Take 1 capsule (40 mg total) by mouth in the morning.   Pataday 0.7 % Soln Generic drug: Olopatadine HCl Apply 1 drop to eye daily.   propranolol  ER 60 MG 24 hr capsule Commonly known as: INDERAL LA Take 1 capsule (60 mg total) by mouth daily.   rizatriptan 10 MG tablet Commonly known as: MAXALT Take 1 tablet (10 mg total) by mouth as needed for migraine. Reported on 08/12/2015   topiramate 100 MG tablet Commonly known as: TOPAMAX TAKE 1/2 TABLET IN THE MORNING AND TAKE 1 TABLET AT BEDTIME.   zolpidem 10 MG tablet Commonly known as: AMBIEN TAKE 1 TABLET AT BEDTIME AS NEEDED FOR SLEEP.       Past Medical History:  Diagnosis Date  . Allergic  rhinitis   . Asthma   . Depression   . Goiter   . Goiter   . DJSHFWYO(378.5)     Past Surgical History:  Procedure Laterality Date  . ABDOMINAL HYSTERECTOMY    . CHOLECYSTECTOMY N/A 11/02/2013   Procedure: LAPAROSCOPIC CHOLECYSTECTOMY ;  Surgeon: Cherylynn Ridges, MD;  Location: [facility] OR;  Service: General;  Laterality: N/A;  . KNEE SURGERY    . THYROIDECTOMY      Review of systems negative except as noted in HPI / PMHx or noted below:  Review of Systems  Constitutional: Negative.   HENT: Negative.   Eyes: Negative.   Respiratory: Negative.   Cardiovascular: Negative.   Gastrointestinal: Negative.   Genitourinary: Negative.   Musculoskeletal: Negative.   Skin: Negative.   Neurological: Negative.   Endo/Heme/Allergies: Negative.   Psychiatric/Behavioral: Negative.      Objective:   Vitals:   07/08/20 1745  BP: 116/84  Pulse: 69  Resp: 16  Temp: 97.6 F (36.4 C)  SpO2: 98%   Height: 5\' 4"  (162.6 cm)  Weight: 207 lb 6.4 oz (94.1 kg)   Physical Exam Constitutional:      Appearance: She is not diaphoretic.  HENT:     Head: Normocephalic.     Right Ear: Tympanic membrane, ear canal and external ear normal.     Left Ear: Tympanic membrane, ear canal and external ear normal.     Nose: Nose normal. No mucosal edema or rhinorrhea.     Mouth/Throat:     Mouth: Oropharynx is clear and moist and mucous membranes are normal.     Pharynx: Uvula midline. No oropharyngeal exudate.  Eyes:     Conjunctiva/sclera: Conjunctivae normal.  Neck:     Thyroid: No thyromegaly.     Trachea: Trachea normal. No tracheal tenderness or tracheal deviation.  Cardiovascular:     Rate and Rhythm: Normal rate and regular rhythm.     Heart sounds: Normal heart sounds, S1 normal and S2 normal. No murmur heard.   Pulmonary:     Effort: No respiratory distress.     Breath sounds: Normal breath sounds. No stridor. No wheezing or rales.  Musculoskeletal:        General: No edema.   Lymphadenopathy:     Head:     Right side of head: No tonsillar adenopathy.     Left side of head: No tonsillar adenopathy.     Cervical: No cervical adenopathy.  Skin:    Findings: No erythema or rash.     Nails: There is no clubbing.  Neurological:     Mental Status: She is alert.     Diagnostics:    Spirometry was performed and demonstrated an FEV1 of 1.27 at 68 % of predicted.  Results of blood tests obtained 15 April 2020 identified WBC 5.1, absolute eosinophil 200, absolute lymphocyte 2800, absolute basophil 0, hemoglobin 14.4.  Assessment  and Plan:   1. Asthma, moderate persistent, well-controlled   2. Other allergic rhinitis   3. LPRD (laryngopharyngeal reflux disease)   4. Pruritic disorder     1. Continue to treat and prevent inflammation:   A. Dulera 100 - 2 inhalations 2 times per day with spacer.   B. Flonase - 1 spray each nostril 1 time per day  C. Singulair 10 mg - one tablet one time per day  2.  Continue to treat and prevent reflux:   A. omeprazole 40 mg in a.m.  B. Famotidine 40 mg in PM  3. If needed:   A. Antihistamine - Atarax 10mg  - one tablet every 8 hours   B. ProAir HFA or similar - 2 inhalations every 4-6 hours  C. Pataday - one drop each eye one time per day  4.  Return to clinic 6 months or earlier if problem  [place] appears to be doing okay on her current plan of anti-inflammatory agents for her airway and therapy directed against reflux.  She will continue on this plan and she will contact me should she have problems as she moves forward.  Her pruritus appears to be handled with the use of Atarax.  Assuming she does well with this plan I will see her back in his clinic in 6 months or earlier if there is a problem.  Nelva Bush, MD Allergy / Immunology [facility]

## 2020-07-08 NOTE — Patient Instructions (Addendum)
  1. Continue to treat and prevent inflammation:   A. Dulera 100 - 2 inhalations 2 times per day with spacer.   B. Flonase - 1 spray each nostril 1 time per day  C. Singulair 10 mg - one tablet one time per day  2.  Continue to treat and prevent reflux:   A. omeprazole 40 mg in a.m.  B. Famotidine 40 mg in PM  3. If needed:   A. Antihistamine - Atarax 10mg  - one tablet every 8 hours   B. ProAir HFA or similar - 2 inhalations every 4-6 hours  C. Pataday - one drop each eye one time per day  4.  Return to clinic 6 months or earlier if problem

## 2020-07-09 ENCOUNTER — Encounter: Payer: Self-pay | Admitting: Allergy and Immunology

## 2020-07-10 DIAGNOSIS — M25561 Pain in right knee: Secondary | ICD-10-CM | POA: Diagnosis not present

## 2020-07-31 DIAGNOSIS — Z6835 Body mass index (BMI) 35.0-35.9, adult: Secondary | ICD-10-CM | POA: Diagnosis not present

## 2020-07-31 DIAGNOSIS — N952 Postmenopausal atrophic vaginitis: Secondary | ICD-10-CM | POA: Diagnosis not present

## 2020-07-31 DIAGNOSIS — N760 Acute vaginitis: Secondary | ICD-10-CM | POA: Diagnosis not present

## 2020-07-31 DIAGNOSIS — Z01419 Encounter for gynecological examination (general) (routine) without abnormal findings: Secondary | ICD-10-CM | POA: Diagnosis not present

## 2020-08-15 ENCOUNTER — Other Ambulatory Visit: Payer: Self-pay | Admitting: Family Medicine

## 2020-08-19 ENCOUNTER — Encounter: Payer: Self-pay | Admitting: *Deleted

## 2020-08-19 ENCOUNTER — Telehealth: Payer: Self-pay

## 2020-08-19 ENCOUNTER — Other Ambulatory Visit: Payer: Self-pay | Admitting: *Deleted

## 2020-09-05 ENCOUNTER — Ambulatory Visit (INDEPENDENT_AMBULATORY_CARE_PROVIDER_SITE_OTHER): Payer: Medicare Other

## 2020-09-05 ENCOUNTER — Other Ambulatory Visit: Payer: Self-pay

## 2020-09-05 ENCOUNTER — Other Ambulatory Visit: Payer: Self-pay | Admitting: Family Medicine

## 2020-09-05 VITALS — BP 117/75 | HR 90 | Temp 97.6°F | Resp 20 | Wt 207.4 lb

## 2020-09-05 DIAGNOSIS — E2839 Other primary ovarian failure: Secondary | ICD-10-CM

## 2020-09-05 DIAGNOSIS — Z0000 Encounter for general adult medical examination without abnormal findings: Secondary | ICD-10-CM

## 2020-09-05 DIAGNOSIS — Z1211 Encounter for screening for malignant neoplasm of colon: Secondary | ICD-10-CM

## 2020-09-05 DIAGNOSIS — Z638 Other specified problems related to primary support group: Secondary | ICD-10-CM

## 2020-09-05 NOTE — Progress Notes (Signed)
Subjective:   Brooke Schneider is a 70 y.o. female who presents for Medicare Annual (Subsequent) preventive examination.  Review of Systems     Cardiac Risk Factors include: advanced age (>4355men, 27>65 women);obesity (BMI >30kg/m2)     Objective:    Today's Vitals   09/05/20 1418  BP: 117/75  Pulse: 90  Resp: 20  Temp: 97.6 F (36.4 C)  SpO2: 98%  Weight: 207 lb 6.4 oz (94.1 kg)  PainSc: 8    Body mass index is 35.6 kg/m.  Advanced Directives 09/05/2020 03/13/2019 02/20/2018 01/02/2016 08/02/2015  Does Patient Have a Medical Advance Directive? Yes No No No No  Does patient want to make changes to medical advance directive? Yes (MAU/Ambulatory/Procedural Areas - Information given) - - - -  Would patient like information on creating a medical advance directive? - Yes (MAU/Ambulatory/Procedural Areas - Information given) Yes (Inpatient - patient requests chaplain consult to create a medical advance directive) No - patient declined information No - patient declined information    Current Medications (verified) Outpatient Encounter Medications as of 09/05/2020  Medication Sig  . albuterol (VENTOLIN HFA) 108 (90 Base) MCG/ACT inhaler Inhale two puffs every 4-6 hours if needed for cough or wheeze  . amoxicillin (AMOXIL) 500 MG capsule   . Calcium Carbonate-Vitamin D 600-400 MG-UNIT tablet Take 1 tablet by mouth 2 (two) times daily.  . clotrimazole (LOTRIMIN) 1 % cream Apply topically.  Marland Kitchen. desonide (DESOWEN) 0.05 % cream Apply topically 2 (two) times daily.  Marland Kitchen. dicyclomine (BENTYL) 20 MG tablet TAKE 1 TABLET FOUR TIMES A DAY BEFORE MEALS AND AT BEDTIME  . DIVIGEL 0.25 MG/0.25GM GEL Apply 1 application topically daily. Thigh area  . famotidine (PEPCID) 40 MG tablet Take one tablet once daily as directed  . fluticasone (FLONASE) 50 MCG/ACT nasal spray One spray each nostril daily as needed  . hydrOXYzine (ATARAX/VISTARIL) 10 MG tablet Take one tablet every 8 hours as needed  . levothyroxine  (SYNTHROID) 125 MCG tablet TAKE 1 TABLET DAILY  . mometasone-formoterol (DULERA) 200-5 MCG/ACT AERO Inhale 2 puffs into the lungs 2 (two) times daily.  . montelukast (SINGULAIR) 10 MG tablet Take 1 tablet (10 mg total) by mouth at bedtime.  . Multiple Vitamins-Minerals (MULTIVITAMIN WITH MINERALS) tablet Take 1 tablet by mouth daily.  . Olopatadine HCl (PATADAY) 0.7 % SOLN Apply 1 drop to eye daily.  Marland Kitchen. omeprazole (PRILOSEC) 40 MG capsule Take 1 capsule (40 mg total) by mouth in the morning.  . rizatriptan (MAXALT) 10 MG tablet Take 1 tablet (10 mg total) by mouth as needed for migraine. Reported on 08/12/2015  . Spacer/Aero-Holding Chambers (AEROCHAMBER PLUS) inhaler Use as instructed  . topiramate (TOPAMAX) 100 MG tablet TAKE 1/2 TABLET IN THE MORNING AND TAKE 1 TABLET AT BEDTIME.  Marland Kitchen. zolpidem (AMBIEN) 10 MG tablet TAKE 1 TABLET AT BEDTIME AS NEEDED FOR SLEEP.  Marland Kitchen. buPROPion (WELLBUTRIN XL) 300 MG 24 hr tablet TAKE 1 TABLET DAILY (DOSE CHANGE) (Patient not taking: Reported on 09/05/2020)  . Estradiol 10 MCG TABS vaginal tablet  (Patient not taking: Reported on 09/05/2020)  . ketoconazole (NIZORAL) 2 % shampoo Apply 1 application topically 2 (two) times a week. To affected areas on skin (Patient not taking: Reported on 09/05/2020)  . propranolol ER (INDERAL LA) 60 MG 24 hr capsule Take 1 capsule (60 mg total) by mouth daily. (Patient not taking: Reported on 09/05/2020)  . [DISCONTINUED] HYDROcodone-acetaminophen (NORCO/VICODIN) 5-325 MG tablet  (Patient not taking: Reported on 09/05/2020)  No facility-administered encounter medications on file as of 09/05/2020.    Allergies (verified) Sulfa antibiotics, Sulfamethoxazole-trimethoprim, and Metronidazole   History: Past Medical History:  Diagnosis Date  . Allergic rhinitis   . Arthritis   . Asthma   . Depression   . Goiter   . Goiter   . Headache(784.0)   . Sleep apnea    Past Surgical History:  Procedure Laterality Date  . ABDOMINAL HYSTERECTOMY     . CHOLECYSTECTOMY N/A 11/02/2013   Procedure: LAPAROSCOPIC CHOLECYSTECTOMY ;  Surgeon: Cherylynn Ridges, MD;  Location: [facility] OR;  Service: General;  Laterality: N/A;  . KNEE SURGERY    . THYROIDECTOMY     Family History  Problem Relation Age of Onset  . Allergies Mother   . ALS Father   . Prostate cancer Father    Social History   Socioeconomic History  . Marital status: Married    Spouse name: [name]  . Number of children: 2  . Years of education: college  . Highest education level: Not on file  Occupational History  . Occupation: housewife  Tobacco Use  . Smoking status: Passive Smoke Exposure - Never Smoker  . Smokeless tobacco: Never Used  . Tobacco comment: husband smoking cigars  Vaping Use  . Vaping Use: Never used  Substance and Sexual Activity  . Alcohol use: Yes    Comment: occ  . Drug use: Never  . Sexual activity: Not Currently  Other Topics Concern  . Not on file  Social History Narrative   Patient lives at home with her husband [name]).   Retired.   Education college B.S.   Right handed.   Caffeine one cup of coffee daily.   Social Determinants of Health   Financial Resource Strain: Low Risk   . Difficulty of Paying Living Expenses: Not hard at all  Food Insecurity: No Food Insecurity  . Worried About Programme researcher, broadcasting/film/video in the Last Year: Never true  . Ran Out of Food in the Last Year: Never true  Transportation Needs: No Transportation Needs  . Lack of Transportation (Medical): No  . Lack of Transportation (Non-Medical): No  Physical Activity: Inactive  . Days of Exercise per Week: 0 days  . Minutes of Exercise per Session: 0 min  Stress: Stress Concern Present  . Feeling of Stress : To some extent  Social Connections: Moderately Isolated  . Frequency of Communication with Friends and Family: More than three times a week  . Frequency of Social Gatherings with Friends and Family: Once a week  . Attends Religious Services: Never  . Active Member of  Clubs or Organizations: No  . Attends Banker Meetings: Never  . Marital Status: Married    Tobacco Counseling Counseling given: Not Answered Comment: husband smoking cigars   Clinical Intake:  Pre-visit preparation completed: Yes  Pain : 0-10 Pain Score: 8  Pain Type: Chronic pain Pain Location: Back (itching pain on back) Pain Descriptors / Indicators: Other (Comment) (itching irritation) Pain Onset: More than a month ago Pain Frequency: Constant     BMI - recorded: 35.6 Nutritional Status: BMI > 30  Obese Nutritional Risks: Nausea/ vomitting/ diarrhea (irritable bowel) Diabetes: No  How often do you need to have someone help you when you read instructions, pamphlets, or other written materials from your doctor or pharmacy?: 1 - Never  Diabetic?No  Interpreter Needed?: No  Information entered by :: Lanier Ensign, LPN   Activities of Daily Living In your  present state of health, do you have any difficulty performing the following activities: 09/05/2020 06/27/2020  Hearing? Y Y  Comment mild loss -  Vision? N N  Difficulty concentrating or making decisions? Y N  Comment memory at times -  Walking or climbing stairs? Y N  Dressing or bathing? Y N  Doing errands, shopping? N N  Preparing Food and eating ? N -  Using the Toilet? N -  In the past six months, have you accidently leaked urine? N -  Do you have problems with loss of bowel control? N -  Managing your Medications? N -  Managing your Finances? N -  Housekeeping or managing your Housekeeping? N -  Some recent data might be hidden    Patient Care Team: Ardith Dark, MD as PCP - General (Family Medicine) Nilda Riggs, NP as Nurse Practitioner (Family Medicine) Lucie Leather, Alvira Philips, MD as Consulting Physician (Allergy and Immunology) Harold Hedge, MD as Consulting Physician (Obstetrics and Gynecology)  Indicate any recent Medical Services you may have received from other than [facility]  providers in the past year (date may be approximate).     Assessment:   This is a routine wellness examination for [place].  Hearing/Vision screen  Hearing Screening             Right ear:           Left ear:           Comments: Pt states mild loss   Vision Screening Comments: Pt follows up with Dr Dione Booze bi annually for eye exams   Dietary issues and exercise activities discussed: Current Exercise Habits: The patient does not participate in regular exercise at present  Goals    . Patient Stated     Clear asthma allergy ad start back exercising       Depression Screen PHQ 2/9 Scores 09/05/2020 06/27/2020 07/17/2019 03/13/2019 02/20/2018 07/29/2017 05/20/2017  PHQ - 2 Score 0  PHQ- 9 Score - -    Fall Risk Fall Risk  09/05/2020 07/17/2019 03/13/2019 02/20/2018 07/29/2017  Falls in the past year? 0 0 0 No No  Number falls in past yr: 0 - - - -  Injury with Fall? 0 - - - -  Risk for fall due to : Impaired vision No Fall Risks Orthopedic patient - -  Follow up Falls prevention discussed Falls evaluation completed Falls evaluation completed - -    FALL RISK PREVENTION PERTAINING TO THE HOME:  Any stairs in or around the home? Yes  If so, are there any without handrails? No  Home free of loose throw rugs in walkways, pet beds, electrical cords, etc? Yes  Adequate lighting in your home to reduce risk of falls? Yes   ASSISTIVE DEVICES UTILIZED TO PREVENT FALLS:  Life alert? No  Use of a cane, walker or w/c? No  Grab bars in the bathroom? No  Shower chair or bench in shower? Yes  Elevated toilet seat or a handicapped toilet? Yes   TIMED UP AND GO:  Was the test performed? Yes .  Length of time to ambulate 10 feet: 10 sec.   Gait steady and fast without use of assistive device  Cognitive Function:     6CIT Screen 09/05/2020 02/20/2018  What Year? 0 points 0 points  What month? 0 points 0 points  What  time? - 3 points  Count back from 20 0  points 0 points  Months in reverse 0 points 0 points  Repeat phrase 2 points 0 points  Total Score - 3    Immunizations Immunization History  Administered Date(s) Administered  . Fluad Quad(high Dose 65+) 03/13/2019  . Influenza Split 02/22/2011  . Influenza Whole 04/27/2010  . Influenza, High Dose Seasonal PF 05/20/2017  . Influenza,inj,Quad PF,6+ Mos 05/16/2015, 02/10/2016  . Influenza-Unspecified 04/07/2020  . PFIZER(Purple Top)SARS-COV-2 Vaccination 08/15/2019, 09/05/2019, 07/07/2020  . Pneumococcal Conjugate-13 06/14/2016  . Pneumococcal Polysaccharide-23 06/07/2004  . Td 07/12/2006  . Tdap 07/12/2006  . Zoster 08/05/2011    TDAP status: Due, Education has been provided regarding the importance of this vaccine. Advised may receive this vaccine at local pharmacy or Health Dept. Aware to provide a copy of the vaccination record if obtained from local pharmacy or Health Dept. Verbalized acceptance and understanding.  Flu Vaccine status: Up to date  Pneumococcal vaccine status: Up to date  Covid-19 vaccine status: Completed vaccines  Qualifies for Shingles Vaccine? Yes   Zostavax completed Yes   Shingrix Completed?: No.    Education has been provided regarding the importance of this vaccine. Patient has been advised to call insurance company to determine out of pocket expense if they have not yet received this vaccine. Advised may also receive vaccine at local pharmacy or Health Dept. Verbalized acceptance and understanding.  Screening Tests Health Maintenance  Topic Date Due  . DEXA SCAN  Never done  . TETANUS/TDAP  07/12/2016  . PNA vac Low Risk Adult (2 of 2 - PPSV23) 06/14/2017  . MAMMOGRAM  07/20/2017  . COLONOSCOPY (Pts 45-39yrs Insurance coverage will need to be confirmed)  06/08/2019  . INFLUENZA VACCINE  01/05/2021  . COVID-19 Vaccine  Completed  . Hepatitis C Screening  Completed  . HPV VACCINES  Aged Out    Health  Maintenance  Health Maintenance Due  Topic Date Due  . DEXA SCAN  Never done  . TETANUS/TDAP  07/12/2016  . PNA vac Low Risk Adult (2 of 2 - PPSV23) 06/14/2017  . MAMMOGRAM  07/20/2017  . COLONOSCOPY (Pts 45-58yrs Insurance coverage will need to be confirmed)  06/08/2019    Colonoscopy screening: Order placed 09/05/20   Mammogram status: Ordered pt stated this month at [facility] . Pt provided with contact info and advised to call to schedule appt.   Bone Density status: Ordered 09/05/20. Pt provided with contact info and advised to call to schedule appt.   Additional Screening:  Hepatitis C Screening:  Completed 02/20/18  Vision Screening: Recommended annual ophthalmology exams for early detection of glaucoma and other disorders of the eye. Is the patient up to date with their annual eye exam?  No  Who is the provider or what is the name of the office in which the patient attends annual eye exams? Dr Dione Booze  If pt is not established with a provider, would they like to be referred to a provider to establish care? No .   Dental Screening: Recommended annual dental exams for proper oral hygiene  Community Resource Referral / Chronic Care Management: CRR required this visit?  Yes   CCM required this visit?  Yes      Plan:     I have personally reviewed and noted the following in the patient's chart:   . Medical and social history . Use of alcohol, tobacco or illicit drugs  . Current medications and supplements . Functional ability and status . Nutritional status . Physical activity .  Advanced directives . List of other physicians . Hospitalizations, surgeries, and ER visits in previous 12 months . Vitals . Screenings to include cognitive, depression, and falls . Referrals and appointments  In addition, I have reviewed and discussed with patient certain preventive protocols, quality metrics, and best practice recommendations. A written personalized care plan for  preventive services as well as general preventive health recommendations were provided to patient.     Marzella Schlein, LPN   02/13/2425   Nurse Notes: None

## 2020-09-05 NOTE — Addendum Note (Signed)
Addended by: Marzella Schlein on: 09/05/2020 03:08 PM   Modules accepted: Orders

## 2020-09-05 NOTE — Patient Instructions (Addendum)
Brooke Schneider , Thank you for taking time to come for your Medicare Wellness Visit. I appreciate your ongoing commitment to your health goals. Please review the following plan we discussed and let me know if I can assist you in the future.   Screening recommendations/referrals: Colonoscopy: Order placed 09/05/20 Mammogram: Done 07/21/15 Bone Density: Order Placed 09/05/20 Recommended yearly ophthalmology/optometry visit for glaucoma screening and checkup Recommended yearly dental visit for hygiene and checkup  Vaccinations: Influenza vaccine: Up to date Pneumococcal vaccine: Up to date Tdap vaccine: Due and discussed Shingles vaccine: Shingrix discussed. Please contact your pharmacy for coverage information.     Covid-19:Completed 3/10, 3/31, & 07/07/20  Advanced directives: Advance directive discussed with you today. I have provided a copy for you to complete at home and have notarized. Once this is complete please bring a copy in to our office so we can scan it into your chart.   Conditions/risks identified: Get back to exercise and get rid of asthma issues  Next appointment: Follow up in one year for your annual wellness visit    Preventive Care 65 Years and Older, Female Preventive care refers to lifestyle choices and visits with your health care provider that can promote health and wellness. What does preventive care include?  A yearly physical exam. This is also called an annual well check.  Dental exams once or twice a year.  Routine eye exams. Ask your health care provider how often you should have your eyes checked.  Personal lifestyle choices, including:  Daily care of your teeth and gums.  Regular physical activity.  Eating a healthy diet.  Avoiding tobacco and drug use.  Limiting alcohol use.  Practicing safe sex.  Taking low-dose aspirin every day.  Taking vitamin and mineral supplements as recommended by your health care provider. What happens during an  annual well check? The services and screenings done by your health care provider during your annual well check will depend on your age, overall health, lifestyle risk factors, and family history of disease. Counseling  Your health care provider may ask you questions about your:  Alcohol use.  Tobacco use.  Drug use.  Emotional well-being.  Home and relationship well-being.  Sexual activity.  Eating habits.  History of falls.  Memory and ability to understand (cognition).  Work and work Astronomer.  Reproductive health. Screening  You may have the following tests or measurements:  Height, weight, and BMI.  Blood pressure.  Lipid and cholesterol levels. These may be checked every 5 years, or more frequently if you are over 8 years old.  Skin check.  Lung cancer screening. You may have this screening every year starting at age 81 if you have a 30-pack-year history of smoking and currently smoke or have quit within the past 15 years.  Fecal occult blood test (FOBT) of the stool. You may have this test every year starting at age 59.  Flexible sigmoidoscopy or colonoscopy. You may have a sigmoidoscopy every 5 years or a colonoscopy every 10 years starting at age 98.  Hepatitis C blood test.  Hepatitis B blood test.  Sexually transmitted disease (STD) testing.  Diabetes screening. This is done by checking your blood sugar (glucose) after you have not eaten for a while (fasting). You may have this done every 1-3 years.  Bone density scan. This is done to screen for osteoporosis. You may have this done starting at age 42.  Mammogram. This may be done every 1-2 years. Talk to your health  care provider about how often you should have regular mammograms. Talk with your health care provider about your test results, treatment options, and if necessary, the need for more tests. Vaccines  Your health care provider may recommend certain vaccines, such as:  Influenza  vaccine. This is recommended every year.  Tetanus, diphtheria, and acellular pertussis (Tdap, Td) vaccine. You may need a Td booster every 10 years.  Zoster vaccine. You may need this after age 60.  Pneumococcal 13-valent conjugate (PCV13) vaccine. One dose is recommended after age 61.  Pneumococcal polysaccharide (PPSV23) vaccine. One dose is recommended after age 15. Talk to your health care provider about which screenings and vaccines you need and how often you need them. This information is not intended to replace advice given to you by your health care provider. Make sure you discuss any questions you have with your health care provider. Document Released: 06/20/2015 Document Revised: 02/11/2016 Document Reviewed: 03/25/2015 Elsevier Interactive Patient Education  2017 [place] Prevention in the Home Falls can cause injuries. They can happen to people of all ages. There are many things you can do to make your home safe and to help prevent falls. What can I do on the outside of my home?  Regularly fix the edges of walkways and driveways and fix any cracks.  Remove anything that might make you trip as you walk through a door, such as a raised step or threshold.  Trim any bushes or trees on the path to your home.  Use bright outdoor lighting.  Clear any walking paths of anything that might make someone trip, such as rocks or tools.  Regularly check to see if handrails are loose or broken. Make sure that both sides of any steps have handrails.  Any raised decks and porches should have guardrails on the edges.  Have any leaves, snow, or ice cleared regularly.  Use sand or salt on walking paths during winter.  Clean up any spills in your garage right away. This includes oil or grease spills. What can I do in the bathroom?  Use night lights.  Install grab bars by the toilet and in the tub and shower. Do not use towel bars as grab bars.  Use non-skid mats or decals  in the tub or shower.  If you need to sit down in the shower, use a plastic, non-slip stool.  Keep the floor dry. Clean up any water that spills on the floor as soon as it happens.  Remove soap buildup in the tub or shower regularly.  Attach bath mats securely with double-sided non-slip rug tape.  Do not have throw rugs and other things on the floor that can make you trip. What can I do in the bedroom?  Use night lights.  Make sure that you have a light by your bed that is easy to reach.  Do not use any sheets or blankets that are too big for your bed. They should not hang down onto the floor.  Have a firm chair that has side arms. You can use this for support while you get dressed.  Do not have throw rugs and other things on the floor that can make you trip. What can I do in the kitchen?  Clean up any spills right away.  Avoid walking on wet floors.  Keep items that you use a lot in easy-to-reach places.  If you need to reach something above you, use a strong step stool that has a grab  bar.  Keep electrical cords out of the way.  Do not use floor polish or wax that makes floors slippery. If you must use wax, use non-skid floor wax.  Do not have throw rugs and other things on the floor that can make you trip. What can I do with my stairs?  Do not leave any items on the stairs.  Make sure that there are handrails on both sides of the stairs and use them. Fix handrails that are broken or loose. Make sure that handrails are as long as the stairways.  Check any carpeting to make sure that it is firmly attached to the stairs. Fix any carpet that is loose or worn.  Avoid having throw rugs at the top or bottom of the stairs. If you do have throw rugs, attach them to the floor with carpet tape.  Make sure that you have a light switch at the top of the stairs and the bottom of the stairs. If you do not have them, ask someone to add them for you. What else can I do to help  prevent falls?  Wear shoes that:  Do not have high heels.  Have rubber bottoms.  Are comfortable and fit you well.  Are closed at the toe. Do not wear sandals.  If you use a stepladder:  Make sure that it is fully opened. Do not climb a closed stepladder.  Make sure that both sides of the stepladder are locked into place.  Ask someone to hold it for you, if possible.  Clearly mark and make sure that you can see:  Any grab bars or handrails.  First and last steps.  Where the edge of each step is.  Use tools that help you move around (mobility aids) if they are needed. These include:  Canes.  Walkers.  Scooters.  Crutches.  Turn on the lights when you go into a dark area. Replace any light bulbs as soon as they burn out.  Set up your furniture so you have a clear path. Avoid moving your furniture around.  If any of your floors are uneven, fix them.  If there are any pets around you, be aware of where they are.  Review your medicines with your doctor. Some medicines can make you feel dizzy. This can increase your chance of falling. Ask your doctor what other things that you can do to help prevent falls. This information is not intended to replace advice given to you by your health care provider. Make sure you discuss any questions you have with your health care provider. Document Released: 03/20/2009 Document Revised: 10/30/2015 Document Reviewed: 06/28/2014 Elsevier Interactive Patient Education  2017 [employer].

## 2020-09-09 ENCOUNTER — Telehealth: Payer: Self-pay | Admitting: *Deleted

## 2020-09-09 NOTE — Chronic Care Management (AMB) (Signed)
  Chronic Care Management   Outreach Note  09/09/2020 Name: Brooke Schneider MRN: [id number] DOB: April 02, 1951  Brooke Schneider is a 70 y.o. year old female who is a primary care patient of Ardith Dark, MD. I reached out to Brooke Schneider by phone today in response to a referral sent by [name]'s PCP, Dr. Jimmey Ralph.     An unsuccessful telephone outreach was attempted today. The patient was referred to the case management team for assistance with care management and care coordination.   Follow Up Plan: A HIPAA compliant phone message was left for the patient providing contact information and requesting a return call. The care management team will reach out to the patient again over the next 3 days. If patient returns call to provider office, please advise to call Embedded Care Management Care Guide Gwenevere Ghazi at [phone].  Gwenevere Ghazi  Care Guide, Embedded Care Coordination [facility]

## 2020-09-11 NOTE — Chronic Care Management (AMB) (Signed)
  Chronic Care Management   Outreach Note  09/11/2020 Name: Brooke Schneider MRN: [id number] DOB: 1951-04-01  Brooke Schneider is a 70 y.o. year old female who is a primary care patient of Ardith Dark, MD. I reached out to [name] by phone today in response to a referral sent by Ms. [name]'s PCP, Dr. Jimmey Ralph.      A second unsuccessful telephone outreach was attempted today. The patient was referred to the case management team for assistance with care management and care coordination.   Follow Up Plan: A HIPAA compliant phone message was left for the patient providing contact information and requesting a return call. The care management team will reach out to the patient again over the next 7 days. If patient returns call to provider office, please advise to call Embedded Care Management Care Guide Gwenevere Ghazi at [phone].  Gwenevere Ghazi  Care Guide, Embedded Care Coordination [facility] Management

## 2020-09-12 NOTE — Chronic Care Management (AMB) (Signed)
  Chronic Care Management   Note  09/12/2020 Name: [name] MRN: [id number] DOB: Nov 25, 1950  Brooke Schneider is a 70 y.o. year old female who is a primary care patient of Vivi Barrack, MD. I reached out to Oletta Lamas by phone today in response to a referral sent by Ms. Monmouth A Nodarse's PCP, Dr. Jerline Pain.     [name] was given information about Chronic Care Management services today including:  1. CCM service includes personalized support from designated clinical staff supervised by her physician, including individualized plan of care and coordination with other care providers 2. 24/7 contact phone numbers for assistance for urgent and routine care needs. 3. Service will only be billed when office clinical staff spend 20 minutes or more in a month to coordinate care. 4. Only one practitioner may furnish and bill the service in a calendar month. 5. The patient may stop CCM services at any time (effective at the end of the month) by phone call to the office staff. 6. The patient will be responsible for cost sharing (co-pay) of up to 20% of the service fee (after annual deductible is met).  Patient agreed to services and verbal consent obtained.   Follow up plan: Telephone appointment with care management team member scheduled for:09/29/2020  Tyrelle Raczka  Care Guide, Embedded Care Coordination [place]  Care Management

## 2020-09-26 ENCOUNTER — Other Ambulatory Visit: Payer: Self-pay

## 2020-09-26 ENCOUNTER — Encounter: Payer: Self-pay | Admitting: Family Medicine

## 2020-09-26 ENCOUNTER — Ambulatory Visit (INDEPENDENT_AMBULATORY_CARE_PROVIDER_SITE_OTHER): Payer: Medicare Other | Admitting: Family Medicine

## 2020-09-26 VITALS — BP 132/86 | HR 74 | Temp 97.6°F | Wt 210.8 lb

## 2020-09-26 DIAGNOSIS — M1711 Unilateral primary osteoarthritis, right knee: Secondary | ICD-10-CM

## 2020-09-26 DIAGNOSIS — G43909 Migraine, unspecified, not intractable, without status migrainosus: Secondary | ICD-10-CM

## 2020-09-26 DIAGNOSIS — J4540 Moderate persistent asthma, uncomplicated: Secondary | ICD-10-CM | POA: Diagnosis not present

## 2020-09-26 DIAGNOSIS — K582 Mixed irritable bowel syndrome: Secondary | ICD-10-CM

## 2020-09-26 DIAGNOSIS — Z1211 Encounter for screening for malignant neoplasm of colon: Secondary | ICD-10-CM | POA: Diagnosis not present

## 2020-09-26 DIAGNOSIS — K219 Gastro-esophageal reflux disease without esophagitis: Secondary | ICD-10-CM

## 2020-09-26 DIAGNOSIS — F331 Major depressive disorder, recurrent, moderate: Secondary | ICD-10-CM

## 2020-09-26 DIAGNOSIS — K580 Irritable bowel syndrome with diarrhea: Secondary | ICD-10-CM

## 2020-09-26 DIAGNOSIS — T7840XS Allergy, unspecified, sequela: Secondary | ICD-10-CM

## 2020-09-26 DIAGNOSIS — E039 Hypothyroidism, unspecified: Secondary | ICD-10-CM | POA: Diagnosis not present

## 2020-09-26 MED ORDER — FLUTICASONE-SALMETEROL 500-50 MCG/DOSE IN AEPB: 1.0000 | INHALATION_SPRAY | Freq: Two times a day (BID) | RESPIRATORY_TRACT | 5 refills | Status: DC

## 2020-09-26 MED ORDER — PANTOPRAZOLE SODIUM 40 MG PO TBEC: 40.0000 mg | DELAYED_RELEASE_TABLET | Freq: Every day | ORAL | 3 refills | Status: DC

## 2020-09-26 MED ORDER — DIVIGEL 0.25 MG/0.25GM TD GEL: 1.0000 "application " | Freq: Every day | TRANSDERMAL | 5 refills | Status: DC

## 2020-09-26 MED ORDER — DICYCLOMINE HCL 20 MG PO TABS
ORAL_TABLET | ORAL | 2 refills | Status: DC
Start: 2020-09-26 — End: 2021-11-09

## 2020-09-26 NOTE — Patient Instructions (Signed)
It was very nice to see you today!  Please stop the [facility].  Start Advair.  Stop the Prilosec.  Start Protonix.  I will refill your other medications today.  I will refer you to have your colonoscopy done.  Please follow-up with your orthopedist.  I will see back in 3 to 6 months.  Come back to see me sooner if needed.  Take care, Dr Jimmey Ralph  PLEASE NOTE:  If you had any lab tests please let us know if you have not heard back within a few days. You may see your results on mychart before we have a chance to review them but we will give you a call once they are reviewed by [place]. If we ordered any referrals today, please let us know if you have not heard from their office within the next week.   Please try these tips to maintain a healthy lifestyle:   Eat at least 3 REAL meals and 1-2 snacks per day.  Aim for no more than 5 hours between eating.  If you eat breakfast, please do so within one hour of getting up.    Each meal should contain half fruits/vegetables, one quarter protein, and one quarter carbs (no bigger than a computer mouse)   Cut down on sweet beverages. This includes juice, soda, and sweet tea.     Drink at least 1 glass of water with each meal and aim for at least 8 glasses per day   Exercise at least 150 minutes every week.

## 2020-09-26 NOTE — Assessment & Plan Note (Signed)
Need to check TSH soon.  She declined blood draw today.  Continue Synthroid 125 mcg daily check TSH next blood draw.

## 2020-09-26 NOTE — Progress Notes (Signed)
   Brooke Schneider is a 70 y.o. female who presents today for an office visit.  Assessment/Plan:  Chronic Problems Addressed Today: IBS (irritable bowel syndrome) Stable.  Will refill Bentyl today.  Symptoms are well controlled on this.  Allergies She does not feel like the dulera  is helping.  She has been on Advair in the past and has done well with this.  She would like to change back.  We will send in prescription for Advair.  Advised her to stop Dulera.  Continue Singulair 10 mg daily.  Advised her to follow-up with allergy soon.  Hypothyroidism Need to check TSH soon.  She declined blood draw today.  Continue Synthroid 125 mcg daily check TSH next blood draw.  Migraine She stopped ropinirole.  Has been doing well.  We will continue Topamax 50 mg in the morning, 100 mg in evening.  Continue Maxalt as needed.  GERD (gastroesophageal reflux disease) She does not feel like Prilosec is strong enough.  We will switch to Protonix.  Continue Pepcid as needed.  MDD (major depressive disorder) Stable off Wellbutrin.  She will be following up with therapist soon.  Preventative Healthcare She will be following up with GYN soon for mammogram and pelvic exam.  We will refer to GI for colonoscopy.  She will follow up in 3-6 months due to above medication chnages.     Subjective:  HPI:  See A/p         Objective:  Physical Exam: BP 132/86   Pulse 74   Temp 97.6 F (36.4 C) (Temporal)   Wt 210 lb 12.8 oz (95.6 kg)   SpO2 99%   BMI 36.18 kg/m   Gen: No acute distress, resting comfortably CV: Regular rate and rhythm with no murmurs appreciated Pulm: Normal work of breathing, clear to auscultation bilaterally with no crackles, wheezes, or rhonchi Neuro: Grossly normal, moves all extremities Psych: Normal affect and thought content  Time Spent: 48 minutes of total time was spent on the date of the encounter performing the following actions: chart review prior to seeing the  patient including recent visits with specialist, obtaining history, performing a medically necessary exam, counseling on the treatment plan, placing orders, and documenting in our EHR.       Katina Degree. Jimmey Ralph, MD 09/26/2020 2:28 PM

## 2020-09-26 NOTE — Assessment & Plan Note (Signed)
She stopped ropinirole.  Has been doing well.  We will continue Topamax 50 mg in the morning, 100 mg in evening.  Continue Maxalt as needed.

## 2020-09-26 NOTE — Assessment & Plan Note (Signed)
She does not feel like the dulera  is helping.  She has been on Advair in the past and has done well with this.  She would like to change back.  We will send in prescription for Advair.  Advised her to stop Dulera.  Continue Singulair 10 mg daily.  Advised her to follow-up with allergy soon.

## 2020-09-26 NOTE — Assessment & Plan Note (Signed)
Stable off Wellbutrin.  She will be following up with therapist soon.

## 2020-09-26 NOTE — Assessment & Plan Note (Signed)
She does not feel like Prilosec is strong enough.  We will switch to Protonix.  Continue Pepcid as needed.

## 2020-09-26 NOTE — Assessment & Plan Note (Signed)
Stable.  Will refill Bentyl today.  Symptoms are well controlled on this.

## 2020-09-26 NOTE — Assessment & Plan Note (Signed)
Pain still not controlled.  Advised her to follow-up with orthopedics soon.

## 2020-09-29 ENCOUNTER — Telehealth: Payer: Self-pay | Admitting: *Deleted

## 2020-09-29 ENCOUNTER — Telehealth: Payer: Medicare Other

## 2020-09-29 NOTE — Telephone Encounter (Signed)
  Chronic Care Management   Outreach Note  09/29/2020 Name: Brooke Schneider MRN: [id number] DOB: [birth year]  Referred by: Ardith Dark, MD Reason for referral : Chronic Care Management (Unsuccessful Initial Outreach Call for Reduction and Management of Symptoms of Major Depressive Disorder.)  The patient was referred to the case management team for assistance with care management and care coordination. LCSW made an initial attempt to try and contact patient today, without success. A HIPAA compliant message was left for patient on voicemail on her cell phone, as instructed.  LCSW is currently awaiting a return call.  LCSW will make a second outreach attempt again next week, if a return call is not received from patient in the meantime. The patient has been provided with contact information for the care management team and has been advised to call with any health related questions or concerns.   Follow-Up:  10/06/2020 at 9:15am.  Danford Bad LCSW Licensed Clinical Social Worker [facility] [phone]

## 2020-10-06 ENCOUNTER — Telehealth: Payer: Medicare Other

## 2020-10-06 ENCOUNTER — Telehealth: Payer: Self-pay | Admitting: *Deleted

## 2020-10-06 NOTE — Telephone Encounter (Signed)
  Chronic Care Management   Outreach Note  10/06/2020 Name: ZAINEB NOWACZYK MRN: [id number] DOB: 03-19-1951  Referred by: Ardith Dark, MD Reason for referral : Chronic Care Management for Patient with Major Depressive Disorder and Marital Discord. Second Unsuccessful Initial Outreach Call Attempt.  The patient was referred to the case management team for assistance with care management and care coordination.  A second unsuccessful telephone outreach was attempted today. LCSW has not been successful in getting in contact with patient on her cell phone, but has been strictly advised not to contact patient on her home phone number.  A HIPAA compliant message was left on patient's cell phone voicemail, and LCSW is currently awaiting a return call.  LCSW will make a third outreach attempt again next week, if a return call is not received from the patient in the meantime. Follow Up Plan: Telephone follow up appointment with care management team member scheduled for:  10/14/2020 at 10:30am.  Danford Bad LCSW Licensed Clinical Social Worker [facility] [place]  [phone]

## 2020-10-14 ENCOUNTER — Telehealth: Payer: Medicare Other | Admitting: *Deleted

## 2020-10-14 ENCOUNTER — Telehealth: Payer: Self-pay | Admitting: *Deleted

## 2020-10-14 NOTE — Telephone Encounter (Cosign Needed)
  Chronic Care Management   Outreach Note  10/14/2020 Name: Brooke Schneider MRN: [id number] DOB: 1951-05-22  Referred by: Ardith Dark, MD Reason for referral : Chronic Care Management for Patient with Major Depressive Disorder and Marital Discord. Third Unsuccessful Initial Outreach Call Attempt.   The patient was referred to the case management team for assistance with care management and care coordination.  Third unsuccessful telephone outreach was attempted today.  A HIPAA compliant message was left on voicemail for patient on her cell phone, as instructed, and LCSW continues to await a return call.  The patient's primary care provider has been notified of our unsuccessful attempts to make initial contact with the patient. The care management team is pleased to engage with this patient at any time in the future should she be interested in assistance from the care management team.   Follow Up Plan: The patient has been provided with contact information for the care management team and has been advised to call with any health related questions or concerns.  If patient returns call to provider office, please advise to call LCSW at # [phone], if interested in receiving social work services and resources.  Brooke Celeste Haruto Demaria LCSW Licensed Clinical Social Worker [facility] [phone]

## 2020-11-14 NOTE — Telephone Encounter (Signed)
Error

## 2020-11-17 ENCOUNTER — Ambulatory Visit: Payer: Medicare Other | Admitting: Dermatology

## 2020-11-20 ENCOUNTER — Other Ambulatory Visit: Payer: Self-pay | Admitting: Family Medicine

## 2020-11-29 ENCOUNTER — Other Ambulatory Visit: Payer: Self-pay | Admitting: Family Medicine

## 2020-12-09 DIAGNOSIS — M62559 Muscle wasting and atrophy, not elsewhere classified, unspecified thigh: Secondary | ICD-10-CM | POA: Diagnosis not present

## 2020-12-09 DIAGNOSIS — M25561 Pain in right knee: Secondary | ICD-10-CM | POA: Diagnosis not present

## 2021-01-14 ENCOUNTER — Telehealth: Payer: Self-pay

## 2021-01-14 NOTE — Telephone Encounter (Signed)
  Encourage patient to contact the pharmacy for refills or they can request refills through [facility]  LAST APPOINTMENT DATE: 09/26/2020  NEXT APPOINTMENT DATE: 03/03/2021   MEDICATION: Fluticasone-Salmeterol (ADVAIR DISKUS) 500-50 MCG/DOSE AEPB//pantoprazole (PROTONIX) 40 MG tablet   PHARMACY: [facility] [place], [place] - [address]  Let patient know to contact pharmacy at the end of the day to make sure medication is ready.  Please notify patient to allow 48-72 hours to process

## 2021-01-15 MED ORDER — FLUTICASONE-SALMETEROL 100-50 MCG/ACT IN AEPB: 1.0000 | INHALATION_SPRAY | Freq: Two times a day (BID) | RESPIRATORY_TRACT | 3 refills | Status: DC

## 2021-01-15 MED ORDER — PANTOPRAZOLE SODIUM 40 MG PO TBEC: 40.0000 mg | DELAYED_RELEASE_TABLET | Freq: Every day | ORAL | 3 refills | Status: DC

## 2021-01-15 NOTE — Telephone Encounter (Signed)
Refills sent to pharmacy. 

## 2021-01-20 ENCOUNTER — Ambulatory Visit: Payer: Medicare Other | Admitting: Allergy & Immunology

## 2021-01-20 DIAGNOSIS — M62559 Muscle wasting and atrophy, not elsewhere classified, unspecified thigh: Secondary | ICD-10-CM | POA: Diagnosis not present

## 2021-01-22 DIAGNOSIS — M62559 Muscle wasting and atrophy, not elsewhere classified, unspecified thigh: Secondary | ICD-10-CM | POA: Diagnosis not present

## 2021-01-26 DIAGNOSIS — M62559 Muscle wasting and atrophy, not elsewhere classified, unspecified thigh: Secondary | ICD-10-CM | POA: Diagnosis not present

## 2021-01-28 DIAGNOSIS — M62559 Muscle wasting and atrophy, not elsewhere classified, unspecified thigh: Secondary | ICD-10-CM | POA: Diagnosis not present

## 2021-02-02 DIAGNOSIS — M62559 Muscle wasting and atrophy, not elsewhere classified, unspecified thigh: Secondary | ICD-10-CM | POA: Diagnosis not present

## 2021-02-03 ENCOUNTER — Encounter: Payer: Self-pay | Admitting: Gastroenterology

## 2021-02-04 DIAGNOSIS — M62559 Muscle wasting and atrophy, not elsewhere classified, unspecified thigh: Secondary | ICD-10-CM | POA: Diagnosis not present

## 2021-02-05 DIAGNOSIS — N819 Female genital prolapse, unspecified: Secondary | ICD-10-CM | POA: Diagnosis not present

## 2021-02-10 DIAGNOSIS — M62559 Muscle wasting and atrophy, not elsewhere classified, unspecified thigh: Secondary | ICD-10-CM | POA: Diagnosis not present

## 2021-02-12 DIAGNOSIS — M62559 Muscle wasting and atrophy, not elsewhere classified, unspecified thigh: Secondary | ICD-10-CM | POA: Diagnosis not present

## 2021-02-18 DIAGNOSIS — M62559 Muscle wasting and atrophy, not elsewhere classified, unspecified thigh: Secondary | ICD-10-CM | POA: Diagnosis not present

## 2021-02-19 ENCOUNTER — Ambulatory Visit (INDEPENDENT_AMBULATORY_CARE_PROVIDER_SITE_OTHER): Payer: Medicare Other | Admitting: Gastroenterology

## 2021-02-19 ENCOUNTER — Encounter: Payer: Self-pay | Admitting: Gastroenterology

## 2021-02-19 VITALS — BP 150/80 | HR 95 | Ht 64.0 in | Wt 214.0 lb

## 2021-02-19 DIAGNOSIS — Z1212 Encounter for screening for malignant neoplasm of rectum: Secondary | ICD-10-CM

## 2021-02-19 DIAGNOSIS — K219 Gastro-esophageal reflux disease without esophagitis: Secondary | ICD-10-CM | POA: Diagnosis not present

## 2021-02-19 DIAGNOSIS — K5900 Constipation, unspecified: Secondary | ICD-10-CM | POA: Diagnosis not present

## 2021-02-19 DIAGNOSIS — Z1211 Encounter for screening for malignant neoplasm of colon: Secondary | ICD-10-CM

## 2021-02-19 MED ORDER — SUTAB 1479-225-188 MG PO TABS: 1.0000 | ORAL_TABLET | Freq: Once | ORAL | 0 refills | Status: AC

## 2021-02-19 NOTE — Progress Notes (Signed)
HPI : Brooke Schneider is a very pleasant 70 year old female with a history of asthma, depression and sleep apnea who is referred by Dr. Jacquiline Doe for colon cancer screening and to evaluate abdominal pain.  The patient's last colonoscopy was in 2011 and was notable for a submucosal bleb in the left colon and an isolated diverticulum, but was otherwise normal.  She had a colonoscopy in 2005 in which 2 mucosal blebs were seen and a single small polyp in the sigmoid colon was removed (histology not available). Patient reports a history of irregular bowel habits, manifested mostly by chronic constipation, but with episodes of diarrhea as well.  Recently, the diarrhea seems to be less frequent, happening maybe 3 times a year.  On average, she has a bowel movement about every other day, sometimes associated with straining.  She takes senna as needed, usually once a week for her constipation.  She has abdominal discomfort, which is usually associated with worsening constipation. She also has chronic heartburn and burning epigastric pain.  The symptoms have been present for at least 10 years and seem to have worsened in the past few years.  She is currently taking Pepcid and Protonix.  She denies acid regurgitation or dysphagia.    Past Medical History:  Diagnosis Date   Allergic rhinitis    Arthritis    Asthma    Depression    Goiter    Goiter    Headache(784.0)    Sleep apnea      Past Surgical History:  Procedure Laterality Date   ABDOMINAL HYSTERECTOMY     CHOLECYSTECTOMY N/A 11/02/2013   Procedure: LAPAROSCOPIC CHOLECYSTECTOMY ;  Surgeon: Cherylynn Ridges, MD;  Location: [facility] OR;  Service: General;  Laterality: N/A;   KNEE SURGERY     THYROIDECTOMY     Colonoscopy, 2011: Dr. Loreta Ave Isolated right-sided diverticulum Mucosal bleb at 60 cm (?  Pneumatosis?) Otherwise normal  Colonoscopy, 2005: Dr. Jeral Fruit sigmoid polyp 2 mucosal blebs  Family History  Problem Relation Age of Onset    Allergies Mother    Dementia Mother    ALS Father    Prostate cancer Father    Social History   Tobacco Use   Smoking status: Passive Smoke Exposure - Never Smoker   Smokeless tobacco: Never   Tobacco comments:    husband smoking cigars  Vaping Use   Vaping Use: Never used  Substance Use Topics   Alcohol use: Yes    Comment: occ   Drug use: Never   Current Outpatient Medications  Medication Sig Dispense Refill   albuterol (VENTOLIN HFA) 108 (90 Base) MCG/ACT inhaler Inhale two puffs every 4-6 hours if needed for cough or wheeze 18 g 2   amoxicillin (AMOXIL) 500 MG capsule      bismuth subsalicylate (PEPTO BISMOL) 262 MG/15ML suspension Take 30 mLs by mouth every 6 (six) hours as needed.     Calcium Carbonate-Vitamin D 600-400 MG-UNIT tablet Take 1 tablet by mouth 2 (two) times daily.     clotrimazole (LOTRIMIN) 1 % cream Apply topically.     desonide (DESOWEN) 0.05 % cream Apply topically 2 (two) times daily. 30 g 0   dicyclomine (BENTYL) 20 MG tablet TAKE 1 TABLET FOUR TIMES A DAY BEFORE MEALS AND AT BEDTIME 90 tablet 2   DIVIGEL 0.25 MG/0.25GM GEL Apply 1 application topically daily. Thigh area 1 each 5   famotidine (PEPCID) 40 MG tablet Take one tablet once daily as directed 90 tablet 1  fluticasone (FLONASE) 50 MCG/ACT nasal spray One spray each nostril daily as needed 48 g 1   Fluticasone-Salmeterol (ADVAIR DISKUS) 500-50 MCG/DOSE AEPB Inhale 1 puff into the lungs 2 (two) times daily. 60 each 5   fluticasone-salmeterol (ADVAIR) 100-50 MCG/ACT AEPB Inhale 1 puff into the lungs 2 (two) times daily. 60 each 3   hydrOXYzine (ATARAX/VISTARIL) 10 MG tablet Take one tablet every 8 hours as needed 180 tablet 1   ketoconazole (NIZORAL) 2 % shampoo Apply 1 application topically 2 (two) times a week. To affected areas on skin 120 mL 2   levothyroxine (SYNTHROID) 125 MCG tablet TAKE 1 TABLET DAILY 90 tablet 3   montelukast (SINGULAIR) 10 MG tablet TAKE 1 TABLET AT BEDTIME 90 tablet 3    Multiple Vitamins-Minerals (MULTIVITAMIN WITH MINERALS) tablet Take 1 tablet by mouth daily.     Olopatadine HCl (PATADAY) 0.7 % SOLN Apply 1 drop to eye daily. 7.5 mL 1   pantoprazole (PROTONIX) 40 MG tablet Take 1 tablet (40 mg total) by mouth daily. 90 tablet 3   rizatriptan (MAXALT) 10 MG tablet Take 1 tablet (10 mg total) by mouth as needed for migraine. Reported on 08/12/2015 18 tablet 2   Spacer/Aero-Holding Chambers (AEROCHAMBER PLUS) inhaler Use as instructed 1 each 2   topiramate (TOPAMAX) 100 MG tablet TAKE 1/2 TABLET IN THE MORNING AND TAKE 1 TABLET AT BEDTIME. 135 tablet 0   zolpidem (AMBIEN) 10 MG tablet TAKE ONE TABLET AT BEDTIME AS NEEDED FOR SLEEP. 90 tablet 0   No current facility-administered medications for this visit.   Allergies  Allergen Reactions   Sulfa Antibiotics    Sulfamethoxazole-Trimethoprim Swelling   Metronidazole Itching and Rash     Review of Systems: All systems reviewed and negative except where noted in HPI.    No results found.  Physical Exam: BP (!) 150/80   Pulse 95   Ht 5\' 4"  (1.626 m)   Wt 214 lb (97.1 kg)   BMI 36.73 kg/m  Constitutional: Pleasant,well-developed, African-American female in no acute distress. HEENT: Normocephalic and atraumatic. Conjunctivae are normal. No scleral icterus.  Cardiovascular: Normal rate, regular rhythm.  Pulmonary/chest: Effort normal and breath sounds normal.  Faint bilateral expiratory wheezing with forced expiration  Abdominal: Soft, nondistended,, nontender. Bowel sounds active throughout. There are no masses palpable. No hepatomegaly. Extremities: no edema Neurological: Alert and oriented to person place and time. Skin: Skin is warm and dry. No rashes noted. Psychiatric: Normal mood and affect. Behavior is normal.  CBC    Component Value Date/Time   WBC 5.1 04/15/2020 1435   WBC 5.1 03/14/2019 1006   RBC 4.74 04/15/2020 1435   RBC 4.62 03/14/2019 1006   HGB 14.4 04/15/2020 1435   HCT 42.4  04/15/2020 1435   PLT 242 03/14/2019 1006   MCV 90 04/15/2020 1435   MCH 30.4 04/15/2020 1435   MCH 31.4 03/14/2019 1006   MCHC 34.0 04/15/2020 1435   MCHC 33.6 03/14/2019 1006   RDW 13.4 04/15/2020 1435   LYMPHSABS 2.8 04/15/2020 1435   MONOABS 384 09/09/2016 1020   EOSABS 0.2 04/15/2020 1435   BASOSABS 0.0 04/15/2020 1435    CMP     Component Value Date/Time   NA 140 03/14/2019 1006   K 4.0 03/14/2019 1006   CL 107 03/14/2019 1006   CO2 27 03/14/2019 1006   GLUCOSE 88 03/14/2019 1006   BUN 11 03/14/2019 1006   CREATININE 0.90 03/14/2019 1006   CALCIUM 9.4 03/14/2019 1006  PROT 7.0 03/14/2019 1006   ALBUMIN 3.9 09/09/2016 1020   AST 15 03/14/2019 1006   ALT 19 03/14/2019 1006   ALKPHOS 51 09/09/2016 1020   BILITOT 0.3 03/14/2019 1006   GFRNONAA 62 09/09/2016 1020   GFRAA 72 09/09/2016 1020     ASSESSMENT AND PLAN: 70 year old female with asthma, depression and sleep apnea due for screening colonoscopy.  Her last colonoscopy in 2011 showed no polyps, but showed mucosal bleb which was also noted in 2005.  She has chronic symptoms of constipation and GERD.  For her constipation, I recommended she start taking Metamucil on a daily basis and she can continue to use the senna as needed.  I think this will give her more regular bowel movements and decrease her need to take senna.  This will also likely improve her domino pain.  For her GERD, I recommended she try just taking the Protonix alone as her symptoms are currently controlled with Pepcid and Protonix.  If she continues to have well-controlled GERD symptoms with once daily Protonix, then we can consider going to as needed Pepcid.  Colon cancer screening -Schedule for routine screening colonoscopy  Constipation - Daily Metamucil -Continue senna as needed  GERD -Continue Protonix, stop pepcid  The details, risks (including bleeding, perforation, infection, missed lesions, medication reactions and possible  hospitalization or surgery if complications occur), benefits, and alternatives to colonoscopy with possible biopsy and possible polypectomy were discussed with the patient and she consents to proceed.    Verbon Giangregorio E. Tomasa Rand, MD [facility]    CC:  Ardith Dark, MD

## 2021-02-19 NOTE — Patient Instructions (Signed)
If you are age 70 or older, your body mass index should be between 23-30. Your Body mass index is 36.73 kg/m. If this is out of the aforementioned range listed, please consider follow up with your Primary Care Provider.  If you are age 7 or younger, your body mass index should be between 19-25. Your Body mass index is 36.73 kg/m. If this is out of the aformentioned range listed, please consider follow up with your Primary Care Provider.   You have been scheduled for a colonoscopy. Please follow written instructions given to you at your visit today.  Please pick up your prep supplies at the pharmacy within the next 1-3 days. If you use inhalers (even only as needed), please bring them with you on the day of your procedure.  Please purchase Metamucil over the counter. Take as directed.   Continue Protonix.   The [facility] providers would like to encourage you to use [facility] to communicate with providers for non-urgent requests or questions.  Due to long hold times on the telephone, sending your provider a message by [facility] may be a faster and more efficient way to get a response.  Please allow 48 business hours for a response.  Please remember that this is for non-urgent requests.   Due to recent changes in healthcare laws, you may see the results of your imaging and laboratory studies on MyChart before your provider has had a chance to review them.  We understand that in some cases there may be results that are confusing or concerning to you. Not all laboratory results come back in the same time frame and the provider may be waiting for multiple results in order to interpret others.  Please give [place] 48 hours in order for your provider to thoroughly review all the results before contacting the office for clarification of your results.   It was a pleasure to see you today!  Thank you for trusting me with your gastrointestinal care!    Scott E. Tomasa Rand, MD

## 2021-02-20 DIAGNOSIS — M62559 Muscle wasting and atrophy, not elsewhere classified, unspecified thigh: Secondary | ICD-10-CM | POA: Diagnosis not present

## 2021-02-24 ENCOUNTER — Encounter: Payer: Self-pay | Admitting: Allergy & Immunology

## 2021-02-24 ENCOUNTER — Encounter: Payer: Self-pay | Admitting: Gastroenterology

## 2021-02-24 ENCOUNTER — Ambulatory Visit (INDEPENDENT_AMBULATORY_CARE_PROVIDER_SITE_OTHER): Payer: Medicare Other | Admitting: Allergy & Immunology

## 2021-02-24 ENCOUNTER — Other Ambulatory Visit: Payer: Self-pay

## 2021-02-24 VITALS — BP 130/84 | HR 84 | Temp 98.4°F | Ht 65.0 in | Wt 215.8 lb

## 2021-02-24 DIAGNOSIS — J3089 Other allergic rhinitis: Secondary | ICD-10-CM | POA: Diagnosis not present

## 2021-02-24 DIAGNOSIS — J4540 Moderate persistent asthma, uncomplicated: Secondary | ICD-10-CM | POA: Diagnosis not present

## 2021-02-24 DIAGNOSIS — K219 Gastro-esophageal reflux disease without esophagitis: Secondary | ICD-10-CM

## 2021-02-24 MED ORDER — AZELASTINE HCL 0.1 % NA SOLN: 1.0000 | Freq: Two times a day (BID) | NASAL | 5 refills | Status: DC

## 2021-02-24 MED ORDER — HYDROXYZINE HCL 25 MG PO TABS: 25.0000 mg | ORAL_TABLET | Freq: Two times a day (BID) | ORAL | 5 refills | Status: DC

## 2021-02-24 NOTE — Progress Notes (Signed)
FOLLOW UP  Date of Service/Encounter:  02/24/21   Assessment:   Moderate persistent asthma, uncomplicated   LPRD (laryngopharyngeal reflux disease)   Allergic rhinitis  Plan/Recommendations:   1. Moderate persistent asthma, uncomplicated - Lung testing looks good today. - We are not going to make any changes at this time. - Daily controller medication(s): Advair 500/72mcg one puff once daily - Prior to physical activity: albuterol 2 puffs 10-15 minutes before physical activity. - Rescue medications: albuterol 4 puffs every 4-6 hours as needed - Asthma control goals:  * Full participation in all desired activities (may need albuterol before activity) * Albuterol use two time or less a week on average (not counting use with activity) * Cough interfering with sleep two time or less a month * Oral steroids no more than once a year * No hospitalizations  2. LPRD (laryngopharyngeal reflux disease) - Continue with famotidine in the morning. - Continue with pantoprazole at night.   3. Allergic rhinitis - Continue with Flonase one spray per nostril daily as needed. - Add on Astelin one spray per nostril twice daily (can be used WITH the Flonase).  - Increase Atarax top 25mg  twice daily as needed (can cause sleepiness). t  4. Return in about 3 months (around 05/26/2021).    Subjective:   Brooke Schneider is a 70 y.o. female presenting today for follow up of No chief complaint on file.   Brooke Schneider has a history of the following: Patient Active Problem List   Diagnosis Date Noted   GERD (gastroesophageal reflux disease) 07/17/2019   OA (osteoarthritis) of knee s/p TKA 2021 04/13/2019   Allergic conjunctivitis 09/13/2016   MDD (major depressive disorder) 09/13/2016   Tinea versicolor 07/06/2016   IBS (irritable bowel syndrome) 01/02/2016   Hypothyroidism 10/17/2015   Migraine 11/27/2012   Insomnia 07/21/2009   Allergies 01/19/2008   Asthma, allergic  01/19/2008    History obtained from: chart review and patient.  Brooke Schneider is a 70 y.o. female presenting for a follow up visit.  At that time, she was continued on Dulera 100 mcg 2 puffs twice daily as well as Singulair 10 mg daily.  For her rhinitis, she was continued on Flonase as well as the Singulair.  She was also continue Atarax as needed and Pataday as needed.  Her reflux was controlled with omeprazole in the morning and famotidine in the evening.  Since last visit, she has had some worsening allergy symptoms. Other than that, she is doing very well.   Asthma/Respiratory Symptom History: She is no longer on the [facility]. She is now on Advair. She has not been having trouble with her breathing and she has not needed systemic steroids or the ED visit. She is sleeping well without nighttime awakenings.   Allergic Rhinitis Symptom History: She remains on the Singulair daily. She does have fluticasone but she has not used it. She was wondering about using Sudafed over the counter which did help. She has no cetirizine and her sister recommended Allegra.  Skin Symptom History: She does have some  itchy lesions on her bilateral arms as well as their legs. She does have some hydrocortisone to use as needed.   GERD Symptom History: She was told by GI to take pantoprazole at night and famotidine in the morning. She has overall been doing well.   She had sleep apnea in the past. She does well with that.   Otherwise, there have been no changes to her past  medical history, surgical history, family history, or social history.    Review of Systems  Constitutional: Negative.  Negative for chills, fever, malaise/fatigue and weight loss.  HENT:  Positive for congestion. Negative for ear discharge, ear pain and sinus pain.   Eyes:  Negative for pain, discharge and redness.  Respiratory:  Negative for cough, sputum production, shortness of breath and wheezing.   Cardiovascular: Negative.  Negative for chest  pain and palpitations.  Gastrointestinal:  Negative for abdominal pain, constipation, diarrhea, heartburn, nausea and vomiting.  Skin:  Positive for itching and rash.  Neurological:  Negative for dizziness and headaches.  Endo/Heme/Allergies:  Positive for environmental allergies. Does not bruise/bleed easily.      Objective:   Blood pressure 130/84, pulse 84, temperature 98.4 F (36.9 C), height 5\' 5"  (1.651 m), weight 215 lb 12.8 oz (97.9 kg), SpO2 95 %. Body mass index is 35.91 kg/m.   Physical Exam:  Physical Exam Vitals reviewed.  Constitutional:      Appearance: She is well-developed.  HENT:     Head: Normocephalic and atraumatic.     Right Ear: Tympanic membrane, ear canal and external ear normal. No drainage, swelling or tenderness. Tympanic membrane is not injected, scarred, erythematous, retracted or bulging.     Left Ear: Tympanic membrane, ear canal and external ear normal. No drainage, swelling or tenderness. Tympanic membrane is not injected, scarred, erythematous, retracted or bulging.     Nose: No nasal deformity, septal deviation, mucosal edema or rhinorrhea.     Right Turbinates: Enlarged, swollen and pale.     Left Turbinates: Enlarged, swollen and pale.     Right Sinus: No maxillary sinus tenderness or frontal sinus tenderness.     Left Sinus: No maxillary sinus tenderness or frontal sinus tenderness.     Mouth/Throat:     Mouth: Mucous membranes are not pale and not dry.     Pharynx: Uvula midline.     Comments: Mild cobblestoning in the posterior oropharynx.  Eyes:     General: Lids are normal. No allergic shiner.       Right eye: No discharge.        Left eye: No discharge.     Conjunctiva/sclera: Conjunctivae normal.     Right eye: Right conjunctiva is not injected. No chemosis.    Left eye: Left conjunctiva is not injected. No chemosis.    Pupils: Pupils are equal, round, and reactive to light.  Cardiovascular:     Rate and Rhythm: Normal rate and  regular rhythm.     Heart sounds: Normal heart sounds.  Pulmonary:     Effort: Pulmonary effort is normal. No tachypnea, accessory muscle usage or respiratory distress.     Breath sounds: Normal breath sounds. No wheezing, rhonchi or rales.     Comments: Moving air well in all lung fields. No increased work of breathing noted.  Chest:     Chest wall: No tenderness.  Abdominal:     Tenderness: There is no abdominal tenderness. There is no guarding or rebound.  Lymphadenopathy:     Head:     Right side of head: No submandibular, tonsillar or occipital adenopathy.     Left side of head: No submandibular, tonsillar or occipital adenopathy.     Cervical: No cervical adenopathy.  Skin:    General: Skin is warm.     Capillary Refill: Capillary refill takes less than 2 seconds.     Coloration: Skin is not pale.  Findings: No abrasion, erythema, petechiae or rash. Rash is not papular, urticarial or vesicular.     Comments: She does have some lesions on her bilateral arms that are healed over.   Neurological:     Mental Status: She is alert.  Psychiatric:        Behavior: Behavior is cooperative.     Diagnostic studies:    Spirometry: results normal (FEV1: 1.88/98%, FVC: 2.31/93%, FEV1/FVC: 81%).   Spirometry consistent with normal pattern.  Allergy Studies:  none       Malachi Bonds, MD  [facility]

## 2021-02-24 NOTE — Patient Instructions (Addendum)
1. Moderate persistent asthma, uncomplicated - Lung testing looks good today. - We are not going to make any changes at this time. - Daily controller medication(s): Advair 500/19mcg one puff once daily - Prior to physical activity: albuterol 2 puffs 10-15 minutes before physical activity. - Rescue medications: albuterol 4 puffs every 4-6 hours as needed - Asthma control goals:  * Full participation in all desired activities (may need albuterol before activity) * Albuterol use two time or less a week on average (not counting use with activity) * Cough interfering with sleep two time or less a month * Oral steroids no more than once a year * No hospitalizations  2. LPRD (laryngopharyngeal reflux disease) - Continue with famotidine in the morning. - Continue with pantoprazole at night.   3. Allergic rhinitis - Continue with Flonase one spray per nostril daily as needed. - Add on Astelin one spray per nostril twice daily (can be used WITH the Flonase).  - Increase Atarax top 25mg  twice daily as needed (can cause sleepiness). t  4. Return in about 3 months (around 05/26/2021).    Please inform 05/28/2021 of any Emergency Department visits, hospitalizations, or changes in symptoms. Call us before going to the ED for breathing or allergy symptoms since we might be able to fit you in for a sick visit. Feel free to contact us anytime with any questions, problems, or concerns.  It was a pleasure to see you again today!   Websites that have reliable patient information: 1. American Academy of Asthma, Allergy, and Immunology: www.aaaai.org 2. Food Allergy Research and Education (FARE): foodallergy.org 3. Mothers of Asthmatics: http://www.asthmacommunitynetwork.org 4. American College of Allergy, Asthma, and Immunology: www.acaai.org   COVID-19 Vaccine Information can be found at: [place] For questions related to vaccine  distribution or appointments, please email [email or web address] or call [phone].     "Like" [phone] on Facebook and Instagram for our latest updates!      Make sure you are registered to vote! If you have moved or changed any of your contact information, you will need to get this updated before voting!  In some cases, you MAY be able to register to vote online: [place]

## 2021-02-25 DIAGNOSIS — M62559 Muscle wasting and atrophy, not elsewhere classified, unspecified thigh: Secondary | ICD-10-CM | POA: Diagnosis not present

## 2021-02-27 DIAGNOSIS — M62559 Muscle wasting and atrophy, not elsewhere classified, unspecified thigh: Secondary | ICD-10-CM | POA: Diagnosis not present

## 2021-03-02 DIAGNOSIS — M62559 Muscle wasting and atrophy, not elsewhere classified, unspecified thigh: Secondary | ICD-10-CM | POA: Diagnosis not present

## 2021-03-03 ENCOUNTER — Ambulatory Visit (INDEPENDENT_AMBULATORY_CARE_PROVIDER_SITE_OTHER): Payer: Medicare Other | Admitting: Family Medicine

## 2021-03-03 ENCOUNTER — Encounter: Payer: Self-pay | Admitting: Family Medicine

## 2021-03-03 ENCOUNTER — Other Ambulatory Visit: Payer: Self-pay

## 2021-03-03 VITALS — BP 125/83 | HR 97 | Temp 98.3°F | Ht 65.0 in | Wt 213.2 lb

## 2021-03-03 DIAGNOSIS — E039 Hypothyroidism, unspecified: Secondary | ICD-10-CM

## 2021-03-03 DIAGNOSIS — K582 Mixed irritable bowel syndrome: Secondary | ICD-10-CM

## 2021-03-03 DIAGNOSIS — F5101 Primary insomnia: Secondary | ICD-10-CM | POA: Diagnosis not present

## 2021-03-03 DIAGNOSIS — Z23 Encounter for immunization: Secondary | ICD-10-CM | POA: Diagnosis not present

## 2021-03-03 DIAGNOSIS — F331 Major depressive disorder, recurrent, moderate: Secondary | ICD-10-CM

## 2021-03-03 DIAGNOSIS — K219 Gastro-esophageal reflux disease without esophagitis: Secondary | ICD-10-CM

## 2021-03-03 DIAGNOSIS — G43909 Migraine, unspecified, not intractable, without status migrainosus: Secondary | ICD-10-CM | POA: Diagnosis not present

## 2021-03-03 MED ORDER — BUPROPION HCL ER (XL) 150 MG PO TB24: 150.0000 mg | ORAL_TABLET | Freq: Every day | ORAL | 3 refills | Status: DC

## 2021-03-03 MED ORDER — TOPIRAMATE 100 MG PO TABS: ORAL_TABLET | ORAL | 3 refills | Status: DC

## 2021-03-03 MED ORDER — ZOLPIDEM TARTRATE 10 MG PO TABS: 10.0000 mg | ORAL_TABLET | Freq: Every day | ORAL | 0 refills | Status: DC

## 2021-03-03 MED ORDER — LEVOTHYROXINE SODIUM 125 MCG PO TABS: 125.0000 ug | ORAL_TABLET | Freq: Every day | ORAL | 3 refills | Status: DC

## 2021-03-03 MED ORDER — PANTOPRAZOLE SODIUM 40 MG PO TBEC: 40.0000 mg | DELAYED_RELEASE_TABLET | Freq: Two times a day (BID) | ORAL | 3 refills | Status: DC

## 2021-03-03 MED ORDER — RIZATRIPTAN BENZOATE 10 MG PO TABS: 10.0000 mg | ORAL_TABLET | ORAL | 2 refills | Status: DC | PRN

## 2021-03-03 NOTE — Assessment & Plan Note (Signed)
She has been Topamax and Maxalt.  We will refill both of these today.  Advised to recheck with me in a week or so for that how this is doing.

## 2021-03-03 NOTE — Patient Instructions (Signed)
It was very nice to see you today!  We will get your flu shot today.  You can increase your Protonix to 40 mg twice daily.  We will restart your Wellbutrin, Topamax, and Maxalt.  Please send a message in a few weeks to let me know how these are working for you  I will see you back in 6 months.  We should check blood work at that time.  Please come back to see me sooner if needed.  Take care, Dr Jimmey Ralph  PLEASE NOTE:  If you had any lab tests please let us know if you have not heard back within a few days. You may see your results on mychart before we have a chance to review them but we will give you a call once they are reviewed by [place]. If we ordered any referrals today, please let us know if you have not heard from their office within the next week.   Please try these tips to maintain a healthy lifestyle:  Eat at least 3 REAL meals and 1-2 snacks per day.  Aim for no more than 5 hours between eating.  If you eat breakfast, please do so within one hour of getting up.   Each meal should contain half fruits/vegetables, one quarter protein, and one quarter carbs (no bigger than a computer mouse)  Cut down on sweet beverages. This includes juice, soda, and sweet tea.   Drink at least 1 glass of water with each meal and aim for at least 8 glasses per day  Exercise at least 150 minutes every week.

## 2021-03-03 NOTE — Assessment & Plan Note (Signed)
Doing better.  She has been working on diet modifications.  Follows with GI.

## 2021-03-03 NOTE — Assessment & Plan Note (Signed)
Feeling more down lately.  She has been on Wellbutrin in the past and has done well with this.  We will restart today 150 mg daily.  She will check in with me in a couple weeks via MyChart.

## 2021-03-03 NOTE — Assessment & Plan Note (Signed)
On Ambien.  She is having some difficulty sleeping recently.  This could be due to underlying depression as well.  We will be starting Wellbutrin as below.  She has done well with this in the past.  May consider trial of alternative to Ambien if continues to be an issue.

## 2021-03-03 NOTE — Assessment & Plan Note (Signed)
Deferred blood work today.  Continue Synthroid 125 mcg daily.  We can check TSH next time.

## 2021-03-03 NOTE — Assessment & Plan Note (Signed)
Follows with GI.  Still having some issues.  Will increase Protonix to 40 mg twice daily.Advised her to follow up with GI soon.

## 2021-03-03 NOTE — Progress Notes (Signed)
   Brooke Schneider is a 70 y.o. female who presents today for an office visit.  Assessment/Plan:  Chronic Problems Addressed Today: IBS (irritable bowel syndrome) Doing better.  She has been working on diet modifications.  Follows with GI.  Insomnia On Ambien.  She is having some difficulty sleeping recently.  This could be due to underlying depression as well.  We will be starting Wellbutrin as below.  She has done well with this in the past.  May consider trial of alternative to Ambien if continues to be an issue.  Hypothyroidism Deferred blood work today.  Continue Synthroid 125 mcg daily.  We can check TSH next time.  Migraine She has been Topamax and Maxalt.  We will refill both of these today.  Advised to recheck with me in a week or so for that how this is doing.  GERD (gastroesophageal reflux disease) Follows with GI.  Still having some issues.  Will increase Protonix to 40 mg twice daily.Advised her to follow up with GI soon.   MDD (major depressive disorder) Feeling more down lately.  She has been on Wellbutrin in the past and has done well with this.  We will restart today 150 mg daily.  She will check in with me in a couple weeks via MyChart.  Flu shot given today.     Subjective:  HPI:  She is here for follow up.  She was last seen in the office on 09/26/2020.   See A/p for status of chronic conditions.   She still had issue with migraine. She tried Tynelol with mild relief. She was prescribed Topamax 50-100  mg and Maxalt for migraine at the previous visit.  In addition to this, she is requesting refill for Synthroid 125 mg for hypothyroidism.    She has had issue with MDD. She would like to restart  Wellbutrin for major depressive disorder.   She was started on Pepcid for GERD. She reports it has been helping with the symptoms. However, She would like to start on Protonix. She would like receive flu vaccine today.        Objective:  Physical Exam: BP 125/83    Pulse 97   Temp 98.3 F (36.8 C) (Temporal)   Ht 5\' 5"  (1.651 m)   Wt 213 lb 3.2 oz (96.7 kg)   SpO2 98%   BMI 35.48 kg/m   Gen: No acute distress, resting comfortably CV: Regular rate and rhythm with no murmurs appreciated Pulm: Normal work of breathing, clear to auscultation bilaterally with no crackles, wheezes, or rhonchi Neuro: Grossly normal, moves all extremities Psych: Normal affect and thought content        I,Savera Zaman,acting as a scribe for , MD.,have documented all relevant documentation on the behalf of Jacquiline Doe, MD,as directed by  Jacquiline Doe, MD while in the presence of Jacquiline Doe, MD.   I, Jacquiline Doe, MD, have reviewed all documentation for this visit. The documentation on 03/03/21 for the exam, diagnosis, procedures, and orders are all accurate and complete.  03/05/21. Katina Degree, MD 03/03/2021 2:30 PM

## 2021-03-04 ENCOUNTER — Telehealth: Payer: Self-pay | Admitting: Allergy & Immunology

## 2021-03-04 ENCOUNTER — Other Ambulatory Visit: Payer: Self-pay | Admitting: *Deleted

## 2021-03-04 MED ORDER — FLUTICASONE-SALMETEROL 500-50 MCG/ACT IN AEPB: 1.0000 | INHALATION_SPRAY | Freq: Every day | RESPIRATORY_TRACT | 2 refills | Status: DC

## 2021-03-04 NOTE — Telephone Encounter (Signed)
Refills have been sent in. Called patient and informed. Patient verbalized understanding.  ?

## 2021-03-04 NOTE — Telephone Encounter (Signed)
Patient was seen 02/24/21. She uses Advair Diskus. She said when she was talking with the nurse at her appointment, she couldn't remember if it was the 100 or 500. She told the nurse 100, but know realizes it was the 500. She would like the 500 sent into [facility].

## 2021-03-20 ENCOUNTER — Telehealth (INDEPENDENT_AMBULATORY_CARE_PROVIDER_SITE_OTHER): Payer: Medicare Other | Admitting: Family

## 2021-03-20 ENCOUNTER — Encounter: Payer: Self-pay | Admitting: Family

## 2021-03-20 VITALS — Ht 65.0 in | Wt 213.0 lb

## 2021-03-20 DIAGNOSIS — R5383 Other fatigue: Secondary | ICD-10-CM | POA: Insufficient documentation

## 2021-03-20 DIAGNOSIS — G43909 Migraine, unspecified, not intractable, without status migrainosus: Secondary | ICD-10-CM | POA: Diagnosis not present

## 2021-03-20 DIAGNOSIS — R110 Nausea: Secondary | ICD-10-CM | POA: Insufficient documentation

## 2021-03-20 NOTE — Progress Notes (Signed)
MyChart Video Visit    Virtual Visit via Video Note   This visit type was conducted due to national recommendations for restrictions regarding the COVID-19 Pandemic (e.g. social distancing) in an effort to limit this patient's exposure and mitigate transmission in our community. This patient is at least at moderate risk for complications without adequate follow up. This format is felt to be most appropriate for this patient at this time. Physical exam was limited by quality of the video and audio technology used for the visit. CMA was able to get the patient set up on a video visit.  Patient location: Home. Patient and provider in visit Provider location: Office  I discussed the limitations of evaluation and management by telemedicine and the availability of in person appointments. The patient expressed understanding and agreed to proceed.  Visit Date: 03/20/2021  Today's healthcare provider: Dulce Sellar, NP     Subjective:    Patient ID: Brooke Schneider, female    DOB: Jul 07, 1950, 70 y.o.   MRN: [id number]  Chief Complaint  Patient presents with   Acute Visit    Body aches, nausea, headaches, fatigue and loss of appetite since she got the flu shot on 03/03/2021. No been tested for covid.     HPI Pt reports feeling weak, nauseas, fatigued, feels like her throat lymph nodes are enlarged. Also reports having to take her Maxalt more often, daily for the last week. States her PCP recently increased her hydroxyzine and protonix to bid, and discontinued the pepcid.    Past Medical History:  Diagnosis Date   Allergic rhinitis    Arthritis    Asthma    Depression    Goiter    Goiter    Headache(784.0)    Sleep apnea     Past Surgical History:  Procedure Laterality Date   ABDOMINAL HYSTERECTOMY     CHOLECYSTECTOMY N/A 11/02/2013   Procedure: LAPAROSCOPIC CHOLECYSTECTOMY ;  Surgeon: Cherylynn Ridges, MD;  Location: [facility] OR;  Service: General;  Laterality: N/A;   KNEE  SURGERY     THYROIDECTOMY      Outpatient Medications Prior to Visit  Medication Sig Dispense Refill   albuterol (VENTOLIN HFA) 108 (90 Base) MCG/ACT inhaler Inhale two puffs every 4-6 hours if needed for cough or wheeze 18 g 2   azelastine (ASTELIN) 0.1 % nasal spray Place 1 spray into both nostrils 2 (two) times daily. Use in each nostril as directed 30 mL 5   buPROPion (WELLBUTRIN XL) 150 MG 24 hr tablet Take 1 tablet (150 mg total) by mouth daily. 90 tablet 3   Calcium Carbonate-Vitamin D 600-400 MG-UNIT tablet Take 1 tablet by mouth 2 (two) times daily.     desonide (DESOWEN) 0.05 % cream Apply topically 2 (two) times daily. 30 g 0   fluticasone (FLONASE) 50 MCG/ACT nasal spray One spray each nostril daily as needed 48 g 1   Fluticasone-Salmeterol (ADVAIR DISKUS) 500-50 MCG/DOSE AEPB Inhale 1 puff into the lungs 2 (two) times daily. 60 each 5   fluticasone-salmeterol (ADVAIR) 500-50 MCG/ACT AEPB Inhale 1 puff into the lungs daily. 60 each 2   hydrOXYzine (ATARAX/VISTARIL) 25 MG tablet Take 1 tablet (25 mg total) by mouth in the morning and at bedtime. 60 tablet 5   levothyroxine (SYNTHROID) 125 MCG tablet Take 1 tablet (125 mcg total) by mouth daily. 90 tablet 3   montelukast (SINGULAIR) 10 MG tablet TAKE 1 TABLET AT BEDTIME 90 tablet 3   Multiple Vitamins-Minerals (  MULTIVITAMIN WITH MINERALS) tablet Take 1 tablet by mouth daily.     Olopatadine HCl (PATADAY) 0.7 % SOLN Apply 1 drop to eye daily. 7.5 mL 1   pantoprazole (PROTONIX) 40 MG tablet Take 1 tablet (40 mg total) by mouth 2 (two) times daily. 180 tablet 3   rizatriptan (MAXALT) 10 MG tablet Take 1 tablet (10 mg total) by mouth as needed for migraine. Reported on 08/12/2015 18 tablet 2   topiramate (TOPAMAX) 100 MG tablet TAKE 1/2 TABLET IN THE MORNING AND TAKE 1 TABLET AT BEDTIME. 135 tablet 3   zolpidem (AMBIEN) 10 MG tablet Take 1 tablet (10 mg total) by mouth at bedtime. 90 tablet 0   amoxicillin (AMOXIL) 500 MG capsule  (Patient  not taking: Reported on 03/20/2021)     bismuth subsalicylate (PEPTO BISMOL) 262 MG/15ML suspension Take 30 mLs by mouth every 6 (six) hours as needed. (Patient not taking: Reported on 03/20/2021)     clotrimazole (LOTRIMIN) 1 % cream Apply topically. (Patient not taking: Reported on 03/20/2021)     dicyclomine (BENTYL) 20 MG tablet TAKE 1 TABLET FOUR TIMES A DAY BEFORE MEALS AND AT BEDTIME (Patient not taking: Reported on 03/20/2021) 90 tablet 2   DIVIGEL 0.25 MG/0.25GM GEL Apply 1 application topically daily. Thigh area (Patient not taking: Reported on 03/20/2021) 1 each 5   famotidine (PEPCID) 40 MG tablet Take one tablet once daily as directed (Patient not taking: Reported on 03/20/2021) 90 tablet 1   ketoconazole (NIZORAL) 2 % shampoo Apply 1 application topically 2 (two) times a week. To affected areas on skin (Patient not taking: No sig reported) 120 mL 2   Spacer/Aero-Holding Chambers (AEROCHAMBER PLUS) inhaler Use as instructed (Patient not taking: No sig reported) 1 each 2   SUTAB [phone] MG TABS Take by mouth. (Patient not taking: Reported on 03/20/2021)     hydrOXYzine (ATARAX/VISTARIL) 10 MG tablet Take one tablet every 8 hours as needed (Patient not taking: Reported on 03/20/2021) 180 tablet 1   No facility-administered medications prior to visit.    Allergies  Allergen Reactions   Sulfa Antibiotics    Sulfamethoxazole-Trimethoprim Swelling   Metronidazole Itching and Rash        Objective:    Physical Exam Vitals and nursing note reviewed.  Constitutional:      General: She is not in acute distress.    Appearance: Normal appearance.  HENT:     Head: Normocephalic.  Pulmonary:     Effort: No respiratory distress.  Musculoskeletal:     Cervical back: Normal range of motion.  Skin:    General: Skin is dry.     Coloration: Skin is not pale.  Neurological:     Mental Status: She is alert and oriented to person, place, and time.  Psychiatric:        Mood and  Affect: Mood normal.    Ht 5\' 5"  (1.651 m)   Wt 213 lb (96.6 kg)   BMI 35.45 kg/m   Wt Readings from Last 3 Encounters:  03/20/21 213 lb (96.6 kg)  03/03/21 213 lb 3.2 oz (96.7 kg)  02/24/21 215 lb 12.8 oz (97.9 kg)       Assessment & Plan:   Problem List Items Addressed This Visit       Cardiovascular and Mediastinum   Migraine    Pt taking Maxalt daily for the last week, and taking Topiramate 1 pill bid.. Advised may be r/t her allergies and to increase her Flonase to  1 spray each nostril bid, continue inhalers.         Other   Nausea in adult    Reports some improvement since increasing protonix to bid.      Fatigue - Primary    Advised pt to cut her hydroxyzine in half in the am, 1 pill qpm.        I am having Brooke Schneider maintain her Calcium Carbonate-Vitamin D, multivitamin with minerals, AeroChamber Plus, clotrimazole, ketoconazole, Pataday, desonide, fluticasone, famotidine, albuterol, amoxicillin, Fluticasone-Salmeterol, dicyclomine, Divigel, montelukast, bismuth subsalicylate, hydrOXYzine, azelastine, pantoprazole, topiramate, rizatriptan, levothyroxine, zolpidem, buPROPion, fluticasone-salmeterol, and Sutab.  No orders of the defined types were placed in this encounter.   I discussed the assessment and treatment plan with the patient. The patient was provided an opportunity to ask questions and all were answered. The patient agreed with the plan and demonstrated an understanding of the instructions.   The patient was advised to call back or seek an in-person evaluation if the symptoms worsen or if the condition fails to improve as anticipated.  I provided 25 minutes of face-to-face time during this encounter.   Dulce Sellar, NP [facility] [place] [phone] (phone) [phone] (fax)  [facility]

## 2021-03-20 NOTE — Assessment & Plan Note (Signed)
Reports some improvement since increasing protonix to bid.

## 2021-03-20 NOTE — Assessment & Plan Note (Signed)
Pt taking Maxalt daily for the last week, and taking Topiramate 1 pill bid.. Advised may be r/t her allergies and to increase her Flonase to 1 spray each nostril bid, continue inhalers.

## 2021-03-20 NOTE — Assessment & Plan Note (Addendum)
Advised pt to cut her hydroxyzine in half in the am, 1 pill qpm.

## 2021-04-01 DIAGNOSIS — M62559 Muscle wasting and atrophy, not elsewhere classified, unspecified thigh: Secondary | ICD-10-CM | POA: Diagnosis not present

## 2021-04-09 ENCOUNTER — Encounter: Payer: Medicare Other | Admitting: Gastroenterology

## 2021-04-13 ENCOUNTER — Encounter: Payer: Self-pay | Admitting: Family

## 2021-04-13 ENCOUNTER — Other Ambulatory Visit: Payer: Self-pay

## 2021-04-13 ENCOUNTER — Ambulatory Visit (INDEPENDENT_AMBULATORY_CARE_PROVIDER_SITE_OTHER): Payer: Medicare Other | Admitting: Family

## 2021-04-13 VITALS — BP 114/76 | HR 76 | Temp 97.8°F | Ht 65.0 in | Wt 208.4 lb

## 2021-04-13 DIAGNOSIS — J3089 Other allergic rhinitis: Secondary | ICD-10-CM

## 2021-04-13 DIAGNOSIS — H1013 Acute atopic conjunctivitis, bilateral: Secondary | ICD-10-CM | POA: Diagnosis not present

## 2021-04-13 DIAGNOSIS — R591 Generalized enlarged lymph nodes: Secondary | ICD-10-CM | POA: Insufficient documentation

## 2021-04-13 DIAGNOSIS — J309 Allergic rhinitis, unspecified: Secondary | ICD-10-CM | POA: Insufficient documentation

## 2021-04-13 NOTE — Assessment & Plan Note (Signed)
Pt has not been using her Azelastin nasal spray regularly, advised ok to use this prior to using the Flonase nasal spray. Advised on hydrating with 64oz water qd.

## 2021-04-13 NOTE — Assessment & Plan Note (Signed)
Cervical, slightly enlarged with tenderness, denies any sore throat, or difficulty swallowing. Pt having increased allergy sx, and believe related to this, advised to call office if nodes worsen in pain or size or not resolved in another 2 weeks.

## 2021-04-13 NOTE — Progress Notes (Signed)
Subjective:     Patient ID: Brooke Schneider, female    DOB: [birth year], 70 y.o.   MRN: [id number]  Chief Complaint  Patient presents with   Adenopathy    4 days ago on and off.    HPI: Pt reports swollen, tender lymph nodes in her neck. She is concerned as she had sx after having her flu shot along with fatigue at that time. Also reports runny nose and irritated eyes with itching and increased drainage and started taking Claritin OTC.  Health Maintenance Due  Topic Date Due   Zoster Vaccines- Shingrix (1 of 2) Never done   DEXA SCAN  Never done   TETANUS/TDAP  07/12/2016   Pneumonia Vaccine 46+ Years old (3 - PPSV23 if available, else PCV20) 06/14/2017   MAMMOGRAM  07/20/2017   COLONOSCOPY (Pts 45-4yrs Insurance coverage will need to be confirmed)  06/08/2019   COVID-19 Vaccine (4 - Booster for Pfizer series) 09/01/2020    Past Medical History:  Diagnosis Date   Allergic rhinitis    Arthritis    Asthma    Depression    Goiter    Goiter    Headache(784.0)    Sleep apnea     Past Surgical History:  Procedure Laterality Date   ABDOMINAL HYSTERECTOMY     CHOLECYSTECTOMY N/A 11/02/2013   Procedure: LAPAROSCOPIC CHOLECYSTECTOMY ;  Surgeon: Cherylynn Ridges, MD;  Location: [facility] OR;  Service: General;  Laterality: N/A;   KNEE SURGERY     THYROIDECTOMY      Outpatient Medications Prior to Visit  Medication Sig Dispense Refill   albuterol (VENTOLIN HFA) 108 (90 Base) MCG/ACT inhaler Inhale two puffs every 4-6 hours if needed for cough or wheeze 18 g 2   bismuth subsalicylate (PEPTO BISMOL) 262 MG/15ML suspension Take 30 mLs by mouth every 6 (six) hours as needed.     buPROPion (WELLBUTRIN XL) 150 MG 24 hr tablet Take 1 tablet (150 mg total) by mouth daily. 90 tablet 3   Calcium Carbonate-Vitamin D 600-400 MG-UNIT tablet Take 1 tablet by mouth 2 (two) times daily.     clotrimazole (LOTRIMIN) 1 % cream Apply topically.     desonide (DESOWEN) 0.05 % cream Apply topically 2 (two)  times daily. 30 g 0   dicyclomine (BENTYL) 20 MG tablet TAKE 1 TABLET FOUR TIMES A DAY BEFORE MEALS AND AT BEDTIME 90 tablet 2   DIVIGEL 0.25 MG/0.25GM GEL Apply 1 application topically daily. Thigh area 1 each 5   fluticasone (FLONASE) 50 MCG/ACT nasal spray One spray each nostril daily as needed 48 g 1   Fluticasone-Salmeterol (ADVAIR DISKUS) 500-50 MCG/DOSE AEPB Inhale 1 puff into the lungs 2 (two) times daily. 60 each 5   fluticasone-salmeterol (ADVAIR) 500-50 MCG/ACT AEPB Inhale 1 puff into the lungs daily. 60 each 2   ketoconazole (NIZORAL) 2 % shampoo Apply 1 application topically 2 (two) times a week. To affected areas on skin 120 mL 2   levothyroxine (SYNTHROID) 125 MCG tablet Take 1 tablet (125 mcg total) by mouth daily. 90 tablet 3   montelukast (SINGULAIR) 10 MG tablet TAKE 1 TABLET AT BEDTIME 90 tablet 3   Multiple Vitamins-Minerals (MULTIVITAMIN WITH MINERALS) tablet Take 1 tablet by mouth daily.     Olopatadine HCl (PATADAY) 0.7 % SOLN Apply 1 drop to eye daily. 7.5 mL 1   pantoprazole (PROTONIX) 40 MG tablet Take 1 tablet (40 mg total) by mouth 2 (two) times daily. 180 tablet 3  rizatriptan (MAXALT) 10 MG tablet Take 1 tablet (10 mg total) by mouth as needed for migraine. Reported on 08/12/2015 18 tablet 2   Spacer/Aero-Holding Chambers (AEROCHAMBER PLUS) inhaler Use as instructed 1 each 2   SUTAB [phone] MG TABS Take by mouth.     topiramate (TOPAMAX) 100 MG tablet TAKE 1/2 TABLET IN THE MORNING AND TAKE 1 TABLET AT BEDTIME. 135 tablet 3   zolpidem (AMBIEN) 10 MG tablet Take 1 tablet (10 mg total) by mouth at bedtime. 90 tablet 0   famotidine (PEPCID) 40 MG tablet Take one tablet once daily as directed 90 tablet 1   hydrOXYzine (ATARAX/VISTARIL) 25 MG tablet Take 1 tablet (25 mg total) by mouth in the morning and at bedtime. 60 tablet 5   azelastine (ASTELIN) 0.1 % nasal spray Place 1 spray into both nostrils 2 (two) times daily. Use in each nostril as directed 30 mL 5    amoxicillin (AMOXIL) 500 MG capsule  (Patient not taking: No sig reported)     No facility-administered medications prior to visit.    Allergies  Allergen Reactions   Sulfa Antibiotics    Sulfamethoxazole-Trimethoprim Swelling   Metronidazole Itching and Rash        Objective:    Physical Exam Vitals and nursing note reviewed.  Constitutional:      Appearance: Normal appearance.  HENT:     Right Ear: Tympanic membrane and ear canal normal.     Left Ear: Tympanic membrane and ear canal normal.     Mouth/Throat:     Mouth: Mucous membranes are moist.     Pharynx: No pharyngeal swelling or posterior oropharyngeal erythema.  Cardiovascular:     Rate and Rhythm: Normal rate and regular rhythm.  Pulmonary:     Effort: Pulmonary effort is normal.     Breath sounds: Normal breath sounds.  Musculoskeletal:        General: Normal range of motion.  Lymphadenopathy:     Cervical: Cervical adenopathy present.     Right cervical: Superficial cervical adenopathy present.     Left cervical: Superficial cervical adenopathy present.  Skin:    General: Skin is warm and dry.  Neurological:     Mental Status: She is alert.  Psychiatric:        Mood and Affect: Mood normal.        Behavior: Behavior normal.    BP 114/76   Pulse 76   Temp 97.8 F (36.6 C) (Temporal)   Ht 5\' 5"  (1.651 m)   Wt 208 lb 6.4 oz (94.5 kg)   SpO2 99%   BMI 34.68 kg/m  Wt Readings from Last 3 Encounters:  04/13/21 208 lb 6.4 oz (94.5 kg)  03/20/21 213 lb (96.6 kg)  03/03/21 213 lb 3.2 oz (96.7 kg)       Assessment & Plan:   Problem List Items Addressed This Visit       Respiratory   Allergic rhinitis due to allergen    Pt has not been using her Azelastin nasal spray regularly, advised ok to use this prior to using the Flonase nasal spray. Advised on hydrating with 64oz water qd.         Immune and Lymphatic   Lymphadenopathy - Primary    Cervical, slightly enlarged with tenderness, denies  any sore throat, or difficulty swallowing. Pt having increased allergy sx, and believe related to this, advised to call office if nodes worsen in pain or size or not resolved in another  2 weeks.        Other   Allergic conjunctivitis    Reports sx today, has not been using the Pataday eye drops regularly, advised to restart this, 1 drop each eye qd.       No orders of the defined types were placed in this encounter.

## 2021-04-13 NOTE — Patient Instructions (Signed)
It was very nice to see you today!  As discussed, please restart your Flonase nasal spray, 1 spray each nostril once or twice a day. OK to use this after your Azelastin nasal spray. Ok to continue the  Pataday eye drops and generic Claritin medication as well to help with your allergy symptoms.    PLEASE NOTE:  If you had any lab tests please let us know if you have not heard back within a few days. You may see your results on mychart before we have a chance to review them but we will give you a call once they are reviewed by [place]. If we ordered any referrals today, please let us know if you have not heard from their office within the next week.   Please try these tips to maintain a healthy lifestyle:  Eat most of your calories during the day when you are active. Eliminate processed foods including packaged sweets (pies, cakes, cookies), reduce intake of potatoes, white bread, white pasta, and white rice. Look for whole grain options, oat flour or almond flour.  Each meal should contain half fruits/vegetables, one quarter protein, and one quarter carbs (no bigger than a computer mouse).  Cut down on sweet beverages. This includes juice, soda, and sweet tea. Also watch fruit intake, though this is a healthier sweet option, it still contains natural sugar! Limit to 3 servings daily.  Drink at least 1 glass of water with each meal and aim for at least 8 glasses per day  Exercise at least 150 minutes every week.

## 2021-04-13 NOTE — Assessment & Plan Note (Signed)
Reports sx today, has not been using the Pataday eye drops regularly, advised to restart this, 1 drop each eye qd.

## 2021-05-05 ENCOUNTER — Encounter: Payer: Medicare Other | Admitting: Gastroenterology

## 2021-05-06 DIAGNOSIS — H04123 Dry eye syndrome of bilateral lacrimal glands: Secondary | ICD-10-CM | POA: Diagnosis not present

## 2021-05-06 DIAGNOSIS — H1045 Other chronic allergic conjunctivitis: Secondary | ICD-10-CM | POA: Diagnosis not present

## 2021-05-06 DIAGNOSIS — H25813 Combined forms of age-related cataract, bilateral: Secondary | ICD-10-CM | POA: Diagnosis not present

## 2021-05-11 ENCOUNTER — Encounter: Payer: Medicare Other | Admitting: Gastroenterology

## 2021-05-18 ENCOUNTER — Other Ambulatory Visit: Payer: Self-pay | Admitting: Allergy and Immunology

## 2021-05-20 ENCOUNTER — Other Ambulatory Visit: Payer: Self-pay | Admitting: Allergy and Immunology

## 2021-05-28 ENCOUNTER — Other Ambulatory Visit: Payer: Self-pay | Admitting: Family Medicine

## 2021-05-28 DIAGNOSIS — N398 Other specified disorders of urinary system: Secondary | ICD-10-CM | POA: Diagnosis not present

## 2021-05-28 DIAGNOSIS — R338 Other retention of urine: Secondary | ICD-10-CM | POA: Diagnosis not present

## 2021-05-28 DIAGNOSIS — K5901 Slow transit constipation: Secondary | ICD-10-CM | POA: Diagnosis not present

## 2021-05-28 DIAGNOSIS — N8111 Cystocele, midline: Secondary | ICD-10-CM | POA: Diagnosis not present

## 2021-05-28 DIAGNOSIS — R82998 Other abnormal findings in urine: Secondary | ICD-10-CM | POA: Diagnosis not present

## 2021-06-16 DIAGNOSIS — Z1231 Encounter for screening mammogram for malignant neoplasm of breast: Secondary | ICD-10-CM | POA: Diagnosis not present

## 2021-06-16 LAB — HM MAMMOGRAPHY

## 2021-06-18 ENCOUNTER — Other Ambulatory Visit: Payer: Self-pay | Admitting: Allergy & Immunology

## 2021-07-09 DIAGNOSIS — K5900 Constipation, unspecified: Secondary | ICD-10-CM | POA: Diagnosis not present

## 2021-07-09 DIAGNOSIS — R82998 Other abnormal findings in urine: Secondary | ICD-10-CM | POA: Diagnosis not present

## 2021-07-09 DIAGNOSIS — N9489 Other specified conditions associated with female genital organs and menstrual cycle: Secondary | ICD-10-CM | POA: Diagnosis not present

## 2021-07-09 DIAGNOSIS — N398 Other specified disorders of urinary system: Secondary | ICD-10-CM | POA: Diagnosis not present

## 2021-07-09 DIAGNOSIS — R338 Other retention of urine: Secondary | ICD-10-CM | POA: Diagnosis not present

## 2021-07-09 DIAGNOSIS — N362 Urethral caruncle: Secondary | ICD-10-CM | POA: Diagnosis not present

## 2021-07-17 ENCOUNTER — Other Ambulatory Visit: Payer: Self-pay | Admitting: Family Medicine

## 2021-07-22 ENCOUNTER — Encounter: Payer: Self-pay | Admitting: Gastroenterology

## 2021-07-22 ENCOUNTER — Ambulatory Visit (INDEPENDENT_AMBULATORY_CARE_PROVIDER_SITE_OTHER): Payer: Medicare Other | Admitting: Gastroenterology

## 2021-07-22 VITALS — BP 142/84 | HR 88 | Ht 65.0 in | Wt 209.0 lb

## 2021-07-22 DIAGNOSIS — K5900 Constipation, unspecified: Secondary | ICD-10-CM

## 2021-07-22 DIAGNOSIS — Z1212 Encounter for screening for malignant neoplasm of rectum: Secondary | ICD-10-CM | POA: Diagnosis not present

## 2021-07-22 DIAGNOSIS — Z1211 Encounter for screening for malignant neoplasm of colon: Secondary | ICD-10-CM

## 2021-07-22 NOTE — Progress Notes (Signed)
HPI : Brooke Schneider is a very pleasant 71 year old female with a history of asthma, arthritis and depression who I saw originally in September 2022.  She has chronic constipation and was taking senna.  I recommended that she start taking Metamucil on a daily basis in addition to her senna.  We also scheduled her for a routine screening colonoscopy at that time.  The patient ended up canceling the colonoscopy because she was having a lot of urogynecology issues.  She has been working with a urologist for symptoms of urinary incontinence.  She has a cystoscopy scheduled for next month.  She is ready to reschedule her colonoscopy, and actually already has one scheduled for March 1.  She states that her urologist recommended she take Colace instead of the Metamucil, but she does not think it is helping very much.  She currently has bowel movement on average 2-3 times per week.  Stools are typically formed, sometimes hard.  She has been having some vague mild left upper quadrant pain, without nausea or vomiting.  Pain is very mild and is difficult to fully characterize.    Past Medical History:  Diagnosis Date   Allergic rhinitis    Arthritis    Asthma    Depression    Goiter    Goiter    Headache(784.0)    Sleep apnea    Colonoscopy, 2011: Dr. Loreta Ave Isolated right-sided diverticulum Mucosal bleb at 60 cm (?  Pneumatosis?) Otherwise normal  Colonoscopy, 2005: Dr. Jeral Fruit sigmoid polyp 2 mucosal blebs  Past Surgical History:  Procedure Laterality Date   ABDOMINAL HYSTERECTOMY     CHOLECYSTECTOMY N/A 11/02/2013   Procedure: LAPAROSCOPIC CHOLECYSTECTOMY ;  Surgeon: Cherylynn Ridges, MD;  Location: [facility] OR;  Service: General;  Laterality: N/A;   KNEE SURGERY     THYROIDECTOMY     Family History  Problem Relation Age of Onset   Allergies Mother    Dementia Mother    ALS Father    Prostate cancer Father    Social History   Tobacco Use   Smoking status: Never    Passive exposure: Yes    Smokeless tobacco: Never   Tobacco comments:    husband smoking cigars  Vaping Use   Vaping Use: Never used  Substance Use Topics   Alcohol use: Yes    Comment: occ   Drug use: Never   Current Outpatient Medications  Medication Sig Dispense Refill   albuterol (VENTOLIN HFA) 108 (90 Base) MCG/ACT inhaler Inhale two puffs every 4-6 hours if needed for cough or wheeze 18 g 2   bismuth subsalicylate (PEPTO BISMOL) 262 MG/15ML suspension Take 30 mLs by mouth every 6 (six) hours as needed.     buPROPion (WELLBUTRIN XL) 150 MG 24 hr tablet Take 1 tablet (150 mg total) by mouth daily. 90 tablet 3   Calcium Carbonate-Vitamin D 600-400 MG-UNIT tablet Take 1 tablet by mouth 2 (two) times daily.     clotrimazole (LOTRIMIN) 1 % cream Apply topically.     desonide (DESOWEN) 0.05 % cream APPLY TO AFFECTED AREA TWICE A DAY. 30 g 0   dicyclomine (BENTYL) 20 MG tablet TAKE 1 TABLET FOUR TIMES A DAY BEFORE MEALS AND AT BEDTIME 90 tablet 2   DIVIGEL 0.25 MG/0.25GM GEL Apply 1 application topically daily. Thigh area 1 each 5   fluticasone (FLONASE) 50 MCG/ACT nasal spray One spray each nostril daily as needed 48 g 1   Fluticasone-Salmeterol (ADVAIR DISKUS) 500-50 MCG/DOSE AEPB  Inhale 1 puff into the lungs 2 (two) times daily. 60 each 5   fluticasone-salmeterol (ADVAIR) 500-50 MCG/ACT AEPB Inhale 1 puff into the lungs daily. 60 each 2   ketoconazole (NIZORAL) 2 % shampoo Apply 1 application topically 2 (two) times a week. To affected areas on skin 120 mL 2   levothyroxine (SYNTHROID) 125 MCG tablet Take 1 tablet (125 mcg total) by mouth daily. 90 tablet 3   montelukast (SINGULAIR) 10 MG tablet TAKE (1) TABLET DAILY AT BEDTIME. 30 tablet 0   Multiple Vitamins-Minerals (MULTIVITAMIN WITH MINERALS) tablet Take 1 tablet by mouth daily.     Olopatadine HCl (PATADAY) 0.7 % SOLN Apply 1 drop to eye daily. 7.5 mL 1   pantoprazole (PROTONIX) 40 MG tablet Take 1 tablet (40 mg total) by mouth 2 (two) times daily. 180  tablet 3   rizatriptan (MAXALT) 10 MG tablet Take 1 tablet (10 mg total) by mouth as needed for migraine. Reported on 08/12/2015 18 tablet 2   Spacer/Aero-Holding Chambers (AEROCHAMBER PLUS) inhaler Use as instructed 1 each 2   SUTAB [phone] MG TABS Take by mouth.     topiramate (TOPAMAX) 100 MG tablet TAKE 1/2 TABLET IN THE MORNING AND TAKE 1 TABLET AT BEDTIME. 135 tablet 3   zolpidem (AMBIEN) 10 MG tablet Take 1 tablet (10 mg total) by mouth at bedtime. 90 tablet 0   No current facility-administered medications for this visit.   Allergies  Allergen Reactions   Sulfa Antibiotics    Sulfamethoxazole-Trimethoprim Swelling   Metronidazole Itching and Rash     Review of Systems: All systems reviewed and negative except where noted in HPI.    No results found.  Physical Exam: BP (!) 142/84    Pulse 88    Ht 5\' 5"  (1.651 m)    Wt 209 lb (94.8 kg)    SpO2 98%    BMI 34.78 kg/m  Constitutional: Pleasant,well-developed, African-American female in no acute distress. HEENT: Normocephalic and atraumatic. Conjunctivae are normal. No scleral icterus. Cardiovascular: Normal rate, regular rhythm.  Pulmonary/chest: Effort normal and breath sounds normal. No wheezing, rales or rhonchi. Abdominal: Soft, nondistended, nontender. Bowel sounds active throughout. There are no masses palpable. No hepatomegaly. Extremities: no edema Neurological: Alert and oriented to person place and time. Skin: Skin is warm and dry. No rashes noted. Psychiatric: Normal mood and affect. Behavior is normal.  CBC    Component Value Date/Time   WBC 5.1 04/15/2020 1435   WBC 5.1 03/14/2019 1006   RBC 4.74 04/15/2020 1435   RBC 4.62 03/14/2019 1006   HGB 14.4 04/15/2020 1435   HCT 42.4 04/15/2020 1435   PLT 242 03/14/2019 1006   MCV 90 04/15/2020 1435   MCH 30.4 04/15/2020 1435   MCH 31.4 03/14/2019 1006   MCHC 34.0 04/15/2020 1435   MCHC 33.6 03/14/2019 1006   RDW 13.4 04/15/2020 1435   LYMPHSABS 2.8  04/15/2020 1435   MONOABS 384 09/09/2016 1020   EOSABS 0.2 04/15/2020 1435   BASOSABS 0.0 04/15/2020 1435    CMP     Component Value Date/Time   NA 140 03/14/2019 1006   K 4.0 03/14/2019 1006   CL 107 03/14/2019 1006   CO2 27 03/14/2019 1006   GLUCOSE 88 03/14/2019 1006   BUN 11 03/14/2019 1006   CREATININE 0.90 03/14/2019 1006   CALCIUM 9.4 03/14/2019 1006   PROT 7.0 03/14/2019 1006   ALBUMIN 3.9 09/09/2016 1020   AST 15 03/14/2019 1006   ALT 19  03/14/2019 1006   ALKPHOS 51 09/09/2016 1020   BILITOT 0.3 03/14/2019 1006   GFRNONAA 62 09/09/2016 1020   GFRAA 72 09/09/2016 1020     ASSESSMENT AND PLAN: 71 year old female with chronic constipation, still due for screening colonoscopy after having to cancel her previously scheduled colonoscopy.  We discussed the various types of laxatives available to treat chronic constipation, their mechanisms of actions.  After reviewing various bulk, osmotic and stimulant laxatives, the patient decided she went to go back to the Metamucil and take the senna as needed.  I also suggest she try incorporating kiwi fruit and prunes into her diet which have been shown to be helpful for constipation.  Constipation - Metamucil and Senna  CRC screening -Already scheduled for colonoscopy March 1  Kassia Demarinis E. Tomasa Rand, MD [facility]  CC:  Ardith Dark, MD

## 2021-07-22 NOTE — Patient Instructions (Signed)
Start Metamucil 2 teaspoons in 8 ounces of liquid daily.  May use Senna as needed.  You have been scheduled for a colonoscopy. Please follow written instructions given to you at your visit today.  Please pick up your prep supplies at the pharmacy within the next 1-3 days. If you use inhalers (even only as needed), please bring them with you on the day of your procedure.  If you are age 71 or older, your body mass index should be between 23-30. Your Body mass index is 34.78 kg/m. If this is out of the aforementioned range listed, please consider follow up with your Primary Care Provider.  ________________________________________________________  The [facility] providers would like to encourage you to use [facility] to communicate with providers for non-urgent requests or questions.  Due to long hold times on the telephone, sending your provider a message by [facility] may be a faster and more efficient way to get a response.  Please allow 48 business hours for a response.  Please remember that this is for non-urgent requests.  _______________________________________________________

## 2021-07-30 ENCOUNTER — Telehealth: Payer: Self-pay | Admitting: Gastroenterology

## 2021-07-30 DIAGNOSIS — Z1212 Encounter for screening for malignant neoplasm of rectum: Secondary | ICD-10-CM

## 2021-07-30 DIAGNOSIS — Z1211 Encounter for screening for malignant neoplasm of colon: Secondary | ICD-10-CM

## 2021-07-30 MED ORDER — SUTAB 1479-225-188 MG PO TABS: 1.0000 | ORAL_TABLET | ORAL | 0 refills | Status: DC

## 2021-07-30 NOTE — Telephone Encounter (Signed)
Sent sutab to pt's pharmacy per request.

## 2021-07-30 NOTE — Telephone Encounter (Signed)
Patient called an stated that pharmacy does not have her prep medication. Please advise.

## 2021-08-05 ENCOUNTER — Ambulatory Visit (AMBULATORY_SURGERY_CENTER): Payer: Medicare Other | Admitting: Gastroenterology

## 2021-08-05 ENCOUNTER — Other Ambulatory Visit: Payer: Self-pay

## 2021-08-05 ENCOUNTER — Encounter: Payer: Self-pay | Admitting: Gastroenterology

## 2021-08-05 VITALS — BP 145/85 | HR 73 | Temp 97.5°F | Resp 13 | Ht 65.0 in | Wt 209.0 lb

## 2021-08-05 DIAGNOSIS — Z1212 Encounter for screening for malignant neoplasm of rectum: Secondary | ICD-10-CM | POA: Diagnosis not present

## 2021-08-05 DIAGNOSIS — Z1211 Encounter for screening for malignant neoplasm of colon: Secondary | ICD-10-CM | POA: Diagnosis not present

## 2021-08-05 MED ORDER — SODIUM CHLORIDE 0.9 % IV SOLN: 500.0000 mL | Freq: Once | INTRAVENOUS | Status: DC

## 2021-08-05 NOTE — Progress Notes (Signed)
Pt c/o abdominal cramping.  She has not passed flatus.  I checked pt's allergies.  Levsin 0.125 sublingual given.  Continued to encourage pt to pass flatus.  Pt rolled to RLL position.  Pt still did not pass flatus.  I explained hand knee position was the next option.  Pt said she could not get on right knee d/t rt knee replacement.  I gained explained she should not be a lady, but let the flatus go.  She finally did pass flatus.   ?

## 2021-08-05 NOTE — Progress Notes (Signed)
History and Physical Interval Note: ? ?08/05/2021 ?9:42 AM ? ?Brooke Schneider  has presented today for endoscopic procedure(s), with the diagnosis of  ?Encounter Diagnosis  ?Name Primary?  ? Screening for colorectal cancer Yes  ?Marland Kitchen  The various methods of evaluation and treatment have been discussed with the patient and/or family. After consideration of risks, benefits and other options for treatment, the patient has consented to  the endoscopic procedure(s). ? ? The patient's history has been reviewed, patient examined, no change in status, stable for endoscopic procedure(s).  I have reviewed the patient's chart and labs.  Questions were answered to the patient's satisfaction.   ? ? ?Darriel Utter E. Tomasa Rand, MD ?[facility] ? ? ?

## 2021-08-05 NOTE — Progress Notes (Signed)
A and O x3. Report to RN. Tolerated MAC anesthesia well.  ?

## 2021-08-05 NOTE — Patient Instructions (Addendum)
Handout was given to your care partner on diverticulosis. ?You may resume your current medications today. ?Per Dr. Tomasa Rand, no further screening coloscopy. ?Please call if any questions or concerns. ?  ? ? ? ?YOU HAD AN ENDOSCOPIC PROCEDURE TODAY AT THE [facility]:   Refer to the procedure report that was given to you for any specific questions about what was found during the examination.  If the procedure report does not answer your questions, please call your gastroenterologist to clarify.  If you requested that your care partner not be given the details of your procedure findings, then the procedure report has been included in a sealed envelope for you to review at your convenience later. ? ?YOU SHOULD EXPECT: Some feelings of bloating in the abdomen. Passage of more gas than usual.  Walking can help get rid of the air that was put into your GI tract during the procedure and reduce the bloating. If you had a lower endoscopy (such as a colonoscopy or flexible sigmoidoscopy) you may notice spotting of blood in your stool or on the toilet paper. If you underwent a bowel prep for your procedure, you may not have a normal bowel movement for a few days. ? ?Please Note:  You might notice some irritation and congestion in your nose or some drainage.  This is from the oxygen used during your procedure.  There is no need for concern and it should clear up in a day or so. ? ?SYMPTOMS TO REPORT IMMEDIATELY: ? ?Following lower endoscopy (colonoscopy or flexible sigmoidoscopy): ? Excessive amounts of blood in the stool ? Significant tenderness or worsening of abdominal pains ? Swelling of the abdomen that is new, acute ? Fever of 100?F or higher ? ? ?For urgent or emergent issues, a gastroenterologist can be reached at any hour by calling [phone]. ?Do not use MyChart messaging for urgent concerns.  ? ? ?DIET:  We do recommend a small meal at first, but then you may proceed to your regular diet.  Drink  plenty of fluids but you should avoid alcoholic beverages for 24 hours. ? ?ACTIVITY:  You should plan to take it easy for the rest of today and you should NOT DRIVE or use heavy machinery until tomorrow (because of the sedation medicines used during the test).   ? ?FOLLOW UP: ?Our staff will call the number listed on your records 48-72 hours following your procedure to check on you and address any questions or concerns that you may have regarding the information given to you following your procedure. If we do not reach you, we will leave a message.  We will attempt to reach you two times.  During this call, we will ask if you have developed any symptoms of COVID 19. If you develop any symptoms (ie: fever, flu-like symptoms, shortness of breath, cough etc.) before then, please call [phone].  If you test positive for Covid 19 in the 2 weeks post procedure, please call and report this information to [place].   ? ?If any biopsies were taken you will be contacted by phone or by letter within the next 1-3 weeks.  Please call us at [phone] if you have not heard about the biopsies in 3 weeks.  ? ? ?SIGNATURES/CONFIDENTIALITY: ?You and/or your care partner have signed paperwork which will be entered into your electronic medical record.  These signatures attest to the fact that that the information above on your After Visit Summary has been reviewed and is understood.  Full responsibility of the confidentiality of this discharge information lies with you and/or your care-partner.  ?

## 2021-08-05 NOTE — Op Note (Signed)
[facility] ?Patient Name: Brooke Schneider ?Procedure Date: 08/05/2021 9:30 AM ?MRN: [id number] ?Endoscopist: Cherylin Waguespack E. Candis Schatz , MD ?Age: 71 ?Referring MD:  ?Date of Birth: December 28, 1950 ?Gender: Female ?Account #: [id number] ?Procedure:                Colonoscopy ?Indications:              Screening for colorectal malignant neoplasm ?Medicines:                Monitored Anesthesia Care ?Procedure:                Pre-Anesthesia Assessment: ?                          - Prior to the procedure, a History and Physical  ?                          was performed, and patient medications and  ?                          allergies were reviewed. The patient's tolerance of  ?                          previous anesthesia was also reviewed. The risks  ?                          and benefits of the procedure and the sedation  ?                          options and risks were discussed with the patient.  ?                          All questions were answered, and informed consent  ?                          was obtained. Prior Anticoagulants: The patient has  ?                          taken no previous anticoagulant or antiplatelet  ?                          agents. ASA Grade Assessment: II - A patient with  ?                          mild systemic disease. After reviewing the risks  ?                          and benefits, the patient was deemed in  ?                          satisfactory condition to undergo the procedure. ?                          After obtaining informed consent, the colonoscope  ?  was passed under direct vision. Throughout the  ?                          procedure, the patient's blood pressure, pulse, and  ?                          oxygen saturations were monitored continuously. The  ?                          PCF-HQ190L Colonoscope was introduced through the  ?                          anus and advanced to the the cecum, identified by  ?                          appendiceal  orifice and ileocecal valve. The  ?                          colonoscopy was performed with difficulty due to  ?                          significant looping and a tortuous colon.  ?                          Successful completion of the procedure was aided by  ?                          using manual pressure. The patient tolerated the  ?                          procedure well. The quality of the bowel  ?                          preparation was good. The ileocecal valve,  ?                          appendiceal orifice, and rectum were photographed.  ?                          The bowel preparation used was Sutab via split dose  ?                          instruction. ?Scope In: 9:55:37 AM ?Scope Out: 10:24:04 AM ?Scope Withdrawal Time: 0 hours 13 minutes 15 seconds  ?Total Procedure Duration: 0 hours 28 minutes 27 seconds  ?Findings:                 The perianal and digital rectal examinations were  ?                          normal. Pertinent negatives include normal  ?                          sphincter tone and no palpable rectal lesions. ?  A few small and large-mouthed diverticula were  ?                          found in the sigmoid colon and ascending colon. ?                          The exam was otherwise normal throughout the  ?                          examined colon. ?                          The retroflexed view of the distal rectum and anal  ?                          verge was normal and showed no anal or rectal  ?                          abnormalities. ?Complications:            No immediate complications. ?Estimated Blood Loss:     Estimated blood loss: none. ?Impression:               - Diverticulosis in the sigmoid colon and in the  ?                          ascending colon. ?                          - The distal rectum and anal verge are normal on  ?                          retroflexion view. ?                          - No specimens collected. ?Recommendation:            - Patient has a contact number available for  ?                          emergencies. The signs and symptoms of potential  ?                          delayed complications were discussed with the  ?                          patient. Return to normal activities tomorrow.  ?                          Written discharge instructions were provided to the  ?                          patient. ?                          - Resume previous diet. ?                          -  Continue present medications. ?                          - Given patient's age and absence of polyps, would  ?                          recommend against further colon cancer screening ?Jaedin Trumbo E. Candis Schatz, MD ?08/05/2021 10:29:36 AM ?This report has been signed electronically. ?

## 2021-08-07 ENCOUNTER — Telehealth: Payer: Self-pay | Admitting: *Deleted

## 2021-08-07 NOTE — Telephone Encounter (Signed)
?  Follow up Call- ? ?Call back number 08/05/2021  ?Post procedure Call Back phone  # [phone]  ?Permission to leave phone message Yes  ?Some recent data might be hidden  ?  ? ?Patient questions: ? ?Message left to call us if necessary. ?

## 2021-08-07 NOTE — Telephone Encounter (Signed)
Returned call to advise [place] that she is doing well. Is taking it easy and getting some rest.  ?

## 2021-08-07 NOTE — Telephone Encounter (Signed)
?  Follow up Call- ? ?Call back number 08/05/2021  ?Post procedure Call Back phone  # [phone]  ?Permission to leave phone message Yes  ?Some recent data might be hidden  ?  ? ?Patient questions: ? ?Message left to call us if necessary. ?

## 2021-08-12 DIAGNOSIS — Z1272 Encounter for screening for malignant neoplasm of vagina: Secondary | ICD-10-CM | POA: Diagnosis not present

## 2021-08-12 DIAGNOSIS — Z6834 Body mass index (BMI) 34.0-34.9, adult: Secondary | ICD-10-CM | POA: Diagnosis not present

## 2021-08-12 DIAGNOSIS — Z779 Other contact with and (suspected) exposures hazardous to health: Secondary | ICD-10-CM | POA: Diagnosis not present

## 2021-08-12 DIAGNOSIS — N819 Female genital prolapse, unspecified: Secondary | ICD-10-CM | POA: Diagnosis not present

## 2021-08-12 DIAGNOSIS — N952 Postmenopausal atrophic vaginitis: Secondary | ICD-10-CM | POA: Diagnosis not present

## 2021-08-12 LAB — RESULTS CONSOLE HPV: CHL HPV: NEGATIVE

## 2021-08-12 LAB — HM PAP SMEAR

## 2021-08-31 ENCOUNTER — Ambulatory Visit (INDEPENDENT_AMBULATORY_CARE_PROVIDER_SITE_OTHER): Payer: Medicare Other | Admitting: Family Medicine

## 2021-08-31 ENCOUNTER — Telehealth: Payer: Self-pay | Admitting: Family Medicine

## 2021-08-31 VITALS — BP 132/78 | HR 82 | Temp 98.4°F | Ht 65.0 in | Wt 209.8 lb

## 2021-08-31 DIAGNOSIS — R739 Hyperglycemia, unspecified: Secondary | ICD-10-CM

## 2021-08-31 DIAGNOSIS — F331 Major depressive disorder, recurrent, moderate: Secondary | ICD-10-CM

## 2021-08-31 DIAGNOSIS — F5101 Primary insomnia: Secondary | ICD-10-CM | POA: Diagnosis not present

## 2021-08-31 DIAGNOSIS — K219 Gastro-esophageal reflux disease without esophagitis: Secondary | ICD-10-CM | POA: Diagnosis not present

## 2021-08-31 DIAGNOSIS — G43909 Migraine, unspecified, not intractable, without status migrainosus: Secondary | ICD-10-CM | POA: Diagnosis not present

## 2021-08-31 DIAGNOSIS — M1711 Unilateral primary osteoarthritis, right knee: Secondary | ICD-10-CM

## 2021-08-31 DIAGNOSIS — E785 Hyperlipidemia, unspecified: Secondary | ICD-10-CM

## 2021-08-31 DIAGNOSIS — K582 Mixed irritable bowel syndrome: Secondary | ICD-10-CM

## 2021-08-31 DIAGNOSIS — E039 Hypothyroidism, unspecified: Secondary | ICD-10-CM | POA: Diagnosis not present

## 2021-08-31 DIAGNOSIS — J4540 Moderate persistent asthma, uncomplicated: Secondary | ICD-10-CM | POA: Diagnosis not present

## 2021-08-31 DIAGNOSIS — T7840XS Allergy, unspecified, sequela: Secondary | ICD-10-CM | POA: Diagnosis not present

## 2021-08-31 LAB — CBC
HCT: 42.4 % (ref 36.0–46.0)
Hemoglobin: 14.2 g/dL (ref 12.0–15.0)
MCHC: 33.5 g/dL (ref 30.0–36.0)
MCV: 92.4 fl (ref 78.0–100.0)
Platelets: 213 10*3/uL (ref 150.0–400.0)
RBC: 4.59 Mil/uL (ref 3.87–5.11)
RDW: 14.2 % (ref 11.5–15.5)
WBC: 5.3 10*3/uL (ref 4.0–10.5)

## 2021-08-31 LAB — LIPID PANEL
Cholesterol: 180 mg/dL (ref 0–200)
HDL: 54.6 mg/dL (ref >39.00)
LDL Cholesterol: 107 mg/dL — ABNORMAL HIGH (ref 0–99)
NonHDL: 125.71
Total CHOL/HDL Ratio: 3
Triglycerides: 92 mg/dL (ref 0.0–149.0)
VLDL: 18.4 mg/dL (ref 0.0–40.0)

## 2021-08-31 LAB — COMPREHENSIVE METABOLIC PANEL
ALT: 19 U/L (ref 0–35)
AST: 18 U/L (ref 0–37)
Albumin: 4.3 g/dL (ref 3.5–5.2)
Alkaline Phosphatase: 52 U/L (ref 39–117)
BUN: 12 mg/dL (ref 6–23)
CO2: 25 mEq/L (ref 19–32)
Calcium: 9.5 mg/dL (ref 8.4–10.5)
Chloride: 104 mEq/L (ref 96–112)
Creatinine, Ser: 0.93 mg/dL (ref 0.40–1.20)
GFR: 62.1 mL/min (ref >60.00)
Glucose, Bld: 113 mg/dL — ABNORMAL HIGH (ref 70–99)
Potassium: 3.4 mEq/L — ABNORMAL LOW (ref 3.5–5.1)
Sodium: 138 mEq/L (ref 135–145)
Total Bilirubin: 0.5 mg/dL (ref 0.2–1.2)
Total Protein: 7.7 g/dL (ref 6.0–8.3)

## 2021-08-31 LAB — HEMOGLOBIN A1C: Hgb A1c MFr Bld: 6 % (ref 4.6–6.5)

## 2021-08-31 MED ORDER — RIZATRIPTAN BENZOATE 10 MG PO TABS
10.0000 mg | ORAL_TABLET | ORAL | 2 refills | Status: DC | PRN
Start: 2021-08-31 — End: 2021-12-21

## 2021-08-31 MED ORDER — LEVOTHYROXINE SODIUM 125 MCG PO TABS: 125.0000 ug | ORAL_TABLET | Freq: Every day | ORAL | 3 refills | Status: DC

## 2021-08-31 MED ORDER — FLUTICASONE-SALMETEROL 500-50 MCG/ACT IN AEPB: 1.0000 | INHALATION_SPRAY | Freq: Every day | RESPIRATORY_TRACT | 2 refills | Status: DC

## 2021-08-31 NOTE — Assessment & Plan Note (Signed)
Follows with GI. She is on protonix 40mg  twice daily. Check labs.  ?

## 2021-08-31 NOTE — Assessment & Plan Note (Signed)
On synthroid daily. Check TSH today.  ?

## 2021-08-31 NOTE — Assessment & Plan Note (Signed)
Follows with allergist. She is on claritin and singulair. She will follow up with her allergist soon.  ?

## 2021-08-31 NOTE — Progress Notes (Signed)
? ?  Brooke Schneider is a 71 y.o. female who presents today for an office visit. ? ?Assessment/Plan:  ?Chronic Problems Addressed Today: ?IBS (irritable bowel syndrome) ?Continue management per GI. She is currently on bentyl as needed.  ? ?Insomnia ?Doing well on ambien as needed. Does not need refill today.  ? ?Asthma, allergic ?Follows with allergist. On advair and singulair. Takes albuterol as needed. She will follow up with allergist soon.  ? ?Allergies ?Follows with allergist. She is on claritin and singulair. She will follow up with her allergist soon.  ? ?Hypothyroidism ?On synthroid daily. Check TSH today.  ? ?GERD (gastroesophageal reflux disease) ?Follows with GI. She is on protonix 40mg  twice daily. Check labs.  ? ?OA (osteoarthritis) of knee s/p TKA 2021 ?Symptoms are still not controlled. Will place referral to orthopedics.  ? ?MDD (major depressive disorder) ?Doing well on wellbutrin 150mg  daily.  ? ?Migraine ?She is on topiramate 50mg  in the morning and 100mg  at night. Will refill maxalt.  ? ? ?  ?Subjective:  ?HPI: ? ?See A/p for status of chronic conditions. She has not acute concerns today.   ? ?   ?  ?Objective:  ?Physical Exam: ?BP 132/78 (BP Location: Right Arm)   Pulse 82   Temp 98.4 ?F (36.9 ?C) (Temporal)   Ht 5\' 5"  (1.651 m)   Wt 209 lb 12.8 oz (95.2 kg)   SpO2 97%   BMI 34.91 kg/m?   ?Gen: No acute distress, resting comfortably ?CV: Regular rate and rhythm with no murmurs appreciated ?Pulm: Normal work of breathing, clear to auscultation bilaterally with no crackles, wheezes, or rhonchi ?Neuro: Grossly normal, moves all extremities ?Psych: Normal affect and thought content ? ?   ? ?2022. , MD ?08/31/2021 1:40 PM  ?

## 2021-08-31 NOTE — Patient Instructions (Signed)
It was very nice to see you today! ? ?We will check blood work today.  ? ?I will refer you to see the orthopedist for your knee. ? ?I will refill your medications today. ? ?Come back in 6 months. Please come back sooner if needed.  ? ?Take care, ?Dr Jimmey Ralph ? ?PLEASE NOTE: ? ?If you had any lab tests please let us know if you have not heard back within a few days. You may see your results on mychart before we have a chance to review them but we will give you a call once they are reviewed by [place]. If we ordered any referrals today, please let us know if you have not heard from their office within the next week.  ? ?Please try these tips to maintain a healthy lifestyle: ? ?Eat at least 3 REAL meals and 1-2 snacks per day.  Aim for no more than 5 hours between eating.  If you eat breakfast, please do so within one hour of getting up.  ? ?Each meal should contain half fruits/vegetables, one quarter protein, and one quarter carbs (no bigger than a computer mouse) ? ?Cut down on sweet beverages. This includes juice, soda, and sweet tea.  ? ?Drink at least 1 glass of water with each meal and aim for at least 8 glasses per day ? ?Exercise at least 150 minutes every week.   ?

## 2021-08-31 NOTE — Assessment & Plan Note (Signed)
Doing well on ambien as needed. Does not need refill today.  ?

## 2021-08-31 NOTE — Telephone Encounter (Signed)
Thank you, Dr Jimmey Ralph is aware and will see patient in office with precautions ?

## 2021-08-31 NOTE — Assessment & Plan Note (Signed)
Continue management per GI. She is currently on bentyl as needed.  ?

## 2021-08-31 NOTE — Telephone Encounter (Signed)
Just and FYI, pt was exposed to Covid over the weekend. She is not experiencing symptoms. I advised pt to wear a mask at all times during her appt.  ?

## 2021-08-31 NOTE — Assessment & Plan Note (Signed)
Symptoms are still not controlled. Will place referral to orthopedics.  ?

## 2021-08-31 NOTE — Assessment & Plan Note (Signed)
Doing well on wellbutrin 150mg  daily.  ?

## 2021-08-31 NOTE — Assessment & Plan Note (Signed)
She is on topiramate 50mg  in the morning and 100mg  at night. Will refill maxalt.  ?

## 2021-08-31 NOTE — Assessment & Plan Note (Signed)
Follows with allergist. On advair and singulair. Takes albuterol as needed. She will follow up with allergist soon.  ?

## 2021-09-01 LAB — TSH: TSH: 2.99 u[IU]/mL (ref 0.35–5.50)

## 2021-09-01 NOTE — Progress Notes (Signed)
Please inform patient of the following: ? ?Blood sugar and cholesterol are both borderline elevated but stable. Do not need to make any changes to her treatment plan at this time but she should continue to work on diet and exercise and we can recheck in a year. ? ?Katina Degree. Jimmey Ralph, MD ?09/01/2021 8:34 AM  ?

## 2021-09-03 DIAGNOSIS — N398 Other specified disorders of urinary system: Secondary | ICD-10-CM | POA: Diagnosis not present

## 2021-09-03 DIAGNOSIS — R338 Other retention of urine: Secondary | ICD-10-CM | POA: Diagnosis not present

## 2021-09-11 ENCOUNTER — Ambulatory Visit: Payer: Medicare Other

## 2021-09-15 ENCOUNTER — Ambulatory Visit: Payer: Medicare Other | Admitting: Orthopedic Surgery

## 2021-09-16 ENCOUNTER — Encounter: Payer: Self-pay | Admitting: Family Medicine

## 2021-09-18 ENCOUNTER — Ambulatory Visit: Payer: Medicare Other

## 2021-09-18 ENCOUNTER — Other Ambulatory Visit: Payer: Self-pay | Admitting: Family Medicine

## 2021-09-18 NOTE — Telephone Encounter (Signed)
Last visit: 08/31/21 ? ?Next visit: none ? ?Last filled: 06/03/21 ? ?Quantity: 90 ? ?

## 2021-10-02 ENCOUNTER — Other Ambulatory Visit: Payer: Self-pay | Admitting: Allergy & Immunology

## 2021-10-03 ENCOUNTER — Other Ambulatory Visit: Payer: Self-pay | Admitting: Allergy & Immunology

## 2021-10-15 ENCOUNTER — Ambulatory Visit (INDEPENDENT_AMBULATORY_CARE_PROVIDER_SITE_OTHER): Payer: Medicare Other | Admitting: Orthopedic Surgery

## 2021-10-15 ENCOUNTER — Ambulatory Visit (INDEPENDENT_AMBULATORY_CARE_PROVIDER_SITE_OTHER): Payer: Medicare Other

## 2021-10-15 DIAGNOSIS — G8929 Other chronic pain: Secondary | ICD-10-CM | POA: Diagnosis not present

## 2021-10-15 DIAGNOSIS — M25561 Pain in right knee: Secondary | ICD-10-CM

## 2021-10-17 ENCOUNTER — Encounter: Payer: Self-pay | Admitting: Orthopedic Surgery

## 2021-10-17 NOTE — Progress Notes (Signed)
? ?Office Visit Note ?  ?Patient: Brooke Schneider           ?Date of Birth: May 15, 1951           ?MRN: [id number] ?Visit Date: 10/15/2021 ?             ?Requested by: Ardith Dark, MD ?[phone] ?[place],  [place] [address] ?PCP: Ardith Dark, MD ? ?Chief Complaint  ?Patient presents with  ? Right Knee - Pain  ? ?71-year-old woman who is status post a right total knee arthroplasty by Dr. Marlyne Beards in 2006 or 2007 in [place].  Patient has had some swelling and is doing therapy at [facility]. ? ? ?HPI: ?Patient is a 30 ? ?Assessment & Plan: ?Visit Diagnoses:  ?1. Chronic pain of right knee   ? ? ?Plan: We will continue with physical therapy plan to follow-up in 4 weeks. ? ?May need to get a CT scan of the right knee to evaluate for loosening if she is not showing significant improvement with therapy. ? ?Follow-Up Instructions: Return in about 4 weeks (around 11/12/2021).  ? ?Ortho Exam ? ?Patient is alert, oriented, no adenopathy, well-dressed, normal affect, normal respiratory effort. ?Examination the total knee replacement has good alignment.  She has significant decreased range of motion from about 0 to 70 degrees there is no effusion no redness no cellulitis no signs of infection. ? ?Imaging: ?No results found. ?No images are attached to the encounter. ? ?Labs: ?Lab Results  ?Component Value Date  ? HGBA1C 6.0 08/31/2021  ? ESRSEDRATE 9 06/11/2014  ? LABORGA Normal Upper Respiratory Flora 07/21/2015  ? LABORGA No Beta Hemolytic Streptococci Isolated 07/21/2015  ? ? ? ?Lab Results  ?Component Value Date  ? ALBUMIN 4.3 08/31/2021  ? ALBUMIN 3.9 09/09/2016  ? ALBUMIN 4.1 10/17/2015  ? ? ?No results found for: MG ?No results found for: VD25OH ? ?No results found for: PREALBUMIN ? ?  Latest Ref Rng & Units 08/31/2021  ?  1:43 PM 04/15/2020  ?  2:35 PM 03/14/2019  ? 10:06 AM  ?CBC EXTENDED  ?WBC 4.0 - 10.5 K/uL 5.3   5.1   5.1    ?RBC 3.87 - 5.11 Mil/uL 4.59   4.74   4.62    ?Hemoglobin 12.0 - 15.0 g/dL 31.4   97.0    26.3    ?HCT 36.0 - 46.0 % 42.4   42.4   43.2    ?Platelets 150.0 - 400.0 K/uL 213.0    242    ?NEUT# 1.4 - 7.0 x10E3/uL  1.6   1,841    ?Lymph# 0.7 - 3.1 x10E3/uL  2.8   2,672    ? ? ? ?There is no height or weight on file to calculate BMI. ? ?Orders:  ?Orders Placed This Encounter  ?Procedures  ? XR Knee 1-2 Views Right  ? Ambulatory referral to Physical Therapy  ? ?No orders of the defined types were placed in this encounter. ? ? ? Procedures: ?No procedures performed ? ?Clinical Data: ?No additional findings. ? ?ROS: ? ?All other systems negative, except as noted in the HPI. ?Review of Systems ? ?Objective: ?Vital Signs: There were no vitals taken for this visit. ? ?Specialty Comments:  ?No specialty comments available. ? ?PMFS History: ?Patient Active Problem List  ? Diagnosis Date Noted  ? GERD (gastroesophageal reflux disease) 07/17/2019  ? OA (osteoarthritis) of knee s/p TKA 2021 04/13/2019  ? MDD (major depressive disorder) 09/13/2016  ? Tinea versicolor  07/06/2016  ? IBS (irritable bowel syndrome) 01/02/2016  ? Hypothyroidism 10/17/2015  ? Migraine 11/27/2012  ? Insomnia 07/21/2009  ? Allergies 01/19/2008  ? Asthma, allergic 01/19/2008  ? ?Past Medical History:  ?Diagnosis Date  ? Allergic rhinitis   ? Arthritis   ? Asthma   ? Depression   ? Goiter   ? Goiter   ? Headache(784.0)   ? Sleep apnea   ?  ?Family History  ?Problem Relation Age of Onset  ? Allergies Mother   ? Dementia Mother   ? ALS Father   ? Prostate cancer Father   ? Colon cancer Neg Hx   ? Esophageal cancer Neg Hx   ? Stomach cancer Neg Hx   ? Rectal cancer Neg Hx   ?  ?Past Surgical History:  ?Procedure Laterality Date  ? ABDOMINAL HYSTERECTOMY    ? CHOLECYSTECTOMY N/A 11/02/2013  ? Procedure: LAPAROSCOPIC CHOLECYSTECTOMY ;  Surgeon: Cherylynn Ridges, MD;  Location: [facility] OR;  Service: General;  Laterality: N/A;  ? KNEE SURGERY    ? THYROIDECTOMY    ? ?Social History  ? ?Occupational History  ? Occupation: housewife  ?Tobacco Use  ? Smoking  status: Never  ?  Passive exposure: Yes  ? Smokeless tobacco: Never  ? Tobacco comments:  ?  husband smoking cigars  ?Vaping Use  ? Vaping Use: Never used  ?Substance and Sexual Activity  ? Alcohol use: Yes  ?  Comment: occ  ? Drug use: Never  ? Sexual activity: Not Currently  ? ? ? ? ? ?

## 2021-10-20 ENCOUNTER — Ambulatory Visit (INDEPENDENT_AMBULATORY_CARE_PROVIDER_SITE_OTHER): Payer: Medicare Other | Admitting: Allergy & Immunology

## 2021-10-20 ENCOUNTER — Encounter: Payer: Self-pay | Admitting: Allergy & Immunology

## 2021-10-20 VITALS — BP 134/72 | HR 87 | Temp 98.0°F | Resp 12 | Ht 63.5 in | Wt 209.2 lb

## 2021-10-20 DIAGNOSIS — J3089 Other allergic rhinitis: Secondary | ICD-10-CM | POA: Diagnosis not present

## 2021-10-20 DIAGNOSIS — J4540 Moderate persistent asthma, uncomplicated: Secondary | ICD-10-CM | POA: Diagnosis not present

## 2021-10-20 DIAGNOSIS — K219 Gastro-esophageal reflux disease without esophagitis: Secondary | ICD-10-CM

## 2021-10-20 MED ORDER — AZELASTINE HCL 0.1 % NA SOLN: 1.0000 | Freq: Two times a day (BID) | NASAL | 12 refills | Status: DC | PRN

## 2021-10-20 MED ORDER — HYDROXYZINE HCL 25 MG PO TABS: 25.0000 mg | ORAL_TABLET | Freq: Two times a day (BID) | ORAL | 0 refills | Status: DC | PRN

## 2021-10-20 MED ORDER — FLUTICASONE-SALMETEROL 500-50 MCG/ACT IN AEPB: 1.0000 | INHALATION_SPRAY | Freq: Two times a day (BID) | RESPIRATORY_TRACT | 2 refills | Status: DC

## 2021-10-20 MED ORDER — FLUTICASONE PROPIONATE 50 MCG/ACT NA SUSP
NASAL | 1 refills | Status: DC
Start: 2021-10-20 — End: 2022-07-06

## 2021-10-20 NOTE — Progress Notes (Signed)
? ?FOLLOW UP ? ?Date of Service/Encounter:  10/20/21 ? ? ?Assessment:  ? ?Moderate persistent asthma, uncomplicated ?  ?LPRD (laryngopharyngeal reflux disease) ?  ?Allergic rhinitis ? ?Plan/Recommendations:  ? ?1. Moderate persistent asthma, uncomplicated ?- Lung testing looks good today. ?- We are going to increase the Advair back to one [puff twice daily.  ?- Daily controller medication(s): Advair 500/14mcg one puff twice daily ?- Prior to physical activity: albuterol 2 puffs 10-15 minutes before physical activity ?- Rescue medications: albuterol 4 puffs every 4-6 hours as needed ?- Asthma control goals:  ?* Full participation in all desired activities (may need albuterol before activity) ?* Albuterol use two time or less a week on average (not counting use with activity) ?* Cough interfering with sleep two time or less a month ?* Oral steroids no more than once a year ?* No hospitalizations ? ?2. LPRD (laryngopharyngeal reflux disease) ?- Continue with famotidine in the morning. ?- Continue with pantoprazole at night.  ? ?3. Allergic rhinitis ?- Continue with Flonase one spray per nostril daily as needed. ?- Continue with Astelin one spray per nostril twice daily AS NEEDED.  ?- Continue with Atarax 25mg  twice daily as needed (can cause sleepiness).  ? ?4. Return in about 6 months (around 04/22/2022).  ? ? ?Subjective:  ? ?Brooke Schneider is a 71 y.o. female presenting today for follow up of  ?Chief Complaint  ?Patient presents with  ? Asthma  ?  For a while she had to use the rescue inhaler. But right now she is doing alright.  ? Other  ?  Having a lot of itching. Gotten worse since she hasn't been able to use the Atarax.   ? Allergic Rhinitis   ?  Inflamed between the gnats and hornets and the grass and tree pollen  ? ? ?62 has a history of the following: ?Patient Active Problem List  ? Diagnosis Date Noted  ? GERD (gastroesophageal reflux disease) 07/17/2019  ? OA (osteoarthritis) of knee s/p  TKA 2021 04/13/2019  ? MDD (major depressive disorder) 09/13/2016  ? Tinea versicolor 07/06/2016  ? IBS (irritable bowel syndrome) 01/02/2016  ? Hypothyroidism 10/17/2015  ? Migraine 11/27/2012  ? Insomnia 07/21/2009  ? Allergies 01/19/2008  ? Asthma, allergic 01/19/2008  ? ? ?History obtained from: chart review and patient. ? ?Brooke Schneider is a 71 y.o. female presenting for a follow up visit.  She was last seen in September 2022.  At that time, her lung testing looked great.  We continued with Advair 500 mcg 1 puff once daily.  For her reflux, we continue with famotidine and pantoprazole.  Allergic rhinitis was controlled with Flonase.  We did add on Astelin to be used as needed and increased her Atarax to 25 mg twice daily. ? ?Since her last visit, she has mostly done well.  ? ?Asthma/Respiratory Symptom History: Breathing has been under fairly good control. She has been using her albuterol with the initiation of the spring season. She is using the Advair one puff once daily. She wants to go back to one puff twice daily. She has not been to the ED for her breathing at all. Overall she is doing fairly well.   ? ?Allergic Rhinitis Symptom History: She has been having more issues with the trees and grasses. She saw a swarm of gnats around and she know that she had yellow jackets. She was not feeling good when all of this was going on. She was having a lot  of intense itching with this.  She is out of the Atarax and needs a refill of this. She ran out of this and changed to [employer] and Claritin. She does not like the Astelin because it tastes bad.  ? ?Skin Symptom History: She denies any sleepiness with the Atarax.  She has been using this twice daily with good results. Her skin is much less pruritic.  ? ?She has been having right knee trouble. She is going to be starting physical therapy soon. She is s/p a right knee replacement in 2020.  ? ?Otherwise, there have been no changes to her past medical history, surgical  history, family history, or social history. ? ? ? ?Review of Systems  ?Constitutional: Negative.  Negative for chills, fever, malaise/fatigue and weight loss.  ?HENT: Negative.  Negative for congestion, ear discharge, ear pain and sinus pain.   ?Eyes:  Negative for pain, discharge and redness.  ?Respiratory:  Negative for cough, sputum production, shortness of breath and wheezing.   ?Cardiovascular: Negative.  Negative for chest pain and palpitations.  ?Gastrointestinal:  Negative for abdominal pain, constipation, diarrhea, heartburn, nausea and vomiting.  ?Skin: Negative.  Negative for itching and rash.  ?Neurological:  Negative for dizziness and headaches.  ?Endo/Heme/Allergies:  Negative for environmental allergies. Does not bruise/bleed easily.   ? ? ? ?Objective:  ? ?Blood pressure 134/72, pulse 87, temperature 98 ?F (36.7 ?C), temperature source Temporal, resp. rate 12, height 5' 3.5" (1.613 m), weight 209 lb 3.2 oz (94.9 kg), SpO2 97 %. ?Body mass index is 36.48 kg/m?. ? ? ? ?Physical Exam ?Vitals reviewed.  ?Constitutional:   ?   Appearance: She is well-developed.  ?HENT:  ?   Head: Normocephalic and atraumatic.  ?   Right Ear: Tympanic membrane, ear canal and external ear normal. No drainage, swelling or tenderness. Tympanic membrane is not injected, scarred, erythematous, retracted or bulging.  ?   Left Ear: Tympanic membrane, ear canal and external ear normal. No drainage, swelling or tenderness. Tympanic membrane is not injected, scarred, erythematous, retracted or bulging.  ?   Nose: No nasal deformity, septal deviation, mucosal edema or rhinorrhea.  ?   Right Turbinates: Enlarged, swollen and pale.  ?   Left Turbinates: Enlarged, swollen and pale.  ?   Right Sinus: No maxillary sinus tenderness or frontal sinus tenderness.  ?   Left Sinus: No maxillary sinus tenderness or frontal sinus tenderness.  ?   Mouth/Throat:  ?   Lips: Pink.  ?   Mouth: Mucous membranes are moist. Mucous membranes are not pale  and not dry.  ?   Pharynx: Uvula midline.  ?   Comments: Mild cobblestoning in the posterior oropharynx.  ?Eyes:  ?   General: Lids are normal. Allergic shiner present.     ?   Right eye: No discharge.     ?   Left eye: No discharge.  ?   Conjunctiva/sclera: Conjunctivae normal.  ?   Right eye: Right conjunctiva is not injected. No chemosis. ?   Left eye: Left conjunctiva is not injected. No chemosis. ?   Pupils: Pupils are equal, round, and reactive to light.  ?Cardiovascular:  ?   Rate and Rhythm: Normal rate and regular rhythm.  ?   Heart sounds: Normal heart sounds.  ?Pulmonary:  ?   Effort: Pulmonary effort is normal. No tachypnea, accessory muscle usage or respiratory distress.  ?   Breath sounds: Normal breath sounds. No wheezing, rhonchi or rales.  ?  Comments: Moving air well in all lung fields. No increased work of breathing noted.  ?Chest:  ?   Chest wall: No tenderness.  ?Abdominal:  ?   Tenderness: There is no abdominal tenderness. There is no guarding or rebound.  ?Lymphadenopathy:  ?   Head:  ?   Right side of head: No submandibular, tonsillar or occipital adenopathy.  ?   Left side of head: No submandibular, tonsillar or occipital adenopathy.  ?   Cervical: No cervical adenopathy.  ?Skin: ?   General: Skin is warm.  ?   Capillary Refill: Capillary refill takes less than 2 seconds.  ?   Coloration: Skin is not pale.  ?   Findings: No abrasion, erythema, petechiae or rash. Rash is not papular, urticarial or vesicular.  ?   Comments: She does have some lesions on her bilateral arms that are healed over.   ?Neurological:  ?   Mental Status: She is alert.  ?Psychiatric:     ?   Behavior: Behavior is cooperative.  ?  ? ?Diagnostic studies:   ? ?Spirometry: results normal (FEV1: 1.82/100%, FVC: 2.42/104%, FEV1/FVC: 75%).  ?  ?Spirometry consistent with normal pattern.  ? ? ?Allergy Studies: none ? ? ? ? ? ?  ?Malachi BondsJoel Kimbella Heisler, MD  ?[facility] ? ? ? ? ? ? ?

## 2021-10-20 NOTE — Patient Instructions (Addendum)
1. Moderate persistent asthma, uncomplicated ?- Lung testing looks good today. ?- We are going to increase the Advair back to one [puff twice daily.  ?- Daily controller medication(s): Advair 500/46mcg one puff twice daily ?- Prior to physical activity: albuterol 2 puffs 10-15 minutes before physical activity ?- Rescue medications: albuterol 4 puffs every 4-6 hours as needed ?- Asthma control goals:  ?* Full participation in all desired activities (may need albuterol before activity) ?* Albuterol use two time or less a week on average (not counting use with activity) ?* Cough interfering with sleep two time or less a month ?* Oral steroids no more than once a year ?* No hospitalizations ? ?2. LPRD (laryngopharyngeal reflux disease) ?- Continue with famotidine in the morning. ?- Continue with pantoprazole at night.  ? ?3. Allergic rhinitis ?- Continue with Flonase one spray per nostril daily as needed. ?- Continue with Astelin one spray per nostril twice daily AS NEEDED.  ?- Continue with Atarax 25mg  twice daily as needed (can cause sleepiness).  ? ?4. Return in about 6 months (around 04/22/2022).  ? ? ?Please inform 04/24/2022 of any Emergency Department visits, hospitalizations, or changes in symptoms. Call us before going to the ED for breathing or allergy symptoms since we might be able to fit you in for a sick visit. Feel free to contact us anytime with any questions, problems, or concerns. ? ?It was a pleasure to see you again today!  ? ?Websites that have reliable patient information: ?1. American Academy of Asthma, Allergy, and Immunology: www.aaaai.org ?2. Food Allergy Research and Education (FARE): foodallergy.org ?3. Mothers of Asthmatics: http://www.asthmacommunitynetwork.org ?4. [place] of Allergy, Asthma, and Immunology: [employer] ? ? ?COVID-19 Vaccine Information can be found at: [email or web address] For questions related to vaccine  distribution or appointments, please email [email or web address] or call [phone].  ? ? ? ??Like? [phone] on Facebook and Instagram for our latest updates!  ?  ? ? ?Make sure you are registered to vote! If you have moved or changed any of your contact information, you will need to get this updated before voting! ? ?In some cases, you MAY be able to register to vote online: [place] ? ? ? ? ?

## 2021-11-03 ENCOUNTER — Other Ambulatory Visit: Payer: Self-pay

## 2021-11-03 ENCOUNTER — Encounter (HOSPITAL_COMMUNITY): Payer: Self-pay

## 2021-11-03 ENCOUNTER — Emergency Department (HOSPITAL_COMMUNITY)
Admission: EM | Admit: 2021-11-03 | Discharge: 2021-11-03 | Disposition: A | Discharge status: Home / Self Care | Payer: Medicare Other | Attending: Emergency Medicine | Admitting: Emergency Medicine

## 2021-11-03 DIAGNOSIS — H9203 Otalgia, bilateral: Secondary | ICD-10-CM | POA: Diagnosis not present

## 2021-11-03 DIAGNOSIS — Z7951 Long term (current) use of inhaled steroids: Secondary | ICD-10-CM | POA: Insufficient documentation

## 2021-11-03 DIAGNOSIS — J4540 Moderate persistent asthma, uncomplicated: Secondary | ICD-10-CM | POA: Insufficient documentation

## 2021-11-03 DIAGNOSIS — Z20822 Contact with and (suspected) exposure to covid-19: Secondary | ICD-10-CM | POA: Insufficient documentation

## 2021-11-03 DIAGNOSIS — M791 Myalgia, unspecified site: Secondary | ICD-10-CM | POA: Diagnosis not present

## 2021-11-03 DIAGNOSIS — H6503 Acute serous otitis media, bilateral: Secondary | ICD-10-CM | POA: Diagnosis not present

## 2021-11-03 DIAGNOSIS — B9789 Other viral agents as the cause of diseases classified elsewhere: Secondary | ICD-10-CM | POA: Insufficient documentation

## 2021-11-03 DIAGNOSIS — J028 Acute pharyngitis due to other specified organisms: Secondary | ICD-10-CM | POA: Diagnosis not present

## 2021-11-03 DIAGNOSIS — R519 Headache, unspecified: Secondary | ICD-10-CM | POA: Diagnosis present

## 2021-11-03 DIAGNOSIS — J069 Acute upper respiratory infection, unspecified: Secondary | ICD-10-CM | POA: Insufficient documentation

## 2021-11-03 DIAGNOSIS — J029 Acute pharyngitis, unspecified: Secondary | ICD-10-CM

## 2021-11-03 LAB — GROUP A STREP BY PCR: Group A Strep by PCR: NOT DETECTED

## 2021-11-03 LAB — BASIC METABOLIC PANEL
Anion gap: 9 (ref 5–15)
BUN: 6 mg/dL — ABNORMAL LOW (ref 8–23)
CO2: 22 mmol/L (ref 22–32)
Calcium: 9.7 mg/dL (ref 8.9–10.3)
Chloride: 108 mmol/L (ref 98–111)
Creatinine, Ser: 0.86 mg/dL (ref 0.44–1.00)
GFR, Estimated: >60 mL/min (ref >60)
Glucose, Bld: 85 mg/dL (ref 70–99)
Potassium: 3.7 mmol/L (ref 3.5–5.1)
Sodium: 139 mmol/L (ref 135–145)

## 2021-11-03 LAB — CBC WITH DIFFERENTIAL/PLATELET
Abs Immature Granulocytes: 0.01 10*3/uL (ref 0.00–0.07)
Basophils Absolute: 0 10*3/uL (ref 0.0–0.1)
Basophils Relative: 1 %
Eosinophils Absolute: 0.2 10*3/uL (ref 0.0–0.5)
Eosinophils Relative: 4 %
HCT: 46.4 % — ABNORMAL HIGH (ref 36.0–46.0)
Hemoglobin: 14.9 g/dL (ref 12.0–15.0)
Immature Granulocytes: 0 %
Lymphocytes Relative: 45 %
Lymphs Abs: 2.1 10*3/uL (ref 0.7–4.0)
MCH: 30.3 pg (ref 26.0–34.0)
MCHC: 32.1 g/dL (ref 30.0–36.0)
MCV: 94.5 fL (ref 80.0–100.0)
Monocytes Absolute: 0.6 10*3/uL (ref 0.1–1.0)
Monocytes Relative: 13 %
Neutro Abs: 1.7 10*3/uL (ref 1.7–7.7)
Neutrophils Relative %: 37 %
Platelets: 240 10*3/uL (ref 150–400)
RBC: 4.91 MIL/uL (ref 3.87–5.11)
RDW: 14.8 % (ref 11.5–15.5)
WBC: 4.5 10*3/uL (ref 4.0–10.5)
nRBC: 0 % (ref 0.0–0.2)

## 2021-11-03 LAB — SARS CORONAVIRUS 2 BY RT PCR: SARS Coronavirus 2 by RT PCR: NEGATIVE

## 2021-11-03 MED ORDER — DEXAMETHASONE 4 MG PO TABS
10.0000 mg | ORAL_TABLET | Freq: Once | ORAL | Status: AC
  Administered 2021-11-03: 10 mg via ORAL
  Filled 2021-11-03: qty 3

## 2021-11-03 MED ORDER — ALBUTEROL SULFATE HFA 108 (90 BASE) MCG/ACT IN AERS
2.0000 | INHALATION_SPRAY | Freq: Once | RESPIRATORY_TRACT | Status: AC
  Administered 2021-11-03: 2 via RESPIRATORY_TRACT
  Filled 2021-11-03: qty 6.7

## 2021-11-03 MED ORDER — AMOXICILLIN-POT CLAVULANATE 875-125 MG PO TABS: 1.0000 | ORAL_TABLET | Freq: Two times a day (BID) | ORAL | 0 refills | Status: AC

## 2021-11-03 NOTE — ED Provider Notes (Signed)
[facility] EMERGENCY DEPARTMENT Provider Note   CSN: [id number] Arrival date & time: 11/03/21  1317     History  Chief Complaint  Patient presents with   Sore Throat   Generalized Body Aches    Brooke Schneider is a 71 y.o. female.  HPI     71yo female with history of goiter, OSA, moderate persistent asthma, laryngopharyngeal reflux disease, who presents with concern for body aches, decreased appetite, sore throat.  Worried about COVID, family had it in [place] of April, home testing was negative.  3 days of sore throat, body aches, some headache, ear fullness and pain especially on the right.  Temperatures 99.7.  Reports some wheezing, feels like her asthma, does not have albuterol inhaler now just advair--albuterol does tend to help when she is sick like this.  No nausea, vomiting. Is on sennakot.  No chest pain.  Mother is 25, has been taking care of her and going to [employer]. No known recent sick contacts.   Past Medical History:  Diagnosis Date   Allergic rhinitis    Arthritis    Asthma    Depression    Goiter    Goiter    Headache(784.0)    Sleep apnea      Home Medications Prior to Admission medications   Medication Sig Start Date End Date Taking? Authorizing Provider  amoxicillin-clavulanate (AUGMENTIN) 875-125 MG tablet Take 1 tablet by mouth every 12 (twelve) hours for 10 days. 11/03/21 11/13/21 Yes Alvira Monday, MD  albuterol (VENTOLIN HFA) 108 (90 Base) MCG/ACT inhaler Inhale two puffs every 4-6 hours if needed for cough or wheeze 07/08/20   Kozlow, Alvira Philips, MD  azelastine (ASTELIN) 0.1 % nasal spray Place 1 spray into both nostrils 2 (two) times daily as needed for rhinitis. Use in each nostril as directed 10/20/21   Alfonse Spruce, MD  bismuth subsalicylate (PEPTO BISMOL) 262 MG/15ML suspension Take 30 mLs by mouth every 6 (six) hours as needed.    [provider]  buPROPion (WELLBUTRIN XL) 150 MG 24 hr tablet  Take 1 tablet (150 mg total) by mouth daily. 03/03/21   Ardith Dark, MD  Calcium Carbonate-Vitamin D 600-400 MG-UNIT tablet Take 1 tablet by mouth 2 (two) times daily.    [provider]  clotrimazole (LOTRIMIN) 1 % cream Apply topically.    [provider]  desonide (DESOWEN) 0.05 % cream APPLY TO AFFECTED AREA TWICE A DAY. 07/17/21   Ardith Dark, MD  dicyclomine (BENTYL) 20 MG tablet TAKE 1 TABLET FOUR TIMES A DAY BEFORE MEALS AND AT BEDTIME 09/26/20   Ardith Dark, MD  DIVIGEL 0.25 MG/0.25GM GEL Apply 1 application topically daily. Thigh area 09/26/20   Ardith Dark, MD  fluticasone Aleda Grana) 50 MCG/ACT nasal spray One spray each nostril daily as needed 10/20/21   Alfonse Spruce, MD  fluticasone-salmeterol (ADVAIR) 500-50 MCG/ACT AEPB Inhale 1 puff into the lungs in the morning and at bedtime. 10/20/21   Alfonse Spruce, MD  hydrOXYzine (ATARAX) 25 MG tablet Take 1 tablet (25 mg total) by mouth 2 (two) times daily as needed. 10/20/21   Alfonse Spruce, MD  ketoconazole (NIZORAL) 2 % shampoo Apply 1 application topically 2 (two) times a week. To affected areas on skin 12/10/19   Salley Scarlet, MD  levothyroxine (SYNTHROID) 125 MCG tablet Take 1 tablet (125 mcg total) by mouth daily. 08/31/21   Ardith Dark, MD  montelukast (SINGULAIR) 10  MG tablet TAKE (1) TABLET DAILY AT BEDTIME. 05/20/21   Alfonse SpruceGallagher, Joel Louis, MD  Multiple Vitamins-Minerals (MULTIVITAMIN WITH MINERALS) tablet Take 1 tablet by mouth daily.    [provider]  Olopatadine HCl (PATADAY) 0.7 % SOLN Apply 1 drop to eye daily. 04/15/20   Kozlow, Alvira PhilipsEric J, MD  pantoprazole (PROTONIX) 40 MG tablet Take 1 tablet (40 mg total) by mouth 2 (two) times daily. 03/03/21   Ardith DarkParker, Caleb M, MD  rizatriptan (MAXALT) 10 MG tablet Take 1 tablet (10 mg total) by mouth as needed for migraine. Reported on 08/12/2015 08/31/21   Ardith DarkParker, Caleb M, MD  Spacer/Aero-Holding Chambers (AEROCHAMBER PLUS)  inhaler Use as instructed 05/02/17   Jessica PriestKozlow, Eric J, MD  topiramate (TOPAMAX) 100 MG tablet TAKE 1/2 TABLET IN THE MORNING AND TAKE 1 TABLET AT BEDTIME. 03/03/21   Ardith DarkParker, Caleb M, MD  zolpidem (AMBIEN) 10 MG tablet TAKE ONE TABLET BY MOUTH AT BEDTIME 09/21/21   Ardith DarkParker, Caleb M, MD      Allergies    Sulfa antibiotics, Sulfamethoxazole-trimethoprim, and Metronidazole    Review of Systems   Review of Systems  Physical Exam Updated Vital Signs BP (!) 158/87 (BP Location: Right Arm)   Pulse 99   Temp 99.2 F (37.3 C) (Oral)   Resp 16   Ht 5\' 5"  (1.651 m)   Wt 94.8 kg   SpO2 95%   BMI 34.78 kg/m  Physical Exam Vitals and nursing note reviewed.  Constitutional:      General: She is not in acute distress.    Appearance: She is well-developed. She is not diaphoretic.  HENT:     Head: Normocephalic and atraumatic.     Right Ear: A middle ear effusion is present.     Left Ear: A middle ear effusion is present.  Eyes:     Conjunctiva/sclera: Conjunctivae normal.  Cardiovascular:     Rate and Rhythm: Normal rate and regular rhythm.     Heart sounds: Normal heart sounds. No murmur heard.   No friction rub. No gallop.  Pulmonary:     Effort: Pulmonary effort is normal. No respiratory distress.     Breath sounds: Normal breath sounds. No wheezing or rales.  Abdominal:     General: There is no distension.     Palpations: Abdomen is soft.     Tenderness: There is no abdominal tenderness. There is no guarding.  Musculoskeletal:        General: No tenderness.     Cervical back: Normal range of motion.  Skin:    General: Skin is warm and dry.     Findings: No erythema or rash.  Neurological:     Mental Status: She is alert and oriented to person, place, and time.    ED Results / Procedures / Treatments   Labs (all labs ordered are listed, but only abnormal results are displayed) Labs Reviewed  BASIC METABOLIC PANEL - Abnormal; Notable for the following components:      Result  Value   BUN 6 (*)    All other components within normal limits  CBC WITH DIFFERENTIAL/PLATELET - Abnormal; Notable for the following components:   HCT 46.4 (*)    All other components within normal limits  GROUP A STREP BY PCR  SARS CORONAVIRUS 2 BY RT PCR    EKG None  Radiology No results found.  Procedures Procedures    Medications Ordered in ED Medications  albuterol (VENTOLIN HFA) 108 (90 Base) MCG/ACT inhaler 2  puff (has no administration in time range)  dexamethasone (DECADRON) tablet 10 mg (has no administration in time range)    ED Course/ Medical Decision Making/ A&P                           Medical Decision Making Amount and/or Complexity of Data Reviewed External Data Reviewed: notes. Labs: ordered. Decision-making details documented in ED Course.  Risk Prescription drug management.    71yo female with history of goiter, OSA, moderate persistent asthma, laryngopharyngeal reflux disease, who presents with concern for body aches, decreased appetite, sore throat.  Strep testing negative. No sign of PTA, RPA, epiglottitis.  No significant electrolyte abnormalities, no anemia to account for fatigue.  Suspect likely viral syndrome given combination of symptoms.  COVID 19 testing negative.  She does have ear pain and bilateral effusions, will treat with augmentin x10 days. Not wheezing at this time but has been at home some and will give dose of decadron and albuterol inhaler to use q4-6hr PRN cough or wheeze. Patient discharged in stable condition with understanding of reasons to return.          Final Clinical Impression(s) / ED Diagnoses Final diagnoses:  Upper respiratory tract infection, unspecified type  Viral pharyngitis  Non-recurrent acute serous otitis media of both ears  Myalgia    Rx / DC Orders ED Discharge Orders          Ordered    amoxicillin-clavulanate (AUGMENTIN) 875-125 MG tablet  Every 12 hours        11/03/21 1718               Alvira Monday, MD 11/03/21 1721

## 2021-11-03 NOTE — ED Provider Triage Note (Signed)
Emergency Medicine Provider Triage Evaluation Note  Brooke Schneider , a 71 y.o. female  was evaluated in triage.  Pt complains of sore throat and intermittent subjective fevers over the last 2 days.  Highest fever of 99.7.  Sudden onset.  With generalized body aches and decreased appetite.  Denies difficulty or painful swallowing.  Denies difficulty breathing, chest pain, shortness of breath, or cough.  Hx of MDD, GERD, IBS, asthma, depression, headaches, insomnia, allergies.  Hx of total thyroidectomy, on thyroid placement therapy.  Review of Systems  Positive:  Negative: As above  Physical Exam  BP (!) 158/87 (BP Location: Right Arm)   Pulse 99   Temp 99.2 F (37.3 C) (Oral)   Resp 16   Ht 5\' 5"  (1.651 m)   Wt 94.8 kg   SpO2 95%   BMI 34.78 kg/m  Gen:   Awake, no distress   Resp:  Normal effort, CTAB, able to communicate without difficulty MSK:   Moves extremities without difficulty  Other:  Abdomen soft nontender.  Chest non-TTP.  RRR without M/R/G.  Afebrile.  Oropharynx without erythema, exudate, swelling.  Able to swallow without difficulty.  Mild cervical adenopathy.  Medical Decision Making  Medically screening exam initiated at 3:46 PM.  Appropriate orders placed.  Brooke Schneider was informed that the remainder of the evaluation will be completed by another provider, this initial triage assessment does not replace that evaluation, and the importance of remaining in the ED until their evaluation is complete.  Labs ordered.   Prince Rome, PA-C [phone]

## 2021-11-03 NOTE — ED Triage Notes (Signed)
Pt arrived POV from home c/o sore throat and generalized body aches and not wanting to eat x 2-3 days.

## 2021-11-05 ENCOUNTER — Ambulatory Visit: Payer: Self-pay | Admitting: *Deleted

## 2021-11-05 NOTE — Telephone Encounter (Signed)
The patient was seen previously for an upper respiratory infection   The patient shares that they have began to take their antibiotics and their stool has been green in color   The patient has also experienced a cough as well as congestion   The patient shares that the cough is dry and uncomfortable   The patient also has an elevated temperature of 99.7 degrees   Please contact further when possible

## 2021-11-06 ENCOUNTER — Ambulatory Visit: Payer: Self-pay | Admitting: *Deleted

## 2021-11-06 NOTE — Telephone Encounter (Signed)
  Chief Complaint: coughing, green stool since taking Augmentin Symptoms: non productive Frequency: moderate Pertinent Negatives: Patient denies productive cough Disposition: [] ED /[] Urgent Care (no appt availability in office) / [] Appointment(In office/virtual)/ []  [place] [facility]/ [] Home Care/ [] Refused Recommended Disposition /[]  Mobile Bus/ []  Follow-up with PCP Additional Notes: Community pt, she already has appt with PCP Monday. She was concerned about green stools (on Augmenten) and cough which is non productive, I did not hear her cough while on phone.  Reason for Disposition  [1] Patient also has allergy symptoms (e.g., itchy eyes, clear nasal discharge, postnasal drip) AND [2] they are acting up  Answer Assessment - Initial Assessment Questions 1. ONSET: "When did the cough begin?"      New since ED visit 2. SEVERITY: "How bad is the cough today?"      Non productice 3. SPUTUM: "Describe the color of your sputum" (none, dry cough; clear, white, yellow, green)     none 4. HEMOPTYSIS: "Are you coughing up any blood?" If so ask: "How much?" (flecks, streaks, tablespoons, etc.)     no 5. DIFFICULTY BREATHING: "Are you having difficulty breathing?" If Yes, ask: "How bad is it?" (e.g., mild, moderate, severe)    - MILD: No SOB at rest, mild SOB with walking, speaks normally in sentences, can lie down, no retractions, pulse < 100.    - MODERATE: SOB at rest, SOB with minimal exertion and prefers to sit, cannot lie down flat, speaks in phrases, mild retractions, audible wheezing, pulse 100-120.    - SEVERE: Very SOB at rest, speaks in single words, struggling to breathe, sitting hunched forward, retractions, pulse > 120      no 6. FEVER: "Do you have a fever?" If Yes, ask: "What is your temperature, how was it measured, and when did it start?"     99.7 7. CARDIAC HISTORY: "Do you have any history of heart disease?" (e.g., heart attack, congestive heart failure)       no 8. LUNG HISTORY: "Do you have any history of lung disease?"  (e.g., pulmonary embolus, asthma, emphysema)     asthma 9. PE RISK FACTORS: "Do you have a history of blood clots?" (or: recent major surgery, recent prolonged travel, bedridden)     no 10. OTHER SYMPTOMS: "Do you have any other symptoms?" (e.g., runny nose, wheezing, chest pain)       no 11. PREGNANCY: "Is there any chance you are pregnant?" "When was your last menstrual period?"       no 12. TRAVEL: "Have you traveled out of the country in the last month?" (e.g., travel history, exposures)       na  Protocols used: Cough - Acute Non-Productive-A-AH

## 2021-11-06 NOTE — Telephone Encounter (Signed)
Third attempt to reach pt. Left message to call back. 

## 2021-11-06 NOTE — Telephone Encounter (Signed)
Left message to call back about symptoms. °

## 2021-11-08 ENCOUNTER — Other Ambulatory Visit: Payer: Self-pay | Admitting: Allergy & Immunology

## 2021-11-09 ENCOUNTER — Ambulatory Visit (INDEPENDENT_AMBULATORY_CARE_PROVIDER_SITE_OTHER): Payer: Medicare Other | Admitting: Physical Therapy

## 2021-11-09 ENCOUNTER — Encounter: Payer: Self-pay | Admitting: Physical Therapy

## 2021-11-09 ENCOUNTER — Encounter: Payer: Self-pay | Admitting: Family Medicine

## 2021-11-09 ENCOUNTER — Ambulatory Visit (INDEPENDENT_AMBULATORY_CARE_PROVIDER_SITE_OTHER): Payer: Medicare Other | Admitting: Family Medicine

## 2021-11-09 VITALS — BP 121/71 | HR 83 | Temp 97.7°F | Ht 65.0 in | Wt 205.2 lb

## 2021-11-09 DIAGNOSIS — G8929 Other chronic pain: Secondary | ICD-10-CM

## 2021-11-09 DIAGNOSIS — M25661 Stiffness of right knee, not elsewhere classified: Secondary | ICD-10-CM | POA: Diagnosis not present

## 2021-11-09 DIAGNOSIS — M6281 Muscle weakness (generalized): Secondary | ICD-10-CM | POA: Diagnosis not present

## 2021-11-09 DIAGNOSIS — T7840XA Allergy, unspecified, initial encounter: Secondary | ICD-10-CM | POA: Diagnosis not present

## 2021-11-09 DIAGNOSIS — T7840XS Allergy, unspecified, sequela: Secondary | ICD-10-CM

## 2021-11-09 DIAGNOSIS — R059 Cough, unspecified: Secondary | ICD-10-CM | POA: Diagnosis not present

## 2021-11-09 DIAGNOSIS — K580 Irritable bowel syndrome with diarrhea: Secondary | ICD-10-CM | POA: Diagnosis not present

## 2021-11-09 DIAGNOSIS — M25561 Pain in right knee: Secondary | ICD-10-CM

## 2021-11-09 DIAGNOSIS — B360 Pityriasis versicolor: Secondary | ICD-10-CM

## 2021-11-09 DIAGNOSIS — H6690 Otitis media, unspecified, unspecified ear: Secondary | ICD-10-CM | POA: Diagnosis not present

## 2021-11-09 DIAGNOSIS — R600 Localized edema: Secondary | ICD-10-CM | POA: Diagnosis not present

## 2021-11-09 DIAGNOSIS — F5101 Primary insomnia: Secondary | ICD-10-CM

## 2021-11-09 DIAGNOSIS — J4540 Moderate persistent asthma, uncomplicated: Secondary | ICD-10-CM

## 2021-11-09 MED ORDER — PREDNISONE 50 MG PO TABS: ORAL_TABLET | ORAL | 0 refills | Status: DC

## 2021-11-09 MED ORDER — HYDROXYZINE HCL 25 MG PO TABS: 25.0000 mg | ORAL_TABLET | Freq: Two times a day (BID) | ORAL | 0 refills | Status: DC | PRN

## 2021-11-09 MED ORDER — KETOCONAZOLE 2 % EX SHAM: 1.0000 "application " | MEDICATED_SHAMPOO | CUTANEOUS | 2 refills | Status: DC

## 2021-11-09 MED ORDER — DICYCLOMINE HCL 20 MG PO TABS
ORAL_TABLET | ORAL | 2 refills | Status: DC
Start: 2021-11-09 — End: 2022-11-19

## 2021-11-09 NOTE — Therapy (Signed)
OUTPATIENT PHYSICAL THERAPY LOWER EXTREMITY EVALUATION   Patient Name: Brooke Schneider MRN: [id number] DOB:1950/11/19, 71 y.o., female Today's Date: 11/09/2021   PT End of Session - 11/09/21 1223     Visit Number 1    Number of Visits 15    Date for PT Re-Evaluation 01/04/22    Progress Note Due on Visit 10    PT Start Time 1140    PT Stop Time 1220    PT Time Calculation (min) 40 min    Behavior During Therapy WFL for tasks assessed/performed             Past Medical History:  Diagnosis Date   Allergic rhinitis    Arthritis    Asthma    Depression    Goiter    Goiter    Headache(784.0)    Sleep apnea    Past Surgical History:  Procedure Laterality Date   ABDOMINAL HYSTERECTOMY     CHOLECYSTECTOMY N/A 11/02/2013   Procedure: LAPAROSCOPIC CHOLECYSTECTOMY ;  Surgeon: Gwenyth Ober, MD;  Location: [place];  Service: General;  Laterality: N/A;   KNEE SURGERY     THYROIDECTOMY     Patient Active Problem List   Diagnosis Date Noted   GERD (gastroesophageal reflux disease) 07/17/2019   OA (osteoarthritis) of knee s/p TKA 2021 04/13/2019   MDD (major depressive disorder) 09/13/2016   Tinea versicolor 07/06/2016   IBS (irritable bowel syndrome) 01/02/2016   Hypothyroidism 10/17/2015   Migraine 11/27/2012   Insomnia 07/21/2009   Allergies 01/19/2008   Asthma, allergic 01/19/2008    PCP: Vivi Barrack, MD  REFERRING PROVIDER: Newt Minion, MD  REFERRING DIAG: [phone] (ICD-10-CM) - Chronic pain of right knee  THERAPY DIAG:  Chronic pain of right knee  Stiffness of right knee, not elsewhere classified  Muscle weakness (generalized)  Localized edema  Rationale for Evaluation and Treatment Rehabilitation  ONSET DATE: Rt TKA 2020  SUBJECTIVE:   SUBJECTIVE STATEMENT: She has history of Rt TKA in 2020 or so she thinks. She says She is still having some pain, stiffness with bending 6/10, and weakness  PERTINENT HISTORY: S/P Rt TKA in 2020, major  depressive disorder.sleep apnea  PAIN:  Are you having pain? Yes: NPRS scale: 4/10 Pain location: anterior Rt knee pain Pain description: dull Aggravating factors: bending, stand up from toilet, stairs Relieving factors: rest  PRECAUTIONS: None  WEIGHT BEARING RESTRICTIONS No  FALLS:  Has patient fallen in last 6 months? No  OCCUPATION: retried  PLOF: Independent  PATIENT GOALS : less stiffness and pain, get stronge   OBJECTIVE:   DIAGNOSTIC FINDINGS: 2 view radiographs of the right knee shows a stable total knee  arthroplasty.  There may be some slight lucent lines around the implants.    There is slight movement artifact.  PATIENT SURVEYS:  FOTO 46% functional  COGNITION:  Overall cognitive status: Within functional limits for tasks assessed     SENSATION: WFL  EDEMA:  Mild edema noted in Rt knee   MUSCLE LENGTH: Hamstrings: mild tightness Thomas test: knee joint stiffness does not allow to fully asess    PALPATION: TTP anterior Right knee, severe knee hypomobility into flexion and patella mobs  LOWER EXTREMITY ROM:  Active ROM/PROM Right eval Left eval  Hip flexion    Hip extension    Hip abduction    Hip adduction    Hip internal rotation    Hip external rotation    Knee flexion 69/75   Knee  extension 1/0   Ankle dorsiflexion    Ankle plantarflexion    Ankle inversion    Ankle eversion     (Blank rows = not tested)  LOWER EXTREMITY MMT:  MMT Right eval Left eval  Hip flexion 4 4+  Hip extension    Hip abduction 4 4+  Hip adduction    Hip internal rotation    Hip external rotation    Knee flexion 5 5  Knee extension 32# avg 5-/5 38# avg 5/5  Ankle dorsiflexion    Ankle plantarflexion    Ankle inversion    Ankle eversion     (Blank rows = not tested)  LOWER EXTREMITY SPECIAL TESTS:    FUNCTIONAL TESTS:  5 times sit to stand: 14.47 no UE support  GAIT: Distance walked: limited community ambulator Level of assistance:  Complete Independence Comments: decreased velocity and decreased knee flexion noted    TODAY'S TREATMENT: 11/09/21 Reviewed HEP listed below Nu step L5 for 5 min UE/LE stretching into max knee flexion tolerated Manual therapy: Rt knee PROM into flexion as tolerated, patella mobs   PATIENT EDUCATION:  Education details: HEP, PT plan of care Person educated: Patient Education method: Explanation, Demonstration, Verbal cues, and Handouts Education comprehension: verbalized understanding   HOME EXERCISE PROGRAM: Access Code: [email or web address] URL: [email or web address] Date: 11/09/2021 Prepared by: Ivery Quale  Exercises - Supine Heel Slide with Strap  - 2 x daily - 6 x weekly - 1-2 sets - 10 reps - 5 hold - Seated Knee Flexion Stretch  - 2 x daily - 6 x weekly - 1-2 sets - 10 reps - 5 sec hold - Seated Hamstring Stretch  - 2 x daily - 6 x weekly - 1 sets - 2-3 reps - 30 hold - Seated Straight Leg Heel Taps  - 2 x daily - 6 x weekly - 2-3 sets - 10 reps - Mini Squat with Counter Support  - 2 x daily - 6 x weekly - 1-2 sets - 10 reps - 5 sec hold   ASSESSMENT:  CLINICAL IMPRESSION: Patient presents with chronic Rt knee pain, severe knee stiffness and moderate weakness, S/P Rt TKA in 2020 (date is per patient report). Patient will benefit from skilled PT to address below impairments, limitations and improve overall function.  OBJECTIVE IMPAIRMENTS: decreased activity tolerance, difficulty walking, decreased balance, decreased endurance, decreased mobility, decreased ROM, decreased strength, impaired flexibility, impaired LE use, postural dysfunction, and pain.  ACTIVITY LIMITATIONS: bending, lifting, carry, locomotion, cleaning, community activity, driving, stairs  PERSONAL FACTORS: S/P Rt TKA in 2020, major depressive disorder.sleep apnea are also affecting patient's functional outcome.  REHAB POTENTIAL: Good  CLINICAL DECISION MAKING: Stable/uncomplicated  EVALUATION  COMPLEXITY: Low    GOALS: Short term PT Goals Target date: 12/07/2021 Pt will be I and compliant with HEP. Baseline:  Goal status: New Pt will decrease pain by 25% overall Baseline: Goal status: New  Long term PT goals Target date: 01/04/2022 Pt will improve Rt knee flexion PROM to 90 deg to improve functional mobility Baseline: Goal status: New Pt will improve  hip/knee strength to at least 5/5 MMT and 37# knee extension  strength to improve functional strength Baseline: Goal status: New Pt will improve FOTO to at least 50% functional to show improved function Baseline: Goal status: New Pt will reduce pain by overall 50% overall with usual activity Baseline: Goal status: New Pt will report decreased difficulty with stairs and community ambulation Baseline: Goal status: New  PLAN: PT FREQUENCY:  1-3 times per week   PT DURATION: 6-8 weeks  PLANNED INTERVENTIONS (unless contraindicated): aquatic PT, Canalith repositioning, cryotherapy, Electrical stimulation, Iontophoresis with 4 mg/ml dexamethasome, Moist heat, traction, Ultrasound, gait training, Therapeutic exercise, balance training, neuromuscular re-education, patient/family education, prosthetic training, manual techniques, passive ROM, dry needling, taping, vasopnuematic device, vestibular, spinal manipulations, joint manipulations  PLAN FOR NEXT SESSION: review HEP    Debbe Odea, PT,DPT 11/09/2021, 12:24 PM

## 2021-11-09 NOTE — Assessment & Plan Note (Signed)
She has followed with GI in the past.  She has been out of [place] has had some worsening of her symptoms.  We will restart today.  She has tolerated well in the past.

## 2021-11-09 NOTE — Assessment & Plan Note (Signed)
Continue management of her allergies.  She is on Singulair.  We will refill her hydroxyzine today.

## 2021-11-09 NOTE — Assessment & Plan Note (Signed)
Refill ketoconazole

## 2021-11-09 NOTE — Assessment & Plan Note (Signed)
Symptoms were stable until her recent flare.  We will treat with a prednisone burst 50 mg daily for 5 days.  She will continue her asthma regimen otherwise with Advair and Singulair.  She will also continue albuterol as needed.

## 2021-11-09 NOTE — Patient Instructions (Signed)
It was very nice to see you today!  Please start prednisone.  I will refill your other medications.  Please finish your antibiotics.  Let me know if not improving in th next week or so.   Take care, Dr Jerline Pain  PLEASE NOTE:  If you had any lab tests please let us know if you have not heard back within a few days. You may see your results on mychart before we have a chance to review them but we will give you a call once they are reviewed by [place]. If we ordered any referrals today, please let us know if you have not heard from their office within the next week.   Please try these tips to maintain a healthy lifestyle:  Eat at least 3 REAL meals and 1-2 snacks per day.  Aim for no more than 5 hours between eating.  If you eat breakfast, please do so within one hour of getting up.   Each meal should contain half fruits/vegetables, one quarter protein, and one quarter carbs (no bigger than a computer mouse)  Cut down on sweet beverages. This includes juice, soda, and sweet tea.   Drink at least 1 glass of water with each meal and aim for at least 8 glasses per day  Exercise at least 150 minutes every week.

## 2021-11-09 NOTE — Progress Notes (Signed)
   Brooke Schneider is a 71 y.o. female who presents today for an office visit.  Assessment/Plan:  New/Acute Problems: Otitis Media Resolved though she will finish her course of Augmentin.  Cough  She does have some coarse rhonchi and wheezing on exam.  May have a slight asthma flare which we will treat with a burst of prednisone.  She is already a month Augmentin for otitis media and will continue as planned.  She can continue using her allergy meds as well.  Chronic Problems Addressed Today: IBS (irritable bowel syndrome) She has followed with GI in the past.  She has been out of Bentyl has had some worsening of her symptoms.  We will restart today.  She has tolerated well in the past.  Asthma, allergic Symptoms were stable until her recent flare.  We will treat with a prednisone burst 50 mg daily for 5 days.  She will continue her asthma regimen otherwise with Advair and Singulair.  She will also continue albuterol as needed.  Allergies Continue management of her allergies.  She is on Singulair.  We will refill her hydroxyzine today.  Tinea versicolor Refill ketoconazole.     Subjective:  HPI:  Patient here for ED follow-up.  Went to the ED 6 days ago with sore throat, body aches, headache, and ear pain.  Strep was negative.  She was diagnosed with viral illness.  Her COVID test was negative.  There was also concern for otitis media.  She was given a dose of Decadron and treated with 10 days of Augmentin.  She initially did well for several days after comiing home. Over the last couple of days she has had more cough and thick yellow sputum production.        Objective:  Physical Exam: BP 121/71   Pulse 83   Temp 97.7 F (36.5 C) (Temporal)   Ht 5\' 5"  (1.651 m)   Wt 205 lb 3.2 oz (93.1 kg)   SpO2 96%   BMI 34.15 kg/m   Gen: No acute distress, resting comfortably HEENT: TMs clear bilaterally CV: Regular rate and rhythm with no murmurs appreciated Pulm: Normal work of  breathing, coarse rhonchi with few scattered wheezes in bilateral lung bases. Neuro: Grossly normal, moves all extremities Psych: Normal affect and thought content  Time Spent: 50 minutes of total time was spent on the date of the encounter performing the following actions: chart review prior to seeing the patient including recent ED visit, obtaining history, performing a medically necessary exam, counseling on the treatment plan, placing orders, and documenting in our EHR.        . Katina Degree, MD 11/09/2021 9:05 AM

## 2021-11-12 ENCOUNTER — Encounter: Payer: Self-pay | Admitting: Orthopedic Surgery

## 2021-11-12 ENCOUNTER — Ambulatory Visit (INDEPENDENT_AMBULATORY_CARE_PROVIDER_SITE_OTHER): Payer: Medicare Other | Admitting: Orthopedic Surgery

## 2021-11-12 DIAGNOSIS — G8929 Other chronic pain: Secondary | ICD-10-CM

## 2021-11-12 DIAGNOSIS — T8482XS Fibrosis due to internal orthopedic prosthetic devices, implants and grafts, sequela: Secondary | ICD-10-CM | POA: Diagnosis not present

## 2021-11-12 DIAGNOSIS — M25561 Pain in right knee: Secondary | ICD-10-CM | POA: Diagnosis not present

## 2021-11-12 NOTE — Progress Notes (Signed)
Office Visit Note   Patient: Brooke Schneider           Date of Birth: [birth year]           MRN: [id number] Visit Date: 11/12/2021              Requested by: Ardith Dark, MD [address] [place],  [place] [address] PCP: Ardith Dark, MD  Chief Complaint  Patient presents with   Right Knee - Pain      HPI: Patient is a 71 year old woman who is status post right total knee arthroplasty with Dr. Marlyne Beards around 2006 in [place].  Patient initially postoperatively had decreased range of motion and underwent a manipulation under anesthesia.  Patient states she still has decreased range of motion still has pain she just started physical therapy 3 days ago.  Assessment & Plan: Visit Diagnoses:  1. Chronic pain of right knee   2. Arthrofibrosis of total knee replacement, sequela     Plan: Continue with physical therapy follow-up in 4 weeks.  Follow-Up Instructions: Return in about 4 weeks (around 12/10/2021).   Ortho Exam  Patient is alert, oriented, no adenopathy, well-dressed, normal affect, normal respiratory effort. Examination patient has full active extension with flexion to only 70 degrees.  The knee is stable varus and valgus anterior drawer stable there is no effusion there is no redness.  Extensor mechanism is intact.  Imaging: No results found. No images are attached to the encounter.  Labs: Lab Results  Component Value Date   HGBA1C 6.0 08/31/2021   ESRSEDRATE 9 06/11/2014   LABORGA Normal Upper Respiratory Flora 07/21/2015   LABORGA No Beta Hemolytic Streptococci Isolated 07/21/2015     Lab Results  Component Value Date   ALBUMIN 4.3 08/31/2021   ALBUMIN 3.9 09/09/2016   ALBUMIN 4.1 10/17/2015    No results found for: "MG" No results found for: "VD25OH"  No results found for: "PREALBUMIN"    Latest Ref Rng & Units 11/03/2021    4:08 PM 08/31/2021    1:43 PM 04/15/2020    2:35 PM  CBC EXTENDED  WBC 4.0 - 10.5 K/uL 4.5  5.3  5.1   RBC 3.87 -  5.11 MIL/uL 4.91  4.59  4.74   Hemoglobin 12.0 - 15.0 g/dL 46.8  03.2  12.2   HCT 36.0 - 46.0 % 46.4  42.4  42.4   Platelets 150 - 400 K/uL 240  213.0    NEUT# 1.7 - 7.7 K/uL 1.7   1.6   Lymph# 0.7 - 4.0 K/uL 2.1   2.8      There is no height or weight on file to calculate BMI.  Orders:  No orders of the defined types were placed in this encounter.  No orders of the defined types were placed in this encounter.    Procedures: No procedures performed  Clinical Data: No additional findings.  ROS:  All other systems negative, except as noted in the HPI. Review of Systems  Objective: Vital Signs: There were no vitals taken for this visit.  Specialty Comments:  No specialty comments available.  PMFS History: Patient Active Problem List   Diagnosis Date Noted   GERD (gastroesophageal reflux disease) 07/17/2019   OA (osteoarthritis) of knee s/p TKA 2021 04/13/2019   MDD (major depressive disorder) 09/13/2016   Tinea versicolor 07/06/2016   IBS (irritable bowel syndrome) 01/02/2016   Hypothyroidism 10/17/2015   Migraine 11/27/2012   Insomnia 07/21/2009   Allergies 01/19/2008  Asthma, allergic 01/19/2008   Past Medical History:  Diagnosis Date   Allergic rhinitis    Arthritis    Asthma    Depression    Goiter    Goiter    Headache(784.0)    Sleep apnea     Family History  Problem Relation Age of Onset   Allergies Mother    Dementia Mother    ALS Father    Prostate cancer Father    Colon cancer Neg Hx    Esophageal cancer Neg Hx    Stomach cancer Neg Hx    Rectal cancer Neg Hx     Past Surgical History:  Procedure Laterality Date   ABDOMINAL HYSTERECTOMY     CHOLECYSTECTOMY N/A 11/02/2013   Procedure: LAPAROSCOPIC CHOLECYSTECTOMY ;  Surgeon: Cherylynn Ridges, MD;  Location: [facility] OR;  Service: General;  Laterality: N/A;   KNEE SURGERY     THYROIDECTOMY     Social History   Occupational History   Occupation: housewife  Tobacco Use   Smoking status:  Never    Passive exposure: Yes   Smokeless tobacco: Never   Tobacco comments:    husband smoking cigars  Vaping Use   Vaping Use: Never used  Substance and Sexual Activity   Alcohol use: Yes    Comment: occ   Drug use: Never   Sexual activity: Not Currently

## 2021-11-23 ENCOUNTER — Ambulatory Visit (INDEPENDENT_AMBULATORY_CARE_PROVIDER_SITE_OTHER): Payer: Medicare Other

## 2021-11-23 VITALS — BP 128/68 | HR 90 | Temp 98.0°F | Wt 207.6 lb

## 2021-11-23 DIAGNOSIS — Z0000 Encounter for general adult medical examination without abnormal findings: Secondary | ICD-10-CM

## 2021-11-23 NOTE — Patient Instructions (Signed)
Ms. Defrain , Thank you for taking time to come for your Medicare Wellness Visit. I appreciate your ongoing commitment to your health goals. Please review the following plan we discussed and let me know if I can assist you in the future.   Screening recommendations/referrals: Colonoscopy: Done Done 08/05/21 repeat every 10 years  Mammogram: Done 06/16/21 repeat every year  Bone Density: Postponed at this time  Recommended yearly ophthalmology/optometry visit for glaucoma screening and checkup Recommended yearly dental visit for hygiene and checkup  Vaccinations: Influenza vaccine: Done 03/03/21 repeat every year  Pneumococcal vaccine: Up to date Tdap vaccine: Due and discussed  Shingles vaccine: Shingrix discussed. Please contact your pharmacy for coverage information.    Covid-19 Completed 3/10, 09/05/19 & 1/31, 05/23/21  Advanced directives: Advance directive discussed with you today. I have provided a copy for you to complete at home and have notarized. Once this is complete please bring a copy in to our office so we can scan it into your chart.  Conditions/risks identified: work with PT for right knee   Next appointment: Follow up in one year for your annual wellness visit    Preventive Care 65 Years and Older, Female Preventive care refers to lifestyle choices and visits with your health care provider that can promote health and wellness. What does preventive care include? A yearly physical exam. This is also called an annual well check. Dental exams once or twice a year. Routine eye exams. Ask your health care provider how often you should have your eyes checked. Personal lifestyle choices, including: Daily care of your teeth and gums. Regular physical activity. Eating a healthy diet. Avoiding tobacco and drug use. Limiting alcohol use. Practicing safe sex. Taking low-dose aspirin every day. Taking vitamin and mineral supplements as recommended by your health care  provider. What happens during an annual well check? The services and screenings done by your health care provider during your annual well check will depend on your age, overall health, lifestyle risk factors, and family history of disease. Counseling  Your health care provider may ask you questions about your: Alcohol use. Tobacco use. Drug use. Emotional well-being. Home and relationship well-being. Sexual activity. Eating habits. History of falls. Memory and ability to understand (cognition). Work and work Astronomer. Reproductive health. Screening  You may have the following tests or measurements: Height, weight, and BMI. Blood pressure. Lipid and cholesterol levels. These may be checked every 5 years, or more frequently if you are over 69 years old. Skin check. Lung cancer screening. You may have this screening every year starting at age 24 if you have a 30-pack-year history of smoking and currently smoke or have quit within the past 15 years. Fecal occult blood test (FOBT) of the stool. You may have this test every year starting at age 72. Flexible sigmoidoscopy or colonoscopy. You may have a sigmoidoscopy every 5 years or a colonoscopy every 10 years starting at age 20. Hepatitis C blood test. Hepatitis B blood test. Sexually transmitted disease (STD) testing. Diabetes screening. This is done by checking your blood sugar (glucose) after you have not eaten for a while (fasting). You may have this done every 1-3 years. Bone density scan. This is done to screen for osteoporosis. You may have this done starting at age 74. Mammogram. This may be done every 1-2 years. Talk to your health care provider about how often you should have regular mammograms. Talk with your health care provider about your test results, treatment options, and if  necessary, the need for more tests. Vaccines  Your health care provider may recommend certain vaccines, such as: Influenza vaccine. This is  recommended every year. Tetanus, diphtheria, and acellular pertussis (Tdap, Td) vaccine. You may need a Td booster every 10 years. Zoster vaccine. You may need this after age 53. Pneumococcal 13-valent conjugate (PCV13) vaccine. One dose is recommended after age 79. Pneumococcal polysaccharide (PPSV23) vaccine. One dose is recommended after age 93. Talk to your health care provider about which screenings and vaccines you need and how often you need them. This information is not intended to replace advice given to you by your health care provider. Make sure you discuss any questions you have with your health care provider. Document Released: 06/20/2015 Document Revised: 02/11/2016 Document Reviewed: 03/25/2015 Elsevier Interactive Patient Education  2017 [place] Prevention in the Home Falls can cause injuries. They can happen to people of all ages. There are many things you can do to make your home safe and to help prevent falls. What can I do on the outside of my home? Regularly fix the edges of walkways and driveways and fix any cracks. Remove anything that might make you trip as you walk through a door, such as a raised step or threshold. Trim any bushes or trees on the path to your home. Use bright outdoor lighting. Clear any walking paths of anything that might make someone trip, such as rocks or tools. Regularly check to see if handrails are loose or broken. Make sure that both sides of any steps have handrails. Any raised decks and porches should have guardrails on the edges. Have any leaves, snow, or ice cleared regularly. Use sand or salt on walking paths during winter. Clean up any spills in your garage right away. This includes oil or grease spills. What can I do in the bathroom? Use night lights. Install grab bars by the toilet and in the tub and shower. Do not use towel bars as grab bars. Use non-skid mats or decals in the tub or shower. If you need to sit down in  the shower, use a plastic, non-slip stool. Keep the floor dry. Clean up any water that spills on the floor as soon as it happens. Remove soap buildup in the tub or shower regularly. Attach bath mats securely with double-sided non-slip rug tape. Do not have throw rugs and other things on the floor that can make you trip. What can I do in the bedroom? Use night lights. Make sure that you have a light by your bed that is easy to reach. Do not use any sheets or blankets that are too big for your bed. They should not hang down onto the floor. Have a firm chair that has side arms. You can use this for support while you get dressed. Do not have throw rugs and other things on the floor that can make you trip. What can I do in the kitchen? Clean up any spills right away. Avoid walking on wet floors. Keep items that you use a lot in easy-to-reach places. If you need to reach something above you, use a strong step stool that has a grab bar. Keep electrical cords out of the way. Do not use floor polish or wax that makes floors slippery. If you must use wax, use non-skid floor wax. Do not have throw rugs and other things on the floor that can make you trip. What can I do with my stairs? Do not leave any items  on the stairs. Make sure that there are handrails on both sides of the stairs and use them. Fix handrails that are broken or loose. Make sure that handrails are as long as the stairways. Check any carpeting to make sure that it is firmly attached to the stairs. Fix any carpet that is loose or worn. Avoid having throw rugs at the top or bottom of the stairs. If you do have throw rugs, attach them to the floor with carpet tape. Make sure that you have a light switch at the top of the stairs and the bottom of the stairs. If you do not have them, ask someone to add them for you. What else can I do to help prevent falls? Wear shoes that: Do not have high heels. Have rubber bottoms. Are comfortable  and fit you well. Are closed at the toe. Do not wear sandals. If you use a stepladder: Make sure that it is fully opened. Do not climb a closed stepladder. Make sure that both sides of the stepladder are locked into place. Ask someone to hold it for you, if possible. Clearly mark and make sure that you can see: Any grab bars or handrails. First and last steps. Where the edge of each step is. Use tools that help you move around (mobility aids) if they are needed. These include: Canes. Walkers. Scooters. Crutches. Turn on the lights when you go into a dark area. Replace any light bulbs as soon as they burn out. Set up your furniture so you have a clear path. Avoid moving your furniture around. If any of your floors are uneven, fix them. If there are any pets around you, be aware of where they are. Review your medicines with your doctor. Some medicines can make you feel dizzy. This can increase your chance of falling. Ask your doctor what other things that you can do to help prevent falls. This information is not intended to replace advice given to you by your health care provider. Make sure you discuss any questions you have with your health care provider. Document Released: 03/20/2009 Document Revised: 10/30/2015 Document Reviewed: 06/28/2014 Elsevier Interactive Patient Education  2017 [employer].

## 2021-11-23 NOTE — Progress Notes (Signed)
Subjective:   Brooke Schneider is a 71 y.o. female who presents for Medicare Annual (Subsequent) preventive examination.  Review of Systems     Cardiac Risk Factors include: advanced age (>2355men, 79>65 women);obesity (BMI >30kg/m2)     Objective:    Today's Vitals   11/23/21 1110  BP: 128/68  Pulse: 90  Temp: 98 F (36.7 C)  SpO2: 98%  Weight: 207 lb 9.6 oz (94.2 kg)   Body mass index is 34.55 kg/m.     11/23/2021   11:20 AM 11/09/2021   11:48 AM 09/05/2020    2:45 PM 03/13/2019    3:07 PM 02/20/2018   10:29 AM 01/02/2016    8:05 AM 08/02/2015   11:18 AM  Advanced Directives  Does Patient Have a Medical Advance Directive? No No Yes No No No No  Does patient want to make changes to medical advance directive?   Yes (MAU/Ambulatory/Procedural Areas - Information given)      Would patient like information on creating a medical advance directive? Yes (MAU/Ambulatory/Procedural Areas - Information given) Yes (MAU/Ambulatory/Procedural Areas - Information given)  Yes (MAU/Ambulatory/Procedural Areas - Information given) Yes (Inpatient - patient requests chaplain consult to create a medical advance directive) No - patient declined information No - patient declined information    Current Medications (verified) Outpatient Encounter Medications as of 11/23/2021  Medication Sig   albuterol (VENTOLIN HFA) 108 (90 Base) MCG/ACT inhaler Inhale two puffs every 4-6 hours if needed for cough or wheeze   azelastine (ASTELIN) 0.1 % nasal spray Place 1 spray into both nostrils 2 (two) times daily as needed for rhinitis. Use in each nostril as directed   bismuth subsalicylate (PEPTO BISMOL) 262 MG/15ML suspension Take 30 mLs by mouth every 6 (six) hours as needed.   buPROPion (WELLBUTRIN XL) 150 MG 24 hr tablet Take 1 tablet (150 mg total) by mouth daily.   Calcium Carbonate-Vitamin D 600-400 MG-UNIT tablet Take 1 tablet by mouth 2 (two) times daily.   clotrimazole (LOTRIMIN) 1 % cream Apply  topically.   desonide (DESOWEN) 0.05 % cream APPLY TO AFFECTED AREA TWICE A DAY.   dicyclomine (BENTYL) 20 MG tablet TAKE 1 TABLET FOUR TIMES A DAY BEFORE MEALS AND AT BEDTIME   fluticasone (FLONASE) 50 MCG/ACT nasal spray One spray each nostril daily as needed   fluticasone-salmeterol (ADVAIR) 500-50 MCG/ACT AEPB Inhale 1 puff into the lungs in the morning and at bedtime.   hydrOXYzine (ATARAX) 25 MG tablet Take 1 tablet (25 mg total) by mouth 2 (two) times daily as needed.   ketoconazole (NIZORAL) 2 % shampoo Apply 1 application. topically 2 (two) times a week. To affected areas on skin   levothyroxine (SYNTHROID) 125 MCG tablet Take 1 tablet (125 mcg total) by mouth daily.   Multiple Vitamins-Minerals (MULTIVITAMIN WITH MINERALS) tablet Take 1 tablet by mouth daily.   pantoprazole (PROTONIX) 40 MG tablet Take 1 tablet (40 mg total) by mouth 2 (two) times daily.   rizatriptan (MAXALT) 10 MG tablet Take 1 tablet (10 mg total) by mouth as needed for migraine. Reported on 08/12/2015   Spacer/Aero-Holding Chambers (AEROCHAMBER PLUS) inhaler Use as instructed   topiramate (TOPAMAX) 100 MG tablet TAKE 1/2 TABLET IN THE MORNING AND TAKE 1 TABLET AT BEDTIME.   zolpidem (AMBIEN) 10 MG tablet TAKE ONE TABLET BY MOUTH AT BEDTIME   [DISCONTINUED] DIVIGEL 0.25 MG/0.25GM GEL Apply 1 application topically daily. Thigh area (Patient not taking: Reported on 11/09/2021)   [DISCONTINUED] hydrOXYzine (ATARAX) 25  MG tablet Take 1 tablet (25 mg total) by mouth 2 (two) times daily as needed.   [DISCONTINUED] predniSONE (DELTASONE) 50 MG tablet Take 1 tablet daily for 5 days.   No facility-administered encounter medications on file as of 11/23/2021.    Allergies (verified) Sulfa antibiotics, Sulfamethoxazole-trimethoprim, and Metronidazole   History: Past Medical History:  Diagnosis Date   Allergic rhinitis    Arthritis    Asthma    Depression    Goiter    Goiter    Headache(784.0)    Sleep apnea    Past  Surgical History:  Procedure Laterality Date   ABDOMINAL HYSTERECTOMY     CHOLECYSTECTOMY N/A 11/02/2013   Procedure: LAPAROSCOPIC CHOLECYSTECTOMY ;  Surgeon: Cherylynn Ridges, MD;  Location: [facility] OR;  Service: General;  Laterality: N/A;   KNEE SURGERY     THYROIDECTOMY     Family History  Problem Relation Age of Onset   Allergies Mother    Dementia Mother    ALS Father    Prostate cancer Father    Colon cancer Neg Hx    Esophageal cancer Neg Hx    Stomach cancer Neg Hx    Rectal cancer Neg Hx    Social History   Socioeconomic History   Marital status: Married    Spouse name: [name]   Number of children: 4   Years of education: college   Highest education level: Not on file  Occupational History   Occupation: housewife  Tobacco Use   Smoking status: Never    Passive exposure: Yes   Smokeless tobacco: Never   Tobacco comments:    husband smoking cigars  Vaping Use   Vaping Use: Never used  Substance and Sexual Activity   Alcohol use: Not Currently    Comment: occ   Drug use: Never   Sexual activity: Not Currently  Other Topics Concern   Not on file  Social History Narrative   Patient lives at home with her husband [name]).   Retired.   Education college B.S.   Right handed.   Caffeine one cup of coffee daily.   Social Determinants of Health   Financial Resource Strain: Low Risk  (11/23/2021)   Overall Financial Resource Strain (CARDIA)    Difficulty of Paying Living Expenses: Not hard at all  Food Insecurity: No Food Insecurity (11/23/2021)   Hunger Vital Sign    Worried About Running Out of Food in the Last Year: Never true    Ran Out of Food in the Last Year: Never true  Transportation Needs: No Transportation Needs (11/23/2021)   PRAPARE - Administrator, Civil Service (Medical): No    Lack of Transportation (Non-Medical): No  Physical Activity: Inactive (11/23/2021)   Exercise Vital Sign    Days of Exercise per Week: 0 days    Minutes of Exercise  per Session: 0 min  Stress: Stress Concern Present (11/23/2021)   [employer] of Occupational Health - Occupational Stress Questionnaire    Feeling of Stress : To some extent  Social Connections: Moderately Isolated (11/23/2021)   Social Connection and Isolation Panel [NHANES]    Frequency of Communication with Friends and Family: More than three times a week    Frequency of Social Gatherings with Friends and Family: Twice a week    Attends Religious Services: Never    Database administrator or Organizations: No    Attends Banker Meetings: Never    Marital Status: Married  Tobacco Counseling Counseling given: Not Answered Tobacco comments: husband smoking cigars   Clinical Intake:  Pre-visit preparation completed: Yes  Pain : No/denies pain     BMI - recorded: 34.55 Nutritional Status: BMI > 30  Obese Nutritional Risks: None Diabetes: No  How often do you need to have someone help you when you read instructions, pamphlets, or other written materials from your doctor or pharmacy?: 1 - Never  Diabetic?no  Interpreter Needed?: No  Information entered by :: Lanier Ensign, LPN   Activities of Daily Living    11/23/2021   11:22 AM  In your present state of health, do you have any difficulty performing the following activities:  Hearing? 0  Vision? 0  Difficulty concentrating or making decisions? 0  Walking or climbing stairs? 0  Dressing or bathing? 0  Doing errands, shopping? 0  Preparing Food and eating ? N  Using the Toilet? N  In the past six months, have you accidently leaked urine? N  Do you have problems with loss of bowel control? N  Managing your Medications? N  Managing your Finances? N  Housekeeping or managing your Housekeeping? N    Patient Care Team: Ardith Dark, MD as PCP - General (Family Medicine) Nilda Riggs, NP as Nurse Practitioner (Family Medicine) Lucie Leather, Alvira Philips, MD as Consulting Physician (Allergy and  Immunology) Harold Hedge, MD as Consulting Physician (Obstetrics and Gynecology)  Indicate any recent Medical Services you may have received from other than [facility] providers in the past year (date may be approximate).     Assessment:   This is a routine wellness examination for [place].  Hearing/Vision screen Hearing Screening - Comments:: Pt denies any hearing issues  Vision Screening - Comments:: Pt follows up with Dr Dione Booze for annual eye exams   Dietary issues and exercise activities discussed: Current Exercise Habits: The patient does not participate in regular exercise at present   Goals Addressed             This Visit's Progress    Patient Stated       Work with PT with right knee        Depression Screen    11/23/2021   11:19 AM 11/09/2021    8:29 AM 08/31/2021    1:48 PM 03/20/2021    2:39 PM 03/03/2021    2:35 PM 09/26/2020    1:25 PM 09/05/2020    3:00 PM  PHQ 2/9 Scores  PHQ - 2 Score 0 0 2 0 3  1  PHQ- 9 Score   7  9    Exception Documentation      Patient refusal     Fall Risk    11/23/2021   11:21 AM 11/09/2021    8:29 AM 04/13/2021   10:20 AM 03/20/2021    2:38 PM 03/03/2021    2:00 PM  Fall Risk   Falls in the past year? 0 0 0 0 0  Number falls in past yr: 0 0   0  Injury with Fall? 0 0   0  Risk for fall due to : Impaired vision No Fall Risks  No Fall Risks No Fall Risks  Follow up Falls prevention discussed   Falls evaluation completed     FALL RISK PREVENTION PERTAINING TO THE HOME:  Any stairs in or around the home? Yes  If so, are there any without handrails? No  Home free of loose throw rugs in walkways, pet beds, electrical cords, etc?  Yes  Adequate lighting in your home to reduce risk of falls? Yes   ASSISTIVE DEVICES UTILIZED TO PREVENT FALLS:  Life alert? No  Use of a cane, walker or w/c? No  Grab bars in the bathroom? No  Shower chair or bench in shower? Yes  Elevated toilet seat or a handicapped toilet? Yes   TIMED UP AND  GO:  Was the test performed? Yes .  Length of time to ambulate 10 feet: 10 sec.   Gait steady and fast without use of assistive device  Cognitive Function:        09/05/2020    2:48 PM 02/20/2018   10:33 AM  6CIT Screen  What Year? 0 points 0 points  What month? 0 points 0 points  What time?  3 points  Count back from 20 0 points 0 points  Months in reverse 0 points 0 points  Repeat phrase 2 points 0 points  Total Score  3 points    Immunizations Immunization History  Administered Date(s) Administered   Fluad Quad(high Dose 65+) 03/13/2019, 03/03/2021   Influenza Split 02/22/2011   Influenza Whole 04/27/2010   Influenza, High Dose Seasonal PF 05/20/2017   Influenza,inj,Quad PF,6+ Mos 05/16/2015, 02/10/2016   Influenza-Unspecified 04/07/2020   PFIZER(Purple Top)SARS-COV-2 Vaccination 08/15/2019, 09/05/2019, 07/07/2020   Pfizer Covid-19 Vaccine Bivalent Booster 44yrs & up 05/23/2021   Pneumococcal Conjugate-13 06/14/2016   Pneumococcal Polysaccharide-23 06/07/2004   Td 07/12/2006   Tdap 07/12/2006   Zoster, Live 08/05/2011    TDAP status: Due, Education has been provided regarding the importance of this vaccine. Advised may receive this vaccine at local pharmacy or Health Dept. Aware to provide a copy of the vaccination record if obtained from local pharmacy or Health Dept. Verbalized acceptance and understanding.  Flu Vaccine status: Up to date  Pneumococcal vaccine status: Up to date  Covid-19 vaccine status: Completed vaccines  Qualifies for Shingles Vaccine? Yes   Zostavax completed No   Shingrix Completed?: No.    Education has been provided regarding the importance of this vaccine. Patient has been advised to call insurance company to determine out of pocket expense if they have not yet received this vaccine. Advised may also receive vaccine at local pharmacy or Health Dept. Verbalized acceptance and understanding.  Screening Tests Health Maintenance  Topic  Date Due   Zoster Vaccines- Shingrix (1 of 2) 12/01/2021 (Originally 10/07/1969)   DEXA SCAN  09/01/2022 (Originally 10/08/2015)   TETANUS/TDAP  09/01/2022 (Originally 07/12/2016)   Pneumonia Vaccine 48+ Years old (3 - PPSV23 if available, else PCV20) 11/24/2022 (Originally 06/14/2017)   INFLUENZA VACCINE  01/05/2022   MAMMOGRAM  06/16/2022   COLONOSCOPY (Pts 45-28yrs Insurance coverage will need to be confirmed)  08/06/2031   COVID-19 Vaccine  Completed   Hepatitis C Screening  Completed   HPV VACCINES  Aged Out    Health Maintenance  There are no preventive care reminders to display for this patient.   Colorectal cancer screening: Type of screening: Colonoscopy. Completed 08/05/21. Repeat every 10 years  Mammogram status: Completed 06/16/21. Repeat every year  Bone density postponed at this time    Additional Screening:  Hepatitis C Screening:  Completed 02/20/18  Vision Screening: Recommended annual ophthalmology exams for early detection of glaucoma and other disorders of the eye. Is the patient up to date with their annual eye exam?  Yes  Who is the provider or what is the name of the office in which the patient attends annual eye exams? Dr  groat  If pt is not established with a provider, would they like to be referred to a provider to establish care? No .   Dental Screening: Recommended annual dental exams for proper oral hygiene  Community Resource Referral / Chronic Care Management: CRR required this visit?  No   CCM required this visit?  No      Plan:     I have personally reviewed and noted the following in the patient's chart:   Medical and social history Use of alcohol, tobacco or illicit drugs  Current medications and supplements including opioid prescriptions.  Functional ability and status Nutritional status Physical activity Advanced directives List of other physicians Hospitalizations, surgeries, and ER visits in previous 12 months Vitals Screenings to  include cognitive, depression, and falls Referrals and appointments  In addition, I have reviewed and discussed with patient certain preventive protocols, quality metrics, and best practice recommendations. A written personalized care plan for preventive services as well as general preventive health recommendations were provided to patient.     Marzella Schlein, LPN   [id number]   Nurse Notes: None

## 2021-11-30 NOTE — Progress Notes (Signed)
Pt 6CIT was completed on date of appt , when refreshed it didn't register. Pt was in person and completed I updated on 11/26/21. Please Maryclare Bean, LPN

## 2021-12-10 ENCOUNTER — Ambulatory Visit (INDEPENDENT_AMBULATORY_CARE_PROVIDER_SITE_OTHER): Payer: Medicare Other | Admitting: Orthopedic Surgery

## 2021-12-10 DIAGNOSIS — G8929 Other chronic pain: Secondary | ICD-10-CM

## 2021-12-10 DIAGNOSIS — N398 Other specified disorders of urinary system: Secondary | ICD-10-CM | POA: Diagnosis not present

## 2021-12-10 DIAGNOSIS — M25561 Pain in right knee: Secondary | ICD-10-CM | POA: Diagnosis not present

## 2021-12-19 ENCOUNTER — Other Ambulatory Visit: Payer: Self-pay | Admitting: Family Medicine

## 2021-12-22 ENCOUNTER — Other Ambulatory Visit: Payer: Self-pay

## 2021-12-22 ENCOUNTER — Ambulatory Visit (INDEPENDENT_AMBULATORY_CARE_PROVIDER_SITE_OTHER): Payer: Medicare Other | Admitting: Rehabilitative and Restorative Service Providers"

## 2021-12-22 ENCOUNTER — Encounter: Payer: Self-pay | Admitting: Rehabilitative and Restorative Service Providers"

## 2021-12-22 DIAGNOSIS — M25661 Stiffness of right knee, not elsewhere classified: Secondary | ICD-10-CM | POA: Diagnosis not present

## 2021-12-22 DIAGNOSIS — M6281 Muscle weakness (generalized): Secondary | ICD-10-CM | POA: Diagnosis not present

## 2021-12-22 NOTE — Therapy (Signed)
OUTPATIENT PHYSICAL THERAPY LOWER EXTREMITY EVALUATION   Patient Name: Brooke Schneider MRN: [id number] DOB:01-Oct-1950, 71 y.o., female Today's Date: 12/22/2021   PT End of Session - 12/22/21 1307     Visit Number 1    Number of Visits 12    Date for PT Re-Evaluation 02/02/22    Progress Note Due on Visit 10    PT Start Time 0100    PT Stop Time 0148    PT Time Calculation (min) 48 min    Activity Tolerance Patient tolerated treatment well;No increased pain    Behavior During Therapy WFL for tasks assessed/performed             Past Medical History:  Diagnosis Date   Allergic rhinitis    Arthritis    Asthma    Depression    Goiter    Goiter    Headache(784.0)    Sleep apnea    Past Surgical History:  Procedure Laterality Date   ABDOMINAL HYSTERECTOMY     CHOLECYSTECTOMY N/A 11/02/2013   Procedure: LAPAROSCOPIC CHOLECYSTECTOMY ;  Surgeon: Cherylynn Ridges, MD;  Location: [facility] OR;  Service: General;  Laterality: N/A;   KNEE SURGERY     THYROIDECTOMY     Patient Active Problem List   Diagnosis Date Noted   GERD (gastroesophageal reflux disease) 07/17/2019   OA (osteoarthritis) of knee s/p TKA 2021 04/13/2019   MDD (major depressive disorder) 09/13/2016   Tinea versicolor 07/06/2016   IBS (irritable bowel syndrome) 01/02/2016   Hypothyroidism 10/17/2015   Migraine 11/27/2012   Insomnia 07/21/2009   Allergies 01/19/2008   Asthma, allergic 01/19/2008    PCP: Jacquiline Doe, MD  REFERRING PROVIDER: Nadara Mustard, MD  REFERRING DIAG: [id number] (ICD-10-CM) - Chronic pain of right knee  THERAPY DIAG:  Stiffness of right knee, not elsewhere classified  Muscle weakness (generalized)  Rationale for Evaluation and Treatment Rehabilitation  ONSET DATE: 2020  SUBJECTIVE:   SUBJECTIVE STATEMENT: I am still having issues bending my right knee and losing strength in my right thigh.   PERTINENT HISTORY: R TKA 2006; had injections in right knee and was then told  was bone on bone in 2019. R TKR 2020 with manipulation. Pt had prior therapy s/p.   PAIN:  Are you having pain? No  PRECAUTIONS: None  WEIGHT BEARING RESTRICTIONS No  FALLS:  Has patient fallen in last 6 months? No  LIVING ENVIRONMENT: Lives with: lives with their family Lives in: House/apartment   OCCUPATION: retired  PLOF: Independent  PATIENT GOALS get up from low commode   OBJECTIVE:   DIAGNOSTIC FINDINGS: 10/15/21 Xray: 2 view radiographs of the right knee shows a stable total knee  arthroplasty.  There may be some slight lucent lines around the implants.    There is slight movement artifact.  PATIENT SURVEYS:  FOTO 33% function  COGNITION:  Overall cognitive status: Within functional limits for tasks assessed     SENSATION: WFL  EDEMA: None at eval   POSTURE:  kicks leg out to perform STS  PALPATION: Limited patella mobility R, good quad activation R LE with quad set and SLR  LOWER EXTREMITY ROM:  Active ROM Right eval Left eval  Hip flexion    Hip extension    Hip abduction    Hip adduction    Hip internal rotation    Hip external rotation    Knee flexion 70 114  Knee extension 0 0  Ankle dorsiflexion    Ankle plantarflexion  Ankle inversion    Ankle eversion     (Blank rows = not tested)  LOWER EXTREMITY MMT:  MMT Right eval Left eval  Hip flexion 4+/5 3-/5  Hip extension 2-/5 2-/5  Hip abduction 3+/5 4+/5  Hip adduction 4+/5   Hip internal rotation    Hip external rotation    Knee flexion 4-/5 4+/5  Knee extension 4+/5 4+/5  Ankle dorsiflexion 5/5 5/5  Ankle plantarflexion    Ankle inversion    Ankle eversion     (Blank rows = not tested) no ext lag with SLR   GAIT: Distance walked: in gym Assistive device utilized: Single point cane Level of assistance: Complete Independence Comments: uses cane only to get out of the tub, but not to walk. Minimal heel strike R with gait.   TODAY'S TREATMENT: At eval: see pt  education section   PATIENT EDUCATION:  Education details: HEP Person educated: Patient Education method: Explanation, Demonstration, and Handouts Education comprehension: verbalized understanding, returned demonstration, and needs further education   HOME EXERCISE PROGRAM: Access Code: [email or web address] URL: [email or web address] Date: 12/22/2021 Prepared by: Luna Fuse  Exercises - Supine Heel Slide  - 2 x daily - 7 x weekly - 1 sets - 15 reps - Supine Bridge  - 2 x daily - 7 x weekly - 1 sets - 10 reps - Seated Knee Flexion Slide  - 2 x daily - 7 x weekly - 1 sets - 3 reps  ASSESSMENT:  CLINICAL IMPRESSION: Patient is a 71 y.o. female who was seen today for physical therapy evaluation and treatment for R knee weakness and stiffness. She had prior R TKA and TKR with manipulation with last being in 2020. She participated in prior PT with [facility] and no longer performs HEP. She demonstrates decreased R knee flexion but full knee extension and has R LE weakness. Pt is asymmetrical with STS. Pt would benefit from PT for R knee strengthening and ROM to increase her functional mobility.     OBJECTIVE IMPAIRMENTS decreased activity tolerance, decreased mobility, decreased ROM, decreased strength, and impaired flexibility.   ACTIVITY LIMITATIONS bending, sitting, standing, squatting, stairs, transfers, toileting, and locomotion level  PARTICIPATION LIMITATIONS: cleaning and community activity  PERSONAL FACTORS 1 comorbidity: R TKR  are also affecting patient's functional outcome.   REHAB POTENTIAL: Good  CLINICAL DECISION MAKING: Stable/uncomplicated  EVALUATION COMPLEXITY: Low   GOALS: Goals reviewed with patient? Yes  SHORT TERM GOALS: Target date: 01/12/2022  Pt will be indep with HEP to assist with weakness to assist with transfers Baseline: initiated at eval Goal status: INITIAL  2.  Pt will demo improved R knee flexion to 80 degrees to assist with more  normalized STS.  Baseline: 70 degrees Goal status: INITIAL  3.  Pt will be able to perform STS transfers with 25% less difficulty with more equal WB R LE Baseline: unable, kicks R LE out to perform Goal status: INITIAL    LONG TERM GOALS: Target date: 02/02/2022   Pt will demo improved foto to 46% function Baseline: 33% function Goal status: INITIAL  2.  Pt will demo improved R LE strength >/=1/2 MMT grade to assist with walking up/down stairs.  Baseline:  MMT Right eval Left eval  Hip flexion 4+/5 3-/5  Hip extension 2-/5 2-/5  Hip abduction 3+/5 4+/5  Hip adduction 4+/5   Hip internal rotation    Hip external rotation    Knee flexion 4-/5 4+/5  Knee extension 4+/5 4+/5  Ankle dorsiflexion 5/5 5/5   Goal status: INITIAL  3.  Pt will be able to walk up/down stairs reciprocally with 75% less difficulty Baseline: step to with usage of rail and hesitation due to R LE weakness Goal status: INITIAL  4.  Pt will able to STS from a low bed with 75% less difficulty and minimal compensation of R LE Baseline: unable to get up from low bed/toilet  Goal status: INITIAL     PLAN: PT FREQUENCY: 2x/week  PT DURATION: 6 weeks  PLANNED INTERVENTIONS: Therapeutic exercises, Therapeutic activity, Gait training, Patient/Family education, Stair training, Moist heat, Manual therapy, and Re-evaluation  PLAN FOR NEXT SESSION: review HEP, work on R knee flexion, R LE strengthening   America Brown, PT 12/22/2021, 3:24 PM

## 2021-12-24 ENCOUNTER — Other Ambulatory Visit: Payer: Self-pay | Admitting: Family Medicine

## 2021-12-29 ENCOUNTER — Encounter: Payer: Self-pay | Admitting: Orthopedic Surgery

## 2021-12-29 ENCOUNTER — Encounter: Payer: Self-pay | Admitting: Physical Therapy

## 2021-12-29 ENCOUNTER — Ambulatory Visit (INDEPENDENT_AMBULATORY_CARE_PROVIDER_SITE_OTHER): Payer: Medicare Other | Admitting: Physical Therapy

## 2021-12-29 DIAGNOSIS — M6281 Muscle weakness (generalized): Secondary | ICD-10-CM

## 2021-12-29 DIAGNOSIS — M25561 Pain in right knee: Secondary | ICD-10-CM

## 2021-12-29 DIAGNOSIS — M25661 Stiffness of right knee, not elsewhere classified: Secondary | ICD-10-CM | POA: Diagnosis not present

## 2021-12-29 DIAGNOSIS — R600 Localized edema: Secondary | ICD-10-CM

## 2021-12-29 DIAGNOSIS — G8929 Other chronic pain: Secondary | ICD-10-CM

## 2021-12-29 NOTE — Therapy (Signed)
OUTPATIENT PHYSICAL THERAPY TREATMENT NOTE   Patient Name: Brooke Schneider MRN: [id number] DOB:02-18-1951, 71 y.o., female Today's Date: 12/29/2021  PCP: Jacquiline Doe, MD REFERRING PROVIDER: Nadara Mustard, MD  END OF SESSION:   PT End of Session - 12/29/21 1232     Visit Number 2    Number of Visits 12    Date for PT Re-Evaluation 02/02/22    PT Start Time 1145    PT Stop Time 1225    PT Time Calculation (min) 40 min    Activity Tolerance Patient tolerated treatment well;No increased pain    Behavior During Therapy WFL for tasks assessed/performed             Past Medical History:  Diagnosis Date   Allergic rhinitis    Arthritis    Asthma    Depression    Goiter    Goiter    Headache(784.0)    Sleep apnea    Past Surgical History:  Procedure Laterality Date   ABDOMINAL HYSTERECTOMY     CHOLECYSTECTOMY N/A 11/02/2013   Procedure: LAPAROSCOPIC CHOLECYSTECTOMY ;  Surgeon: Cherylynn Ridges, MD;  Location: [facility] OR;  Service: General;  Laterality: N/A;   KNEE SURGERY     THYROIDECTOMY     Patient Active Problem List   Diagnosis Date Noted   GERD (gastroesophageal reflux disease) 07/17/2019   OA (osteoarthritis) of knee s/p TKA 2021 04/13/2019   MDD (major depressive disorder) 09/13/2016   Tinea versicolor 07/06/2016   IBS (irritable bowel syndrome) 01/02/2016   Hypothyroidism 10/17/2015   Migraine 11/27/2012   Insomnia 07/21/2009   Allergies 01/19/2008   Asthma, allergic 01/19/2008    REFERRING DIAG: M25.561,G89.29 (ICD-10-CM) - Chronic pain of right knee  THERAPY DIAG:  Stiffness of right knee, not elsewhere classified  Chronic pain of right knee  Muscle weakness (generalized)  Localized edema  Rationale for Evaluation and Treatment Rehabilitation  PERTINENT HISTORY: R TKA 2006; had injections in right knee and was then told was bone on bone in 2019. R TKR 2020 with manipulation. Pt had prior therapy s/p.   PRECAUTIONS: none  SUBJECTIVE: Pt stating  Rt knee pain is 3/10. Pt stating pain increases to 4/10 when climbing the stairs.   PAIN:  Are you having pain? Yes: NPRS scale: 3/10 Pain location: Rt knee anterior patella with flexion  Pain description: achy, throbbing Aggravating factors: flexion and full extension Relieving factors: resting   OBJECTIVE: (objective measures completed at initial evaluation unless otherwise dated)  DIAGNOSTIC FINDINGS: 10/15/21 Xray: 2 view radiographs of the right knee shows a stable total knee  arthroplasty.  There may be some slight lucent lines around the implants.    There is slight movement artifact.   PATIENT SURVEYS:  12/22/21: FOTO 33% function   COGNITION:           Overall cognitive status: Within functional limits for tasks assessed                          SENSATION: WFL   EDEMA: None at eval (12/22/21)     POSTURE:  kicks leg out to perform STS   PALPATION: 12/22/21: Limited patella mobility R, good quad activation R LE with quad set and SLR   LOWER EXTREMITY ROM:   Active ROM Right Eval 12/22/21 Left Eval 12/22/21 Rt  12/29/21  Hip flexion       Hip extension       Hip abduction  Hip adduction       Hip internal rotation       Hip external rotation       Knee flexion 70 114 74  Knee extension 0 0 0  Ankle dorsiflexion       Ankle plantarflexion       Ankle inversion       Ankle eversion        (Blank rows = not tested)   LOWER EXTREMITY MMT:   MMT Right Eval 12/22/21 Left Eval 12/22/21  Hip flexion 4+/5 3-/5  Hip extension 2-/5 2-/5  Hip abduction 3+/5 4+/5  Hip adduction 4+/5    Hip internal rotation      Hip external rotation      Knee flexion 4-/5 4+/5  Knee extension 4+/5 4+/5  Ankle dorsiflexion 5/5 5/5  Ankle plantarflexion      Ankle inversion      Ankle eversion       (Blank rows = not tested) no ext lag with SLR     GAIT: 12/22/21; Distance walked: in gym Assistive device utilized: Single point cane Level of assistance: Complete  Independence Comments: uses cane only to get out of the tub, but not to walk. Minimal heel strike R with gait.    TODAY'S TREATMENT: 12/29/21:  There Ex: Nustep: Level 5 x 6 minutes Gastro stretch on slant board x 3 holding 20 seconds Leg press: 56#  bilateral LE's 2 x 10, 25# Rt LE only 2 x 10 Fitter First 2 point: rocking med/lateral x 1 minute, front/back x 1 minute c UE support Lunge c foot on 6 inch step x 10 each LE Seated SLR: 2 x 10 Rt LE LAQ: 4 # weights x 15 holding 3 sec Manual:  PROM in supine for knee flexion    12/22/21: At eval: see pt education section     PATIENT EDUCATION:  Education details: HEP Person educated: Patient Education method: Explanation, Demonstration, and Handouts Education comprehension: verbalized understanding, returned demonstration, and needs further education     HOME EXERCISE PROGRAM: Access Code: [email or web address] URL: [email or web address] Date: 12/22/2021 Prepared by: Luna Fuse   Exercises - Supine Heel Slide  - 2 x daily - 7 x weekly - 1 sets - 15 reps - Supine Bridge  - 2 x daily - 7 x weekly - 1 sets - 10 reps - Seated Knee Flexion Slide  - 2 x daily - 7 x weekly - 1 sets - 3 reps   ASSESSMENT:   CLINICAL IMPRESSION: Pt arriving reporting with 3-4/10 pain in her Rt knee with end range flexion and extension. Pt tolerating exercises well given rest breaks as needed due to weakness and muscle fatigue.  We also focused on core stability while performing each exercise. Continue skilled PT to maximize pt's function.       OBJECTIVE IMPAIRMENTS decreased activity tolerance, decreased mobility, decreased ROM, decreased strength, and impaired flexibility.    ACTIVITY LIMITATIONS bending, sitting, standing, squatting, stairs, transfers, toileting, and locomotion level   PARTICIPATION LIMITATIONS: cleaning and community activity   PERSONAL FACTORS 1 comorbidity: R TKR  are also affecting patient's functional outcome.     REHAB POTENTIAL: Good   CLINICAL DECISION MAKING: Stable/uncomplicated   EVALUATION COMPLEXITY: Low     GOALS: Goals reviewed with patient? Yes   SHORT TERM GOALS: Target date: 01/12/2022  Pt will be indep with HEP to assist with weakness to assist with transfers Baseline: initiated at eval Goal status: On-going 12/29/21  2.  Pt will demo improved R knee flexion to 80 degrees to assist with more normalized STS.  Baseline: 70 degrees Goal status: INITIAL   3.  Pt will be able to perform STS transfers with 25% less difficulty with more equal WB R LE Baseline: unable, kicks R LE out to perform Goal status: INITIAL       LONG TERM GOALS: Target date: 02/02/2022    Pt will demo improved foto to 46% function Baseline: 33% function Goal status: INITIAL   2.  Pt will demo improved R LE strength >/=1/2 MMT grade to assist with walking up/down stairs.  Baseline:  MMT Right eval Left eval  Hip flexion 4+/5 3-/5  Hip extension 2-/5 2-/5  Hip abduction 3+/5 4+/5  Hip adduction 4+/5    Hip internal rotation      Hip external rotation      Knee flexion 4-/5 4+/5  Knee extension 4+/5 4+/5  Ankle dorsiflexion 5/5 5/5    Goal status: INITIAL   3.  Pt will be able to walk up/down stairs reciprocally with 75% less difficulty Baseline: step to with usage of rail and hesitation due to R LE weakness Goal status: INITIAL   4.  Pt will able to STS from a low bed with 75% less difficulty and minimal compensation of R LE Baseline: unable to get up from low bed/toilet  Goal status: INITIAL         PLAN: PT FREQUENCY: 2x/week   PT DURATION: 6 weeks   PLANNED INTERVENTIONS: Therapeutic exercises, Therapeutic activity, Gait training, Patient/Family education, Stair training, Moist heat, Manual therapy, and Re-evaluation   PLAN FOR NEXT SESSION:  R knee flexion, R LE strengthening         Oretha Caprice, PT, MPT 12/29/2021, 12:33 PM

## 2021-12-29 NOTE — Progress Notes (Signed)
Office Visit Note   Patient: Brooke Schneider           Date of Birth: 1950/06/27           MRN: [id number] Visit Date: 12/10/2021              Requested by: Ardith Dark, MD [address] [place],  [place] [address] PCP: Ardith Dark, MD  Chief Complaint  Patient presents with   Right Knee - Follow-up      HPI: Patient is a 71 year old woman who is status post a right total knee arthroplasty and status post arthroscopic debridement of adhesions.  Assessment & Plan: Visit Diagnoses:  1. Chronic pain of right knee     Plan: Patient is showing improved range of motion and there is no effusion we will set her up for physical therapy upstairs.  Follow-Up Instructions: Return if symptoms worsen or fail to improve.   Ortho Exam  Patient is alert, oriented, no adenopathy, well-dressed, normal affect, normal respiratory effort. Examination of patient's right knee she has full active extension flexion to 120 degrees there is no effusion no redness or cellulitis.  Radiographs show a stable press-fit right total knee arthroplasty  Imaging: No results found. No images are attached to the encounter.  Labs: Lab Results  Component Value Date   HGBA1C 6.0 08/31/2021   ESRSEDRATE 9 06/11/2014   LABORGA Normal Upper Respiratory Flora 07/21/2015   LABORGA No Beta Hemolytic Streptococci Isolated 07/21/2015     Lab Results  Component Value Date   ALBUMIN 4.3 08/31/2021   ALBUMIN 3.9 09/09/2016   ALBUMIN 4.1 10/17/2015    No results found for: "MG" No results found for: "VD25OH"  No results found for: "PREALBUMIN"    Latest Ref Rng & Units 11/03/2021    4:08 PM 08/31/2021    1:43 PM 04/15/2020    2:35 PM  CBC EXTENDED  WBC 4.0 - 10.5 K/uL 4.5  5.3  5.1   RBC 3.87 - 5.11 MIL/uL 4.91  4.59  4.74   Hemoglobin 12.0 - 15.0 g/dL 34.7  42.5  95.6   HCT 36.0 - 46.0 % 46.4  42.4  42.4   Platelets 150 - 400 K/uL 240  213.0    NEUT# 1.7 - 7.7 K/uL 1.7   1.6   Lymph# 0.7 - 4.0  K/uL 2.1   2.8      There is no height or weight on file to calculate BMI.  Orders:  Orders Placed This Encounter  Procedures   Ambulatory referral to Physical Therapy   No orders of the defined types were placed in this encounter.    Procedures: No procedures performed  Clinical Data: No additional findings.  ROS:  All other systems negative, except as noted in the HPI. Review of Systems  Objective: Vital Signs: There were no vitals taken for this visit.  Specialty Comments:  No specialty comments available.  PMFS History: Patient Active Problem List   Diagnosis Date Noted   GERD (gastroesophageal reflux disease) 07/17/2019   OA (osteoarthritis) of knee s/p TKA 2021 04/13/2019   MDD (major depressive disorder) 09/13/2016   Tinea versicolor 07/06/2016   IBS (irritable bowel syndrome) 01/02/2016   Hypothyroidism 10/17/2015   Migraine 11/27/2012   Insomnia 07/21/2009   Allergies 01/19/2008   Asthma, allergic 01/19/2008   Past Medical History:  Diagnosis Date   Allergic rhinitis    Arthritis    Asthma    Depression  Goiter    Goiter    Headache(784.0)    Sleep apnea     Family History  Problem Relation Age of Onset   Allergies Mother    Dementia Mother    ALS Father    Prostate cancer Father    Colon cancer Neg Hx    Esophageal cancer Neg Hx    Stomach cancer Neg Hx    Rectal cancer Neg Hx     Past Surgical History:  Procedure Laterality Date   ABDOMINAL HYSTERECTOMY     CHOLECYSTECTOMY N/A 11/02/2013   Procedure: LAPAROSCOPIC CHOLECYSTECTOMY ;  Surgeon: Cherylynn Ridges, MD;  Location: [facility] OR;  Service: General;  Laterality: N/A;   KNEE SURGERY     THYROIDECTOMY     Social History   Occupational History   Occupation: housewife  Tobacco Use   Smoking status: Never    Passive exposure: Yes   Smokeless tobacco: Never   Tobacco comments:    husband smoking cigars  Vaping Use   Vaping Use: Never used  Substance and Sexual Activity    Alcohol use: Not Currently    Comment: occ   Drug use: Never   Sexual activity: Not Currently

## 2022-01-01 ENCOUNTER — Ambulatory Visit (INDEPENDENT_AMBULATORY_CARE_PROVIDER_SITE_OTHER): Payer: Medicare Other | Admitting: Rehabilitative and Restorative Service Providers"

## 2022-01-01 ENCOUNTER — Encounter: Payer: Self-pay | Admitting: Rehabilitative and Restorative Service Providers"

## 2022-01-01 DIAGNOSIS — M25561 Pain in right knee: Secondary | ICD-10-CM | POA: Diagnosis not present

## 2022-01-01 DIAGNOSIS — G8929 Other chronic pain: Secondary | ICD-10-CM | POA: Diagnosis not present

## 2022-01-01 DIAGNOSIS — M25661 Stiffness of right knee, not elsewhere classified: Secondary | ICD-10-CM

## 2022-01-01 DIAGNOSIS — R600 Localized edema: Secondary | ICD-10-CM | POA: Diagnosis not present

## 2022-01-01 DIAGNOSIS — M6281 Muscle weakness (generalized): Secondary | ICD-10-CM | POA: Diagnosis not present

## 2022-01-01 NOTE — Therapy (Signed)
OUTPATIENT PHYSICAL THERAPY TREATMENT NOTE   Patient Name: Brooke Schneider MRN: [id number] DOB:07-27-1950, 71 y.o., female Today's Date: 01/01/2022  PCP: Brooke Doe, MD REFERRING PROVIDER: Nadara Mustard, MD  END OF SESSION:   PT End of Session - 01/01/22 1058     Visit Number 3    Number of Visits 12    Date for PT Re-Evaluation 02/02/22    PT Start Time 1052    PT Stop Time 1138    PT Time Calculation (min) 46 min    Activity Tolerance Patient tolerated treatment well;No increased pain    Behavior During Therapy WFL for tasks assessed/performed            Past Medical History:  Diagnosis Date   Allergic rhinitis    Arthritis    Asthma    Depression    Goiter    Goiter    Headache(784.0)    Sleep apnea    Past Surgical History:  Procedure Laterality Date   ABDOMINAL HYSTERECTOMY     CHOLECYSTECTOMY N/A 11/02/2013   Procedure: LAPAROSCOPIC CHOLECYSTECTOMY ;  Surgeon: Brooke Ridges, MD;  Location: [facility] OR;  Service: General;  Laterality: N/A;   KNEE SURGERY     THYROIDECTOMY     Patient Active Problem List   Diagnosis Date Noted   GERD (gastroesophageal reflux disease) 07/17/2019   OA (osteoarthritis) of knee s/p TKA 2021 04/13/2019   MDD (major depressive disorder) 09/13/2016   Tinea versicolor 07/06/2016   IBS (irritable bowel syndrome) 01/02/2016   Hypothyroidism 10/17/2015   Migraine 11/27/2012   Insomnia 07/21/2009   Allergies 01/19/2008   Asthma, allergic 01/19/2008    REFERRING DIAG: M25.561,G89.29 (ICD-10-CM) - Chronic pain of right knee  THERAPY DIAG:  Stiffness of right knee, not elsewhere classified  Chronic pain of right knee  Muscle weakness (generalized)  Localized edema  Rationale for Evaluation and Treatment Rehabilitation  PERTINENT HISTORY: R TKA 2006; had injections in right knee and was then told was bone on bone in 2019. R TKR 2020 with manipulation. Pt had prior therapy s/p.   PRECAUTIONS: none  SUBJECTIVE: R knee pain  is not a huge problem.  She is most concerned with her flexion AROM, edema and strength.  PAIN:  Are you having pain? Yes: NPRS scale: 2-4/10 this week on a 10/10 Pain location: Rt knee anterior patella with flexion  Pain description: achy, throbbing Aggravating factors: flexion and full extension Relieving factors: resting   OBJECTIVE: (objective measures completed at initial evaluation unless otherwise dated)  DIAGNOSTIC FINDINGS: 10/15/21 Xray: 2 view radiographs of the right knee shows a stable total knee  arthroplasty.  There may be some slight lucent lines around the implants.    There is slight movement artifact.   PATIENT SURVEYS:  12/22/21: FOTO 33% function   COGNITION:           Overall cognitive status: Within functional limits for tasks assessed                          SENSATION: WFL   EDEMA: None at eval (12/22/21)     POSTURE:  kicks leg out to perform STS   PALPATION: 12/22/21: Limited patella mobility R, good quad activation R LE with quad set and SLR   LOWER EXTREMITY ROM:   Active ROM Right Eval 12/22/21 Left Eval 12/22/21 Rt  12/29/21  Hip flexion       Hip extension  Hip abduction       Hip adduction       Hip internal rotation       Hip external rotation       Knee flexion 70 114 74  Knee extension 0 0 0  Ankle dorsiflexion       Ankle plantarflexion       Ankle inversion       Ankle eversion        (Blank rows = not tested)   LOWER EXTREMITY MMT:   MMT Right Eval 12/22/21 Left Eval 12/22/21  Hip flexion 4+/5 3-/5  Hip extension 2-/5 2-/5  Hip abduction 3+/5 4+/5  Hip adduction 4+/5    Hip internal rotation      Hip external rotation      Knee flexion 4-/5 4+/5  Knee extension 4+/5 4+/5  Ankle dorsiflexion 5/5 5/5  Ankle plantarflexion      Ankle inversion      Ankle eversion       (Blank rows = not tested) no ext lag with SLR     GAIT: 12/22/21; Distance walked: in gym Assistive device utilized: Single point cane Level  of assistance: Complete Independence Comments: uses cane only to get out of the tub, but not to walk. Minimal heel strike R with gait.    TODAY'S TREATMENT: 01/01/2022: Recumbent bike Seat 5 for 5 minutes AAROM Seated SLR 3 sets of 5 slow eccentrics  Functional Activities (for sit to stand, stairs, curbs): B leg press 75# & 87# 15X each slow eccentrics Sit to stand slow eccentrics 2 sets of 5  Step-down off 4 inch step using hands PRN 2 sets of 10 B   12/29/21:  There Ex: Nustep: Level 5 x 6 minutes Gastro stretch on slant board x 3 holding 20 seconds Leg press: 56#  bilateral LE's 2 x 10, 25# Rt LE only 2 x 10 Fitter First 2 point: rocking med/lateral x 1 minute, front/back x 1 minute c UE support Lunge c foot on 6 inch step x 10 each LE Seated SLR: 2 x 10 Rt LE LAQ: 4 # weights x 15 holding 3 sec Manual:  PROM in supine for knee flexion   12/22/21: At eval: see pt education section     PATIENT EDUCATION:  Education details: HEP Person educated: Patient Education method: Explanation, Demonstration, and Handouts Education comprehension: verbalized understanding, returned demonstration, and needs further education     HOME EXERCISE PROGRAM: Access Code: [email or web address] URL: [email or web address] Date: 01/01/2022 Prepared by: Brooke Schneider  Exercises - Seated Straight Leg Raise  - 2 x daily - 7 x weekly - 3-5 sets - 5 reps - 3 seconds hold - Sit to Stand with Armchair  - 2 x daily - 7 x weekly - 1 sets - 5 reps   ASSESSMENT:   CLINICAL IMPRESSION: Brooke Schneider is very interested in improving her R knee flexion AROM, strength and function.  We focused on these areas today with strength and functional gains being the most likely to occur.  Flexion AROM can improve, and has shown early objective progress.  Her time post-TKA makes this a more difficult ask.  Continue current POC to meet LTGs.    OBJECTIVE IMPAIRMENTS decreased activity tolerance, decreased mobility,  decreased ROM, decreased strength, and impaired flexibility.    ACTIVITY LIMITATIONS bending, sitting, standing, squatting, stairs, transfers, toileting, and locomotion level   PARTICIPATION LIMITATIONS: cleaning and community activity   PERSONAL FACTORS 1 comorbidity: R TKR  are also  affecting patient's functional outcome.    REHAB POTENTIAL: Good   CLINICAL DECISION MAKING: Stable/uncomplicated   EVALUATION COMPLEXITY: Low     GOALS: Goals reviewed with patient? Yes   SHORT TERM GOALS: Target date: 01/12/2022  Pt will be indep with HEP to assist with weakness to assist with transfers Baseline: initiated at eval Goal status: On-going 12/29/21   2.  Pt will demo improved R knee flexion to 80 degrees to assist with more normalized STS.  Baseline: 70 degrees Goal status: INITIAL   3.  Pt will be able to perform STS transfers with 25% less difficulty with more equal WB R LE Baseline: unable, kicks R LE out to perform Goal status: INITIAL       LONG TERM GOALS: Target date: 02/02/2022    Pt will demo improved foto to 46% function Baseline: 33% function Goal status: INITIAL   2.  Pt will demo improved R LE strength >/=1/2 MMT grade to assist with walking up/down stairs.  Baseline:  MMT Right eval Left eval  Hip flexion 4+/5 3-/5  Hip extension 2-/5 2-/5  Hip abduction 3+/5 4+/5  Hip adduction 4+/5    Hip internal rotation      Hip external rotation      Knee flexion 4-/5 4+/5  Knee extension 4+/5 4+/5  Ankle dorsiflexion 5/5 5/5    Goal status: INITIAL   3.  Pt will be able to walk up/down stairs reciprocally with 75% less difficulty Baseline: step to with usage of rail and hesitation due to R LE weakness Goal status: INITIAL   4.  Pt will able to STS from a low bed with 75% less difficulty and minimal compensation of R LE Baseline: unable to get up from low bed/toilet  Goal status: INITIAL         PLAN: PT FREQUENCY: 2x/week   PT DURATION: 6 weeks    PLANNED INTERVENTIONS: Therapeutic exercises, Therapeutic activity, Gait training, Patient/Family education, Stair training, Moist heat, Manual therapy, and Re-evaluation   PLAN FOR NEXT SESSION:  R knee flexion AROM, R quadriceps strengthening and functional (sit to stand, stairs) progressions         Farley Ly, PT, MPT 01/01/2022, 1:43 PM

## 2022-01-04 ENCOUNTER — Telehealth: Payer: Self-pay | Admitting: Physical Therapy

## 2022-01-04 ENCOUNTER — Encounter: Payer: Medicare Other | Admitting: Physical Therapy

## 2022-01-04 NOTE — Telephone Encounter (Signed)
Pt did not show for PT appointment today. They were contacted and informed by phone and she states she forgot to put it on her calender. They were provided the date and time of their next appointment.  Ivery Quale, PT, DPT 01/04/22 2:52 PM

## 2022-01-06 ENCOUNTER — Encounter: Payer: Medicare Other | Admitting: Physical Therapy

## 2022-01-11 ENCOUNTER — Encounter: Payer: Self-pay | Admitting: Physical Therapy

## 2022-01-11 ENCOUNTER — Ambulatory Visit (INDEPENDENT_AMBULATORY_CARE_PROVIDER_SITE_OTHER): Payer: Medicare Other | Admitting: Physical Therapy

## 2022-01-11 DIAGNOSIS — G8929 Other chronic pain: Secondary | ICD-10-CM

## 2022-01-11 DIAGNOSIS — M25661 Stiffness of right knee, not elsewhere classified: Secondary | ICD-10-CM

## 2022-01-11 DIAGNOSIS — M6281 Muscle weakness (generalized): Secondary | ICD-10-CM

## 2022-01-11 DIAGNOSIS — R600 Localized edema: Secondary | ICD-10-CM

## 2022-01-11 DIAGNOSIS — M25561 Pain in right knee: Secondary | ICD-10-CM | POA: Diagnosis not present

## 2022-01-11 NOTE — Therapy (Signed)
OUTPATIENT PHYSICAL THERAPY TREATMENT NOTE   Patient Name: Brooke Schneider MRN: [id number] DOB:[birth year], 71 y.o., female Today's Date: 01/11/2022  PCP: Jacquiline Doe, MD REFERRING PROVIDER: Nadara Mustard, MD  END OF SESSION:   PT End of Session - 01/11/22 1420     Visit Number 4    Number of Visits 12    Date for PT Re-Evaluation 02/02/22    PT Start Time 1425    PT Stop Time 1510    PT Time Calculation (min) 45 min    Activity Tolerance Patient tolerated treatment well;No increased pain    Behavior During Therapy WFL for tasks assessed/performed            Past Medical History:  Diagnosis Date   Allergic rhinitis    Arthritis    Asthma    Depression    Goiter    Goiter    Headache(784.0)    Sleep apnea    Past Surgical History:  Procedure Laterality Date   ABDOMINAL HYSTERECTOMY     CHOLECYSTECTOMY N/A 11/02/2013   Procedure: LAPAROSCOPIC CHOLECYSTECTOMY ;  Surgeon: Cherylynn Ridges, MD;  Location: [facility] OR;  Service: General;  Laterality: N/A;   KNEE SURGERY     THYROIDECTOMY     Patient Active Problem List   Diagnosis Date Noted   GERD (gastroesophageal reflux disease) 07/17/2019   OA (osteoarthritis) of knee s/p TKA 2021 04/13/2019   MDD (major depressive disorder) 09/13/2016   Tinea versicolor 07/06/2016   IBS (irritable bowel syndrome) 01/02/2016   Hypothyroidism 10/17/2015   Migraine 11/27/2012   Insomnia 07/21/2009   Allergies 01/19/2008   Asthma, allergic 01/19/2008    REFERRING DIAG: M25.561,G89.29 (ICD-10-CM) - Chronic pain of right knee  THERAPY DIAG:  Stiffness of right knee, not elsewhere classified  Chronic pain of right knee  Muscle weakness (generalized)  Localized edema  Rationale for Evaluation and Treatment Rehabilitation  PERTINENT HISTORY: R TKA 2006; had injections in right knee and was then told was bone on bone in 2019. R TKR 2020 with manipulation. Pt had prior therapy s/p.   PRECAUTIONS: none  SUBJECTIVE: R knee pain is  not a huge problem.  She is most concerned with her flexion AROM, edema and strength.  PAIN:  Are you having pain? Yes: NPRS scale: currently 3-4/10 Pain location: Rt knee anterior patella with flexion  Pain description: achy, throbbing Aggravating factors: flexion and full extension Relieving factors: resting   OBJECTIVE: (objective measures completed at initial evaluation unless otherwise dated)  DIAGNOSTIC FINDINGS: 10/15/21 Xray: 2 view radiographs of the right knee shows a stable total knee  arthroplasty.  There may be some slight lucent lines around the implants.    There is slight movement artifact.   PATIENT SURVEYS:  12/22/21: FOTO 33% function   COGNITION:           Overall cognitive status: Within functional limits for tasks assessed                          SENSATION: WFL   EDEMA: None at eval (12/22/21)     POSTURE:  kicks leg out to perform STS   PALPATION: 12/22/21: Limited patella mobility R, good quad activation R LE with quad set and SLR   LOWER EXTREMITY ROM:   Active ROM Right Eval 12/22/21 Left Eval 12/22/21 Rt  12/29/21  Hip flexion       Hip extension       Hip  abduction       Hip adduction       Hip internal rotation       Hip external rotation       Knee flexion 70 114 74  Knee extension 0 0 0  Ankle dorsiflexion       Ankle plantarflexion       Ankle inversion       Ankle eversion        (Blank rows = not tested)   LOWER EXTREMITY MMT:   MMT Right Eval 12/22/21 Left Eval 12/22/21  Hip flexion 4+/5 3-/5  Hip extension 2-/5 2-/5  Hip abduction 3+/5 4+/5  Hip adduction 4+/5    Hip internal rotation      Hip external rotation      Knee flexion 4-/5 4+/5  Knee extension 4+/5 4+/5  Ankle dorsiflexion 5/5 5/5  Ankle plantarflexion      Ankle inversion      Ankle eversion       (Blank rows = not tested) no ext lag with SLR     GAIT: 12/22/21; Distance walked: in gym Assistive device utilized: Single point cane Level of  assistance: Complete Independence Comments: uses cane only to get out of the tub, but not to walk. Minimal heel strike R with gait.    TODAY'S TREATMENT: 01/11/22 -Nu step L6 X 8 min working on flexion ROM -Standing lunge stretch for Rt knee flexion 10 sec X10 -Seated knee flexion stretch AAROM 5 sec X15 -Seated LAQ 5# X15, then X 10 bilat  Functional Activities (for sit to stand, stairs, curbs): -B leg press 87# 20X each slow eccentrics, then Rt leg only 37# 37# X20 -Sit to stand slow eccentrics 2 sets of 5  -Step-down off 4 inch step using one UE support 10 B  01/01/2022: Recumbent bike Seat 5 for 5 minutes AAROM Seated SLR 3 sets of 5 slow eccentrics  Functional Activities (for sit to stand, stairs, curbs): B leg press 75# & 87# 15X each slow eccentrics Sit to stand slow eccentrics 2 sets of 5  Step-down off 4 inch step using hands PRN 2 sets of 10 B   12/29/21:  There Ex: Nustep: Level 5 x 6 minutes Gastro stretch on slant board x 3 holding 20 seconds Leg press: 56#  bilateral LE's 2 x 10, 25# Rt LE only 2 x 10 Fitter First 2 point: rocking med/lateral x 1 minute, front/back x 1 minute c UE support Lunge c foot on 6 inch step x 10 each LE Seated SLR: 2 x 10 Rt LE LAQ: 4 # weights x 15 holding 3 sec Manual:  PROM in supine for knee flexion   12/22/21: At eval: see pt education section     PATIENT EDUCATION:  Education details: HEP Person educated: Patient Education method: Explanation, Demonstration, and Handouts Education comprehension: verbalized understanding, returned demonstration, and needs further education     HOME EXERCISE PROGRAM: Access Code: [email or web address] URL: [email or web address] Date: 01/01/2022 Prepared by: Pauletta Browns  Exercises - Seated Straight Leg Raise  - 2 x daily - 7 x weekly - 3-5 sets - 5 reps - 3 seconds hold - Sit to Stand with Armchair  - 2 x daily - 7 x weekly - 1 sets - 5 reps   ASSESSMENT:   CLINICAL  IMPRESSION: Session focused on improving knee strength and ROM to her tolerance. PT recommending to continue with current plan of care.    OBJECTIVE IMPAIRMENTS decreased activity tolerance, decreased  mobility, decreased ROM, decreased strength, and impaired flexibility.    ACTIVITY LIMITATIONS bending, sitting, standing, squatting, stairs, transfers, toileting, and locomotion level   PARTICIPATION LIMITATIONS: cleaning and community activity   PERSONAL FACTORS 1 comorbidity: R TKR  are also affecting patient's functional outcome.    REHAB POTENTIAL: Good   CLINICAL DECISION MAKING: Stable/uncomplicated   EVALUATION COMPLEXITY: Low     GOALS: Goals reviewed with patient? Yes   SHORT TERM GOALS: Target date: 01/12/2022  Pt will be indep with HEP to assist with weakness to assist with transfers Baseline: initiated at eval Goal status: On-going 12/29/21   2.  Pt will demo improved R knee flexion to 80 degrees to assist with more normalized STS.  Baseline: 70 degrees Goal status: INITIAL   3.  Pt will be able to perform STS transfers with 25% less difficulty with more equal WB R LE Baseline: unable, kicks R LE out to perform Goal status: INITIAL       LONG TERM GOALS: Target date: 02/02/2022    Pt will demo improved foto to 46% function Baseline: 33% function Goal status: INITIAL   2.  Pt will demo improved R LE strength >/=1/2 MMT grade to assist with walking up/down stairs.  Baseline:  MMT Right eval Left eval  Hip flexion 4+/5 3-/5  Hip extension 2-/5 2-/5  Hip abduction 3+/5 4+/5  Hip adduction 4+/5    Hip internal rotation      Hip external rotation      Knee flexion 4-/5 4+/5  Knee extension 4+/5 4+/5  Ankle dorsiflexion 5/5 5/5    Goal status: INITIAL   3.  Pt will be able to walk up/down stairs reciprocally with 75% less difficulty Baseline: step to with usage of rail and hesitation due to R LE weakness Goal status: INITIAL   4.  Pt will able to STS  from a low bed with 75% less difficulty and minimal compensation of R LE Baseline: unable to get up from low bed/toilet  Goal status: INITIAL         PLAN: PT FREQUENCY: 2x/week   PT DURATION: 6 weeks   PLANNED INTERVENTIONS: Therapeutic exercises, Therapeutic activity, Gait training, Patient/Family education, Stair training, Moist heat, Manual therapy, and Re-evaluation   PLAN FOR NEXT SESSION:  R knee flexion AROM, R quadriceps strengthening and functional (sit to stand, stairs) progressions         April Manson, PT, DPT 01/11/2022, 2:21 PM

## 2022-01-12 ENCOUNTER — Telehealth: Payer: Self-pay | Admitting: Family Medicine

## 2022-01-12 NOTE — Telephone Encounter (Signed)
Patient would like to know if she is eligible for shingles vaccine-

## 2022-01-13 ENCOUNTER — Ambulatory Visit (INDEPENDENT_AMBULATORY_CARE_PROVIDER_SITE_OTHER): Payer: Medicare Other | Admitting: Physical Therapy

## 2022-01-13 ENCOUNTER — Encounter: Payer: Self-pay | Admitting: Physical Therapy

## 2022-01-13 DIAGNOSIS — M6281 Muscle weakness (generalized): Secondary | ICD-10-CM

## 2022-01-13 DIAGNOSIS — M25561 Pain in right knee: Secondary | ICD-10-CM

## 2022-01-13 DIAGNOSIS — M25661 Stiffness of right knee, not elsewhere classified: Secondary | ICD-10-CM

## 2022-01-13 DIAGNOSIS — R600 Localized edema: Secondary | ICD-10-CM

## 2022-01-13 DIAGNOSIS — G8929 Other chronic pain: Secondary | ICD-10-CM | POA: Diagnosis not present

## 2022-01-13 NOTE — Telephone Encounter (Signed)
Left voice message  Need second shingles vaccine, can go to pharmacy of choice  Let us know after getting vaccine to update your immunization records

## 2022-01-13 NOTE — Therapy (Signed)
OUTPATIENT PHYSICAL THERAPY TREATMENT NOTE   Patient Name: Brooke Schneider MRN: [id number] DOB:1950/06/30, 71 y.o., female Today's Date: 01/13/2022  PCP: Jacquiline Doe, MD REFERRING PROVIDER: Nadara Mustard, MD  END OF SESSION:   PT End of Session - 01/13/22 1439     Visit Number 5    Number of Visits 12    Date for PT Re-Evaluation 02/02/22    Progress Note Due on Visit 10    PT Start Time 1435    PT Stop Time 1515    PT Time Calculation (min) 40 min    Activity Tolerance Patient tolerated treatment well;No increased pain    Behavior During Therapy WFL for tasks assessed/performed            Past Medical History:  Diagnosis Date   Allergic rhinitis    Arthritis    Asthma    Depression    Goiter    Goiter    Headache(784.0)    Sleep apnea    Past Surgical History:  Procedure Laterality Date   ABDOMINAL HYSTERECTOMY     CHOLECYSTECTOMY N/A 11/02/2013   Procedure: LAPAROSCOPIC CHOLECYSTECTOMY ;  Surgeon: Cherylynn Ridges, MD;  Location: [facility] OR;  Service: General;  Laterality: N/A;   KNEE SURGERY     THYROIDECTOMY     Patient Active Problem List   Diagnosis Date Noted   GERD (gastroesophageal reflux disease) 07/17/2019   OA (osteoarthritis) of knee s/p TKA 2021 04/13/2019   MDD (major depressive disorder) 09/13/2016   Tinea versicolor 07/06/2016   IBS (irritable bowel syndrome) 01/02/2016   Hypothyroidism 10/17/2015   Migraine 11/27/2012   Insomnia 07/21/2009   Allergies 01/19/2008   Asthma, allergic 01/19/2008    REFERRING DIAG: M25.561,G89.29 (ICD-10-CM) - Chronic pain of right knee  THERAPY DIAG:  Stiffness of right knee, not elsewhere classified  Chronic pain of right knee  Muscle weakness (generalized)  Localized edema  Rationale for Evaluation and Treatment Rehabilitation  PERTINENT HISTORY: R TKA 2006; had injections in right knee and was then told was bone on bone in 2019. R TKR 2020 with manipulation. Pt had prior therapy s/p.   PRECAUTIONS:  none  SUBJECTIVE: says she is having some pain but does not feel she was over worked in PT. PAIN:  Are you having pain? Yes: NPRS scale: currently 3/10 Pain location: Rt knee anterior patella with flexion  Pain description: achy, throbbing Aggravating factors: flexion and full extension Relieving factors: resting   OBJECTIVE: (objective measures completed at initial evaluation unless otherwise dated)  DIAGNOSTIC FINDINGS: 10/15/21 Xray: 2 view radiographs of the right knee shows a stable total knee  arthroplasty.  There may be some slight lucent lines around the implants.    There is slight movement artifact.   PATIENT SURVEYS:  12/22/21: FOTO 33% function   COGNITION:           Overall cognitive status: Within functional limits for tasks assessed                          SENSATION: WFL   EDEMA: None at eval (12/22/21)     POSTURE:  kicks leg out to perform STS   PALPATION: 12/22/21: Limited patella mobility R, good quad activation R LE with quad set and SLR   LOWER EXTREMITY ROM:   Active ROM Right Eval 12/22/21 Left Eval 12/22/21 Rt  12/29/21  Hip flexion       Hip extension  Hip abduction       Hip adduction       Hip internal rotation       Hip external rotation       Knee flexion 70 114 74  Knee extension 0 0 0  Ankle dorsiflexion       Ankle plantarflexion       Ankle inversion       Ankle eversion        (Blank rows = not tested)   LOWER EXTREMITY MMT:   MMT Right Eval 12/22/21 Left Eval 12/22/21  Hip flexion 4+/5 3-/5  Hip extension 2-/5 2-/5  Hip abduction 3+/5 4+/5  Hip adduction 4+/5    Hip internal rotation      Hip external rotation      Knee flexion 4-/5 4+/5  Knee extension 4+/5 4+/5  Ankle dorsiflexion 5/5 5/5  Ankle plantarflexion      Ankle inversion      Ankle eversion       (Blank rows = not tested) no ext lag with SLR     GAIT: 12/22/21; Distance walked: in gym Assistive device utilized: Single point cane Level of  assistance: Complete Independence Comments: uses cane only to get out of the tub, but not to walk. Minimal heel strike R with gait.    TODAY'S TREATMENT: 01/13/22 -Nu step L6 X 8 min working on flexion ROM -Standing lunge stretch forknee flexion 5 sec X15 -Seated knee flexion stretch AAROM 10 sec X10 -Seated LAQ 5# 2X15 bilat  Functional Activities (for sit to stand, stairs, curbs): -B leg press 87# 20X each slow eccentrics, then SL only 37# X 15 each side -Sit to stand slow eccentrics 2 sets of 5  -Step-down off 4 inch step using one UE support 10 B  01/11/22 -Nu step L6 X 8 min working on flexion ROM -Standing lunge stretch for Rt knee flexion 10 sec X10 -Seated knee flexion stretch AAROM 5 sec X15 -Seated LAQ 5# X15, then X 10 bilat  Functional Activities (for sit to stand, stairs, curbs): -B leg press 87# 20X each slow eccentrics, then Rt leg only 37# 37# X20 -Sit to stand slow eccentrics 2 sets of 5  -Step-down off 4 inch step using one UE support 10 B  01/01/2022: Recumbent bike Seat 5 for 5 minutes AAROM Seated SLR 3 sets of 5 slow eccentrics  Functional Activities (for sit to stand, stairs, curbs): B leg press 75# & 87# 15X each slow eccentrics Sit to stand slow eccentrics 2 sets of 5  Step-down off 4 inch step using hands PRN 2 sets of 10 B   12/29/21:  There Ex: Nustep: Level 5 x 6 minutes Gastro stretch on slant board x 3 holding 20 seconds Leg press: 56#  bilateral LE's 2 x 10, 25# Rt LE only 2 x 10 Fitter First 2 point: rocking med/lateral x 1 minute, front/back x 1 minute c UE support Lunge c foot on 6 inch step x 10 each LE Seated SLR: 2 x 10 Rt LE LAQ: 4 # weights x 15 holding 3 sec Manual:  PROM in supine for knee flexion   12/22/21: At eval: see pt education section     PATIENT EDUCATION:  Education details: HEP Person educated: Patient Education method: Explanation, Demonstration, and Handouts Education comprehension: verbalized understanding,  returned demonstration, and needs further education     HOME EXERCISE PROGRAM: Access Code: [email or web address] URL: [email or web address] Date: 01/01/2022 Prepared by: Vista Mink  Exercises -  Seated Straight Leg Raise  - 2 x daily - 7 x weekly - 3-5 sets - 5 reps - 3 seconds hold - Sit to Stand with Armchair  - 2 x daily - 7 x weekly - 1 sets - 5 reps   ASSESSMENT:   CLINICAL IMPRESSION: She is improving with strength but still limited some with Rt knee flexion ROM and we will work to continue to improve this with PT.     OBJECTIVE IMPAIRMENTS decreased activity tolerance, decreased mobility, decreased ROM, decreased strength, and impaired flexibility.    ACTIVITY LIMITATIONS bending, sitting, standing, squatting, stairs, transfers, toileting, and locomotion level   PARTICIPATION LIMITATIONS: cleaning and community activity   PERSONAL FACTORS 1 comorbidity: R TKR  are also affecting patient's functional outcome.    REHAB POTENTIAL: Good   CLINICAL DECISION MAKING: Stable/uncomplicated   EVALUATION COMPLEXITY: Low     GOALS: Goals reviewed with patient? Yes   SHORT TERM GOALS: Target date: 01/12/2022  Pt will be indep with HEP to assist with weakness to assist with transfers Baseline: initiated at eval Goal status: On-going 12/29/21   2.  Pt will demo improved R knee flexion to 80 degrees to assist with more normalized STS.  Baseline: 70 degrees Goal status: INITIAL   3.  Pt will be able to perform STS transfers with 25% less difficulty with more equal WB R LE Baseline: unable, kicks R LE out to perform Goal status: INITIAL       LONG TERM GOALS: Target date: 02/02/2022    Pt will demo improved foto to 46% function Baseline: 33% function Goal status: INITIAL   2.  Pt will demo improved R LE strength >/=1/2 MMT grade to assist with walking up/down stairs.  Baseline:  MMT Right eval Left eval  Hip flexion 4+/5 3-/5  Hip extension 2-/5 2-/5  Hip  abduction 3+/5 4+/5  Hip adduction 4+/5    Hip internal rotation      Hip external rotation      Knee flexion 4-/5 4+/5  Knee extension 4+/5 4+/5  Ankle dorsiflexion 5/5 5/5    Goal status: INITIAL   3.  Pt will be able to walk up/down stairs reciprocally with 75% less difficulty Baseline: step to with usage of rail and hesitation due to R LE weakness Goal status: INITIAL   4.  Pt will able to STS from a low bed with 75% less difficulty and minimal compensation of R LE Baseline: unable to get up from low bed/toilet  Goal status: INITIAL         PLAN: PT FREQUENCY: 2x/week   PT DURATION: 6 weeks   PLANNED INTERVENTIONS: Therapeutic exercises, Therapeutic activity, Gait training, Patient/Family education, Stair training, Moist heat, Manual therapy, and Re-evaluation   PLAN FOR NEXT SESSION:  R knee flexion AROM, R quadriceps strengthening and functional (sit to stand, stairs) progressions         April Manson, PT, DPT 01/13/2022, 2:40 PM

## 2022-01-14 NOTE — Telephone Encounter (Signed)
Patient has called back. I have given her response.

## 2022-01-18 ENCOUNTER — Encounter: Payer: Self-pay | Admitting: Physical Therapy

## 2022-01-18 ENCOUNTER — Ambulatory Visit (INDEPENDENT_AMBULATORY_CARE_PROVIDER_SITE_OTHER): Payer: Medicare Other | Admitting: Physical Therapy

## 2022-01-18 DIAGNOSIS — G8929 Other chronic pain: Secondary | ICD-10-CM

## 2022-01-18 DIAGNOSIS — R600 Localized edema: Secondary | ICD-10-CM | POA: Diagnosis not present

## 2022-01-18 DIAGNOSIS — M25661 Stiffness of right knee, not elsewhere classified: Secondary | ICD-10-CM | POA: Diagnosis not present

## 2022-01-18 DIAGNOSIS — M25561 Pain in right knee: Secondary | ICD-10-CM

## 2022-01-18 DIAGNOSIS — M6281 Muscle weakness (generalized): Secondary | ICD-10-CM

## 2022-01-18 NOTE — Therapy (Signed)
OUTPATIENT PHYSICAL THERAPY TREATMENT NOTE   Patient Name: Brooke Schneider MRN: [id number] DOB:1951-04-16, 71 y.o., female Today's Date: 01/18/2022  PCP: Jacquiline Doe, MD REFERRING PROVIDER: Nadara Mustard, MD  END OF SESSION:   PT End of Session - 01/18/22 1434     Visit Number 6    Number of Visits 12    Date for PT Re-Evaluation 02/02/22    Progress Note Due on Visit 10    PT Start Time 1430    PT Stop Time 1510    PT Time Calculation (min) 40 min    Activity Tolerance Patient tolerated treatment well;No increased pain    Behavior During Therapy WFL for tasks assessed/performed            Past Medical History:  Diagnosis Date   Allergic rhinitis    Arthritis    Asthma    Depression    Goiter    Goiter    Headache(784.0)    Sleep apnea    Past Surgical History:  Procedure Laterality Date   ABDOMINAL HYSTERECTOMY     CHOLECYSTECTOMY N/A 11/02/2013   Procedure: LAPAROSCOPIC CHOLECYSTECTOMY ;  Surgeon: Cherylynn Ridges, MD;  Location: [facility] OR;  Service: General;  Laterality: N/A;   KNEE SURGERY     THYROIDECTOMY     Patient Active Problem List   Diagnosis Date Noted   GERD (gastroesophageal reflux disease) 07/17/2019   OA (osteoarthritis) of knee s/p TKA 2021 04/13/2019   MDD (major depressive disorder) 09/13/2016   Tinea versicolor 07/06/2016   IBS (irritable bowel syndrome) 01/02/2016   Hypothyroidism 10/17/2015   Migraine 11/27/2012   Insomnia 07/21/2009   Allergies 01/19/2008   Asthma, allergic 01/19/2008    REFERRING DIAG: M25.561,G89.29 (ICD-10-CM) - Chronic pain of right knee  THERAPY DIAG:  Stiffness of right knee, not elsewhere classified  Chronic pain of right knee  Muscle weakness (generalized)  Localized edema  Rationale for Evaluation and Treatment Rehabilitation  PERTINENT HISTORY: R TKA 2006; had injections in right knee and was then told was bone on bone in 2019. R TKR 2020 with manipulation. Pt had prior therapy s/p.   PRECAUTIONS:  none  SUBJECTIVE: says says she has been dealing with a lot lately but her knee is not hurting much today PAIN:  Are you having pain? Yes: NPRS scale: currently 1/10 Pain location: Rt knee anterior patella with flexion  Pain description: achy, throbbing Aggravating factors: flexion and full extension Relieving factors: resting   OBJECTIVE: (objective measures completed at initial evaluation unless otherwise dated)  DIAGNOSTIC FINDINGS: 10/15/21 Xray: 2 view radiographs of the right knee shows a stable total knee  arthroplasty.  There may be some slight lucent lines around the implants.    There is slight movement artifact.   PATIENT SURVEYS:  12/22/21: FOTO 33% function   COGNITION:           Overall cognitive status: Within functional limits for tasks assessed                          SENSATION: WFL   EDEMA: None at eval (12/22/21)     POSTURE:  kicks leg out to perform STS   PALPATION: 12/22/21: Limited patella mobility R, good quad activation R LE with quad set and SLR   LOWER EXTREMITY ROM:   Active ROM Right Eval 12/22/21 Left Eval 12/22/21 Rt  12/29/21  Hip flexion       Hip extension  Hip abduction       Hip adduction       Hip internal rotation       Hip external rotation       Knee flexion 70 114 74  Knee extension 0 0 0  Ankle dorsiflexion       Ankle plantarflexion       Ankle inversion       Ankle eversion        (Blank rows = not tested)   LOWER EXTREMITY MMT:   MMT Right Eval 12/22/21 Left Eval 12/22/21  Hip flexion 4+/5 3-/5  Hip extension 2-/5 2-/5  Hip abduction 3+/5 4+/5  Hip adduction 4+/5    Hip internal rotation      Hip external rotation      Knee flexion 4-/5 4+/5  Knee extension 4+/5 4+/5  Ankle dorsiflexion 5/5 5/5  Ankle plantarflexion      Ankle inversion      Ankle eversion       (Blank rows = not tested) no ext lag with SLR     GAIT: 12/22/21; Distance walked: in gym Assistive device utilized: Single point  cane Level of assistance: Complete Independence Comments: uses cane only to get out of the tub, but not to walk. Minimal heel strike R with gait.    TODAY'S TREATMENT: 01/18/22 -Nu step L6 X 8 min, seat #9 working on flexion ROM -Standing lunge stretch for knee flexion 5 sec X10 -Seated knee flexion stretch AAROM 5 sec X10 -Seated LAQ 5# 2X15 bilat   Functional Activities (for sit to stand, stairs, curbs): -B leg press 87# 20X each slow eccentrics, then SL only 37# X 15 each side -Sit to stand slow eccentrics 2 sets of 5  -Step up with Rt leg and down in front with left leg, off 6 inch step using one UE support 10   01/13/22 -Nu step L6 X 8 min working on flexion ROM -Standing lunge stretch forknee flexion 5 sec X15 -Seated knee flexion stretch AAROM 10 sec X10 -Seated LAQ 5# 2X15 bilat  Functional Activities (for sit to stand, stairs, curbs): -B leg press 87# 20X each slow eccentrics, then SL only 37# X 15 each side -Sit to stand slow eccentrics 2 sets of 5  -Step-down off 4 inch step using one UE support 10 B       PATIENT EDUCATION:  Education details: HEP Person educated: Patient Education method: Explanation, Demonstration, and Handouts Education comprehension: verbalized understanding, returned demonstration, and needs further education     HOME EXERCISE PROGRAM: Access Code: [email or web address] URL: [email or web address] Date: 01/01/2022 Prepared by: Pauletta Browns  Exercises - Seated Straight Leg Raise  - 2 x daily - 7 x weekly - 3-5 sets - 5 reps - 3 seconds hold - Sit to Stand with Armchair  - 2 x daily - 7 x weekly - 1 sets - 5 reps   ASSESSMENT:   CLINICAL IMPRESSION: We continued to focus on improving Rt knee flexion ROM and overall bilat leg strength to her tolerance in order to progress functional standing activity. She had good overall tolerance to this today within session.    OBJECTIVE IMPAIRMENTS decreased activity tolerance, decreased mobility,  decreased ROM, decreased strength, and impaired flexibility.    ACTIVITY LIMITATIONS bending, sitting, standing, squatting, stairs, transfers, toileting, and locomotion level   PARTICIPATION LIMITATIONS: cleaning and community activity   PERSONAL FACTORS 1 comorbidity: R TKR  are also affecting patient's functional outcome.    REHAB  POTENTIAL: Good   CLINICAL DECISION MAKING: Stable/uncomplicated   EVALUATION COMPLEXITY: Low     GOALS: Goals reviewed with patient? Yes   SHORT TERM GOALS: Target date: 01/12/2022  Pt will be indep with HEP to assist with weakness to assist with transfers Baseline: initiated at eval Goal status: On-going 12/29/21   2.  Pt will demo improved R knee flexion to 80 degrees to assist with more normalized STS.  Baseline: 70 degrees Goal status: INITIAL   3.  Pt will be able to perform STS transfers with 25% less difficulty with more equal WB R LE Baseline: unable, kicks R LE out to perform Goal status: INITIAL       LONG TERM GOALS: Target date: 02/02/2022    Pt will demo improved foto to 46% function Baseline: 33% function Goal status: INITIAL   2.  Pt will demo improved R LE strength >/=1/2 MMT grade to assist with walking up/down stairs.  Baseline:  MMT Right eval Left eval  Hip flexion 4+/5 3-/5  Hip extension 2-/5 2-/5  Hip abduction 3+/5 4+/5  Hip adduction 4+/5    Hip internal rotation      Hip external rotation      Knee flexion 4-/5 4+/5  Knee extension 4+/5 4+/5  Ankle dorsiflexion 5/5 5/5    Goal status: INITIAL   3.  Pt will be able to walk up/down stairs reciprocally with 75% less difficulty Baseline: step to with usage of rail and hesitation due to R LE weakness Goal status: INITIAL   4.  Pt will able to STS from a low bed with 75% less difficulty and minimal compensation of R LE Baseline: unable to get up from low bed/toilet  Goal status: INITIAL         PLAN: PT FREQUENCY: 2x/week   PT DURATION: 6 weeks    PLANNED INTERVENTIONS: Therapeutic exercises, Therapeutic activity, Gait training, Patient/Family education, Stair training, Moist heat, Manual therapy, and Re-evaluation   PLAN FOR NEXT SESSION:  R knee flexion stretching and bilat knee strengthening to tolerance, work toward stairs.       Debbe Odea, PT, DPT 01/18/2022, 2:35 PM

## 2022-01-20 ENCOUNTER — Ambulatory Visit (INDEPENDENT_AMBULATORY_CARE_PROVIDER_SITE_OTHER): Payer: Medicare Other | Admitting: Physical Therapy

## 2022-01-20 ENCOUNTER — Encounter: Payer: Self-pay | Admitting: Physical Therapy

## 2022-01-20 DIAGNOSIS — M25561 Pain in right knee: Secondary | ICD-10-CM | POA: Diagnosis not present

## 2022-01-20 DIAGNOSIS — M6281 Muscle weakness (generalized): Secondary | ICD-10-CM | POA: Diagnosis not present

## 2022-01-20 DIAGNOSIS — R600 Localized edema: Secondary | ICD-10-CM

## 2022-01-20 DIAGNOSIS — G8929 Other chronic pain: Secondary | ICD-10-CM | POA: Diagnosis not present

## 2022-01-20 DIAGNOSIS — M25661 Stiffness of right knee, not elsewhere classified: Secondary | ICD-10-CM | POA: Diagnosis not present

## 2022-01-20 NOTE — Therapy (Signed)
OUTPATIENT PHYSICAL THERAPY TREATMENT NOTE   Patient Name: Brooke Schneider MRN: [id number] DOB:July 23, 1950, 71 y.o., female Today's Date: 01/20/2022  PCP: Jacquiline Doe, MD REFERRING PROVIDER: Nadara Mustard, MD  END OF SESSION:   PT End of Session - 01/20/22 1438     Visit Number 7    Number of Visits 12    Date for PT Re-Evaluation 02/02/22    Progress Note Due on Visit 10    PT Start Time 1430    PT Stop Time 1510    PT Time Calculation (min) 40 min    Activity Tolerance Patient tolerated treatment well;No increased pain    Behavior During Therapy WFL for tasks assessed/performed            Past Medical History:  Diagnosis Date   Allergic rhinitis    Arthritis    Asthma    Depression    Goiter    Goiter    Headache(784.0)    Sleep apnea    Past Surgical History:  Procedure Laterality Date   ABDOMINAL HYSTERECTOMY     CHOLECYSTECTOMY N/A 11/02/2013   Procedure: LAPAROSCOPIC CHOLECYSTECTOMY ;  Surgeon: Cherylynn Ridges, MD;  Location: [facility] OR;  Service: General;  Laterality: N/A;   KNEE SURGERY     THYROIDECTOMY     Patient Active Problem List   Diagnosis Date Noted   GERD (gastroesophageal reflux disease) 07/17/2019   OA (osteoarthritis) of knee s/p TKA 2021 04/13/2019   MDD (major depressive disorder) 09/13/2016   Tinea versicolor 07/06/2016   IBS (irritable bowel syndrome) 01/02/2016   Hypothyroidism 10/17/2015   Migraine 11/27/2012   Insomnia 07/21/2009   Allergies 01/19/2008   Asthma, allergic 01/19/2008    REFERRING DIAG: M25.561,G89.29 (ICD-10-CM) - Chronic pain of right knee  THERAPY DIAG:  Stiffness of right knee, not elsewhere classified  Chronic pain of right knee  Muscle weakness (generalized)  Localized edema  Rationale for Evaluation and Treatment Rehabilitation  PERTINENT HISTORY: R TKA 2006; had injections in right knee and was then told was bone on bone in 2019. R TKR 2020 with manipulation. Pt had prior therapy s/p.   PRECAUTIONS:  none  SUBJECTIVE: says says stiffness but not too much pain in her knee PAIN:  Are you having pain? Yes: NPRS scale: currently 1/10 Pain location: Rt knee anterior patella with flexion  Pain description: achy, throbbing Aggravating factors: flexion and full extension Relieving factors: resting   OBJECTIVE: (objective measures completed at initial evaluation unless otherwise dated)  DIAGNOSTIC FINDINGS: 10/15/21 Xray: 2 view radiographs of the right knee shows a stable total knee  arthroplasty.  There may be some slight lucent lines around the implants.    There is slight movement artifact.   PATIENT SURVEYS:  12/22/21: FOTO 33% function   COGNITION:           Overall cognitive status: Within functional limits for tasks assessed                          SENSATION: WFL   EDEMA: None at eval (12/22/21)     POSTURE:  kicks leg out to perform STS   PALPATION: 12/22/21: Limited patella mobility R, good quad activation R LE with quad set and SLR   LOWER EXTREMITY ROM:   Active ROM Right Eval 12/22/21 Left Eval 12/22/21 Rt  12/29/21 Right 01/20/22  Hip flexion        Hip extension  Hip abduction        Hip adduction        Hip internal rotation        Hip external rotation        Knee flexion 70 114 74 P:76  Knee extension 0 0 0   Ankle dorsiflexion        Ankle plantarflexion        Ankle inversion        Ankle eversion         (Blank rows = not tested)   LOWER EXTREMITY MMT:   MMT Right Eval 12/22/21 Left Eval 12/22/21  Hip flexion 4+/5 3-/5  Hip extension 2-/5 2-/5  Hip abduction 3+/5 4+/5  Hip adduction 4+/5    Hip internal rotation      Hip external rotation      Knee flexion 4-/5 4+/5  Knee extension 4+/5 4+/5  Ankle dorsiflexion 5/5 5/5  Ankle plantarflexion      Ankle inversion      Ankle eversion       (Blank rows = not tested) no ext lag with SLR     GAIT: 12/22/21; Distance walked: in gym Assistive device utilized: Single point  cane Level of assistance: Complete Independence Comments: uses cane only to get out of the tub, but not to walk. Minimal heel strike R with gait.    TODAY'S TREATMENT: 01/20/22 -Nu step L6 X 8 min, seat #9 working on flexion ROM -Standing lunge stretch for knee flexion 5 sec X10 -Seated knee flexion stretch AAROM 5 sec X10 -Seated leg extension machine 5# DL 2X15 stretching into max flexion 3 seconds   Functional Activities (for sit to stand, stairs, curbs): -B leg press 87# 20X each slow eccentrics, then SL only 37# X 15 each side -Sit to stand slow eccentrics 2 sets of 5  -Step up with Rt leg and down in front with left leg, off 6 inch step using one UE support 10   01/18/22 -Nu step L6 X 8 min, seat #9 working on flexion ROM -Standing lunge stretch for knee flexion 5 sec X10 -Seated knee flexion stretch AAROM 5 sec X10 -Seated LAQ 5# 2X15 bilat   Functional Activities (for sit to stand, stairs, curbs): -B leg press 87# 20X each slow eccentrics, then SL only 37# X 15 each side -Sit to stand slow eccentrics 2 sets of 5  -Step up with Rt leg and down in front with left leg, off 6 inch step using one UE support 10        PATIENT EDUCATION:  Education details: HEP Person educated: Patient Education method: Explanation, Demonstration, and Handouts Education comprehension: verbalized understanding, returned demonstration, and needs further education     HOME EXERCISE PROGRAM: Access Code: [email or web address] URL: [email or web address] Date: 01/01/2022 Prepared by: Vista Mink  Exercises - Seated Straight Leg Raise  - 2 x daily - 7 x weekly - 3-5 sets - 5 reps - 3 seconds hold - Sit to Stand with Armchair  - 2 x daily - 7 x weekly - 1 sets - 5 reps   ASSESSMENT:   CLINICAL IMPRESSION: She showed some improvement in Rt knee flexion ROM but still limited in this area and this remains her biggest deficits. We will continue to emphasis this with her PT treatments. I  encouraged her to set up more appointments.     OBJECTIVE IMPAIRMENTS decreased activity tolerance, decreased mobility, decreased ROM, decreased strength, and impaired flexibility.    ACTIVITY  LIMITATIONS bending, sitting, standing, squatting, stairs, transfers, toileting, and locomotion level   PARTICIPATION LIMITATIONS: cleaning and community activity   PERSONAL FACTORS 1 comorbidity: R TKR  are also affecting patient's functional outcome.    REHAB POTENTIAL: Good   CLINICAL DECISION MAKING: Stable/uncomplicated   EVALUATION COMPLEXITY: Low     GOALS: Goals reviewed with patient? Yes   SHORT TERM GOALS: Target date: 01/12/2022  Pt will be indep with HEP to assist with weakness to assist with transfers Baseline: initiated at eval Goal status: On-going 12/29/21   2.  Pt will demo improved R knee flexion to 80 degrees to assist with more normalized STS.  Baseline: 70 degrees Goal status: INITIAL   3.  Pt will be able to perform STS transfers with 25% less difficulty with more equal WB R LE Baseline: unable, kicks R LE out to perform Goal status: INITIAL       LONG TERM GOALS: Target date: 02/02/2022    Pt will demo improved foto to 46% function Baseline: 33% function Goal status: INITIAL   2.  Pt will demo improved R LE strength >/=1/2 MMT grade to assist with walking up/down stairs.  Baseline:  MMT Right eval Left eval  Hip flexion 4+/5 3-/5  Hip extension 2-/5 2-/5  Hip abduction 3+/5 4+/5  Hip adduction 4+/5    Hip internal rotation      Hip external rotation      Knee flexion 4-/5 4+/5  Knee extension 4+/5 4+/5  Ankle dorsiflexion 5/5 5/5    Goal status: INITIAL   3.  Pt will be able to walk up/down stairs reciprocally with 75% less difficulty Baseline: step to with usage of rail and hesitation due to R LE weakness Goal status: INITIAL   4.  Pt will able to STS from a low bed with 75% less difficulty and minimal compensation of R LE Baseline: unable to  get up from low bed/toilet  Goal status: INITIAL         PLAN: PT FREQUENCY: 2x/week   PT DURATION: 6 weeks   PLANNED INTERVENTIONS: Therapeutic exercises, Therapeutic activity, Gait training, Patient/Family education, Stair training, Moist heat, Manual therapy, and Re-evaluation   PLAN FOR NEXT SESSION:  R knee flexion stretching and bilat knee strengthening to tolerance, work toward stairs.       April Manson, PT, DPT 01/20/2022, 2:39 PM

## 2022-02-02 ENCOUNTER — Encounter: Payer: Self-pay | Admitting: Physical Therapy

## 2022-02-02 ENCOUNTER — Ambulatory Visit (INDEPENDENT_AMBULATORY_CARE_PROVIDER_SITE_OTHER): Payer: Medicare Other | Admitting: Physical Therapy

## 2022-02-02 DIAGNOSIS — G8929 Other chronic pain: Secondary | ICD-10-CM | POA: Diagnosis not present

## 2022-02-02 DIAGNOSIS — M25561 Pain in right knee: Secondary | ICD-10-CM | POA: Diagnosis not present

## 2022-02-02 DIAGNOSIS — R600 Localized edema: Secondary | ICD-10-CM

## 2022-02-02 DIAGNOSIS — M6281 Muscle weakness (generalized): Secondary | ICD-10-CM | POA: Diagnosis not present

## 2022-02-02 DIAGNOSIS — M25661 Stiffness of right knee, not elsewhere classified: Secondary | ICD-10-CM

## 2022-02-02 NOTE — Therapy (Signed)
OUTPATIENT PHYSICAL THERAPY TREATMENT NOTE   Patient Name: Brooke Schneider MRN: [id number] DOB:10/20/1950, 71 y.o., female Today's Date: 02/02/2022  PCP: Dimas Chyle, MD REFERRING PROVIDER: Newt Minion, MD  END OF SESSION:   PT End of Session - 02/02/22 1428     Visit Number 8    Number of Visits 12    Progress Note Due on Visit 10    PT Start Time 9390    PT Stop Time 1430    PT Time Calculation (min) 41 min    Activity Tolerance Patient tolerated treatment well;No increased pain    Behavior During Therapy WFL for tasks assessed/performed             Past Medical History:  Diagnosis Date   Allergic rhinitis    Arthritis    Asthma    Depression    Goiter    Goiter    Headache(784.0)    Sleep apnea    Past Surgical History:  Procedure Laterality Date   ABDOMINAL HYSTERECTOMY     CHOLECYSTECTOMY N/A 11/02/2013   Procedure: LAPAROSCOPIC CHOLECYSTECTOMY ;  Surgeon: Gwenyth Ober, MD;  Location: [place];  Service: General;  Laterality: N/A;   KNEE SURGERY     THYROIDECTOMY     Patient Active Problem List   Diagnosis Date Noted   GERD (gastroesophageal reflux disease) 07/17/2019   OA (osteoarthritis) of knee s/p TKA 2021 04/13/2019   MDD (major depressive disorder) 09/13/2016   Tinea versicolor 07/06/2016   IBS (irritable bowel syndrome) 01/02/2016   Hypothyroidism 10/17/2015   Migraine 11/27/2012   Insomnia 07/21/2009   Allergies 01/19/2008   Asthma, allergic 01/19/2008    REFERRING DIAG: M25.561,G89.29 (ICD-10-CM) - Chronic pain of right knee  THERAPY DIAG:  Stiffness of right knee, not elsewhere classified  Chronic pain of right knee  Muscle weakness (generalized)  Localized edema  Rationale for Evaluation and Treatment Rehabilitation  PERTINENT HISTORY: R TKA 2006; had injections in right knee and was then told was bone on bone in 2019. R TKR 2020 with manipulation. Pt had prior therapy s/p.   PRECAUTIONS: none  SUBJECTIVE: Pt arriving  today reporting no pain in her Rt knee at rest. Pt stating with flexion her knee pain increases to 6-7/10 when given visual pain scale.   PAIN:  Are you having pain? No pain at rest upon arrival, but pt reporting pain with flexion/bending can increase to 6-7/10.  Pt taking over the counter pain meds (tylenol arthritis)   OBJECTIVE: (objective measures completed at initial evaluation unless otherwise dated)  DIAGNOSTIC FINDINGS: 10/15/21 Xray: 2 view radiographs of the right knee shows a stable total knee  arthroplasty.  There may be some slight lucent lines around the implants.    There is slight movement artifact.   PATIENT SURVEYS:  12/22/21: FOTO 33% function   COGNITION:           Overall cognitive status: Within functional limits for tasks assessed                          SENSATION: WFL   EDEMA: None at eval (12/22/21)     POSTURE:  kicks leg out to perform STS   PALPATION: 12/22/21: Limited patella mobility R, good quad activation R LE with quad set and SLR   LOWER EXTREMITY ROM:   Active ROM Right Eval 12/22/21 Left Eval 12/22/21 Rt  12/29/21 Right 01/20/22 Rt 02/02/22  Hip flexion  Hip extension         Hip abduction         Hip adduction         Hip internal rotation         Hip external rotation         Knee flexion 70 114 74 P:76 P: 80 A: 75  Knee extension 0 0 0  A: 0  Ankle dorsiflexion         Ankle plantarflexion         Ankle inversion         Ankle eversion          (Blank rows = not tested)   LOWER EXTREMITY MMT:   MMT Right Eval 12/22/21 Left Eval 12/22/21  Hip flexion 4+/5 3-/5  Hip extension 2-/5 2-/5  Hip abduction 3+/5 4+/5  Hip adduction 4+/5    Hip internal rotation      Hip external rotation      Knee flexion 4-/5 4+/5  Knee extension 4+/5 4+/5  Ankle dorsiflexion 5/5 5/5  Ankle plantarflexion      Ankle inversion      Ankle eversion       (Blank rows = not tested) no ext lag with SLR     GAIT: 12/22/21; Distance  walked: in gym Assistive device utilized: Single point cane Level of assistance: Complete Independence Comments: uses cane only to get out of the tub, but not to walk. Minimal heel strike R with gait.    TODAY'S TREATMENT: 02/02/22 TherEx: Nu step L5 X 6 min  Seated knee flexion stretch AAROM 5 sec X10 Seated leg extension machine 5# DL 2X15 stretching into max flexion 3 seconds Leg Press: 87# bil LE's 2 x 10 Leg Press: 37# x 15 Single leg Therapeutic Activities: Step up on 4 inch step x 10 c single UE support Step up on 6 inch step x 10 c single UE support Lateral step up on 4 inch step x 10 each LE Side stepping x 20 feet x 2 each direction  Manual: Percussion to Rt quad with skilled palpation over trigger points     01/20/22 -Nu step L6 X 8 min, seat #9 working on flexion ROM -Standing lunge stretch for knee flexion 5 sec X10 -Seated knee flexion stretch AAROM 5 sec X10 -Seated leg extension machine 5# DL 2X15 stretching into max flexion 3 seconds   Functional Activities (for sit to stand, stairs, curbs): -B leg press 87# 20X each slow eccentrics, then SL only 37# X 15 each side -Sit to stand slow eccentrics 2 sets of 5  -Step up with Rt leg and down in front with left leg, off 6 inch step using one UE support 10   01/18/22 -Nu step L6 X 8 min, seat #9 working on flexion ROM -Standing lunge stretch for knee flexion 5 sec X10 -Seated knee flexion stretch AAROM 5 sec X10 -Seated LAQ 5# 2X15 bilat   Functional Activities (for sit to stand, stairs, curbs): -B leg press 87# 20X each slow eccentrics, then SL only 37# X 15 each side -Sit to stand slow eccentrics 2 sets of 5  -Step up with Rt leg and down in front with left leg, off 6 inch step using one UE support 10        PATIENT EDUCATION:  Education details: HEP Person educated: Patient Education method: Explanation, Demonstration, and Handouts Education comprehension: verbalized understanding, returned  demonstration, and needs further education  HOME EXERCISE PROGRAM: Access Code: [email or web address] URL: [email or web address] Date: 02/02/2022 Prepared by: Kearney Hard  Exercises - Supine Heel Slide  - 2 x daily - 7 x weekly - 1 sets - 15 reps - Supine Bridge  - 2 x daily - 7 x weekly - 1 sets - 10 reps - Seated Knee Flexion Slide  - 2 x daily - 7 x weekly - 1 sets - 3 reps - Small Range Straight Leg Raise  - 2 x daily - 7 x weekly - 3-5 sets - 5 reps - 3 seconds hold - Sit to Stand with Armchair  - 2 x daily - 7 x weekly - 1 sets - 5 reps   ASSESSMENT:   CLINICAL IMPRESSION: Pt tolerating exercises well today with focus on functional mobility and stair training. Pt still reporting 6-7/10 pain with Rt knee flexion. Pt stating she has been taking over the counter Tylenol Arthritis which seems to help some. Pt with good response to percussion to Rt quad, pt reporting less stiffness and pain.  Continue skilled PT to maximize pt's function.     OBJECTIVE IMPAIRMENTS decreased activity tolerance, decreased mobility, decreased ROM, decreased strength, and impaired flexibility.    ACTIVITY LIMITATIONS bending, sitting, standing, squatting, stairs, transfers, toileting, and locomotion level   PARTICIPATION LIMITATIONS: cleaning and community activity   PERSONAL FACTORS 1 comorbidity: R TKR  are also affecting patient's functional outcome.    REHAB POTENTIAL: Good   CLINICAL DECISION MAKING: Stable/uncomplicated   EVALUATION COMPLEXITY: Low     GOALS: Goals reviewed with patient? Yes   SHORT TERM GOALS: Target date: 01/12/2022  Pt will be indep with HEP to assist with weakness to assist with transfers Baseline: initiated at eval Goal status: MET 02/02/22   2.  Pt will demo improved R knee flexion to 80 degrees to assist with more normalized STS.  Baseline: 70 degrees Goal status: INITIAL   3.  Pt will be able to perform STS transfers with 25% less difficulty with more  equal WB R LE Baseline: unable, kicks R LE out to perform Goal status: On-going 02/02/22       LONG TERM GOALS: Target date: 02/02/2022    Pt will demo improved foto to 46% function Baseline: 33% function Goal status: INITIAL   2.  Pt will demo improved R LE strength >/=1/2 MMT grade to assist with walking up/down stairs.  Baseline:  MMT Right eval Left eval  Hip flexion 4+/5 3-/5  Hip extension 2-/5 2-/5  Hip abduction 3+/5 4+/5  Hip adduction 4+/5    Hip internal rotation      Hip external rotation      Knee flexion 4-/5 4+/5  Knee extension 4+/5 4+/5  Ankle dorsiflexion 5/5 5/5    Goal status: INITIAL   3.  Pt will be able to walk up/down stairs reciprocally with 75% less difficulty Baseline: step to with usage of rail and hesitation due to R LE weakness Goal status: INITIAL   4.  Pt will able to STS from a low bed with 75% less difficulty and minimal compensation of R LE Baseline: unable to get up from low bed/toilet  Goal status: INITIAL         PLAN: PT FREQUENCY: 2x/week   PT DURATION: 6 weeks   PLANNED INTERVENTIONS: Therapeutic exercises, Therapeutic activity, Gait training, Patient/Family education, Stair training, Moist heat, Manual therapy, and Re-evaluation   PLAN FOR NEXT SESSION:  R knee flexion stretching and bilat knee strengthening  to tolerance, work toward stairs.       Oretha Caprice, PT, MPT 02/02/2022, 2:31 PM

## 2022-02-05 ENCOUNTER — Other Ambulatory Visit: Payer: Self-pay | Admitting: Family Medicine

## 2022-02-09 ENCOUNTER — Encounter: Payer: Self-pay | Admitting: Physical Therapy

## 2022-02-09 ENCOUNTER — Ambulatory Visit (INDEPENDENT_AMBULATORY_CARE_PROVIDER_SITE_OTHER): Payer: Medicare Other | Admitting: Physical Therapy

## 2022-02-09 DIAGNOSIS — M25561 Pain in right knee: Secondary | ICD-10-CM | POA: Diagnosis not present

## 2022-02-09 DIAGNOSIS — R600 Localized edema: Secondary | ICD-10-CM

## 2022-02-09 DIAGNOSIS — G8929 Other chronic pain: Secondary | ICD-10-CM

## 2022-02-09 DIAGNOSIS — M6281 Muscle weakness (generalized): Secondary | ICD-10-CM

## 2022-02-09 DIAGNOSIS — M25661 Stiffness of right knee, not elsewhere classified: Secondary | ICD-10-CM | POA: Diagnosis not present

## 2022-02-09 NOTE — Therapy (Signed)
OUTPATIENT PHYSICAL THERAPY TREATMENT NOTE   Patient Name: Brooke Schneider MRN: [id number] DOB:[birth year], 71 y.o., female Today's Date: 02/09/2022  PCP: Dimas Chyle, MD REFERRING PROVIDER: Newt Minion, MD  END OF SESSION:   PT End of Session - 02/09/22 1431     Visit Number 9    Number of Visits 15    Date for PT Re-Evaluation 03/23/22    Progress Note Due on Visit 19    PT Start Time 1431    PT Stop Time 1509    PT Time Calculation (min) 38 min    Activity Tolerance Patient tolerated treatment well;No increased pain    Behavior During Therapy WFL for tasks assessed/performed             Past Medical History:  Diagnosis Date   Allergic rhinitis    Arthritis    Asthma    Depression    Goiter    Goiter    Headache(784.0)    Sleep apnea    Past Surgical History:  Procedure Laterality Date   ABDOMINAL HYSTERECTOMY     CHOLECYSTECTOMY N/A 11/02/2013   Procedure: LAPAROSCOPIC CHOLECYSTECTOMY ;  Surgeon: Gwenyth Ober, MD;  Location: [place];  Service: General;  Laterality: N/A;   KNEE SURGERY     THYROIDECTOMY     Patient Active Problem List   Diagnosis Date Noted   GERD (gastroesophageal reflux disease) 07/17/2019   OA (osteoarthritis) of knee s/p TKA 2021 04/13/2019   MDD (major depressive disorder) 09/13/2016   Tinea versicolor 07/06/2016   IBS (irritable bowel syndrome) 01/02/2016   Hypothyroidism 10/17/2015   Migraine 11/27/2012   Insomnia 07/21/2009   Allergies 01/19/2008   Asthma, allergic 01/19/2008    REFERRING DIAG: M25.561,G89.29 (ICD-10-CM) - Chronic pain of right knee  THERAPY DIAG:  Stiffness of right knee, not elsewhere classified  Chronic pain of right knee  Muscle weakness (generalized)  Localized edema  Rationale for Evaluation and Treatment Rehabilitation  PERTINENT HISTORY: R TKA 2006; had injections in right knee and was then told was bone on bone in 2019. R TKR 2020 with manipulation. Pt had prior therapy s/p.    PRECAUTIONS: none  SUBJECTIVE: She arrives with less overall knee pain, but has had a fever lately, but now has it down some.   PAIN:  Are you having pain? No pain at rest upon arrival, but pt reporting pain with flexion/bending can increase to 6-7/10.  Pt taking over the counter pain meds (tylenol arthritis)   OBJECTIVE: (objective measures completed at initial evaluation unless otherwise dated)  DIAGNOSTIC FINDINGS: 10/15/21 Xray: 2 view radiographs of the right knee shows a stable total knee  arthroplasty.  There may be some slight lucent lines around the implants.    There is slight movement artifact.   PATIENT SURVEYS:  12/22/21: FOTO 33% function   COGNITION:           Overall cognitive status: Within functional limits for tasks assessed                          SENSATION: WFL   EDEMA: None at eval (12/22/21)     POSTURE:  kicks leg out to perform STS   PALPATION: 12/22/21: Limited patella mobility R, good quad activation R LE with quad set and SLR   LOWER EXTREMITY ROM:   Active ROM Right Eval 12/22/21 Left Eval 12/22/21 Rt  12/29/21 Right 01/20/22 Rt 02/02/22 Rt 02/09/22  Hip flexion  Hip extension          Hip abduction          Hip adduction          Hip internal rotation          Hip external rotation          Knee flexion 70 114 74 P:76 P: 80 A: 75 A:75 P:82 in sitting  Knee extension 0 0 0  A: 0   Ankle dorsiflexion          Ankle plantarflexion          Ankle inversion          Ankle eversion           (Blank rows = not tested)   LOWER EXTREMITY MMT:   MMT Right Eval 12/22/21 Left Eval 12/22/21 Right 02/09/22  Hip flexion 4+/5 3-/5   Hip extension 2-/5 2-/5   Hip abduction 3+/5 4+/5   Hip adduction 4+/5     Hip internal rotation       Hip external rotation       Knee flexion 4-/5 4+/5 5  Knee extension 4+/5 4+/5 4+  Ankle dorsiflexion 5/5 5/5   Ankle plantarflexion       Ankle inversion       Ankle eversion        (Blank rows =  not tested) no ext lag with SLR     GAIT: 12/22/21; Distance walked: in gym Assistive device utilized: Single point cane Level of assistance: Complete Independence Comments: uses cane only to get out of the tub, but not to walk. Minimal heel strike R with gait.    TODAY'S TREATMENT: 02/09/22 -Nu step L6 X 8 min -sit to stand X 5 no UE support with slow eccentrics  -Sitting knee flexion stretch AAROM 10 sec X 10 -Seated SLR 2X10 on Rt -supine quad stretch manual leg off EOB 20 sec X 5  Manual: Percussion to Rt quad with and without PROM for Rt knee flexion in both supine and sitting.  02/02/22 TherEx: Nu step L5 X 6 min  Seated knee flexion stretch AAROM 5 sec X10 Seated leg extension machine 5# DL 2X15 stretching into max flexion 3 seconds Leg Press: 87# bil LE's 2 x 10 Leg Press: 37# x 15 Single leg Therapeutic Activities: Step up on 4 inch step x 10 c single UE support Step up on 6 inch step x 10 c single UE support Lateral step up on 4 inch step x 10 each LE Side stepping x 20 feet x 2 each direction  Manual: Percussion to Rt quad with skilled palpation over trigger points       PATIENT EDUCATION:  Education details: HEP Person educated: Patient Education method: Explanation, Demonstration, and Handouts Education comprehension: verbalized understanding, returned demonstration, and needs further education     HOME EXERCISE PROGRAM: Access Code: [email or web address] URL: [email or web address] Date: 02/02/2022 Prepared by: Jennifer Martin  Exercises - Supine Heel Slide  - 2 x daily - 7 x weekly - 1 sets - 15 reps - Supine Bridge  - 2 x daily - 7 x weekly - 1 sets - 10 reps - Seated Knee Flexion Slide  - 2 x daily - 7 x weekly - 1 sets - 3 reps - Small Range Straight Leg Raise  - 2 x daily - 7 x weekly - 3-5 sets - 5 reps - 3 seconds hold - Sit to Stand with Armchair  -   2 x daily - 7 x weekly - 1 sets - 5 reps   ASSESSMENT:   CLINICAL IMPRESSION: Recert today  as her PT plan of care date had expired. She has made some progress with overall knee pain, knee strength, and knee ROM however still with some functional limitations and PT is recommending to continue skilled PT to maximize pt's function. Focused more on stretching today vs strength training as she was not feeling good due to fever.     OBJECTIVE IMPAIRMENTS decreased activity tolerance, decreased mobility, decreased ROM, decreased strength, and impaired flexibility.    ACTIVITY LIMITATIONS bending, sitting, standing, squatting, stairs, transfers, toileting, and locomotion level   PARTICIPATION LIMITATIONS: cleaning and community activity   PERSONAL FACTORS 1 comorbidity: R TKR  are also affecting patient's functional outcome.    REHAB POTENTIAL: Good   CLINICAL DECISION MAKING: Stable/uncomplicated   EVALUATION COMPLEXITY: Low     GOALS: Goals reviewed with patient? Yes   SHORT TERM GOALS: Target date: 01/12/2022  Pt will be indep with HEP to assist with weakness to assist with transfers Baseline: initiated at eval Goal status: MET 02/02/22   2.  Pt will demo improved R knee flexion to 80 degrees to assist with more normalized STS.  Baseline: 70 degrees Goal status: MET 02/09/22   3.  Pt will be able to perform STS transfers with 25% less difficulty with more equal WB R LE Baseline: unable, kicks R LE out to perform Goal status: MET 02/09/22       LONG TERM GOALS: Target date: 02/02/2022    Pt will demo improved foto to 46% function Baseline: 33% function Goal status: ongoing   2.  Pt will demo improved R LE strength >/=1/2 MMT grade to assist with walking up/down stairs.  Baseline:  MMT Right eval Left eval  Hip flexion 4+/5 3-/5  Hip extension 2-/5 2-/5  Hip abduction 3+/5 4+/5  Hip adduction 4+/5    Hip internal rotation      Hip external rotation      Knee flexion 4-/5 4+/5  Knee extension 4+/5 4+/5  Ankle dorsiflexion 5/5 5/5    Goal status: ongoing   3.  Pt  will be able to walk up/down stairs reciprocally with 75% less difficulty Baseline: step to with usage of rail and hesitation due to R LE weakness Goal status: ongoing   4.  Pt will able to STS from a low bed with 75% less difficulty and minimal compensation of R LE Baseline: unable to get up from low bed/toilet  Goal status: ongoing         PLAN: PT FREQUENCY: 2x/week   PT DURATION: 6 weeks   PLANNED INTERVENTIONS: Therapeutic exercises, Therapeutic activity, Gait training, Patient/Family education, Stair training, Moist heat, Manual therapy, and Re-evaluation   PLAN FOR NEXT SESSION:  R knee flexion stretching and bilat knee strengthening to tolerance, work toward stairs.       Brian R Nelson, PT, DPT 02/09/2022, 2:38 PM     

## 2022-02-11 ENCOUNTER — Ambulatory Visit (INDEPENDENT_AMBULATORY_CARE_PROVIDER_SITE_OTHER): Payer: Medicare Other | Admitting: Physical Therapy

## 2022-02-11 ENCOUNTER — Encounter: Payer: Self-pay | Admitting: Physical Therapy

## 2022-02-11 DIAGNOSIS — M25561 Pain in right knee: Secondary | ICD-10-CM | POA: Diagnosis not present

## 2022-02-11 DIAGNOSIS — G8929 Other chronic pain: Secondary | ICD-10-CM | POA: Diagnosis not present

## 2022-02-11 DIAGNOSIS — M6281 Muscle weakness (generalized): Secondary | ICD-10-CM

## 2022-02-11 DIAGNOSIS — R600 Localized edema: Secondary | ICD-10-CM | POA: Diagnosis not present

## 2022-02-11 DIAGNOSIS — M25661 Stiffness of right knee, not elsewhere classified: Secondary | ICD-10-CM

## 2022-02-11 NOTE — Therapy (Signed)
OUTPATIENT PHYSICAL THERAPY TREATMENT NOTE   Patient Name: Brooke Schneider MRN: [id number] DOB:1951-03-12, 71 y.o., female Today's Date: 02/11/2022  PCP: Dimas Chyle, MD REFERRING PROVIDER: Newt Minion, MD  END OF SESSION:   PT End of Session - 02/11/22 1525     Visit Number 10    Number of Visits 15    Date for PT Re-Evaluation 03/23/22    Progress Note Due on Visit 19    PT Start Time 2595    PT Stop Time 6387    PT Time Calculation (min) 40 min    Activity Tolerance Patient tolerated treatment well;No increased pain    Behavior During Therapy WFL for tasks assessed/performed             Past Medical History:  Diagnosis Date   Allergic rhinitis    Arthritis    Asthma    Depression    Goiter    Goiter    Headache(784.0)    Sleep apnea    Past Surgical History:  Procedure Laterality Date   ABDOMINAL HYSTERECTOMY     CHOLECYSTECTOMY N/A 11/02/2013   Procedure: LAPAROSCOPIC CHOLECYSTECTOMY ;  Surgeon: Gwenyth Ober, MD;  Location: [place];  Service: General;  Laterality: N/A;   KNEE SURGERY     THYROIDECTOMY     Patient Active Problem List   Diagnosis Date Noted   GERD (gastroesophageal reflux disease) 07/17/2019   OA (osteoarthritis) of knee s/p TKA 2021 04/13/2019   MDD (major depressive disorder) 09/13/2016   Tinea versicolor 07/06/2016   IBS (irritable bowel syndrome) 01/02/2016   Hypothyroidism 10/17/2015   Migraine 11/27/2012   Insomnia 07/21/2009   Allergies 01/19/2008   Asthma, allergic 01/19/2008    REFERRING DIAG: M25.561,G89.29 (ICD-10-CM) - Chronic pain of right knee  THERAPY DIAG:  Stiffness of right knee, not elsewhere classified  Chronic pain of right knee  Muscle weakness (generalized)  Localized edema  Rationale for Evaluation and Treatment Rehabilitation  PERTINENT HISTORY: R TKA 2006; had injections in right knee and was then told was bone on bone in 2019. R TKR 2020 with manipulation. Pt had prior therapy s/p.    PRECAUTIONS: none  SUBJECTIVE: She is no longer having fever.  PAIN:  Are you having pain? No pain at rest upon arrival, but pt reporting pain with flexion/bending can increase to 6-7/10.  Pt taking over the counter pain meds (tylenol arthritis)   OBJECTIVE: (objective measures completed at initial evaluation unless otherwise dated)  DIAGNOSTIC FINDINGS: 10/15/21 Xray: 2 view radiographs of the right knee shows a stable total knee  arthroplasty.  There may be some slight lucent lines around the implants.    There is slight movement artifact.   PATIENT SURVEYS:  12/22/21: FOTO 33% function   COGNITION:           Overall cognitive status: Within functional limits for tasks assessed                          SENSATION: WFL   EDEMA: None at eval (12/22/21)     POSTURE:  kicks leg out to perform STS   PALPATION: 12/22/21: Limited patella mobility R, good quad activation R LE with quad set and SLR   LOWER EXTREMITY ROM:   Active ROM Right Eval 12/22/21 Left Eval 12/22/21 Rt  12/29/21 Right 01/20/22 Rt 02/02/22 Rt 02/09/22  Hip flexion          Hip extension  Hip abduction          Hip adduction          Hip internal rotation          Hip external rotation          Knee flexion 70 114 74 P:76 P: 80 A: 75 A:75 P:82 in sitting  Knee extension 0 0 0  A: 0   Ankle dorsiflexion          Ankle plantarflexion          Ankle inversion          Ankle eversion           (Blank rows = not tested)   LOWER EXTREMITY MMT:   MMT Right Eval 12/22/21 Left Eval 12/22/21 Right 02/09/22  Hip flexion 4+/5 3-/5   Hip extension 2-/5 2-/5   Hip abduction 3+/5 4+/5   Hip adduction 4+/5     Hip internal rotation       Hip external rotation       Knee flexion 4-/5 4+/5 5  Knee extension 4+/5 4+/5 4+  Ankle dorsiflexion 5/5 5/5   Ankle plantarflexion       Ankle inversion       Ankle eversion        (Blank rows = not tested) no ext lag with SLR     GAIT: 12/22/21; Distance  walked: in gym Assistive device utilized: Single point cane Level of assistance: Complete Independence Comments: uses cane only to get out of the tub, but not to walk. Minimal heel strike R with gait.    TODAY'S TREATMENT: 02/11/22 -Recumbent bike rocking for ROM X 8 min -Leg press Rt leg 25# X 15, then DL 87# X15 -Step up with Rt, down in front with left 6 inch step with one UE support X 10 -Seated knee flexion machine DL 20# X15 -Seated knee extension machine up with both and down with Rt only X15 -Seated SLR 2X10 on Rt -Seated knee flexion AAROM stretch 5 sec X15  -Manual: Percussion to Rt quad with and without PROM for Rt knee flexion in sitting.   02/09/22 -Nu step L6 X 8 min -sit to stand X 5 no UE support with slow eccentrics  -Sitting knee flexion stretch AAROM 10 sec X 10 -Seated SLR 2X10 on Rt -supine quad stretch manual leg off EOB 20 sec X 5  Manual: Percussion to Rt quad with and without PROM for Rt knee flexion in both supine and sitting.  02/02/22 TherEx: Nu step L5 X 6 min  Seated knee flexion stretch AAROM 5 sec X10 Seated leg extension machine 5# DL 2X15 stretching into max flexion 3 seconds Leg Press: 87# bil LE's 2 x 10 Leg Press: 37# x 15 Single leg Therapeutic Activities: Step up on 4 inch step x 10 c single UE support Step up on 6 inch step x 10 c single UE support Lateral step up on 4 inch step x 10 each LE Side stepping x 20 feet x 2 each direction  Manual: Percussion to Rt quad with skilled palpation over trigger points       PATIENT EDUCATION:  Education details: HEP Person educated: Patient Education method: Explanation, Demonstration, and Handouts Education comprehension: verbalized understanding, returned demonstration, and needs further education     HOME EXERCISE PROGRAM: Access Code: [email or web address] URL: [email or web address] Date: 02/02/2022 Prepared by: Kearney Hard  Exercises - Supine Heel Slide  - 2 x daily - 7 x  weekly - 1 sets - 15 reps - Supine Bridge  - 2 x daily - 7 x weekly - 1 sets - 10 reps - Seated Knee Flexion Slide  - 2 x daily - 7 x weekly - 1 sets - 3 reps - Small Range Straight Leg Raise  - 2 x daily - 7 x weekly - 3-5 sets - 5 reps - 3 seconds hold - Sit to Stand with Armchair  - 2 x daily - 7 x weekly - 1 sets - 5 reps   ASSESSMENT:   CLINICAL IMPRESSION: She was feeling better and not feverish today so we able to progress back to her usual strengthening and stretching program. She had good pain tolerance to this today.      OBJECTIVE IMPAIRMENTS decreased activity tolerance, decreased mobility, decreased ROM, decreased strength, and impaired flexibility.    ACTIVITY LIMITATIONS bending, sitting, standing, squatting, stairs, transfers, toileting, and locomotion level   PARTICIPATION LIMITATIONS: cleaning and community activity   PERSONAL FACTORS 1 comorbidity: R TKR  are also affecting patient's functional outcome.    REHAB POTENTIAL: Good   CLINICAL DECISION MAKING: Stable/uncomplicated   EVALUATION COMPLEXITY: Low     GOALS: Goals reviewed with patient? Yes   SHORT TERM GOALS: Target date: 01/12/2022  Pt will be indep with HEP to assist with weakness to assist with transfers Baseline: initiated at eval Goal status: MET 02/02/22   2.  Pt will demo improved R knee flexion to 80 degrees to assist with more normalized STS.  Baseline: 70 degrees Goal status: MET 02/09/22   3.  Pt will be able to perform STS transfers with 25% less difficulty with more equal WB R LE Baseline: unable, kicks R LE out to perform Goal status: MET 02/09/22       LONG TERM GOALS: Target date: 02/02/2022    Pt will demo improved foto to 46% function Baseline: 33% function Goal status: ongoing   2.  Pt will demo improved R LE strength >/=1/2 MMT grade to assist with walking up/down stairs.  Baseline:  MMT Right eval Left eval  Hip flexion 4+/5 3-/5  Hip extension 2-/5 2-/5  Hip abduction  3+/5 4+/5  Hip adduction 4+/5    Hip internal rotation      Hip external rotation      Knee flexion 4-/5 4+/5  Knee extension 4+/5 4+/5  Ankle dorsiflexion 5/5 5/5    Goal status: ongoing   3.  Pt will be able to walk up/down stairs reciprocally with 75% less difficulty Baseline: step to with usage of rail and hesitation due to R LE weakness Goal status: ongoing   4.  Pt will able to STS from a low bed with 75% less difficulty and minimal compensation of R LE Baseline: unable to get up from low bed/toilet  Goal status: ongoing         PLAN: PT FREQUENCY: 2x/week   PT DURATION: 6 weeks   PLANNED INTERVENTIONS: Therapeutic exercises, Therapeutic activity, Gait training, Patient/Family education, Stair training, Moist heat, Manual therapy, and Re-evaluation   PLAN FOR NEXT SESSION:  R knee flexion stretching and bilat knee strengthening to tolerance, work toward stairs.     Debbe Odea, PT, DPT 02/11/2022, 3:26 PM

## 2022-02-15 ENCOUNTER — Ambulatory Visit (INDEPENDENT_AMBULATORY_CARE_PROVIDER_SITE_OTHER): Payer: Medicare Other | Admitting: Family Medicine

## 2022-02-15 ENCOUNTER — Encounter: Payer: Self-pay | Admitting: Family Medicine

## 2022-02-15 VITALS — BP 132/80 | HR 85 | Temp 98.2°F | Ht 65.0 in | Wt 205.0 lb

## 2022-02-15 DIAGNOSIS — T7840XS Allergy, unspecified, sequela: Secondary | ICD-10-CM | POA: Diagnosis not present

## 2022-02-15 DIAGNOSIS — E039 Hypothyroidism, unspecified: Secondary | ICD-10-CM | POA: Diagnosis not present

## 2022-02-15 DIAGNOSIS — J4540 Moderate persistent asthma, uncomplicated: Secondary | ICD-10-CM | POA: Diagnosis not present

## 2022-02-15 DIAGNOSIS — K219 Gastro-esophageal reflux disease without esophagitis: Secondary | ICD-10-CM

## 2022-02-15 DIAGNOSIS — F331 Major depressive disorder, recurrent, moderate: Secondary | ICD-10-CM

## 2022-02-15 DIAGNOSIS — G43909 Migraine, unspecified, not intractable, without status migrainosus: Secondary | ICD-10-CM

## 2022-02-15 MED ORDER — RIZATRIPTAN BENZOATE 10 MG PO TABS: 10.0000 mg | ORAL_TABLET | ORAL | 2 refills | Status: DC | PRN

## 2022-02-15 MED ORDER — PANTOPRAZOLE SODIUM 40 MG PO TBEC: 40.0000 mg | DELAYED_RELEASE_TABLET | Freq: Two times a day (BID) | ORAL | 3 refills | Status: DC

## 2022-02-15 MED ORDER — BUPROPION HCL ER (XL) 300 MG PO TB24: 300.0000 mg | ORAL_TABLET | Freq: Every day | ORAL | 3 refills | Status: DC

## 2022-02-15 NOTE — Progress Notes (Signed)
   Brooke Schneider is a 71 y.o. female who presents today for an office visit.  Assessment/Plan:  Chronic Problems Addressed Today: Asthma, allergic Recently had a flu like illness that has since resolved. Had a mild asthma flare but is now back to baseline.  Normal exam today.   Contine current regiment with advair and singular. She will also continue albuterol as needed.   MDD (major depressive disorder) Has been under more stress recently due to family situation with the recent death of her mother. Will place referral to see a therapist. No SI or HI  We will increase her wellbutrin to 300mg  daily. She will follow up with me in a few weeks on mychart.   Hypothyroidism Last TSH at goal. Continue synthroid daily.   Migraine On topamax 50mg  in the morning and 100mg  at night.   Allergies Continue management per allergist.   GERD (gastroesophageal reflux disease) Stable on protonix 40mg  twice daily.   Flu shot given today.     Subjective:  HPI:  See A/p for status of chronic conditions.        Objective:  Physical Exam: BP 132/80   Pulse 85   Temp 98.2 F (36.8 C) (Temporal)   Ht 5\' 5"  (1.651 m)   Wt 205 lb (93 kg)   SpO2 97%   BMI 34.11 kg/m   Gen: No acute distress, resting comfortably CV: Regular rate and rhythm with no murmurs appreciated Pulm: Normal work of breathing, clear to auscultation bilaterally with no crackles, wheezes, or rhonchi Neuro: Grossly normal, moves all extremities Psych: Normal affect and thought content      Brooke Schneider M. , MD 02/15/2022 2:57 PM

## 2022-02-15 NOTE — Assessment & Plan Note (Addendum)
Recently had a flu like illness that has since resolved. Had a mild asthma flare but is now back to baseline.  Normal exam today.   Contine current regiment with advair and singular. She will also continue albuterol as needed.

## 2022-02-15 NOTE — Assessment & Plan Note (Signed)
Continue management per allergist.

## 2022-02-15 NOTE — Assessment & Plan Note (Signed)
Stable on protonix 40 mg twice daily 

## 2022-02-15 NOTE — Patient Instructions (Signed)
It was very nice to see you today!  I will refer you to see a therapist.  We will get your flu shot today.  Please increase Wellbutrin 300 mg daily.  I will refill your other medications.  Please let me know how you are doing in a few weeks.  We will see back in 3 to 6 months.  We will can see back sooner if needed.  Take care, Dr Jimmey Ralph  PLEASE NOTE:  If you had any lab tests please let us know if you have not heard back within a few days. You may see your results on mychart before we have a chance to review them but we will give you a call once they are reviewed by [place]. If we ordered any referrals today, please let us know if you have not heard from their office within the next week.   Please try these tips to maintain a healthy lifestyle:  Eat at least 3 REAL meals and 1-2 snacks per day.  Aim for no more than 5 hours between eating.  If you eat breakfast, please do so within one hour of getting up.   Each meal should contain half fruits/vegetables, one quarter protein, and one quarter carbs (no bigger than a computer mouse)  Cut down on sweet beverages. This includes juice, soda, and sweet tea.   Drink at least 1 glass of water with each meal and aim for at least 8 glasses per day  Exercise at least 150 minutes every week.

## 2022-02-15 NOTE — Assessment & Plan Note (Signed)
On topamax 50mg  in the morning and 100mg  at night.

## 2022-02-15 NOTE — Assessment & Plan Note (Addendum)
Has been under more stress recently due to family situation with the recent death of her mother. Will place referral to see a therapist. No SI or HI  We will increase her wellbutrin to 300mg  daily. She will follow up with me in a few weeks on mychart.

## 2022-02-15 NOTE — Assessment & Plan Note (Signed)
Last TSH at goal. Continue synthroid daily.

## 2022-02-16 ENCOUNTER — Encounter: Payer: Medicare Other | Admitting: Physical Therapy

## 2022-02-18 ENCOUNTER — Ambulatory Visit (INDEPENDENT_AMBULATORY_CARE_PROVIDER_SITE_OTHER): Payer: Medicare Other | Admitting: Physical Therapy

## 2022-02-18 ENCOUNTER — Telehealth: Payer: Self-pay

## 2022-02-18 ENCOUNTER — Encounter: Payer: Self-pay | Admitting: Physical Therapy

## 2022-02-18 DIAGNOSIS — M6281 Muscle weakness (generalized): Secondary | ICD-10-CM | POA: Diagnosis not present

## 2022-02-18 DIAGNOSIS — M25561 Pain in right knee: Secondary | ICD-10-CM | POA: Diagnosis not present

## 2022-02-18 DIAGNOSIS — R600 Localized edema: Secondary | ICD-10-CM

## 2022-02-18 DIAGNOSIS — M25661 Stiffness of right knee, not elsewhere classified: Secondary | ICD-10-CM | POA: Diagnosis not present

## 2022-02-18 DIAGNOSIS — G8929 Other chronic pain: Secondary | ICD-10-CM

## 2022-02-18 MED ORDER — HYDROXYZINE HCL 25 MG PO TABS: 25.0000 mg | ORAL_TABLET | Freq: Two times a day (BID) | ORAL | 5 refills | Status: DC | PRN

## 2022-02-18 NOTE — Telephone Encounter (Signed)
We can do 25mg  BID PRN.   , MD [facility]

## 2022-02-18 NOTE — Therapy (Addendum)
OUTPATIENT PHYSICAL THERAPY TREATMENT NOTE/Discharge  PHYSICAL THERAPY DISCHARGE SUMMARY  Visits from Start of Care: 11  Current functional level related to goals / functional outcomes: See below   Remaining deficits: See below   Education / Equipment: HEP  Plan:  Patient goals were met. Patient is being discharged due to meeting PT goals     Patient Name: Brooke Schneider MRN: [id number] DOB:[birth year], 71 y.o., female Today's Date: 02/18/2022  PCP: Dimas Chyle, MD REFERRING PROVIDER: Newt Minion, MD  END OF SESSION:   PT End of Session - 02/18/22 1522     Visit Number 11    Number of Visits 15    Date for PT Re-Evaluation 03/23/22    Progress Note Due on Visit 49    PT Start Time 1515    PT Stop Time 1555    PT Time Calculation (min) 40 min    Activity Tolerance Patient tolerated treatment well    Behavior During Therapy WFL for tasks assessed/performed             Past Medical History:  Diagnosis Date   Allergic rhinitis    Arthritis    Asthma    Depression    Goiter    Goiter    Headache(784.0)    Sleep apnea    Past Surgical History:  Procedure Laterality Date   ABDOMINAL HYSTERECTOMY     CHOLECYSTECTOMY N/A 11/02/2013   Procedure: LAPAROSCOPIC CHOLECYSTECTOMY ;  Surgeon: Gwenyth Ober, MD;  Location: [place];  Service: General;  Laterality: N/A;   KNEE SURGERY     THYROIDECTOMY     Patient Active Problem List   Diagnosis Date Noted   GERD (gastroesophageal reflux disease) 07/17/2019   OA (osteoarthritis) of knee s/p TKA 2021 04/13/2019   MDD (major depressive disorder) 09/13/2016   Tinea versicolor 07/06/2016   IBS (irritable bowel syndrome) 01/02/2016   Hypothyroidism 10/17/2015   Migraine 11/27/2012   Insomnia 07/21/2009   Allergies 01/19/2008   Asthma, allergic 01/19/2008    REFERRING DIAG: M25.561,G89.29 (ICD-10-CM) - Chronic pain of right knee  THERAPY DIAG:  Stiffness of right knee, not elsewhere classified  Chronic pain  of right knee  Muscle weakness (generalized)  Localized edema  Rationale for Evaluation and Treatment Rehabilitation  PERTINENT HISTORY: R TKA 2006; had injections in right knee and was then told was bone on bone in 2019. R TKR 2020 with manipulation. Pt had prior therapy s/p.   PRECAUTIONS: none  SUBJECTIVE: She relays doing better than she was, as she had been dealing with flu like symptoms. She feels ready to DC today  PAIN:  Are you having pain? No pain at rest upon arrival, but pt reporting pain with flexion/bending can increase to 6-7/10.  Pt taking over the counter pain meds (tylenol arthritis)   OBJECTIVE: (objective measures completed at initial evaluation unless otherwise dated)  DIAGNOSTIC FINDINGS: 10/15/21 Xray: 2 view radiographs of the right knee shows a stable total knee  arthroplasty.  There may be some slight lucent lines around the implants.    There is slight movement artifact.   PATIENT SURVEYS:  12/22/21: FOTO 33% function   COGNITION:           Overall cognitive status: Within functional limits for tasks assessed                          SENSATION: WFL   EDEMA: None at eval (12/22/21)  POSTURE:  kicks leg out to perform STS   PALPATION: 12/22/21: Limited patella mobility R, good quad activation R LE with quad set and SLR   LOWER EXTREMITY ROM:   Active ROM Right Eval 12/22/21 Left Eval 12/22/21 Rt  12/29/21 Right 01/20/22 Rt 02/02/22 Rt 02/09/22  Hip flexion          Hip extension          Hip abduction          Hip adduction          Hip internal rotation          Hip external rotation          Knee flexion 70 114 74 P:76 P: 80 A: 75 A:75 P:82 in sitting  Knee extension 0 0 0  A: 0   Ankle dorsiflexion          Ankle plantarflexion          Ankle inversion          Ankle eversion           (Blank rows = not tested)   LOWER EXTREMITY MMT:   MMT Right Eval 12/22/21 Left Eval 12/22/21 Right 02/09/22 Right  Hip flexion 4+/5 3-/5     Hip extension 2-/5 2-/5    Hip abduction 3+/5 4+/5    Hip adduction 4+/5      Hip internal rotation        Hip external rotation        Knee flexion 4-/5 4+/_0 Knee extension 4+/5 4+/5 4+ 5  Ankle dorsiflexion 5/5 5/5    Ankle plantarflexion        Ankle inversion        Ankle eversion         (Blank rows = not tested) no ext lag with SLR     GAIT: 12/22/21; Distance walked: in gym Assistive device utilized: Single point cane Level of assistance: Complete Independence Comments: uses cane only to get out of the tub, but not to walk. Minimal heel strike R with gait.    TODAY'S TREATMENT: 02/18/22 -Recumbent bike rocking for ROM X 8 min -Leg press Rt leg 25# X 20, then DL 87# X20 -Step up with Rt, down in front with left 6 inch step with one UE support X 10 -Seated knee flexion machine DL 25# X15 -Seated knee extension machine 10# DL X15, then 5# up with both and down with Rt only X15 -Seated SLR 2X10 on Rt -Seated knee flexion AAROM stretch 5 sec X15  -Manual: PROM and mobs for Rt knee flexion in sitting.  02/11/22 -Recumbent bike rocking for ROM X 8 min -Leg press Rt leg 25# X 15, then DL 87# X15 -Step up with Rt, down in front with left 6 inch step with one UE support X 10 -Seated knee flexion machine DL 20# X15 -Seated knee extension machine up with both and down with Rt only X15 -Seated SLR 2X10 on Rt -Seated knee flexion AAROM stretch 5 sec X15  -Manual: Percussion to Rt quad with and without PROM for Rt knee flexion in sitting.      PATIENT EDUCATION:  Education details: HEP Person educated: Patient Education method: Explanation, Demonstration, and Handouts Education comprehension: verbalized understanding, returned demonstration, and needs further education     HOME EXERCISE PROGRAM: Access Code: [email or web address] URL: [email or web address] Date: 02/02/2022 Prepared by: Kearney Hard  Exercises - Supine Heel Slide  - 2 x  daily - 7 x weekly - 1  sets - 15 reps - Supine Bridge  - 2 x daily - 7 x weekly - 1 sets - 10 reps - Seated Knee Flexion Slide  - 2 x daily - 7 x weekly - 1 sets - 3 reps - Small Range Straight Leg Raise  - 2 x daily - 7 x weekly - 3-5 sets - 5 reps - 3 seconds hold - Sit to Stand with Armchair  - 2 x daily - 7 x weekly - 1 sets - 5 reps   ASSESSMENT:   CLINICAL IMPRESSION: She had good tolerance to strength progressions today. She has met PT goals and feels ready to discharge to independent program.   OBJECTIVE IMPAIRMENTS decreased activity tolerance, decreased mobility, decreased ROM, decreased strength, and impaired flexibility.    ACTIVITY LIMITATIONS bending, sitting, standing, squatting, stairs, transfers, toileting, and locomotion level   PARTICIPATION LIMITATIONS: cleaning and community activity   PERSONAL FACTORS 1 comorbidity: R TKR  are also affecting patient's functional outcome.    REHAB POTENTIAL: Good   CLINICAL DECISION MAKING: Stable/uncomplicated   EVALUATION COMPLEXITY: Low     GOALS: Goals reviewed with patient? Yes   SHORT TERM GOALS: Target date: 01/12/2022  Pt will be indep with HEP to assist with weakness to assist with transfers Baseline: initiated at eval Goal status: MET 02/02/22   2.  Pt will demo improved R knee flexion to 80 degrees to assist with more normalized STS.  Baseline: 70 degrees Goal status: MET 02/09/22   3.  Pt will be able to perform STS transfers with 25% less difficulty with more equal WB R LE Baseline: unable, kicks R LE out to perform Goal status: MET 02/09/22       LONG TERM GOALS: Target date: 02/02/2022    Pt will demo improved foto to 46% function Baseline: 33% function Goal status: MET, improved to 65% on 02/18/22   2.  Pt will demo improved R LE strength >/=1/2 MMT grade to assist with walking up/down stairs.  Baseline:   Goal status: MET   3.  Pt will be able to walk up/down stairs reciprocally with 75% less difficulty Baseline: step to  with usage of rail and hesitation due to R LE weakness Goal status: MET with simulated stairs   4.  Pt will able to STS from a low bed with 75% less difficulty and minimal compensation of R LE Baseline: unable to get up from low bed/toilet  Goal status: MET       PLAN: PT FREQUENCY: 2x/week   PT DURATION: 6 weeks   PLANNED INTERVENTIONS: Therapeutic exercises, Therapeutic activity, Gait training, Patient/Family education, Stair training, Moist heat, Manual therapy, and Re-evaluation   PLAN FOR NEXT SESSION:  DC today    Debbe Odea, PT, DPT 02/18/2022, 3:23 PM

## 2022-02-18 NOTE — Telephone Encounter (Signed)
Hydroxyine Medication sent in per Dr. Dellis Anes approval.

## 2022-02-18 NOTE — Telephone Encounter (Signed)
Patient stopped by to see if we can send in a refill for hydroxyzine.   [employer]

## 2022-02-18 NOTE — Addendum Note (Signed)
Addended by: Berna Bue on: 02/18/2022 04:34 PM   Modules accepted: Orders

## 2022-02-18 NOTE — Addendum Note (Signed)
Addended by: Rolland Bimler D on: 02/18/2022 05:38 PM   Modules accepted: Orders

## 2022-02-18 NOTE — Telephone Encounter (Signed)
Please advise to refill as it is not listed on last avs penned to this encounter

## 2022-03-01 ENCOUNTER — Encounter: Payer: Self-pay | Admitting: *Deleted

## 2022-03-17 ENCOUNTER — Other Ambulatory Visit: Payer: Self-pay | Admitting: Family Medicine

## 2022-03-18 NOTE — Telephone Encounter (Signed)
Pt requesting refill for Zolpidem. Last OV 02/15/2022

## 2022-04-01 ENCOUNTER — Ambulatory Visit: Payer: Medicare Other | Admitting: Psychologist

## 2022-04-19 DIAGNOSIS — R300 Dysuria: Secondary | ICD-10-CM | POA: Diagnosis not present

## 2022-04-19 DIAGNOSIS — L723 Sebaceous cyst: Secondary | ICD-10-CM | POA: Diagnosis not present

## 2022-04-19 DIAGNOSIS — N952 Postmenopausal atrophic vaginitis: Secondary | ICD-10-CM | POA: Diagnosis not present

## 2022-04-20 ENCOUNTER — Ambulatory Visit: Payer: Medicare Other | Admitting: Allergy & Immunology

## 2022-05-11 ENCOUNTER — Encounter: Payer: Self-pay | Admitting: Allergy & Immunology

## 2022-05-11 ENCOUNTER — Other Ambulatory Visit: Payer: Self-pay

## 2022-05-11 ENCOUNTER — Ambulatory Visit (INDEPENDENT_AMBULATORY_CARE_PROVIDER_SITE_OTHER): Payer: Medicare Other | Admitting: Allergy & Immunology

## 2022-05-11 VITALS — BP 122/72 | HR 98 | Temp 97.7°F | Resp 20 | Ht 65.0 in | Wt 202.0 lb

## 2022-05-11 DIAGNOSIS — J4540 Moderate persistent asthma, uncomplicated: Secondary | ICD-10-CM

## 2022-05-11 DIAGNOSIS — L299 Pruritus, unspecified: Secondary | ICD-10-CM

## 2022-05-11 DIAGNOSIS — K219 Gastro-esophageal reflux disease without esophagitis: Secondary | ICD-10-CM | POA: Diagnosis not present

## 2022-05-11 DIAGNOSIS — J3089 Other allergic rhinitis: Secondary | ICD-10-CM | POA: Diagnosis not present

## 2022-05-11 NOTE — Progress Notes (Signed)
FOLLOW UP  Date of Service/Encounter:  05/11/22   Assessment:   Moderate persistent asthma, uncomplicated   LPRD (laryngopharyngeal reflux disease)   Allergic rhinitis  Pruritus - improved with Atarax alone    Plan/Recommendations:   1. Moderate persistent asthma, uncomplicated - Lung testing looks good today. - Daily controller medication(s): Advair 500/83mcg one puff twice daily - Prior to physical activity: albuterol 2 puffs 10-15 minutes before physical activity - Rescue medications: albuterol 4 puffs every 4-6 hours as needed - Asthma control goals:  * Full participation in all desired activities (may need albuterol before activity) * Albuterol use two time or less a week on average (not counting use with activity) * Cough interfering with sleep two time or less a month * Oral steroids no more than once a year * No hospitalizations  2. LPRD (laryngopharyngeal reflux disease) - Continue with famotidine in the morning. - Continue with pantoprazole at night.  - We are not going to make any changes at this point in time.   3. Allergic rhinitis - Continue with Flonase one spray per nostril daily as needed. - Continue with Astelin one spray per nostril twice daily AS NEEDED.  - Continue with Atarax 25mg  twice daily as needed (can cause sleepiness).   4. Return in about 6 months (around 11/10/2022).     Subjective:   Brooke Schneider is a 71 y.o. female presenting today for follow up of  Chief Complaint  Patient presents with   Follow-up    Follow up     62 has a history of the following: Patient Active Problem List   Diagnosis Date Noted   GERD (gastroesophageal reflux disease) 07/17/2019   OA (osteoarthritis) of knee s/p TKA 2021 04/13/2019   MDD (major depressive disorder) 09/13/2016   Tinea versicolor 07/06/2016   IBS (irritable bowel syndrome) 01/02/2016   Hypothyroidism 10/17/2015   Migraine 11/27/2012   Insomnia 07/21/2009    Allergies 01/19/2008   Asthma, allergic 01/19/2008    History obtained from: chart review and patient.  Brooke Schneider is a 71 y.o. female presenting for a follow up visit. She was last seen in May 2023. At that time, lung testing looked great. We increased the Advair back to one puff twice daily. We continued with the use of Advair June 2023 one puff twice daily. For her reflux, we continued with famotidine and pantoprazole. Allergic rhinitis was controlled with fluticasone as well as azelastine and Atarax.   Since the last visit, she has mostly done well.   Asthma/Respiratory Symptom History: Asthma has been well controlled with the Advair.  She has not been using her albuterol much at all. Brooke Schneider's asthma has been well controlled. She has not required rescue medication, experienced nocturnal awakenings due to lower respiratory symptoms, nor have activities of daily living been limited. She has required no Emergency Department or Urgent Care visits for her asthma. She has required zero courses of systemic steroids for asthma exacerbations since the last visit. ACT score today is 25, indicating excellent asthma symptom control.   Allergic Rhinitis Symptom History: She uses Claritin OTC and this seems to  be doing the trick. She is on the Flonase and the Astelin. This all seems to help with her sinuses and whatnot. She has not needed antibiotics at all for sinus infections or ear infections.   Skin Symptom History: She has been having a lot of itching and has restarted the Atarax.  She is doing better than she has  in the past with her itching., however.   GERD Symptom History: She remains on her famotidine and pantoprazole.  She also uses Metamucil. She has a stool softener.   Otherwise, there have been no changes to her past medical history, surgical history, family history, or social history.    Review of Systems  Constitutional: Negative.  Negative for chills, fever, malaise/fatigue and weight loss.   HENT: Negative.  Negative for congestion, ear discharge, ear pain and sinus pain.   Eyes:  Negative for pain, discharge and redness.  Respiratory:  Negative for cough, sputum production, shortness of breath and wheezing.   Cardiovascular: Negative.  Negative for chest pain and palpitations.  Gastrointestinal:  Negative for abdominal pain, constipation, diarrhea, heartburn, nausea and vomiting.  Skin: Negative.  Negative for itching and rash.  Neurological:  Negative for dizziness and headaches.  Endo/Heme/Allergies:  Negative for environmental allergies. Does not bruise/bleed easily.       Objective:   Blood pressure 122/72, pulse 98, temperature 97.7 F (36.5 C), resp. rate 20, height 5\' 5"  (1.651 m), weight 202 lb (91.6 kg), SpO2 98 %. Body mass index is 33.61 kg/m.    Physical Exam Vitals reviewed.  Constitutional:      Appearance: Normal appearance. She is well-developed.     Comments: Very lovely and cooperative with the exam.   HENT:     Head: Normocephalic and atraumatic.     Right Ear: Tympanic membrane, ear canal and external ear normal. No drainage, swelling or tenderness. Tympanic membrane is not injected, scarred, erythematous, retracted or bulging.     Left Ear: Tympanic membrane, ear canal and external ear normal. No drainage, swelling or tenderness. Tympanic membrane is not injected, scarred, erythematous, retracted or bulging.     Nose: No nasal deformity, septal deviation, mucosal edema or rhinorrhea.     Right Turbinates: Enlarged, swollen and pale.     Left Turbinates: Enlarged, swollen and pale.     Right Sinus: No maxillary sinus tenderness or frontal sinus tenderness.     Left Sinus: No maxillary sinus tenderness or frontal sinus tenderness.     Comments: No nasal polyps noted.     Mouth/Throat:     Lips: Pink.     Mouth: Mucous membranes are moist. Mucous membranes are not pale and not dry.     Pharynx: Uvula midline.     Comments: Mild cobblestoning  in the posterior oropharynx.  Eyes:     General: Lids are normal. Allergic shiner present.        Right eye: No discharge.        Left eye: No discharge.     Conjunctiva/sclera: Conjunctivae normal.     Right eye: Right conjunctiva is not injected. No chemosis.    Left eye: Left conjunctiva is not injected. No chemosis.    Pupils: Pupils are equal, round, and reactive to light.  Cardiovascular:     Rate and Rhythm: Normal rate and regular rhythm.     Heart sounds: Normal heart sounds.  Pulmonary:     Effort: Pulmonary effort is normal. No tachypnea, accessory muscle usage or respiratory distress.     Breath sounds: Normal breath sounds. No wheezing, rhonchi or rales.     Comments: Moving air well in all lung fields. No increased work of breathing noted.  Chest:     Chest wall: No tenderness.  Abdominal:     Tenderness: There is no abdominal tenderness. There is no guarding or rebound.  Lymphadenopathy:  Head:     Right side of head: No submandibular, tonsillar or occipital adenopathy.     Left side of head: No submandibular, tonsillar or occipital adenopathy.     Cervical: No cervical adenopathy.  Skin:    General: Skin is warm.     Capillary Refill: Capillary refill takes less than 2 seconds.     Coloration: Skin is not pale.     Findings: No abrasion, erythema, petechiae or rash. Rash is not papular, urticarial or vesicular.     Comments: She does have some lesions on her bilateral arms that are healed over.   Neurological:     Mental Status: She is alert.  Psychiatric:        Behavior: Behavior is cooperative.      Diagnostic studies:    Spirometry: results normal (FEV1: 1.76/98%, FVC: 2.20/95%, FEV1/FVC: 80%).    Spirometry consistent with normal pattern.    Allergy Studies: none        Malachi Bonds, MD  [facility]

## 2022-05-11 NOTE — Patient Instructions (Addendum)
1. Moderate persistent asthma, uncomplicated - Lung testing looks good today. - Daily controller medication(s): Advair 500/28mcg one puff twice daily - Prior to physical activity: albuterol 2 puffs 10-15 minutes before physical activity - Rescue medications: albuterol 4 puffs every 4-6 hours as needed - Asthma control goals:  * Full participation in all desired activities (may need albuterol before activity) * Albuterol use two time or less a week on average (not counting use with activity) * Cough interfering with sleep two time or less a month * Oral steroids no more than once a year * No hospitalizations  2. LPRD (laryngopharyngeal reflux disease) - Continue with famotidine in the morning. - Continue with pantoprazole at night.  - We are not going to make any changes at this point in time.   3. Allergic rhinitis - Continue with Flonase one spray per nostril daily as needed. - Continue with Astelin one spray per nostril twice daily AS NEEDED.  - Continue with Atarax 25mg  twice daily as needed (can cause sleepiness).   4. Return in about 6 months (around 11/10/2022).    Please inform 01/10/2023 of any Emergency Department visits, hospitalizations, or changes in symptoms. Call us before going to the ED for breathing or allergy symptoms since we might be able to fit you in for a sick visit. Feel free to contact us anytime with any questions, problems, or concerns.  It was a pleasure to see you again today!   Websites that have reliable patient information: 1. American Academy of Asthma, Allergy, and Immunology: www.aaaai.org 2. Food Allergy Research and Education (FARE): foodallergy.org 3. Mothers of Asthmatics: http://www.asthmacommunitynetwork.org 4. American College of Allergy, Asthma, and Immunology: www.acaai.org   COVID-19 Vaccine Information can be found at: [place] For questions related to vaccine distribution or  appointments, please email [email or web address] or call [phone].     "Like" [phone] on Facebook and Instagram for our latest updates!      Make sure you are registered to vote! If you have moved or changed any of your contact information, you will need to get this updated before voting!  In some cases, you MAY be able to register to vote online: [place]

## 2022-05-13 DIAGNOSIS — B9689 Other specified bacterial agents as the cause of diseases classified elsewhere: Secondary | ICD-10-CM | POA: Diagnosis not present

## 2022-05-13 DIAGNOSIS — N398 Other specified disorders of urinary system: Secondary | ICD-10-CM | POA: Diagnosis not present

## 2022-05-13 DIAGNOSIS — N760 Acute vaginitis: Secondary | ICD-10-CM | POA: Diagnosis not present

## 2022-05-14 ENCOUNTER — Encounter: Payer: Self-pay | Admitting: Allergy & Immunology

## 2022-05-20 ENCOUNTER — Encounter: Payer: Self-pay | Admitting: *Deleted

## 2022-07-05 ENCOUNTER — Telehealth: Payer: Self-pay | Admitting: Allergy & Immunology

## 2022-07-05 NOTE — Telephone Encounter (Signed)
Patient states she is dealing with itching in the morning and at night. Patient takes claritin 10mg  once in the morning and has been taking the atarax 2 tablets daily as needed - she takes 1 in the morning and 1 at night. Patient states she usually takes the atarax around 8-12pm as that is usually when her symptoms start.   Patient is wondering if something else could be sent in the itching.   [place] [address]  Best contact number: [phone]

## 2022-07-05 NOTE — Telephone Encounter (Signed)
Called and spoke with the patient, she has been scheduled for an OV with Dr. Ernst Bowler tomorrow in [place]. She did say that she remembered a lot of the itching started when they got their christmas tree.

## 2022-07-05 NOTE — Telephone Encounter (Signed)
Please have her schedule an appointment since she has a rash now.

## 2022-07-05 NOTE — Telephone Encounter (Signed)
Will you please advise on behalf of Dr. Ernst Bowler?

## 2022-07-05 NOTE — Telephone Encounter (Signed)
So does she only take the atarax once at night and they Clairitin in the morning? Does she have any rashes or hives along with the itching? When did her itching get worse? When she last saw Dr. Ernst Bowler in December her itching was improved.

## 2022-07-05 NOTE — Telephone Encounter (Signed)
Thanks much!  Gurley Climer, MD [facility]  

## 2022-07-05 NOTE — Telephone Encounter (Signed)
Called and spoke with the patient and she stated that the itching became worse about 3 weeks ago and there is some sort of rash on her back. She was taking Zyrtec but it makes her too tired so she went back to taking Claritin closer to bedtime and she states the Hydroxyzine in the afternoon and even before bedtime.

## 2022-07-06 ENCOUNTER — Encounter: Payer: Self-pay | Admitting: Allergy & Immunology

## 2022-07-06 ENCOUNTER — Other Ambulatory Visit: Payer: Self-pay

## 2022-07-06 ENCOUNTER — Ambulatory Visit (INDEPENDENT_AMBULATORY_CARE_PROVIDER_SITE_OTHER): Payer: Medicare Other | Admitting: Allergy & Immunology

## 2022-07-06 ENCOUNTER — Ambulatory Visit: Payer: Medicare Other | Admitting: Allergy & Immunology

## 2022-07-06 VITALS — BP 118/78 | HR 78 | Temp 98.4°F | Resp 18

## 2022-07-06 DIAGNOSIS — K219 Gastro-esophageal reflux disease without esophagitis: Secondary | ICD-10-CM

## 2022-07-06 DIAGNOSIS — J4540 Moderate persistent asthma, uncomplicated: Secondary | ICD-10-CM | POA: Diagnosis not present

## 2022-07-06 DIAGNOSIS — L299 Pruritus, unspecified: Secondary | ICD-10-CM

## 2022-07-06 DIAGNOSIS — J3089 Other allergic rhinitis: Secondary | ICD-10-CM | POA: Diagnosis not present

## 2022-07-06 MED ORDER — HYDROCORTISONE 2.5 % EX OINT: TOPICAL_OINTMENT | Freq: Two times a day (BID) | CUTANEOUS | 1 refills | Status: DC

## 2022-07-06 MED ORDER — FLUTICASONE-SALMETEROL 500-50 MCG/ACT IN AEPB: 1.0000 | INHALATION_SPRAY | Freq: Two times a day (BID) | RESPIRATORY_TRACT | 5 refills | Status: DC

## 2022-07-06 MED ORDER — ALBUTEROL SULFATE HFA 108 (90 BASE) MCG/ACT IN AERS: INHALATION_SPRAY | RESPIRATORY_TRACT | 2 refills | Status: AC

## 2022-07-06 MED ORDER — HYDROXYZINE HCL 25 MG PO TABS: 25.0000 mg | ORAL_TABLET | Freq: Two times a day (BID) | ORAL | 5 refills | Status: DC | PRN

## 2022-07-06 MED ORDER — FLUTICASONE PROPIONATE 50 MCG/ACT NA SUSP: NASAL | 1 refills | Status: DC

## 2022-07-06 NOTE — Progress Notes (Signed)
FOLLOW UP  Date of Service/Encounter:  07/06/22   Assessment:   Moderate persistent asthma, uncomplicated   LPRD (laryngopharyngeal reflux disease)   Allergic rhinitis   Pruritus - improved with Claritin and Atarax   Plan/Recommendations:   1. Moderate persistent asthma, uncomplicated - Lung testing not done today since you were doing so well.  - Daily controller medication(s): Advair 500/74mcg one puff twice daily - Prior to physical activity: albuterol 2 puffs 10-15 minutes before physical activity - Rescue medications: albuterol 4 puffs every 4-6 hours as needed - Asthma control goals:  * Full participation in all desired activities (may need albuterol before activity) * Albuterol use two time or less a week on average (not counting use with activity) * Cough interfering with sleep two time or less a month * Oral steroids no more than once a year * No hospitalizations  2. LPRD (laryngopharyngeal reflux disease) - Continue with famotidine in the morning. - Continue with pantoprazole at night.  - We are not going to make any changes at this point in time.   3. Allergic rhinitis - Continue with Flonase one spray per nostril daily as needed. - Continue with Astelin one spray per nostril twice daily AS NEEDED.   4. Pruritis (itching) - Continue with Claritin once daily.  - Continue with Atarax 25mg  twice daily as needed (can cause sleepiness).  - Moisturize moisturize moisturize!  - Add on hydrocortisone 2.5% ointment twice daily to keep inflammation on the skin under control.  - We are going to refer you to see Dermatology as well.   5. Return in about 6 months (around 01/04/2023).    Subjective:   Brooke Schneider is a 72 y.o. female presenting today for follow up of  Chief Complaint  Patient presents with   Pruritus   Rash    Brooke Schneider has a history of the following: Patient Active Problem List   Diagnosis Date Noted   GERD (gastroesophageal  reflux disease) 07/17/2019   OA (osteoarthritis) of knee s/p TKA 2021 04/13/2019   MDD (major depressive disorder) 09/13/2016   Tinea versicolor 07/06/2016   IBS (irritable bowel syndrome) 01/02/2016   Hypothyroidism 10/17/2015   Migraine 11/27/2012   Insomnia 07/21/2009   Allergies 01/19/2008   Asthma, allergic 01/19/2008    History obtained from: chart review and patient.  Brooke Schneider is a 72 y.o. female presenting for a follow up visit.  She was last seen in December 2023.  At that time, her lung testing looked great.  We continue with Advair 500 mcg 1 puff twice daily as well as albuterol as needed.  For reflux, we continue with famotidine in the morning and pantoprazole at night.  For her allergic rhinitis, she was controlled with Flonase as well as Astelin.  She also remained on Atarax which helped her pruritus.  In the interim, she called on January 29 with symptoms worsening itching.  She had been on Claritin in the morning and Atarax 2 tablets at night.  A lot of this started when they got her Christmas tree. She started when they got their Christmas tree. She thinks that they got it two weeks before Christmas. She noticed that it was drying out and she started having more itching. Before the Christmas tree, she was having more nighttime itching. She thinks that this was just her regular nighttime symptoms when she tends to have more itching. She was using Claritin. Atarax helped her symptoms. But then it increased a bit.  The Claritin always helped a lot of her indoor and outdoor symptoms. Zyrtec makes her very sleepy and "takes too much" out of her. The Zyrtec was not really handling her symptoms.   She does not moisturize enough. She has some knee issues and she has a hard time doing her legs. This makes it more difficult. She is trying to get back to more moisturizing. She reports that she has a "hole on her face" on the left which is not very obvious. She first noticed the hole in her left  face around 2018 or something and it just seemed to get worse. She has been using vaseline and this seems to be helping. She has not seen Dermatology at all.  Asthma/Respiratory Symptom History: Her asthma is under good control with the Advair 500/50 one puff once daily. This is controlling her symptoms really well. She has not been using her albuterol at all. She thought that she might need some, but she has made it through without a problem.  She feels that her asthma is under good control.   Allergic Rhinitis Symptom History: She remains on the Flonase and the Astelin. She has not been on any antibiotics at all for sinus infections.   Otherwise, there have been no changes to her past medical history, surgical history, family history, or social history.    Review of Systems  Constitutional: Negative.  Negative for chills, fever, malaise/fatigue and weight loss.  HENT: Negative.  Negative for congestion, ear discharge, ear pain and sinus pain.   Eyes:  Negative for pain, discharge and redness.  Respiratory:  Negative for cough, sputum production, shortness of breath and wheezing.   Cardiovascular: Negative.  Negative for chest pain and palpitations.  Gastrointestinal:  Negative for abdominal pain, constipation, diarrhea, heartburn, nausea and vomiting.  Skin:  Positive for itching.  Neurological:  Negative for dizziness and headaches.  Endo/Heme/Allergies:  Positive for environmental allergies. Does not bruise/bleed easily.       Objective:   Blood pressure 118/78, pulse 78, temperature 98.4 F (36.9 C), temperature source Temporal, resp. rate 18, SpO2 96 %. There is no height or weight on file to calculate BMI.    Physical Exam Vitals reviewed.  Constitutional:      Appearance: Normal appearance. She is well-developed.     Comments: Very lovely and cooperative with the exam.   HENT:     Head: Normocephalic and atraumatic.     Right Ear: Tympanic membrane, ear canal and  external ear normal. No drainage, swelling or tenderness. Tympanic membrane is not injected, scarred, erythematous, retracted or bulging.     Left Ear: Tympanic membrane, ear canal and external ear normal. No drainage, swelling or tenderness. Tympanic membrane is not injected, scarred, erythematous, retracted or bulging.     Nose: No nasal deformity, septal deviation, mucosal edema or rhinorrhea.     Right Turbinates: Enlarged, swollen and pale.     Left Turbinates: Enlarged, swollen and pale.     Right Sinus: No maxillary sinus tenderness or frontal sinus tenderness.     Left Sinus: No maxillary sinus tenderness or frontal sinus tenderness.     Comments: No nasal polyps noted.     Mouth/Throat:     Lips: Pink.     Mouth: Mucous membranes are moist. Mucous membranes are not pale and not dry.     Pharynx: Uvula midline.     Comments: Mild cobblestoning in the posterior oropharynx.  Eyes:     General: Lids  are normal. Allergic shiner present.        Right eye: No discharge.        Left eye: No discharge.     Conjunctiva/sclera: Conjunctivae normal.     Right eye: Right conjunctiva is not injected. No chemosis.    Left eye: Left conjunctiva is not injected. No chemosis.    Pupils: Pupils are equal, round, and reactive to light.  Cardiovascular:     Rate and Rhythm: Normal rate and regular rhythm.     Heart sounds: Normal heart sounds.  Pulmonary:     Effort: Pulmonary effort is normal. No tachypnea, accessory muscle usage or respiratory distress.     Breath sounds: Normal breath sounds. No wheezing, rhonchi or rales.     Comments: Moving air well in all lung fields. No increased work of breathing noted.  Chest:     Chest wall: No tenderness.  Abdominal:     Tenderness: There is no abdominal tenderness. There is no guarding or rebound.  Lymphadenopathy:     Head:     Right side of head: No submandibular, tonsillar or occipital adenopathy.     Left side of head: No submandibular,  tonsillar or occipital adenopathy.     Cervical: No cervical adenopathy.  Skin:    General: Skin is warm.     Capillary Refill: Capillary refill takes less than 2 seconds.     Coloration: Skin is not pale.     Findings: No abrasion, erythema, petechiae or rash. Rash is not papular, urticarial or vesicular.     Comments: She does have some lesions on her bilateral arms that are healed over. There are some excoriations on the upper back.   Neurological:     Mental Status: She is alert.  Psychiatric:        Behavior: Behavior is cooperative.      Diagnostic studies: none       Salvatore Marvel, MD  [facility]

## 2022-07-06 NOTE — Patient Instructions (Addendum)
1. Moderate persistent asthma, uncomplicated - Lung testing not done today since you were doing so well.  - Daily controller medication(s): Advair 500/10mcg one puff twice daily - Prior to physical activity: albuterol 2 puffs 10-15 minutes before physical activity - Rescue medications: albuterol 4 puffs every 4-6 hours as needed - Asthma control goals:  * Full participation in all desired activities (may need albuterol before activity) * Albuterol use two time or less a week on average (not counting use with activity) * Cough interfering with sleep two time or less a month * Oral steroids no more than once a year * No hospitalizations  2. LPRD (laryngopharyngeal reflux disease) - Continue with famotidine in the morning. - Continue with pantoprazole at night.  - We are not going to make any changes at this point in time.   3. Allergic rhinitis - Continue with Flonase one spray per nostril daily as needed. - Continue with Astelin one spray per nostril twice daily AS NEEDED.   4. Pruritis (itching) - Continue with Claritin once daily.  - Continue with Atarax 25mg  twice daily as needed (can cause sleepiness).  - Moisturize moisturize moisturize!  - Add on hydrocortisone 2.5% ointment twice daily to keep inflammation on the skin under control.  - We are going to refer you to see Dermatology as well.   5. Return in about 6 months (around 01/04/2023).    Please inform us of any Emergency Department visits, hospitalizations, or changes in symptoms. Call us before going to the ED for breathing or allergy symptoms since we might be able to fit you in for a sick visit. Feel free to contact us anytime with any questions, problems, or concerns.  It was a pleasure to see you again today!   Websites that have reliable patient information: 1. American Academy of Asthma, Allergy, and Immunology: www.aaaai.org 2. Food Allergy Research and Education (FARE): foodallergy.org 3. Mothers of Asthmatics:  http://www.asthmacommunitynetwork.org 4. American College of Allergy, Asthma, and Immunology: www.acaai.org   COVID-19 Vaccine Information can be found at: ShippingScam.co.uk For questions related to vaccine distribution or appointments, please email [email or web address] or call [phone].     "Like" [place] on Facebook and Instagram for our latest updates!      Make sure you are registered to vote! If you have moved or changed any of your contact information, you will need to get this updated before voting!  In some cases, you MAY be able to register to vote online: [email or web address]

## 2022-09-14 ENCOUNTER — Ambulatory Visit (INDEPENDENT_AMBULATORY_CARE_PROVIDER_SITE_OTHER): Payer: Medicare Other | Admitting: Family Medicine

## 2022-09-14 ENCOUNTER — Encounter: Payer: Self-pay | Admitting: Family Medicine

## 2022-09-14 VITALS — BP 136/81 | HR 80 | Temp 97.5°F | Ht 65.0 in | Wt 206.4 lb

## 2022-09-14 DIAGNOSIS — F5101 Primary insomnia: Secondary | ICD-10-CM

## 2022-09-14 DIAGNOSIS — G43909 Migraine, unspecified, not intractable, without status migrainosus: Secondary | ICD-10-CM | POA: Diagnosis not present

## 2022-09-14 DIAGNOSIS — B360 Pityriasis versicolor: Secondary | ICD-10-CM | POA: Diagnosis not present

## 2022-09-14 DIAGNOSIS — E2839 Other primary ovarian failure: Secondary | ICD-10-CM

## 2022-09-14 DIAGNOSIS — T7840XS Allergy, unspecified, sequela: Secondary | ICD-10-CM

## 2022-09-14 DIAGNOSIS — T7840XA Allergy, unspecified, initial encounter: Secondary | ICD-10-CM | POA: Diagnosis not present

## 2022-09-14 MED ORDER — ZOLPIDEM TARTRATE 10 MG PO TABS
10.0000 mg | ORAL_TABLET | Freq: Every day | ORAL | 1 refills | Status: DC
Start: 2022-09-14 — End: 2023-04-01

## 2022-09-14 NOTE — Assessment & Plan Note (Signed)
Stable on Ambien nightly as needed.  Will refill today.

## 2022-09-14 NOTE — Progress Notes (Signed)
   Brooke Schneider is a 72 y.o. female who presents today for an office visit.  Assessment/Plan:  Chronic Problems Addressed Today: Tinea versicolor Rash on her face potentially consistent with tinea versicolor.  She can try topical ketoconazole.  She can also try her topical desonide.  Placed referral to dermatology for further evaluation and management.  Insomnia Stable on Ambien nightly as needed.  Will refill today.  Allergies Continue management per allergist.  Migraine On Topamax 50 mg in the morning 100 mg at night though she would like to try to increase to 100 mg twice daily to see if this improves the frequency of her headaches.  She is now getting a few times per week.  She will follow-up me in a couple weeks via MyChart to let me know how she is doing with the higher dose.  She has Maxalt to use as needed as well.  Does not need refills today.  Preventative health care DEXA scan ordered today.  Needs to get shingles and tetanus shot at the pharmacy.  She will come back in 3 to 6 months for annual preventative visit.  Will check labs at that time.    Subjective:  HPI:  Se A/p for status of chronic conditions.  Her main concern today is facial rash. This has been going on for a couple of years. Located above her left eyebrow and has moved into her left temple area. Tried lotrimin cream without much improvement. Tried vaseline without much improvement. No burning. No itching.        Objective:  Physical Exam: BP 136/81   Pulse 80   Temp (!) 97.5 F (36.4 C) (Temporal)   Ht 5\' 5"  (1.651 m)   Wt 206 lb 6.4 oz (93.6 kg)   SpO2 97%   BMI 34.35 kg/m   Gen: No acute distress, resting comfortably CV: Regular rate and rhythm with no murmurs appreciated Pulm: Normal work of breathing, clear to auscultation bilaterally with no crackles, wheezes, or rhonchi Skin: Faint erythematous hyperpigmented rash along left frontal area spreading into left temple.  Neuro: Grossly  normal, moves all extremities Psych: Normal affect and thought content      Porshia Blizzard M. Jimmey Ralph, MD 09/14/2022 12:22 PM

## 2022-09-14 NOTE — Assessment & Plan Note (Signed)
On Topamax 50 mg in the morning 100 mg at night though she would like to try to increase to 100 mg twice daily to see if this improves the frequency of her headaches.  She is now getting a few times per week.  She will follow-up me in a couple weeks via MyChart to let me know how she is doing with the higher dose.  She has Maxalt to use as needed as well.  Does not need refills today.

## 2022-09-14 NOTE — Patient Instructions (Addendum)
It was very nice to see you today!  Please try the [facility] for your facial rash. I will refer you to see the dermatologist.   I will refill your medications today.  We will order your bone density scan today.  Please come back in 3 to 6 months for your annual physical with labs.  Come back sooner if needed.  Take care, Dr Jimmey Ralph  PLEASE NOTE:  If you had any lab tests, please let us know if you have not heard back within a few days. You may see your results on mychart before we have a chance to review them but we will give you a call once they are reviewed by [place].   If we ordered any referrals today, please let us know if you have not heard from their office within the next week.   If you had any urgent prescriptions sent in today, please check with the pharmacy within an hour of our visit to make sure the prescription was transmitted appropriately.   Please try these tips to maintain a healthy lifestyle:  Eat at least 3 REAL meals and 1-2 snacks per day.  Aim for no more than 5 hours between eating.  If you eat breakfast, please do so within one hour of getting up.   Each meal should contain half fruits/vegetables, one quarter protein, and one quarter carbs (no bigger than a computer mouse)  Cut down on sweet beverages. This includes juice, soda, and sweet tea.   Drink at least 1 glass of water with each meal and aim for at least 8 glasses per day  Exercise at least 150 minutes every week.

## 2022-09-14 NOTE — Assessment & Plan Note (Signed)
Rash on her face potentially consistent with tinea versicolor.  She can try topical ketoconazole.  She can also try her topical desonide.  Placed referral to dermatology for further evaluation and management.

## 2022-09-14 NOTE — Assessment & Plan Note (Signed)
Continue management per allergist.  

## 2022-10-14 DIAGNOSIS — N952 Postmenopausal atrophic vaginitis: Secondary | ICD-10-CM | POA: Diagnosis not present

## 2022-10-14 DIAGNOSIS — Z6835 Body mass index (BMI) 35.0-35.9, adult: Secondary | ICD-10-CM | POA: Diagnosis not present

## 2022-10-14 DIAGNOSIS — Z01419 Encounter for gynecological examination (general) (routine) without abnormal findings: Secondary | ICD-10-CM | POA: Diagnosis not present

## 2022-10-14 DIAGNOSIS — N760 Acute vaginitis: Secondary | ICD-10-CM | POA: Diagnosis not present

## 2022-10-14 DIAGNOSIS — Z1231 Encounter for screening mammogram for malignant neoplasm of breast: Secondary | ICD-10-CM | POA: Diagnosis not present

## 2022-11-11 ENCOUNTER — Ambulatory Visit: Payer: Medicare Other | Admitting: Allergy & Immunology

## 2022-11-12 ENCOUNTER — Other Ambulatory Visit: Payer: Self-pay | Admitting: Allergy & Immunology

## 2022-11-14 ENCOUNTER — Other Ambulatory Visit: Payer: Self-pay | Admitting: Family Medicine

## 2022-11-19 ENCOUNTER — Other Ambulatory Visit: Payer: Self-pay | Admitting: Family Medicine

## 2022-11-19 DIAGNOSIS — K580 Irritable bowel syndrome with diarrhea: Secondary | ICD-10-CM

## 2022-12-14 ENCOUNTER — Ambulatory Visit: Payer: Medicare Other | Admitting: Allergy & Immunology

## 2022-12-29 ENCOUNTER — Ambulatory Visit: Payer: Medicare Other | Admitting: Family Medicine

## 2022-12-29 ENCOUNTER — Encounter: Payer: Self-pay | Admitting: Family Medicine

## 2022-12-29 VITALS — BP 124/82 | HR 76 | Temp 97.8°F | Ht 65.0 in | Wt 206.2 lb

## 2022-12-29 DIAGNOSIS — K582 Mixed irritable bowel syndrome: Secondary | ICD-10-CM

## 2022-12-29 DIAGNOSIS — K219 Gastro-esophageal reflux disease without esophagitis: Secondary | ICD-10-CM | POA: Diagnosis not present

## 2022-12-29 DIAGNOSIS — R739 Hyperglycemia, unspecified: Secondary | ICD-10-CM

## 2022-12-29 DIAGNOSIS — T7840XS Allergy, unspecified, sequela: Secondary | ICD-10-CM

## 2022-12-29 DIAGNOSIS — Z131 Encounter for screening for diabetes mellitus: Secondary | ICD-10-CM | POA: Diagnosis not present

## 2022-12-29 DIAGNOSIS — E559 Vitamin D deficiency, unspecified: Secondary | ICD-10-CM

## 2022-12-29 DIAGNOSIS — G43909 Migraine, unspecified, not intractable, without status migrainosus: Secondary | ICD-10-CM | POA: Diagnosis not present

## 2022-12-29 DIAGNOSIS — E039 Hypothyroidism, unspecified: Secondary | ICD-10-CM | POA: Diagnosis not present

## 2022-12-29 DIAGNOSIS — F5101 Primary insomnia: Secondary | ICD-10-CM

## 2022-12-29 DIAGNOSIS — Z13220 Encounter for screening for lipoid disorders: Secondary | ICD-10-CM | POA: Diagnosis not present

## 2022-12-29 DIAGNOSIS — J4540 Moderate persistent asthma, uncomplicated: Secondary | ICD-10-CM | POA: Diagnosis not present

## 2022-12-29 DIAGNOSIS — F331 Major depressive disorder, recurrent, moderate: Secondary | ICD-10-CM | POA: Diagnosis not present

## 2022-12-29 DIAGNOSIS — Z79899 Other long term (current) drug therapy: Secondary | ICD-10-CM | POA: Diagnosis not present

## 2022-12-29 LAB — COMPREHENSIVE METABOLIC PANEL
ALT: 16 U/L (ref 0–35)
AST: 17 U/L (ref 0–37)
Albumin: 4 g/dL (ref 3.5–5.2)
Alkaline Phosphatase: 49 U/L (ref 39–117)
BUN: 8 mg/dL (ref 6–23)
CO2: 24 mEq/L (ref 19–32)
Calcium: 9 mg/dL (ref 8.4–10.5)
Chloride: 109 mEq/L (ref 96–112)
Creatinine, Ser: 0.87 mg/dL (ref 0.40–1.20)
GFR: 66.65 mL/min (ref >60.00)
Glucose, Bld: 91 mg/dL (ref 70–99)
Potassium: 3.7 mEq/L (ref 3.5–5.1)
Sodium: 140 mEq/L (ref 135–145)
Total Bilirubin: 0.4 mg/dL (ref 0.2–1.2)
Total Protein: 7.1 g/dL (ref 6.0–8.3)

## 2022-12-29 LAB — LIPID PANEL
Cholesterol: 157 mg/dL (ref 0–200)
HDL: 51.5 mg/dL (ref >39.00)
LDL Cholesterol: 88 mg/dL (ref 0–99)
NonHDL: 105.32
Total CHOL/HDL Ratio: 3
Triglycerides: 87 mg/dL (ref 0.0–149.0)
VLDL: 17.4 mg/dL (ref 0.0–40.0)

## 2022-12-29 LAB — CBC
HCT: 43.2 % (ref 36.0–46.0)
Hemoglobin: 13.8 g/dL (ref 12.0–15.0)
MCHC: 31.8 g/dL (ref 30.0–36.0)
MCV: 94.8 fl (ref 78.0–100.0)
Platelets: 267 10*3/uL (ref 150.0–400.0)
RBC: 4.56 Mil/uL (ref 3.87–5.11)
RDW: 14.3 % (ref 11.5–15.5)
WBC: 5.6 10*3/uL (ref 4.0–10.5)

## 2022-12-29 LAB — HEMOGLOBIN A1C: Hgb A1c MFr Bld: 6.1 % (ref 4.6–6.5)

## 2022-12-29 LAB — VITAMIN D 25 HYDROXY (VIT D DEFICIENCY, FRACTURES): VITD: 38.8 ng/mL (ref 30.00–100.00)

## 2022-12-29 LAB — TSH: TSH: 1.44 u[IU]/mL (ref 0.35–5.50)

## 2022-12-29 LAB — VITAMIN B12: Vitamin B-12: 482 pg/mL (ref 211–911)

## 2022-12-29 MED ORDER — RIZATRIPTAN BENZOATE 10 MG PO TABS
10.0000 mg | ORAL_TABLET | ORAL | 2 refills | Status: DC | PRN
Start: 2022-12-29 — End: 2023-04-07

## 2022-12-29 MED ORDER — TOPIRAMATE 100 MG PO TABS
100.0000 mg | ORAL_TABLET | Freq: Two times a day (BID) | ORAL | 3 refills | Status: DC
Start: 2022-12-29 — End: 2023-04-15

## 2022-12-29 NOTE — Progress Notes (Signed)
Brooke Schneider is a 72 y.o. female who presents today for an office visit.  Assessment/Plan:  New/Acute Problems: Sinusitis  No red flags.  No signs of systemic infection.  She was given prescription for amoxicillin by her dentist.  She will start this.  She will let us know if not improving in the next 1 to 2 weeks.  Chronic Problems Addressed Today: Migraine Having a little more frequency of headaches.  She does have current sinus infection which could be contributing as well.  We did discuss increasing Topamax due to recent office visit however she does not get able to do this.  We will send a new prescription for Topamax 100 mg in the morning and 100 mg at night.  She can follow-up with [place] in a few weeks via MyChart to let us know how she is doing with his.  If this continues to be an issue would consider starting Aimovig or Emgality versus referral to neurology.  MDD (major depressive disorder) Overall symptoms are stable.  No SI or HI.  She does have elevated PHQ score today however states that she feels like symptoms are manageable.  She is doing well with higher dose of Wellbutrin 300 mg daily.  We did discuss increasing dose however she declined.  She does not wish to make any other medication adjustments at this point.  We also did discuss referral to see a therapist.  We discussed this last year however she does not wish to pursue that further at this point.  She will let us know if she changes her mind.  Hypothyroidism On Synthroid 125 mcg daily.  Check TSH.  GERD (gastroesophageal reflux disease) On Protonix 40 mg twice daily.  Check B12.  Allergies Follows with allergist.  On Flonase, Astelin, and hydroxyzine.  Asthma, allergic Follows with allergist.  On Advair and albuterol.  Insomnia Stable on Ambien nightly as needed.  Does not need refill today.  IBS (irritable bowel syndrome) Doing well with Bentyl 20 mg 4 times daily.  Does not need refill  today.  Preventative health care Follows with gynecology for mammogram and bone density scan.  Up-to-date on vaccines.  Up-to-date on colon cancer screening.  Will check labs today.    Subjective:  HPI:  See Assessment / plan for status of chronic conditions. She is here today for annual check up.    She is concerned that she may be having a sinus infection. Has been having more facial pain and pressure the last 1-2 weeks. Tyelnol helps. More congestion as well.  Symptoms are overall stable.  No fevers or chills.  Was recently given prescription for amoxicillin by dentist however she has not yet started this.  ROS: Per HPI, otherwise a complete review of systems was negative.   PMH:  The following were reviewed and entered/updated in epic: Past Medical History:  Diagnosis Date   Allergic rhinitis    Arthritis    Asthma    Depression    Goiter    Goiter    Headache(784.0)    Sleep apnea    Patient Active Problem List   Diagnosis Date Noted   GERD (gastroesophageal reflux disease) 07/17/2019   OA (osteoarthritis) of knee s/p TKA 2021 04/13/2019   MDD (major depressive disorder) 09/13/2016   Tinea versicolor 07/06/2016   IBS (irritable bowel syndrome) 01/02/2016   Hypothyroidism 10/17/2015   Migraine 11/27/2012   Insomnia 07/21/2009   Allergies 01/19/2008   Asthma, allergic 01/19/2008   Past  Surgical History:  Procedure Laterality Date   ABDOMINAL HYSTERECTOMY     CHOLECYSTECTOMY N/A 11/02/2013   Procedure: LAPAROSCOPIC CHOLECYSTECTOMY ;  Surgeon: Cherylynn Ridges, MD;  Location: [facility] OR;  Service: General;  Laterality: N/A;   KNEE SURGERY     THYROIDECTOMY      Family History  Problem Relation Age of Onset   Allergies Mother    Dementia Mother    ALS Father    Prostate cancer Father    Colon cancer Neg Hx    Esophageal cancer Neg Hx    Stomach cancer Neg Hx    Rectal cancer Neg Hx     Medications- reviewed and updated Current Outpatient Medications  Medication  Sig Dispense Refill   albuterol (VENTOLIN HFA) 108 (90 Base) MCG/ACT inhaler Inhale two puffs every 4-6 hours if needed for cough or wheeze 18 g 2   azelastine (ASTELIN) 0.1 % nasal spray Place 1 spray into both nostrils 2 (two) times daily as needed for rhinitis. Use in each nostril as directed 30 mL 0   bismuth subsalicylate (PEPTO BISMOL) 262 MG/15ML suspension Take 30 mLs by mouth every 6 (six) hours as needed.     buPROPion (WELLBUTRIN XL) 300 MG 24 hr tablet Take 1 tablet (300 mg total) by mouth daily. 90 tablet 3   Calcium Carbonate-Vitamin D 600-400 MG-UNIT tablet Take 1 tablet by mouth 2 (two) times daily.     clotrimazole (LOTRIMIN) 1 % cream Apply topically.     desonide (DESOWEN) 0.05 % cream APPLY TO AFFECTED AREA TWICE A DAY. 30 g 0   dicyclomine (BENTYL) 20 MG tablet TAKE 1 TABLET FOUR TIMES A DAY BEFORE MEALS AND AT BEDTIME 90 tablet 2   fluticasone (FLONASE) 50 MCG/ACT nasal spray One spray each nostril daily as needed 48 g 1   fluticasone-salmeterol (ADVAIR) 500-50 MCG/ACT AEPB Inhale 1 puff into the lungs in the morning and at bedtime. 60 each 5   hydrocortisone 2.5 % ointment Apply topically 2 (two) times daily. 454 g 1   hydrOXYzine (ATARAX) 25 MG tablet Take 1 tablet (25 mg total) by mouth 2 (two) times daily as needed. 60 tablet 5   ketoconazole (NIZORAL) 2 % shampoo Apply 1 application. topically 2 (two) times a week. To affected areas on skin 120 mL 2   levothyroxine (SYNTHROID) 125 MCG tablet TAKE ONE TABLET BY MOUTH DAILY 90 tablet 3   Multiple Vitamins-Minerals (MULTIVITAMIN WITH MINERALS) tablet Take 1 tablet by mouth daily.     pantoprazole (PROTONIX) 40 MG tablet Take 1 tablet (40 mg total) by mouth 2 (two) times daily. 180 tablet 3   Spacer/Aero-Holding Chambers (AEROCHAMBER PLUS) inhaler Use as instructed 1 each 2   zolpidem (AMBIEN) 10 MG tablet Take 1 tablet (10 mg total) by mouth at bedtime. 90 tablet 1   rizatriptan (MAXALT) 10 MG tablet Take 1 tablet (10 mg  total) by mouth as needed for migraine. May repeat in 2 hours if needed 18 tablet 2   topiramate (TOPAMAX) 100 MG tablet Take 1 tablet (100 mg total) by mouth 2 (two) times daily. 180 tablet 3   No current facility-administered medications for this visit.    Allergies-reviewed and updated Allergies  Allergen Reactions   Sulfa Antibiotics    Sulfamethoxazole-Trimethoprim Swelling   Metronidazole Itching and Rash    Social History   Socioeconomic History   Marital status: Married    Spouse name: [name]   Number of children: 4   Years  of education: college   Highest education level: Not on file  Occupational History   Occupation: housewife  Tobacco Use   Smoking status: Never    Passive exposure: Yes   Smokeless tobacco: Never   Tobacco comments:    husband smoking cigars  Vaping Use   Vaping status: Never Used  Substance and Sexual Activity   Alcohol use: Not Currently    Comment: occ   Drug use: Never   Sexual activity: Not Currently  Other Topics Concern   Not on file  Social History Narrative   Patient lives at home with her husband [name]).   Retired.   Education college B.S.   Right handed.   Caffeine one cup of coffee daily.   Social Determinants of Health   Financial Resource Strain: Low Risk  (11/23/2021)   Overall Financial Resource Strain (CARDIA)    Difficulty of Paying Living Expenses: Not hard at all  Food Insecurity: No Food Insecurity (11/23/2021)   Hunger Vital Sign    Worried About Running Out of Food in the Last Year: Never true    Ran Out of Food in the Last Year: Never true  Transportation Needs: No Transportation Needs (11/23/2021)   PRAPARE - Administrator, Civil Service (Medical): No    Lack of Transportation (Non-Medical): No  Physical Activity: Inactive (11/23/2021)   Exercise Vital Sign    Days of Exercise per Week: 0 days    Minutes of Exercise per Session: 0 min  Stress: Stress Concern Present (11/23/2021)   [employer] of Occupational Health - Occupational Stress Questionnaire    Feeling of Stress : To some extent  Social Connections: Moderately Isolated (11/23/2021)   Social Connection and Isolation Panel [NHANES]    Frequency of Communication with Friends and Family: More than three times a week    Frequency of Social Gatherings with Friends and Family: Twice a week    Attends Religious Services: Never    Database administrator or Organizations: No    Attends Engineer, structural: Never    Marital Status: Married          Objective:  Physical Exam: BP 124/82   Pulse 76   Temp 97.8 F (36.6 C) (Temporal)   Ht 5\' 5"  (1.651 m)   Wt 206 lb 3.2 oz (93.5 kg)   SpO2 98%   BMI 34.31 kg/m   Gen: No acute distress, resting comfortably HEENT: TMs are clear effusion.  OP erythematous. CV: Regular rate and rhythm with no murmurs appreciated Pulm: Normal work of breathing, clear to auscultation bilaterally with no crackles, wheezes, or rhonchi Neuro: Grossly normal, moves all extremities Psych: Normal affect and thought content      Mirta Mally M. Jimmey Ralph, MD 12/29/2022 9:28 AM

## 2022-12-29 NOTE — Patient Instructions (Signed)
It was very nice to see you today!  We will check blood work today.  Please continue to work on diet and exercise.  Will increase her Topamax to 100 mg twice daily.  Please let us know if you need to send in a full prescription for amoxicillin.  Return in about 1 year (around 12/29/2023) for Annual Physical.   Take care, Dr Jimmey Ralph  PLEASE NOTE:  If you had any lab tests, please let us know if you have not heard back within a few days. You may see your results on mychart before we have a chance to review them but we will give you a call once they are reviewed by [place].   If we ordered any referrals today, please let us know if you have not heard from their office within the next week.   If you had any urgent prescriptions sent in today, please check with the pharmacy within an hour of our visit to make sure the prescription was transmitted appropriately.   Please try these tips to maintain a healthy lifestyle:  Eat at least 3 REAL meals and 1-2 snacks per day.  Aim for no more than 5 hours between eating.  If you eat breakfast, please do so within one hour of getting up.   Each meal should contain half fruits/vegetables, one quarter protein, and one quarter carbs (no bigger than a computer mouse)  Cut down on sweet beverages. This includes juice, soda, and sweet tea.   Drink at least 1 glass of water with each meal and aim for at least 8 glasses per day  Exercise at least 150 minutes every week.

## 2022-12-29 NOTE — Assessment & Plan Note (Signed)
On Synthroid 125 mcg daily.  Check TSH. 

## 2022-12-29 NOTE — Assessment & Plan Note (Signed)
Doing well with Bentyl 20 mg 4 times daily.  Does not need refill today.

## 2022-12-29 NOTE — Assessment & Plan Note (Signed)
Stable on Ambien nightly as needed.  Does not need refill today.

## 2022-12-29 NOTE — Assessment & Plan Note (Signed)
Overall symptoms are stable.  No SI or HI.  She does have elevated PHQ score today however states that she feels like symptoms are manageable.  She is doing well with higher dose of Wellbutrin 300 mg daily.  We did discuss increasing dose however she declined.  She does not wish to make any other medication adjustments at this point.  We also did discuss referral to see a therapist.  We discussed this last year however she does not wish to pursue that further at this point.  She will let us know if she changes her mind.

## 2022-12-29 NOTE — Assessment & Plan Note (Signed)
Having a little more frequency of headaches.  She does have current sinus infection which could be contributing as well.  We did discuss increasing Topamax due to recent office visit however she does not get able to do this.  We will send a new prescription for Topamax 100 mg in the morning and 100 mg at night.  She can follow-up with [place] in a few weeks via MyChart to let us know how she is doing with his.  If this continues to be an issue would consider starting Aimovig or Emgality versus referral to neurology.

## 2022-12-29 NOTE — Assessment & Plan Note (Signed)
Follows with allergist.  On Advair and albuterol.

## 2022-12-29 NOTE — Assessment & Plan Note (Signed)
Follows with allergist.  On Flonase, Astelin, and hydroxyzine.

## 2022-12-29 NOTE — Assessment & Plan Note (Signed)
On Protonix 40 mg twice daily.  Check B12.

## 2023-01-01 ENCOUNTER — Other Ambulatory Visit: Payer: Self-pay | Admitting: Allergy & Immunology

## 2023-01-03 ENCOUNTER — Encounter: Payer: Self-pay | Admitting: Family Medicine

## 2023-01-03 DIAGNOSIS — R7303 Prediabetes: Secondary | ICD-10-CM | POA: Insufficient documentation

## 2023-01-03 NOTE — Progress Notes (Signed)
Labs are all stable.  Do not need to make any changes to her treatment plan at this time.  She should continue to work on diet and exercise and we can recheck everything in a year or so.

## 2023-01-04 ENCOUNTER — Ambulatory Visit (INDEPENDENT_AMBULATORY_CARE_PROVIDER_SITE_OTHER): Payer: Medicare Other

## 2023-01-04 VITALS — BP 140/84 | HR 98 | Temp 98.7°F | Wt 206.8 lb

## 2023-01-04 DIAGNOSIS — F331 Major depressive disorder, recurrent, moderate: Secondary | ICD-10-CM

## 2023-01-04 DIAGNOSIS — Z0000 Encounter for general adult medical examination without abnormal findings: Secondary | ICD-10-CM

## 2023-01-04 NOTE — Patient Instructions (Signed)
[name] , Thank you for taking time to come for your Medicare Wellness Visit. I appreciate your ongoing commitment to your health goals. Please review the following plan we discussed and let me know if I can assist you in the future.   Referrals/Orders/Follow-Ups/Clinician Recommendations: get back to the [facility]   This is a list of the screening recommended for you and due dates:  Health Maintenance  Topic Date Due   DEXA scan (bone density measurement)  Never done   Mammogram  06/16/2022   Medicare Annual Wellness Visit  11/24/2022   Flu Shot  01/06/2023   Colon Cancer Screening  08/06/2031   DTaP/Tdap/Td vaccine (4 - Td or Tdap) 09/13/2032   Pneumonia Vaccine  Completed   COVID-19 Vaccine  Completed   Hepatitis C Screening  Completed   Zoster (Shingles) Vaccine  Completed   HPV Vaccine  Aged Out    Advanced directives: (Provided) Advance directive discussed with you today. I have provided a copy for you to complete at home and have notarized. Once this is complete, please bring a copy in to our office so we can scan it into your chart.   Next Medicare Annual Wellness Visit scheduled for next year: Yes  Preventive Care 63 Years and Older, Female Preventive care refers to lifestyle choices and visits with your health care provider that can promote health and wellness. What does preventive care include? A yearly physical exam. This is also called an annual well check. Dental exams once or twice a year. Routine eye exams. Ask your health care provider how often you should have your eyes checked. Personal lifestyle choices, including: Daily care of your teeth and gums. Regular physical activity. Eating a healthy diet. Avoiding tobacco and drug use. Limiting alcohol use. Practicing safe sex. Taking low-dose aspirin every day. Taking vitamin and mineral supplements as recommended by your health care provider. What happens during an annual well check? The services and screenings  done by your health care provider during your annual well check will depend on your age, overall health, lifestyle risk factors, and family history of disease. Counseling  Your health care provider may ask you questions about your: Alcohol use. Tobacco use. Drug use. Emotional well-being. Home and relationship well-being. Sexual activity. Eating habits. History of falls. Memory and ability to understand (cognition). Work and work Astronomer. Reproductive health. Screening  You may have the following tests or measurements: Height, weight, and BMI. Blood pressure. Lipid and cholesterol levels. These may be checked every 5 years, or more frequently if you are over 48 years old. Skin check. Lung cancer screening. You may have this screening every year starting at age 71 if you have a 30-pack-year history of smoking and currently smoke or have quit within the past 15 years. Fecal occult blood test (FOBT) of the stool. You may have this test every year starting at age 35. Flexible sigmoidoscopy or colonoscopy. You may have a sigmoidoscopy every 5 years or a colonoscopy every 10 years starting at age 17. Hepatitis C blood test. Hepatitis B blood test. Sexually transmitted disease (STD) testing. Diabetes screening. This is done by checking your blood sugar (glucose) after you have not eaten for a while (fasting). You may have this done every 1-3 years. Bone density scan. This is done to screen for osteoporosis. You may have this done starting at age 34. Mammogram. This may be done every 1-2 years. Talk to your health care provider about how often you should have regular mammograms.  Talk with your health care provider about your test results, treatment options, and if necessary, the need for more tests. Vaccines  Your health care provider may recommend certain vaccines, such as: Influenza vaccine. This is recommended every year. Tetanus, diphtheria, and acellular pertussis (Tdap, Td)  vaccine. You may need a Td booster every 10 years. Zoster vaccine. You may need this after age 56. Pneumococcal 13-valent conjugate (PCV13) vaccine. One dose is recommended after age 2. Pneumococcal polysaccharide (PPSV23) vaccine. One dose is recommended after age 103. Talk to your health care provider about which screenings and vaccines you need and how often you need them. This information is not intended to replace advice given to you by your health care provider. Make sure you discuss any questions you have with your health care provider. Document Released: 06/20/2015 Document Revised: 02/11/2016 Document Reviewed: 03/25/2015 Elsevier Interactive Patient Education  2017 [employer].  Fall Prevention in the Home Falls can cause injuries. They can happen to people of all ages. There are many things you can do to make your home safe and to help prevent falls. What can I do on the outside of my home? Regularly fix the edges of walkways and driveways and fix any cracks. Remove anything that might make you trip as you walk through a door, such as a raised step or threshold. Trim any bushes or trees on the path to your home. Use bright outdoor lighting. Clear any walking paths of anything that might make someone trip, such as rocks or tools. Regularly check to see if handrails are loose or broken. Make sure that both sides of any steps have handrails. Any raised decks and porches should have guardrails on the edges. Have any leaves, snow, or ice cleared regularly. Use sand or salt on walking paths during winter. Clean up any spills in your garage right away. This includes oil or grease spills. What can I do in the bathroom? Use night lights. Install grab bars by the toilet and in the tub and shower. Do not use towel bars as grab bars. Use non-skid mats or decals in the tub or shower. If you need to sit down in the shower, use a plastic, non-slip stool. Keep the floor dry. Clean up any  water that spills on the floor as soon as it happens. Remove soap buildup in the tub or shower regularly. Attach bath mats securely with double-sided non-slip rug tape. Do not have throw rugs and other things on the floor that can make you trip. What can I do in the bedroom? Use night lights. Make sure that you have a light by your bed that is easy to reach. Do not use any sheets or blankets that are too big for your bed. They should not hang down onto the floor. Have a firm chair that has side arms. You can use this for support while you get dressed. Do not have throw rugs and other things on the floor that can make you trip. What can I do in the kitchen? Clean up any spills right away. Avoid walking on wet floors. Keep items that you use a lot in easy-to-reach places. If you need to reach something above you, use a strong step stool that has a grab bar. Keep electrical cords out of the way. Do not use floor polish or wax that makes floors slippery. If you must use wax, use non-skid floor wax. Do not have throw rugs and other things on the floor that can make  you trip. What can I do with my stairs? Do not leave any items on the stairs. Make sure that there are handrails on both sides of the stairs and use them. Fix handrails that are broken or loose. Make sure that handrails are as long as the stairways. Check any carpeting to make sure that it is firmly attached to the stairs. Fix any carpet that is loose or worn. Avoid having throw rugs at the top or bottom of the stairs. If you do have throw rugs, attach them to the floor with carpet tape. Make sure that you have a light switch at the top of the stairs and the bottom of the stairs. If you do not have them, ask someone to add them for you. What else can I do to help prevent falls? Wear shoes that: Do not have high heels. Have rubber bottoms. Are comfortable and fit you well. Are closed at the toe. Do not wear sandals. If you use a  stepladder: Make sure that it is fully opened. Do not climb a closed stepladder. Make sure that both sides of the stepladder are locked into place. Ask someone to hold it for you, if possible. Clearly mark and make sure that you can see: Any grab bars or handrails. First and last steps. Where the edge of each step is. Use tools that help you move around (mobility aids) if they are needed. These include: Canes. Walkers. Scooters. Crutches. Turn on the lights when you go into a dark area. Replace any light bulbs as soon as they burn out. Set up your furniture so you have a clear path. Avoid moving your furniture around. If any of your floors are uneven, fix them. If there are any pets around you, be aware of where they are. Review your medicines with your doctor. Some medicines can make you feel dizzy. This can increase your chance of falling. Ask your doctor what other things that you can do to help prevent falls. This information is not intended to replace advice given to you by your health care provider. Make sure you discuss any questions you have with your health care provider. Document Released: 03/20/2009 Document Revised: 10/30/2015 Document Reviewed: 06/28/2014 Elsevier Interactive Patient Education  2017 [employer].

## 2023-01-04 NOTE — Progress Notes (Signed)
Subjective:   Brooke Schneider is a 72 y.o. female who presents for Medicare Annual (Subsequent) preventive examination.  Visit Complete: In person   Review of Systems     Cardiac Risk Factors include: advanced age (>30men, >35 women);obesity (BMI >30kg/m2)     Objective:    Today's Vitals   01/04/23 1419 01/04/23 1420  BP: (!) 154/86 (!) 140/84  Pulse:  98  Temp:  98.7 F (37.1 C)  SpO2:  99%  Weight:  206 lb 12.8 oz (93.8 kg)   Body mass index is 34.41 kg/m.     01/04/2023    2:16 PM 12/22/2021    1:06 PM 11/23/2021   11:20 AM 11/09/2021   11:48 AM 09/05/2020    2:45 PM 03/13/2019    3:07 PM 02/20/2018   10:29 AM  Advanced Directives  Does Patient Have a Medical Advance Directive? No No No No Yes No No  Does patient want to make changes to medical advance directive?     Yes (MAU/Ambulatory/Procedural Areas - Information given)    Would patient like information on creating a medical advance directive? Yes (MAU/Ambulatory/Procedural Areas - Information given) Yes (MAU/Ambulatory/Procedural Areas - Information given) Yes (MAU/Ambulatory/Procedural Areas - Information given) Yes (MAU/Ambulatory/Procedural Areas - Information given)  Yes (MAU/Ambulatory/Procedural Areas - Information given) Yes (Inpatient - patient requests chaplain consult to create a medical advance directive)    Current Medications (verified) Outpatient Encounter Medications as of 01/04/2023  Medication Sig   albuterol (VENTOLIN HFA) 108 (90 Base) MCG/ACT inhaler Inhale two puffs every 4-6 hours if needed for cough or wheeze   azelastine (ASTELIN) 0.1 % nasal spray Place 1 spray into both nostrils 2 (two) times daily as needed for rhinitis. Use in each nostril as directed   bismuth subsalicylate (PEPTO BISMOL) 262 MG/15ML suspension Take 30 mLs by mouth every 6 (six) hours as needed.   buPROPion (WELLBUTRIN XL) 300 MG 24 hr tablet Take 1 tablet (300 mg total) by mouth daily.   Calcium Carbonate-Vitamin D  600-400 MG-UNIT tablet Take 1 tablet by mouth 2 (two) times daily.   clotrimazole (LOTRIMIN) 1 % cream Apply topically.   desonide (DESOWEN) 0.05 % cream APPLY TO AFFECTED AREA TWICE A DAY.   dicyclomine (BENTYL) 20 MG tablet TAKE 1 TABLET FOUR TIMES A DAY BEFORE MEALS AND AT BEDTIME   fluticasone (FLONASE) 50 MCG/ACT nasal spray One spray each nostril daily as needed   fluticasone-salmeterol (ADVAIR) 500-50 MCG/ACT AEPB Inhale 1 puff into the lungs in the morning and at bedtime.   hydrocortisone 2.5 % ointment Apply topically 2 (two) times daily.   hydrOXYzine (ATARAX) 25 MG tablet Take 1 tablet (25 mg total) by mouth 2 (two) times daily as needed.   ketoconazole (NIZORAL) 2 % shampoo Apply 1 application. topically 2 (two) times a week. To affected areas on skin   levothyroxine (SYNTHROID) 125 MCG tablet TAKE ONE TABLET BY MOUTH DAILY   Multiple Vitamins-Minerals (MULTIVITAMIN WITH MINERALS) tablet Take 1 tablet by mouth daily.   pantoprazole (PROTONIX) 40 MG tablet Take 1 tablet (40 mg total) by mouth 2 (two) times daily.   rizatriptan (MAXALT) 10 MG tablet Take 1 tablet (10 mg total) by mouth as needed for migraine. May repeat in 2 hours if needed   Spacer/Aero-Holding Chambers (AEROCHAMBER PLUS) inhaler Use as instructed   Tdap (BOOSTRIX) 5-2.5-18.5 LF-MCG/0.5 injection    topiramate (TOPAMAX) 100 MG tablet Take 1 tablet (100 mg total) by mouth 2 (two) times daily.  zolpidem (AMBIEN) 10 MG tablet Take 1 tablet (10 mg total) by mouth at bedtime.   Zoster Vaccine Adjuvanted [facility]) injection    No facility-administered encounter medications on file as of 01/04/2023.    Allergies (verified) Sulfa antibiotics, Sulfamethoxazole-trimethoprim, and Metronidazole   History: Past Medical History:  Diagnosis Date   Allergic rhinitis    Arthritis    Asthma    Depression    Goiter    Goiter    Headache(784.0)    Sleep apnea    Past Surgical History:  Procedure Laterality Date    ABDOMINAL HYSTERECTOMY     CHOLECYSTECTOMY N/A 11/02/2013   Procedure: LAPAROSCOPIC CHOLECYSTECTOMY ;  Surgeon: Cherylynn Ridges, MD;  Location: [facility] OR;  Service: General;  Laterality: N/A;   KNEE SURGERY     THYROIDECTOMY     Family History  Problem Relation Age of Onset   Allergies Mother    Dementia Mother    ALS Father    Prostate cancer Father    Colon cancer Neg Hx    Esophageal cancer Neg Hx    Stomach cancer Neg Hx    Rectal cancer Neg Hx    Social History   Socioeconomic History   Marital status: Married    Spouse name: [name]   Number of children: 4   Years of education: college   Highest education level: Not on file  Occupational History   Occupation: housewife  Tobacco Use   Smoking status: Never    Passive exposure: Yes   Smokeless tobacco: Never   Tobacco comments:    husband smoking cigars  Vaping Use   Vaping status: Never Used  Substance and Sexual Activity   Alcohol use: Not Currently    Comment: occ   Drug use: Never   Sexual activity: Not Currently  Other Topics Concern   Not on file  Social History Narrative   Patient lives at home with her husband [name]).   Retired.   Education college B.S.   Right handed.   Caffeine one cup of coffee daily.   Social Determinants of Health   Financial Resource Strain: Low Risk  (01/04/2023)   Overall Financial Resource Strain (CARDIA)    Difficulty of Paying Living Expenses: Not hard at all  Food Insecurity: No Food Insecurity (01/04/2023)   Hunger Vital Sign    Worried About Running Out of Food in the Last Year: Never true    Ran Out of Food in the Last Year: Never true  Transportation Needs: No Transportation Needs (01/04/2023)   PRAPARE - Administrator, Civil Service (Medical): No    Lack of Transportation (Non-Medical): No  Physical Activity: Inactive (01/04/2023)   Exercise Vital Sign    Days of Exercise per Week: 0 days    Minutes of Exercise per Session: 0 min  Stress: Stress Concern  Present (01/04/2023)   [employer] of Occupational Health - Occupational Stress Questionnaire    Feeling of Stress : Rather much  Social Connections: Moderately Isolated (01/04/2023)   Social Connection and Isolation Panel [NHANES]    Frequency of Communication with Friends and Family: More than three times a week    Frequency of Social Gatherings with Friends and Family: Twice a week    Attends Religious Services: Never    Database administrator or Organizations: No    Attends Banker Meetings: Never    Marital Status: Married    Tobacco Counseling Counseling given: Not Answered Tobacco comments:  husband smoking cigars   Clinical Intake:  Pre-visit preparation completed: Yes  Pain : No/denies pain     BMI - recorded: 34.31 Nutritional Status: BMI > 30  Obese Nutritional Risks: None Diabetes: No  How often do you need to have someone help you when you read instructions, pamphlets, or other written materials from your doctor or pharmacy?: 1 - Never  Interpreter Needed?: No  Information entered by :: Lanier Ensign, LPN   Activities of Daily Living    01/04/2023    2:03 PM  In your present state of health, do you have any difficulty performing the following activities:  Hearing? 1  Comment slight HOH  Vision? 1  Comment pt stated she will make an appt  Difficulty concentrating or making decisions? 0  Walking or climbing stairs? 0  Dressing or bathing? 0  Doing errands, shopping? 0  Preparing Food and eating ? N  Using the Toilet? N  In the past six months, have you accidently leaked urine? Y  Comment following up with GYN  Do you have problems with loss of bowel control? N  Managing your Medications? N  Managing your Finances? N  Housekeeping or managing your Housekeeping? N    Patient Care Team: Ardith Dark, MD as PCP - General (Family Medicine) Nilda Riggs, NP as Nurse Practitioner (Family Medicine) Lucie Leather, Alvira Philips, MD as  Consulting Physician (Allergy and Immunology) Harold Hedge, MD as Consulting Physician (Obstetrics and Gynecology)  Indicate any recent Medical Services you may have received from other than [facility] providers in the past year (date may be approximate).     Assessment:   This is a routine wellness examination for [place].  Hearing/Vision screen Hearing Screening - Comments:: Pt stated she has slight HOH  Vision Screening - Comments:: Pt will follow up with Dr Dione Booze   Dietary issues and exercise activities discussed:     Goals Addressed             This Visit's Progress    Patient Stated       [facility] to [facility]        Depression Screen    01/04/2023    2:09 PM 12/29/2022    8:56 AM 09/14/2022   12:18 PM 02/15/2022    3:01 PM 11/23/2021   11:19 AM 11/09/2021    8:29 AM 08/31/2021    1:48 PM  PHQ 2/9 Scores  PHQ - 2 Score 2 4 2 4  0 0 2  PHQ- 9 Score 7 12 10 19   7     Fall Risk    01/04/2023    2:17 PM 12/29/2022    8:56 AM 09/14/2022   11:24 AM 02/15/2022    2:26 PM 11/23/2021   11:21 AM  Fall Risk   Falls in the past year? 0 0 0 0 0  Number falls in past yr: 0 0 0 0 0  Injury with Fall? 0 0 0 0 0  Risk for fall due to : Impaired vision No Fall Risks No Fall Risks No Fall Risks Impaired vision  Follow up Falls prevention discussed    Falls prevention discussed    MEDICARE RISK AT HOME:  Medicare Risk at Home - 01/04/23 1418     Any stairs in or around the home? Yes    If so, are there any without handrails? No    Home free of loose throw rugs in walkways, pet beds, electrical cords, etc? Yes  Adequate lighting in your home to reduce risk of falls? Yes    Life alert? No    Use of a cane, walker or w/c? No    Grab bars in the bathroom? No    Shower chair or bench in shower? Yes    Elevated toilet seat or a handicapped toilet? No             TIMED UP AND GO:  Was the test performed?  No    Cognitive Function:        01/04/2023    2:20 PM 11/26/2021    4:55  PM 09/05/2020    2:48 PM 02/20/2018   10:33 AM  6CIT Screen  What Year? 0 points 0 points 0 points 0 points  What month? 0 points 0 points 0 points 0 points  What time? 0 points 0 points  3 points  Count back from 20 0 points 0 points 0 points 0 points  Months in reverse 0 points 0 points 0 points 0 points  Repeat phrase 6 points 2 points 2 points 0 points  Total Score 6 points 2 points  3 points    Immunizations Immunization History  Administered Date(s) Administered   Fluad Quad(high Dose 65+) 03/13/2019, 03/03/2021   Influenza Split 02/22/2011   Influenza Whole 04/27/2010   Influenza, High Dose Seasonal PF 05/20/2017   Influenza,inj,Quad PF,6+ Mos 05/16/2015, 02/10/2016   Influenza-Unspecified 05/14/2009, 04/27/2010, 05/16/2015, 04/07/2020   PFIZER(Purple Top)SARS-COV-2 Vaccination 08/15/2019, 09/05/2019, 07/07/2020   Pfizer Covid-19 Vaccine Bivalent Booster 27yrs & up 05/23/2021   Pneumococcal Conjugate-13 06/14/2016   Pneumococcal Polysaccharide-23 06/07/2004   Td 07/12/2006   Tdap 07/12/2006, 09/14/2022   Zoster Recombinant(Shingrix) 09/14/2022, 12/20/2022   Zoster, Live 08/05/2011    TDAP status: Up to date  Flu Vaccine status: Due, Education has been provided regarding the importance of this vaccine. Advised may receive this vaccine at local pharmacy or Health Dept. Aware to provide a copy of the vaccination record if obtained from local pharmacy or Health Dept. Verbalized acceptance and understanding.  Pneumococcal vaccine status: Up to date  Covid-19 vaccine status: Completed vaccines  Qualifies for Shingles Vaccine? Yes   Zostavax completed Yes   Shingrix Completed?: Yes  Screening Tests Health Maintenance  Topic Date Due   DEXA SCAN  Never done   MAMMOGRAM  06/16/2022   INFLUENZA VACCINE  01/06/2023   Medicare Annual Wellness (AWV)  01/04/2024   Colonoscopy  08/06/2031   DTaP/Tdap/Td (4 - Td or Tdap) 09/13/2032   Pneumonia Vaccine 53+ Years old  Completed    COVID-19 Vaccine  Completed   Hepatitis C Screening  Completed   Zoster Vaccines- Shingrix  Completed   HPV VACCINES  Aged Out    Health Maintenance  Health Maintenance Due  Topic Date Due   DEXA SCAN  Never done   MAMMOGRAM  06/16/2022    Colorectal cancer screening: Type of screening: Colonoscopy. Completed 08/05/21. Repeat every 10 years  Mammogram status: Completed 06/16/21. Repeat every year  Bone Density status: Completed 09/14/22. Results reflect: Bone density results: NORMAL. Repeat every 2 years.   Additional Screening:  Hepatitis C Screening:  Completed 02/20/18  Vision Screening: Recommended annual ophthalmology exams for early detection of glaucoma and other disorders of the eye. Is the patient up to date with their annual eye exam?  No  Who is the provider or what is the name of the office in which the patient attends annual eye exams? Dr Dione Booze  If pt  is not established with a provider, would they like to be referred to a provider to establish care? No .   Dental Screening: Recommended annual dental exams for proper oral hygiene    Community Resource Referral / Chronic Care Management: CRR required this visit?  No   CCM required this visit?  No     Plan:     I have personally reviewed and noted the following in the patient's chart:   Medical and social history Use of alcohol, tobacco or illicit drugs  Current medications and supplements including opioid prescriptions. Patient is not currently taking opioid prescriptions. Functional ability and status Nutritional status Physical activity Advanced directives List of other physicians Hospitalizations, surgeries, and ER visits in previous 12 months Vitals Screenings to include cognitive, depression, and falls Referrals and appointments  In addition, I have reviewed and discussed with patient certain preventive protocols, quality metrics, and best practice recommendations. A written personalized care  plan for preventive services as well as general preventive health recommendations were provided to patient.     Marzella Schlein, LPN   2/44/0102   After Visit Summary: (MyChart) Due to this being a telephonic visit, the after visit summary with patients personalized plan was offered to patient via MyChart   Nurse Notes: Pt is requesting family counseling services due to depression and marital concerns

## 2023-01-04 NOTE — Addendum Note (Signed)
Addended by: Ardith Dark on: 01/04/2023 02:46 PM   Modules accepted: Orders

## 2023-02-04 ENCOUNTER — Encounter: Payer: Self-pay | Admitting: Family Medicine

## 2023-02-04 ENCOUNTER — Ambulatory Visit: Payer: Medicare Other | Admitting: Family Medicine

## 2023-02-04 VITALS — BP 116/76 | HR 81 | Temp 97.7°F | Wt 204.0 lb

## 2023-02-04 DIAGNOSIS — R7303 Prediabetes: Secondary | ICD-10-CM

## 2023-02-04 DIAGNOSIS — J029 Acute pharyngitis, unspecified: Secondary | ICD-10-CM | POA: Diagnosis not present

## 2023-02-04 DIAGNOSIS — B360 Pityriasis versicolor: Secondary | ICD-10-CM | POA: Diagnosis not present

## 2023-02-04 DIAGNOSIS — G43909 Migraine, unspecified, not intractable, without status migrainosus: Secondary | ICD-10-CM | POA: Diagnosis not present

## 2023-02-04 LAB — POCT RAPID STREP A (OFFICE): Rapid Strep A Screen: POSITIVE — AB

## 2023-02-04 LAB — POC COVID19 BINAXNOW: SARS Coronavirus 2 Ag: NEGATIVE

## 2023-02-04 MED ORDER — AMOXICILLIN 500 MG PO CAPS: 500.0000 mg | ORAL_CAPSULE | Freq: Two times a day (BID) | ORAL | 0 refills | Status: AC

## 2023-02-04 NOTE — Assessment & Plan Note (Signed)
Symptoms are stable since our visit.  We recently increase Topamax to 100 mg twice daily which does seem to be working well.

## 2023-02-04 NOTE — Assessment & Plan Note (Signed)
Has referral to dermatology pending.  Has been using desonide and ketoconazole without much improvement.  We gave contact information today for her to call to schedule appointment.

## 2023-02-04 NOTE — Patient Instructions (Addendum)
It was very nice to see you today!  Your strep test is positive. Please start the amoxicillin. Let me know if not improving.   Please call the dermatologist at [phone].   Return if symptoms worsen or fail to improve.   Take care, Dr Jimmey Ralph  PLEASE NOTE:  If you had any lab tests, please let us know if you have not heard back within a few days. You may see your results on mychart before we have a chance to review them but we will give you a call once they are reviewed by [place].   If we ordered any referrals today, please let us know if you have not heard from their office within the next week.   If you had any urgent prescriptions sent in today, please check with the pharmacy within an hour of our visit to make sure the prescription was transmitted appropriately.   Please try these tips to maintain a healthy lifestyle:  Eat at least 3 REAL meals and 1-2 snacks per day.  Aim for no more than 5 hours between eating.  If you eat breakfast, please do so within one hour of getting up.   Each meal should contain half fruits/vegetables, one quarter protein, and one quarter carbs (no bigger than a computer mouse)  Cut down on sweet beverages. This includes juice, soda, and sweet tea.   Drink at least 1 glass of water with each meal and aim for at least 8 glasses per day  Exercise at least 150 minutes every week.

## 2023-02-04 NOTE — Progress Notes (Signed)
   Brooke Schneider is a 72 y.o. female who presents today for an office visit.  Assessment/Plan:  New/Acute Problems: Sore Throat Rapid strep positive. No red flags. Will start amoxicillin 500 mg twice daily for 10 days. Encouraged hydration. She can continue OTC medications as needed.  We discussed reasons to return to care or seek emergent care.  Follow-up as needed.  Chronic Problems Addressed Today: Tinea versicolor Has referral to dermatology pending.  Has been using desonide and ketoconazole without much improvement.  We gave contact information today for her to call to schedule appointment.  Migraine Symptoms are stable since our visit.  We recently increase Topamax to 100 mg twice daily which does seem to be working well.     Subjective:  HPI:  See A/P for status of chronic condition.  Patient is here with several weeks of sore throat and hoarse voice.  Also getting some intermittent dizziness for the last week or so.  Some drainage. Some fatigue. No fevers or chills. She did take some amoxicillin that she got form her dentist but is not sure if it helped.  Some decreased appetite. No known sick contacts. No treatments tried.        Objective:  Physical Exam: BP 116/76   Pulse 81   Temp 97.7 F (36.5 C) (Temporal)   Wt 204 lb (92.5 kg)   SpO2 96%   BMI 33.95 kg/m   Gen: No acute distress, resting comfortably HEENT: TMs clear.  OP erythematous. CV: Regular rate and rhythm with no murmurs appreciated Pulm: Normal work of breathing, clear to auscultation bilaterally with no crackles, wheezes, or rhonchi Neuro: Grossly normal, moves all extremities Psych: Normal affect and thought content      Brooke Schneider M. Jimmey Ralph, MD 02/04/2023 11:38 AM

## 2023-02-08 ENCOUNTER — Ambulatory Visit: Payer: Medicare Other | Admitting: Allergy & Immunology

## 2023-02-08 ENCOUNTER — Other Ambulatory Visit: Payer: Self-pay

## 2023-02-08 ENCOUNTER — Encounter: Payer: Self-pay | Admitting: Allergy & Immunology

## 2023-02-08 VITALS — BP 118/80 | HR 77 | Temp 98.1°F | Ht 65.0 in | Wt 206.3 lb

## 2023-02-08 DIAGNOSIS — L299 Pruritus, unspecified: Secondary | ICD-10-CM | POA: Diagnosis not present

## 2023-02-08 DIAGNOSIS — J4540 Moderate persistent asthma, uncomplicated: Secondary | ICD-10-CM

## 2023-02-08 DIAGNOSIS — J3089 Other allergic rhinitis: Secondary | ICD-10-CM

## 2023-02-08 DIAGNOSIS — K219 Gastro-esophageal reflux disease without esophagitis: Secondary | ICD-10-CM | POA: Diagnosis not present

## 2023-02-08 MED ORDER — FLUTICASONE PROPIONATE 50 MCG/ACT NA SUSP: NASAL | 1 refills | Status: DC

## 2023-02-08 MED ORDER — AZELASTINE HCL 0.1 % NA SOLN: 1.0000 | Freq: Two times a day (BID) | NASAL | 1 refills | Status: DC | PRN

## 2023-02-08 MED ORDER — HYDROXYZINE HCL 25 MG PO TABS: 25.0000 mg | ORAL_TABLET | Freq: Two times a day (BID) | ORAL | 1 refills | Status: AC | PRN

## 2023-02-08 MED ORDER — HYDROCORTISONE 2.5 % EX OINT: TOPICAL_OINTMENT | Freq: Two times a day (BID) | CUTANEOUS | 1 refills | Status: AC

## 2023-02-08 NOTE — Patient Instructions (Addendum)
1. Moderate persistent asthma, uncomplicated - Lung testing not done today since you have this recent diagnosis of Strep throat.  - We will do this next time you come in.  - Daily controller medication(s): Advair 500/56mcg one puff twice daily - Prior to physical activity: albuterol 2 puffs 10-15 minutes before physical activity - Rescue medications: albuterol 4 puffs every 4-6 hours as needed - Asthma control goals:  * Full participation in all desired activities (may need albuterol before activity) * Albuterol use two time or less a week on average (not counting use with activity) * Cough interfering with sleep two time or less a month * Oral steroids no more than once a year * No hospitalizations  2. LPRD (laryngopharyngeal reflux disease) - Continue with famotidine in the morning. - Continue with pantoprazole at night.   3. Allergic rhinitis - Continue with Flonase one spray per nostril daily EVERY DAY.  - Continue with Astelin one spray per nostril twice daily EVERY DAY. - Continue with the Claritin for your itching.   4. Pruritis (itching) - Continue with Claritin once daily.  - Continue with Atarax 25mg  twice daily as needed (can cause sleepiness).  - Moisturize moisturize moisturize!  - Continue with hydrocortisone 2.5% ointment twice daily to keep inflammation on the skin under control.   5. Return in about 6 months (around 08/08/2023).    Please inform us of any Emergency Department visits, hospitalizations, or changes in symptoms. Call us before going to the ED for breathing or allergy symptoms since we might be able to fit you in for a sick visit. Feel free to contact us anytime with any questions, problems, or concerns.  It was a pleasure to see you again today!   Websites that have reliable patient information: 1. American Academy of Asthma, Allergy, and Immunology: www.aaaai.org 2. Food Allergy Research and Education (FARE): foodallergy.org 3. Mothers of Asthmatics:  http://www.asthmacommunitynetwork.org 4. American College of Allergy, Asthma, and Immunology: www.acaai.org   COVID-19 Vaccine Information can be found at: [email or web address].[email or web address] For questions related to vaccine distribution or appointments, please email [email or web address] or call [phone].     "Like" [place] on Facebook and Instagram for our latest updates!      Make sure you are registered to vote! If you have moved or changed any of your contact information, you will need to get this updated before voting!  In some cases, you MAY be able to register to vote online: [email or web address]

## 2023-02-08 NOTE — Progress Notes (Signed)
FOLLOW UP  Date of Service/Encounter:  02/08/23   Assessment:   Moderate persistent asthma, uncomplicated   LPRD (laryngopharyngeal reflux disease)   Allergic rhinitis   Pruritus - improved with Claritin and Atarax   Plan/Recommendations:   1. Moderate persistent asthma, uncomplicated - Lung testing not done today since you have this recent diagnosis of Strep throat.  - We will do this next time you come in.  - Daily controller medication(s): Advair 500/23mcg one puff twice daily - Prior to physical activity: albuterol 2 puffs 10-15 minutes before physical activity - Rescue medications: albuterol 4 puffs every 4-6 hours as needed - Asthma control goals:  * Full participation in all desired activities (may need albuterol before activity) * Albuterol use two time or less a week on average (not counting use with activity) * Cough interfering with sleep two time or less a month * Oral steroids no more than once a year * No hospitalizations  2. LPRD (laryngopharyngeal reflux disease) - Continue with famotidine in the morning. - Continue with pantoprazole at night.   3. Allergic rhinitis - Continue with Flonase one spray per nostril daily EVERY DAY.  - Continue with Astelin one spray per nostril twice daily EVERY DAY. - Continue with the Claritin for your itching.   4. Pruritis (itching) - Continue with Claritin once daily.  - Continue with Atarax 25mg  twice daily as needed (can cause sleepiness).  - Moisturize moisturize moisturize!  - Continue with hydrocortisone 2.5% ointment twice daily to keep inflammation on the skin under control.   5. Return in about 6 months (around 08/08/2023).   Subjective:   Brooke Schneider is a 72 y.o. female presenting today for follow up of  Chief Complaint  Patient presents with   Follow-up    C/o red eyes   Pruritus    C/o back itching states if she takes an antihistamine the itching stops.    Brooke Schneider has a history of  the following: Patient Active Problem List   Diagnosis Date Noted   Prediabetes 01/03/2023   GERD (gastroesophageal reflux disease) 07/17/2019   OA (osteoarthritis) of knee s/p TKA 2021 04/13/2019   MDD (major depressive disorder) 09/13/2016   Tinea versicolor 07/06/2016   IBS (irritable bowel syndrome) 01/02/2016   Hypothyroidism 10/17/2015   Migraine 11/27/2012   Insomnia 07/21/2009   Allergies 01/19/2008   Asthma, allergic 01/19/2008    History obtained from: chart review and patient.  Brooke Schneider is a 72 y.o. female presenting for a follow up visit.  She was last seen in January 2024.  At that time, lung testing was not done.  We continue with Advair 500 mcg 1 puff twice daily as well as albuterol as needed.  For her reflux, she was continued on pantoprazole as well as famotidine.  Allergic rhinitis was controlled with Flonase as well as Astelin.  For itching, she was continued on Claritin as well as Atarax twice daily as needed.  We added on hydrocortisone 2.5% ointment twice daily.  We referred her to see dermatology.  Asthma/Respiratory Symptom History: Asthma is under good control with the current regimen. She is on the Advair one puff BID. She does the Advair and the thing that she is having a problem with is using it twice daily as recommended.   Allergic Rhinitis Symptom History: She has Strep throat right now and is on amoxicillin. She did have her Tetanus shot in April and then around the same time she had Shingrix.  She then had her booster for her single shot. She was rolling through those vaccines and in between that time she felt bad and was diagnosed with Strep throat. Her son got married as well.   She does fine with Benadryl or Claritin. As long as she remains on this, she does well.   Skin Symptom History: She thinks that she has enough of the Atarax. She has a large jar tof the hydrocortisone to use on her back.  She has a device that she is going get from [place] to help with  this.   Otherwise, there have been no changes to her past medical history, surgical history, family history, or social history.    Review of systems otherwise negative other than that mentioned in the HPI.    Objective:   Blood pressure 118/80, pulse 77, temperature 98.1 F (36.7 C), height 5\' 5"  (1.651 m), weight 206 lb 4.8 oz (93.6 kg), SpO2 96%. Body mass index is 34.33 kg/m.    Physical Exam Vitals reviewed.  Constitutional:      Appearance: Normal appearance. She is well-developed.     Comments: Very lovely and cooperative with the exam. Talkative.   HENT:     Head: Normocephalic and atraumatic.     Right Ear: Tympanic membrane, ear canal and external ear normal. No drainage, swelling or tenderness. Tympanic membrane is not injected, scarred, erythematous, retracted or bulging.     Left Ear: Tympanic membrane, ear canal and external ear normal. No drainage, swelling or tenderness. Tympanic membrane is not injected, scarred, erythematous, retracted or bulging.     Nose: No nasal deformity, septal deviation, mucosal edema or rhinorrhea.     Right Turbinates: Enlarged, swollen and pale.     Left Turbinates: Enlarged, swollen and pale.     Right Sinus: No maxillary sinus tenderness or frontal sinus tenderness.     Left Sinus: No maxillary sinus tenderness or frontal sinus tenderness.     Comments: No nasal polyps noted.     Mouth/Throat:     Lips: Pink.     Mouth: Mucous membranes are moist. Mucous membranes are not pale and not dry.     Pharynx: Uvula midline.     Comments: Mild cobblestoning in the posterior oropharynx.  Eyes:     General: Lids are normal. Allergic shiner present.        Right eye: No discharge.        Left eye: No discharge.     Conjunctiva/sclera: Conjunctivae normal.     Right eye: Right conjunctiva is not injected. No chemosis.    Left eye: Left conjunctiva is not injected. No chemosis.    Pupils: Pupils are equal, round, and reactive to light.   Cardiovascular:     Rate and Rhythm: Normal rate and regular rhythm.     Heart sounds: Normal heart sounds.  Pulmonary:     Effort: Pulmonary effort is normal. No tachypnea, accessory muscle usage or respiratory distress.     Breath sounds: Normal breath sounds. No wheezing, rhonchi or rales.     Comments: Moving air well in all lung fields. No increased work of breathing noted.  Chest:     Chest wall: No tenderness.  Abdominal:     Tenderness: There is no abdominal tenderness. There is no guarding or rebound.  Lymphadenopathy:     Head:     Right side of head: No submandibular, tonsillar or occipital adenopathy.     Left side of head: No  submandibular, tonsillar or occipital adenopathy.     Cervical: No cervical adenopathy.  Skin:    General: Skin is warm.     Capillary Refill: Capillary refill takes less than 2 seconds.     Coloration: Skin is not pale.     Findings: No abrasion, erythema, petechiae or rash. Rash is not papular, urticarial or vesicular.     Comments: She does have some lesions on her bilateral arms that are healed over. There are some excoriations on the upper back.   Neurological:     Mental Status: She is alert.  Psychiatric:        Behavior: Behavior is cooperative.      Diagnostic studies: none       Malachi Bonds, MD  [facility]

## 2023-02-09 ENCOUNTER — Encounter: Payer: Self-pay | Admitting: Allergy & Immunology

## 2023-02-24 ENCOUNTER — Telehealth: Payer: Self-pay | Admitting: Family Medicine

## 2023-02-24 ENCOUNTER — Other Ambulatory Visit: Payer: Self-pay | Admitting: *Deleted

## 2023-02-24 MED ORDER — BUPROPION HCL ER (XL) 300 MG PO TB24: 300.0000 mg | ORAL_TABLET | Freq: Every day | ORAL | 3 refills | Status: DC

## 2023-02-24 MED ORDER — LEVOTHYROXINE SODIUM 125 MCG PO TABS: 125.0000 ug | ORAL_TABLET | Freq: Every day | ORAL | 3 refills | Status: DC

## 2023-02-24 NOTE — Telephone Encounter (Signed)
Prescription Request  02/24/2023  LOV: 02/04/2023  What is the name of the medication or equipment?  levothyroxine (SYNTHROID) 125 MCG tablet   buPROPion (WELLBUTRIN XL) 300 MG 24 hr tablet    Have you contacted your pharmacy to request a refill? No   Which pharmacy would you like this sent to? [id number] [place], [place] - [address] AT [facility] OF [address] & [facility] [address] [place] [address] Phone: [phone] Fax: [phone]   Patient notified that their request is being sent to the clinical staff for review and that they should receive a response within 2 business days.   Please advise at Mobile [phone] (mobile)

## 2023-02-24 NOTE — Telephone Encounter (Signed)
Rx send to pharmacy

## 2023-03-03 ENCOUNTER — Ambulatory Visit: Payer: Medicare Other | Admitting: Family Medicine

## 2023-03-03 ENCOUNTER — Encounter: Payer: Self-pay | Admitting: Family Medicine

## 2023-03-03 VITALS — BP 125/85 | HR 78 | Temp 97.5°F | Ht 65.0 in | Wt 207.0 lb

## 2023-03-03 DIAGNOSIS — J029 Acute pharyngitis, unspecified: Secondary | ICD-10-CM | POA: Diagnosis not present

## 2023-03-03 DIAGNOSIS — T7840XS Allergy, unspecified, sequela: Secondary | ICD-10-CM

## 2023-03-03 DIAGNOSIS — F331 Major depressive disorder, recurrent, moderate: Secondary | ICD-10-CM | POA: Diagnosis not present

## 2023-03-03 LAB — POCT RAPID STREP A (OFFICE): Rapid Strep A Screen: POSITIVE — AB

## 2023-03-03 LAB — POC COVID19 BINAXNOW: SARS Coronavirus 2 Ag: NEGATIVE

## 2023-03-03 MED ORDER — AMOXICILLIN 500 MG PO CAPS: 500.0000 mg | ORAL_CAPSULE | Freq: Two times a day (BID) | ORAL | 0 refills | Status: AC

## 2023-03-03 NOTE — Assessment & Plan Note (Signed)
Symptoms stable on Wellbutrin 300 mg daily.  She has refills at the pharmacy.  Do not need any refills today.

## 2023-03-03 NOTE — Assessment & Plan Note (Signed)
Following with allergist.  Currently on Flonase, Astelin, and hydroxyzine.  May be contributing to her above scratchy throat however we will be treating for strep as above.

## 2023-03-03 NOTE — Progress Notes (Signed)
   Brooke Schneider is a 72 y.o. female who presents today for an office visit.  Assessment/Plan:  New/Acute Problems: Sore Throat Rapid strep positive. Discussed with patient that this may be due to her recent infection or colonization however due to worsening symptoms the last several days would be reasonable to treat with another course of antibiotics. Will start 500 mg amboxicillin twice daily for 10 days. Encouraged hydration. She can continue OTC medications as needed. We discussed reasons to return to care and seek emergent care.   Chronic Problems Addressed Today: MDD (major depressive disorder) Symptoms stable on Wellbutrin 300 mg daily.  She has refills at the pharmacy.  Do not need any refills today.  Allergies Following with allergist.  Currently on Flonase, Astelin, and hydroxyzine.  May be contributing to her above scratchy throat however we will be treating for strep as above.     Subjective:  HPI:  See Assessment / plan for status of chronic conditions.   Patient here today with persistent sore throat.  We saw her for this several weeks ago.  Rapid strep at that time was positive.  She was started on amoxicillin 500 mg twice daily for 10 days. Symptoms resolved for a few weeks but started coming back a few days ago. She is having some sore throat scratchy throat, neck pain, joint aches, and fatigue. Tried taking OTC medications with some improvement. She is also having more loose stools. No cough. No rhinorrhea. Mild fevers.        Objective:  Physical Exam: BP 125/85   Pulse 78   Temp (!) 97.5 F (36.4 C) (Temporal)   Ht 5\' 5"  (1.651 m)   Wt 207 lb (93.9 kg)   BMI 34.45 kg/m   Gen: No acute distress, resting comfortably HEENT: OP erythematous.  No exudates.  TMs clear. CV: Regular rate and rhythm with no murmurs appreciated Pulm: Normal work of breathing, clear to auscultation bilaterally with no crackles, wheezes, or rhonchi Neuro: Grossly normal, moves all  extremities Psych: Normal affect and thought content      Cammie Faulstich M. Jimmey Ralph, MD 03/03/2023 9:47 AM

## 2023-03-03 NOTE — Patient Instructions (Signed)
It was very nice to see you today!  We will restart your amoxicillin.  Let us know if not improving.  Return if symptoms worsen or fail to improve.   Take care, Dr Jimmey Ralph  PLEASE NOTE:  If you had any lab tests, please let us know if you have not heard back within a few days. You may see your results on mychart before we have a chance to review them but we will give you a call once they are reviewed by [place].   If we ordered any referrals today, please let us know if you have not heard from their office within the next week.   If you had any urgent prescriptions sent in today, please check with the pharmacy within an hour of our visit to make sure the prescription was transmitted appropriately.   Please try these tips to maintain a healthy lifestyle:  Eat at least 3 REAL meals and 1-2 snacks per day.  Aim for no more than 5 hours between eating.  If you eat breakfast, please do so within one hour of getting up.   Each meal should contain half fruits/vegetables, one quarter protein, and one quarter carbs (no bigger than a computer mouse)  Cut down on sweet beverages. This includes juice, soda, and sweet tea.   Drink at least 1 glass of water with each meal and aim for at least 8 glasses per day  Exercise at least 150 minutes every week.

## 2023-03-07 DIAGNOSIS — N398 Other specified disorders of urinary system: Secondary | ICD-10-CM | POA: Diagnosis not present

## 2023-03-07 DIAGNOSIS — B379 Candidiasis, unspecified: Secondary | ICD-10-CM | POA: Diagnosis not present

## 2023-03-31 ENCOUNTER — Telehealth: Payer: Self-pay | Admitting: Family Medicine

## 2023-03-31 NOTE — Telephone Encounter (Signed)
Prescription Request  03/31/2023  LOV: 03/03/2023  What is the name of the medication or equipment? zolpidem (AMBIEN) 10 MG tablet    Have you contacted your pharmacy to request a refill? Yes   Which pharmacy would you like this sent to?   [facility] #[id number] [place], [place] - [address] AT [facility] OF [address] & [facility] [address] [place] [address] Phone: [phone] Fax: [phone]    Patient notified that their request is being sent to the clinical staff for review and that they should receive a response within 2 business days.   Please advise at Mobile [phone] (mobile)

## 2023-04-01 MED ORDER — ZOLPIDEM TARTRATE 10 MG PO TABS: 10.0000 mg | ORAL_TABLET | Freq: Every day | ORAL | 1 refills | Status: DC

## 2023-04-04 NOTE — Telephone Encounter (Signed)
Prescription was sent to [facility] pt would like it resent to   [facility] #[id number] [place], [place] - [address] AT [facility] OF [address] & [facility] [address] [place] [address] Phone: [phone] Fax: [phone].   Please advise

## 2023-04-05 NOTE — Addendum Note (Signed)
Addended by: Dyann Kief on: 04/05/2023 10:47 AM   Modules accepted: Orders

## 2023-04-06 ENCOUNTER — Telehealth: Payer: Self-pay | Admitting: Family Medicine

## 2023-04-06 NOTE — Telephone Encounter (Signed)
Prescription Request 04/06/2023  LOV: 03/03/2023  What is the name of the medication or equipment? rizatriptan (MAXALT) 10 MG tablet   Have you contacted your pharmacy to request a refill? Yes   Which pharmacy would you like this sent to?   [facility] #[id number] - [place], [place] - [address] AT [facility] OF [address] & [facility] [address] Phone: [phone]  Fax: [phone]      Patient notified that their request is being sent to the clinical staff for review and that they should receive a response within 2 business days.   Please advise at Mobile [phone] (mobile)

## 2023-04-07 ENCOUNTER — Other Ambulatory Visit: Payer: Self-pay | Admitting: *Deleted

## 2023-04-07 MED ORDER — RIZATRIPTAN BENZOATE 10 MG PO TABS: 10.0000 mg | ORAL_TABLET | ORAL | 2 refills | Status: DC | PRN

## 2023-04-07 NOTE — Telephone Encounter (Signed)
Ok with me. Please place any necessary orders. 

## 2023-04-07 NOTE — Telephone Encounter (Signed)
Rx send to pharmacy  

## 2023-04-12 ENCOUNTER — Ambulatory Visit: Payer: Medicare Other

## 2023-04-13 ENCOUNTER — Ambulatory Visit (INDEPENDENT_AMBULATORY_CARE_PROVIDER_SITE_OTHER): Payer: Medicare Other | Admitting: Physician Assistant

## 2023-04-13 ENCOUNTER — Other Ambulatory Visit (HOSPITAL_COMMUNITY)
Admission: RE | Admit: 2023-04-13 | Discharge: 2023-04-13 | Disposition: A | Discharge status: Home / Self Care | Payer: Medicare Other | Source: Ambulatory Visit | Attending: Physician Assistant | Admitting: Physician Assistant

## 2023-04-13 ENCOUNTER — Encounter: Payer: Self-pay | Admitting: Physician Assistant

## 2023-04-13 VITALS — BP 128/81 | HR 86 | Temp 97.8°F | Ht 65.0 in | Wt 204.2 lb

## 2023-04-13 DIAGNOSIS — J3089 Other allergic rhinitis: Secondary | ICD-10-CM | POA: Diagnosis not present

## 2023-04-13 DIAGNOSIS — J4540 Moderate persistent asthma, uncomplicated: Secondary | ICD-10-CM | POA: Diagnosis not present

## 2023-04-13 DIAGNOSIS — N760 Acute vaginitis: Secondary | ICD-10-CM | POA: Diagnosis not present

## 2023-04-13 DIAGNOSIS — T7840XS Allergy, unspecified, sequela: Secondary | ICD-10-CM

## 2023-04-13 MED ORDER — PREDNISONE 20 MG PO TABS: 20.0000 mg | ORAL_TABLET | Freq: Two times a day (BID) | ORAL | 0 refills | Status: DC

## 2023-04-13 NOTE — Patient Instructions (Signed)
It was great to see you!  Restart Loratadine 10 mg // Claritin 10 mg   Start oral prednisone 20 mg twice daily to help reduce your albuterol use  We will be in touch with vaginal swab results  Take care,  Jarold Motto PA-C

## 2023-04-13 NOTE — Progress Notes (Signed)
Brooke Schneider is a 72 y.o. female here for a new problem.  History of Present Illness:   Chief Complaint  Patient presents with   Sinus Problem    Pt still c/o sinus issues, scratchy throat, headache and fatigue    HPI  Allergies Seen here on 03/03/23 for sore throat, diagnosed with strep and treated with Amoxicillin. Completed course and felt symptom(s) overall improve however, symptom(s) returned about a month ago. Has been having worsening sinus pressure recently. Using Flonase, Astelin, hydroxyzine. Using Advair She follows this protocol per Dr Dellis Anes, her allergist. Previously took Claritin and would like to restart. Has not taken this for about a week due to running out -- has since noticed that she has to use albuterol daily.  Vaginal discharge She has a yellow/clear vaginal discharge. Treated for yeast infection on 03/07/23 with Diflucan by Dan Humphreys PA at [facility]. Based on chart review - no wetprep or other sample collected for this She feels like this never resolved. Sees a GYN physician and has upcoming appointment. Denies urinary symptom(s) Has intermittent bilateral flank discomfort that doesn't seem to have any obvious aggravating/precipitating factors  Past Medical History:  Diagnosis Date   Allergic rhinitis    Arthritis    Asthma    Depression    Goiter    Goiter    Headache(784.0)    Sleep apnea      Social History   Tobacco Use   Smoking status: Never    Passive exposure: Yes   Smokeless tobacco: Never   Tobacco comments:    husband smoking cigars  Vaping Use   Vaping status: Never Used  Substance Use Topics   Alcohol use: Not Currently    Comment: occ   Drug use: Never    Past Surgical History:  Procedure Laterality Date   ABDOMINAL HYSTERECTOMY     CHOLECYSTECTOMY N/A 11/02/2013   Procedure: LAPAROSCOPIC CHOLECYSTECTOMY ;  Surgeon: Cherylynn Ridges, MD;  Location: [facility] OR;  Service: General;  Laterality: N/A;   KNEE  SURGERY     THYROIDECTOMY      Family History  Problem Relation Age of Onset   Allergies Mother    Dementia Mother    ALS Father    Prostate cancer Father    Colon cancer Neg Hx    Esophageal cancer Neg Hx    Stomach cancer Neg Hx    Rectal cancer Neg Hx     Allergies  Allergen Reactions   Sulfa Antibiotics    Sulfamethoxazole-Trimethoprim Swelling   Metronidazole Itching and Rash    Current Medications:   Current Outpatient Medications:    albuterol (VENTOLIN HFA) 108 (90 Base) MCG/ACT inhaler, Inhale two puffs every 4-6 hours if needed for cough or wheeze, Disp: 18 g, Rfl: 2   azelastine (ASTELIN) 0.1 % nasal spray, Place 1 spray into both nostrils 2 (two) times daily as needed for rhinitis. Use in each nostril as directed, Disp: 90 mL, Rfl: 1   bismuth subsalicylate (PEPTO BISMOL) 262 MG/15ML suspension, Take 30 mLs by mouth every 6 (six) hours as needed., Disp: , Rfl:    buPROPion (WELLBUTRIN XL) 300 MG 24 hr tablet, Take 1 tablet (300 mg total) by mouth daily., Disp: 90 tablet, Rfl: 3   Calcium Carbonate-Vitamin D 600-400 MG-UNIT tablet, Take 1 tablet by mouth 2 (two) times daily., Disp: , Rfl:    clotrimazole (LOTRIMIN) 1 % cream, Apply topically., Disp: , Rfl:    desonide (DESOWEN) 0.05 %  cream, APPLY TO AFFECTED AREA TWICE A DAY., Disp: 30 g, Rfl: 0   dicyclomine (BENTYL) 20 MG tablet, TAKE 1 TABLET FOUR TIMES A DAY BEFORE MEALS AND AT BEDTIME, Disp: 90 tablet, Rfl: 2   fluticasone (FLONASE) 50 MCG/ACT nasal spray, One spray each nostril daily as needed, Disp: 48 g, Rfl: 1   fluticasone-salmeterol (ADVAIR) 500-50 MCG/ACT AEPB, Inhale 1 puff into the lungs in the morning and at bedtime., Disp: 60 each, Rfl: 5   hydrocortisone 2.5 % ointment, Apply topically 2 (two) times daily., Disp: 454 g, Rfl: 1   hydrOXYzine (ATARAX) 25 MG tablet, Take 1 tablet (25 mg total) by mouth 2 (two) times daily as needed., Disp: 180 tablet, Rfl: 1   ketoconazole (NIZORAL) 2 % shampoo, Apply 1  application. topically 2 (two) times a week. To affected areas on skin, Disp: 120 mL, Rfl: 2   levothyroxine (SYNTHROID) 125 MCG tablet, Take 1 tablet (125 mcg total) by mouth daily., Disp: 90 tablet, Rfl: 3   Multiple Vitamins-Minerals (MULTIVITAMIN WITH MINERALS) tablet, Take 1 tablet by mouth daily., Disp: , Rfl:    pantoprazole (PROTONIX) 40 MG tablet, Take 1 tablet (40 mg total) by mouth 2 (two) times daily., Disp: 180 tablet, Rfl: 3   rizatriptan (MAXALT) 10 MG tablet, Take 1 tablet (10 mg total) by mouth as needed for migraine. May repeat in 2 hours if needed, Disp: 18 tablet, Rfl: 2   topiramate (TOPAMAX) 100 MG tablet, Take 1 tablet (100 mg total) by mouth 2 (two) times daily., Disp: 180 tablet, Rfl: 3   zolpidem (AMBIEN) 10 MG tablet, Take 1 tablet (10 mg total) by mouth at bedtime. (Patient not taking: Reported on 04/13/2023), Disp: 90 tablet, Rfl: 1   Review of Systems:   Review of Systems  Constitutional:  Negative for fever and malaise/fatigue.  HENT:  Positive for sinus pain and sore throat. Negative for congestion.   Eyes:  Negative for blurred vision.  Respiratory:  Negative for cough and shortness of breath.   Cardiovascular:  Negative for chest pain, palpitations and leg swelling.  Gastrointestinal:  Negative for vomiting.  Genitourinary:  Negative for dysuria, frequency, hematuria and urgency.  Musculoskeletal:  Positive for back pain (Lower).  Skin:  Negative for rash.  Neurological:  Positive for headaches. Negative for loss of consciousness.    Vitals:   Vitals:   04/13/23 1521  BP: 128/81  Pulse: 86  Temp: 97.8 F (36.6 C)  TempSrc: Temporal  SpO2: 99%  Weight: 204 lb 4 oz (92.6 kg)  Height: 5\' 5"  (1.651 m)     Body mass index is 33.99 kg/m.  Physical Exam:   Physical Exam Vitals and nursing note reviewed.  Constitutional:      General: She is not in acute distress.    Appearance: She is well-developed. She is not ill-appearing or toxic-appearing.   HENT:     Head: Normocephalic and atraumatic.     Right Ear: Tympanic membrane, ear canal and external ear normal. Tympanic membrane is not erythematous, retracted or bulging.     Left Ear: Tympanic membrane, ear canal and external ear normal. Tympanic membrane is not erythematous, retracted or bulging.     Nose: Nose normal.     Right Sinus: No maxillary sinus tenderness or frontal sinus tenderness.     Left Sinus: No maxillary sinus tenderness or frontal sinus tenderness.     Mouth/Throat:     Pharynx: Uvula midline. No posterior oropharyngeal erythema.  Eyes:  General: Lids are normal.     Conjunctiva/sclera: Conjunctivae normal.  Neck:     Trachea: Trachea normal.  Cardiovascular:     Rate and Rhythm: Normal rate and regular rhythm.     Heart sounds: Normal heart sounds, S1 normal and S2 normal.  Pulmonary:     Effort: Pulmonary effort is normal.     Breath sounds: Normal breath sounds. No decreased breath sounds, wheezing, rhonchi or rales.  Lymphadenopathy:     Cervical: No cervical adenopathy.  Skin:    General: Skin is warm and dry.  Neurological:     Mental Status: She is alert.  Psychiatric:        Speech: Speech normal.        Behavior: Behavior normal. Behavior is cooperative.    Patient obtained self-swab  Assessment and Plan:   Allergy, sequela; Moderate persistent extrinsic asthma without complication Uncontrolled Recommend resuming claritin and continuing with the rest of her allergy regimen Will also add prednisone 40 mg daily x 5 day to help reduce use of albuterol If symptom(s) persist, recommend close follow-up with allergist  Acute vaginitis Patient obtained self-swab  Will add treatment based on results Follow-up based on results  I,Alexander Ruley,acting as a scribe for Jarold Motto, PA.,have documented all relevant documentation on the behalf of Jarold Motto, PA,as directed by  Jarold Motto, PA while in the presence of Jarold Motto, [place].  I, Jarold Motto, [place], have reviewed all documentation for this visit. The documentation on 04/13/23 for the exam, diagnosis, procedures, and orders are all accurate and complete.   Jarold Motto, PA-C

## 2023-04-15 ENCOUNTER — Ambulatory Visit (INDEPENDENT_AMBULATORY_CARE_PROVIDER_SITE_OTHER): Payer: Medicare Other | Admitting: Family Medicine

## 2023-04-15 ENCOUNTER — Other Ambulatory Visit: Payer: Self-pay | Admitting: Physician Assistant

## 2023-04-15 VITALS — BP 114/77 | HR 78 | Temp 97.6°F | Wt 203.8 lb

## 2023-04-15 DIAGNOSIS — E039 Hypothyroidism, unspecified: Secondary | ICD-10-CM

## 2023-04-15 DIAGNOSIS — T7840XS Allergy, unspecified, sequela: Secondary | ICD-10-CM

## 2023-04-15 DIAGNOSIS — F5101 Primary insomnia: Secondary | ICD-10-CM

## 2023-04-15 DIAGNOSIS — J3089 Other allergic rhinitis: Secondary | ICD-10-CM | POA: Diagnosis not present

## 2023-04-15 DIAGNOSIS — K219 Gastro-esophageal reflux disease without esophagitis: Secondary | ICD-10-CM | POA: Diagnosis not present

## 2023-04-15 DIAGNOSIS — G43909 Migraine, unspecified, not intractable, without status migrainosus: Secondary | ICD-10-CM

## 2023-04-15 LAB — CERVICOVAGINAL ANCILLARY ONLY
Bacterial Vaginitis (gardnerella): POSITIVE — AB
Candida Glabrata: NEGATIVE
Candida Vaginitis: NEGATIVE
Chlamydia: NEGATIVE
Comment: NEGATIVE
Comment: NEGATIVE
Comment: NEGATIVE
Comment: NEGATIVE
Comment: NEGATIVE
Comment: NORMAL
Neisseria Gonorrhea: NEGATIVE
Trichomonas: NEGATIVE

## 2023-04-15 MED ORDER — TOPIRAMATE 100 MG PO TABS: 100.0000 mg | ORAL_TABLET | Freq: Two times a day (BID) | ORAL | 3 refills | Status: DC

## 2023-04-15 MED ORDER — CLINDAMYCIN HCL 300 MG PO CAPS: 300.0000 mg | ORAL_CAPSULE | Freq: Two times a day (BID) | ORAL | 0 refills | Status: DC

## 2023-04-15 MED ORDER — PANTOPRAZOLE SODIUM 40 MG PO TBEC: 40.0000 mg | DELAYED_RELEASE_TABLET | Freq: Two times a day (BID) | ORAL | 3 refills | Status: DC

## 2023-04-15 MED ORDER — ZOLPIDEM TARTRATE 10 MG PO TABS: 10.0000 mg | ORAL_TABLET | Freq: Every day | ORAL | 1 refills | Status: DC

## 2023-04-15 NOTE — Assessment & Plan Note (Signed)
Last TSH at goal.  Continue Synthroid 125 mcg daily.

## 2023-04-15 NOTE — Assessment & Plan Note (Signed)
Overall stable.  Continue Protonix 40 mg twice daily.

## 2023-04-15 NOTE — Assessment & Plan Note (Signed)
Stable on Ambien nightly as needed.  Will refill today.

## 2023-04-15 NOTE — Assessment & Plan Note (Signed)
Following with allergist.  She did have a flareup recently but now symptoms are better controlled.  Does not need refills today.

## 2023-04-15 NOTE — Progress Notes (Signed)
   Brooke Schneider is a 72 y.o. female who presents today for an office visit.  Assessment/Plan:  Chronic Problems Addressed Today: Migraine She does have a mild flareup recently after being off meds but this is improving.  Will refill her Topamax 100 mg twice daily.  She has Maxalt to use as needed as well.  GERD (gastroesophageal reflux disease) Overall stable.  Continue Protonix 40 mg twice daily.  Hypothyroidism Last TSH at goal.  Continue Synthroid 125 mcg daily.  Allergies Following with allergist.  She did have a flareup recently but now symptoms are better controlled.  Does not need refills today.  Insomnia Stable on Ambien nightly as needed.  Will refill today.    Subjective:  HPI:  See Assessment / plan for status of chronic conditions. She is here today for follow up.  We last saw her a few weeks ago. Since our last visit she changed pharmacies and was out of her medications for several days. This did cause a flare up of her migraines and body aches. She has since been able to get back on her medications and her symptoms are improving.        Objective:  Physical Exam: BP 114/77 (BP Location: Right Arm)   Pulse 78   Temp 97.6 F (36.4 C) (Temporal)   Wt 203 lb 12.8 oz (92.4 kg)   SpO2 97%   BMI 33.91 kg/m   Gen: No acute distress, resting comfortably CV: Regular rate and rhythm with no murmurs appreciated Pulm: Normal work of breathing, clear to auscultation bilaterally with no crackles, wheezes, or rhonchi Neuro: Grossly normal, moves all extremities Psych: Normal affect and thought content      Forest Pruden M. Jimmey Ralph, MD 04/15/2023 1:45 PM

## 2023-04-15 NOTE — Progress Notes (Signed)
Ok to send in clindamycin 300 mg twice daily for 7 days.  Katina Degree. Jimmey Ralph, MD 04/15/2023 2:38 PM

## 2023-04-15 NOTE — Assessment & Plan Note (Signed)
She does have a mild flareup recently after being off meds but this is improving.  Will refill her Topamax 100 mg twice daily.  She has Maxalt to use as needed as well.

## 2023-04-15 NOTE — Patient Instructions (Addendum)
It was very nice to see you today!  We will refill your meds.  I am glad you are feeling better.  Will see you back in 6 months.  Come back to see [place] sooner if needed.  Return in about 6 months (around 10/13/2023) for Follow Up.   Take care, Dr Jimmey Ralph  PLEASE NOTE:  If you had any lab tests, please let us know if you have not heard back within a few days. You may see your results on mychart before we have a chance to review them but we will give you a call once they are reviewed by [place].   If we ordered any referrals today, please let us know if you have not heard from their office within the next week.   If you had any urgent prescriptions sent in today, please check with the pharmacy within an hour of our visit to make sure the prescription was transmitted appropriately.   Please try these tips to maintain a healthy lifestyle:  Eat at least 3 REAL meals and 1-2 snacks per day.  Aim for no more than 5 hours between eating.  If you eat breakfast, please do so within one hour of getting up.   Each meal should contain half fruits/vegetables, one quarter protein, and one quarter carbs (no bigger than a computer mouse)  Cut down on sweet beverages. This includes juice, soda, and sweet tea.   Drink at least 1 glass of water with each meal and aim for at least 8 glasses per day  Exercise at least 150 minutes every week.

## 2023-05-11 ENCOUNTER — Ambulatory Visit: Payer: Medicare Other

## 2023-05-17 ENCOUNTER — Ambulatory Visit: Payer: Medicare Other

## 2023-07-11 ENCOUNTER — Other Ambulatory Visit: Payer: Self-pay

## 2023-07-11 MED ORDER — FLUTICASONE-SALMETEROL 500-50 MCG/ACT IN AEPB: 1.0000 | INHALATION_SPRAY | Freq: Two times a day (BID) | RESPIRATORY_TRACT | 5 refills | Status: DC

## 2023-07-19 DIAGNOSIS — N390 Urinary tract infection, site not specified: Secondary | ICD-10-CM | POA: Diagnosis not present

## 2023-07-19 DIAGNOSIS — N760 Acute vaginitis: Secondary | ICD-10-CM | POA: Diagnosis not present

## 2023-08-10 ENCOUNTER — Other Ambulatory Visit: Payer: Self-pay | Admitting: Allergy & Immunology

## 2023-08-11 ENCOUNTER — Ambulatory Visit: Payer: Self-pay | Admitting: Family Medicine

## 2023-08-11 NOTE — Telephone Encounter (Signed)
 noted

## 2023-08-11 NOTE — Telephone Encounter (Signed)
 Chief Complaint: Eye redness Symptoms: Sneezing, cough, sore throat, slight nasal drainage Frequency: 2 weeks Pertinent Negatives: Patient denies pain Disposition: [] ED /[] Urgent Care (no appt availability in office) / [] Appointment(In office/virtual)/ []  [facility]/ [x] Home Care/ [] Refused Recommended Disposition /[] [facility]/ []  Follow-up with PCP Additional Notes: Patient called in to report redness in both of her eyes. Patient originally called in earlier today, but stated the redness has been improving since she called. Patient denied pain and discharge from eyes. Patient denied fever. Patient is experiencing sneezing, cough, sore throat and slight nasal discharge. Patient explained that she has allergies and normally gets these symptoms when the seasons change. This RN advised home care at this time and provided selected care advise. This RN advised patient to call back if symptoms worsen. Patient complied.   Reason for Disposition  [1] Red eye is part of a cold AND [2] no eye pain or blurred vision  Answer Assessment - Initial Assessment Questions 1. LOCATION: Location: "What's red, the eyeball or the outer eyelids?" (Note: when callers say the eye is red, they usually mean the sclera is red)       Eyeball 2. REDNESS OF SCLERA: "Is the redness in one or both eyes?" "When did the redness start?"      Both eyes 3. ONSET: "When did the eye become red?" (e.g., hours, days)      Last two weeks, states initial redness is gone, both eyes appear lighter red at this time 4. EYELIDS: "Are the eyelids red or swollen?" If Yes, ask: "How much?"      Denies 5. VISION: "Is there any difficulty seeing clearly?"      Denies vision changes 6. ITCHING: "Does it feel itchy?" If so ask: "How bad is it" (e.g., Scale 1-10; or mild, moderate, severe)     Denies 7. PAIN: "Is there any pain? If Yes, ask: "How bad is it?" (e.g., Scale 1-10; or mild, moderate, severe)     Denies, states  eyes feel gritty 8. CONTACT LENS: "Do you wear contacts?"     Denies 9. CAUSE: "What do you think is causing the redness?"     Season changing/allergies 10. OTHER SYMPTOMS: "Do you have any other symptoms?" (e.g., fever, runny nose, cough, vomiting)      Sore throat, sneezing, cough, slight drainage from nose  Protocols used: Eye - Red Without Pus-A-AH

## 2023-08-11 NOTE — Telephone Encounter (Signed)
 Copied from CRM [id number]. Topic: Clinical - Medical Advice >> Aug 11, 2023 11:24 AM [name] wrote: Reason for CRM: Patient called in stating she has red eyes and they look sickly stated she has a little cold, and dont know what she should do. Stated she is a thyroid patient . Would like for someone to give her a callback about this matter  Triage Nurse Note 1st attempt to contact pt, no ringing, no answer, LVM for call back to office. Placing in call back.

## 2023-08-11 NOTE — Telephone Encounter (Signed)
 2nd attempt to contact pt, no ringing, LVM for call back to further assess. Placed in call back.

## 2023-08-15 ENCOUNTER — Telehealth: Payer: Self-pay | Admitting: Family Medicine

## 2023-08-15 NOTE — Telephone Encounter (Signed)
 Patient presented in office and informed me that she wanted to let the triage nurse she talked to on 08/11/23, that her eye redness is now starting to affect her left eye. I was able to schedule pt an OV with PCP on 08/16/23 @ 11:40 am.

## 2023-08-15 NOTE — Telephone Encounter (Signed)
 Noted.

## 2023-08-16 ENCOUNTER — Encounter: Payer: Self-pay | Admitting: Family Medicine

## 2023-08-16 ENCOUNTER — Ambulatory Visit (INDEPENDENT_AMBULATORY_CARE_PROVIDER_SITE_OTHER): Admitting: Family Medicine

## 2023-08-16 VITALS — BP 115/73 | HR 76 | Temp 97.2°F | Ht 65.0 in | Wt 208.6 lb

## 2023-08-16 DIAGNOSIS — K219 Gastro-esophageal reflux disease without esophagitis: Secondary | ICD-10-CM | POA: Diagnosis not present

## 2023-08-16 DIAGNOSIS — F5101 Primary insomnia: Secondary | ICD-10-CM | POA: Diagnosis not present

## 2023-08-16 DIAGNOSIS — T7840XS Allergy, unspecified, sequela: Secondary | ICD-10-CM

## 2023-08-16 DIAGNOSIS — J329 Chronic sinusitis, unspecified: Secondary | ICD-10-CM

## 2023-08-16 DIAGNOSIS — M1711 Unilateral primary osteoarthritis, right knee: Secondary | ICD-10-CM

## 2023-08-16 DIAGNOSIS — T7840XA Allergy, unspecified, initial encounter: Secondary | ICD-10-CM

## 2023-08-16 DIAGNOSIS — H1010 Acute atopic conjunctivitis, unspecified eye: Secondary | ICD-10-CM

## 2023-08-16 MED ORDER — ERYTHROMYCIN 5 MG/GM OP OINT: 1.0000 | TOPICAL_OINTMENT | Freq: Every day | OPHTHALMIC | 0 refills | Status: DC

## 2023-08-16 MED ORDER — AMOXICILLIN-POT CLAVULANATE 875-125 MG PO TABS: 1.0000 | ORAL_TABLET | Freq: Two times a day (BID) | ORAL | 0 refills | Status: DC

## 2023-08-16 NOTE — Assessment & Plan Note (Signed)
 Following with allergist.  Symptoms have flared up recently likely due to pollen outbreak.  Likely contributing to her above conjunctivitis and sinusitis.  She can continue her current regimen per allergist with Advair, albuterol, Astelin, and Flonase.

## 2023-08-16 NOTE — Assessment & Plan Note (Signed)
 She has had more flareup of pain in her right knee in the last few weeks.  We did discuss referral to orthopedics however she declined.  Will refer to PT.  She last saw orthopedics a few years ago.

## 2023-08-16 NOTE — Assessment & Plan Note (Signed)
 On Protonix 40 mg twice daily though still having some issues with reflux.  We did discuss having her follow back up with GI however she would like to hold off on this for now until she can address the above issues.  It is okay for her to try Pepcid in addition to the Protonix though reiterated need for her to follow-up with GI soon.  She voiced understanding.

## 2023-08-16 NOTE — Progress Notes (Signed)
   Brooke Schneider is a 73 y.o. female who presents today for an office visit.  Assessment/Plan:  New/Acute Problems: Conjunctivitis  No red flags. Likely allergic conjunctivitis. Reassuring exam today.  No signs of bacterial conjunctivitis.  Recommend she continue with Pataday and over-the-counter antihistamines.  Will send in pocket prescription for erythromycin ointment with instruction to not use unless symptoms worsen or fail to improve within the next couple of days.  We discussed reasons to return to care and seek emergent care.  Sinusitis  No red flags.  Likely related to allergic rhinitis as below.  She can continue her allergy meds.  Will send in pocket prescription for Augmentin with instruction to not use unless symptoms worsen or fail to improve in the next few days.  Encouraged hydration.  We discussed reasons to return to care.  Chronic Problems Addressed Today: OA (osteoarthritis) of knee s/p TKA 2021 She has had more flareup of pain in her right knee in the last few weeks.  We did discuss referral to orthopedics however she declined.  Will refer to PT.  She last saw orthopedics a few years ago.  Allergies Following with allergist.  Symptoms have flared up recently likely due to pollen outbreak.  Likely contributing to her above conjunctivitis and sinusitis.  She can continue her current regimen per allergist with Advair, albuterol, Astelin, and Flonase.  GERD (gastroesophageal reflux disease) On Protonix 40 mg twice daily though still having some issues with reflux.  We did discuss having her follow back up with GI however she would like to hold off on this for now until she can address the above issues.  It is okay for her to try Pepcid in addition to the Protonix though reiterated need for her to follow-up with GI soon.  She voiced understanding.     Subjective:  HPI:  See A/P for status of chronic conditions.  Patient is here today with right eye redness.  This started  about 3 days ago. She was having some crusting in her eye as well. No vision changes. Some itchiness. No burning. No pain. She did start pataday today but has not noticed much improvement.  She feels a gritty sensation in her eye.  No pain.  No fevers or chills.  She is also having more sinus congestion and ear pain.  This has been going on for a few days as well.  No treatments tried for this.  She is worried about developing a potential sinus infection.       Objective:  Physical Exam: BP 115/73   Pulse 76   Temp (!) 97.2 F (36.2 C)   Ht 5\' 5"  (1.651 m)   Wt 208 lb 9.6 oz (94.6 kg)   SpO2 98%   BMI 34.71 kg/m   Gen: No acute distress, resting comfortably HEENT: Bilateral conjunctival erythema noted.  Small amount of watery discharge noted.  No purulent discharge.  Extraocular eye movements intact without pain.  Visual acuity grossly intact.  TMs clear bilaterally. CV: Regular rate and rhythm with no murmurs appreciated Pulm: Normal work of breathing, clear to auscultation bilaterally with no crackles, wheezes, or rhonchi Neuro: Grossly normal, moves all extremities Psych: Normal affect and thought content      Niyana Chesbro M. Jimmey Ralph, MD 08/16/2023 12:38 PM

## 2023-08-16 NOTE — Patient Instructions (Signed)
 It was very nice to see you today!  We will send you to see physical therapy for your knee.  Please try the Pataday for your eyes.  Use the erythromycin if not improving.  Please start the Augmentin if your sinus congestion does not improve in a few days.  Return if symptoms worsen or fail to improve.   Take care, Dr Jimmey Ralph  PLEASE NOTE:  If you had any lab tests, please let us know if you have not heard back within a few days. You may see your results on mychart before we have a chance to review them but we will give you a call once they are reviewed by [place].   If we ordered any referrals today, please let us know if you have not heard from their office within the next week.   If you had any urgent prescriptions sent in today, please check with the pharmacy within an hour of our visit to make sure the prescription was transmitted appropriately.   Please try these tips to maintain a healthy lifestyle:  Eat at least 3 REAL meals and 1-2 snacks per day.  Aim for no more than 5 hours between eating.  If you eat breakfast, please do so within one hour of getting up.   Each meal should contain half fruits/vegetables, one quarter protein, and one quarter carbs (no bigger than a computer mouse)  Cut down on sweet beverages. This includes juice, soda, and sweet tea.   Drink at least 1 glass of water with each meal and aim for at least 8 glasses per day  Exercise at least 150 minutes every week.

## 2023-08-24 NOTE — Telephone Encounter (Signed)
 Please advise  Copied from CRM [id number]. Topic: Clinical - Medical Advice >> Aug 24, 2023 10:42 AM Denese Killings wrote: Reason for CRM: Patient states that she has been taking erythromycin ophthalmic ointment and amoxicillin-clavulanate (AUGMENTIN) 875-125 MG tablet. She has been drinking fluids but she doesn't have an appetite. Patient wants her doctor's opinion.

## 2023-08-25 ENCOUNTER — Ambulatory Visit: Payer: Self-pay

## 2023-08-25 DIAGNOSIS — J40 Bronchitis, not specified as acute or chronic: Secondary | ICD-10-CM | POA: Diagnosis not present

## 2023-08-25 DIAGNOSIS — Z8709 Personal history of other diseases of the respiratory system: Secondary | ICD-10-CM | POA: Diagnosis not present

## 2023-08-25 DIAGNOSIS — R062 Wheezing: Secondary | ICD-10-CM | POA: Diagnosis not present

## 2023-08-25 DIAGNOSIS — R0989 Other specified symptoms and signs involving the circulatory and respiratory systems: Secondary | ICD-10-CM | POA: Diagnosis not present

## 2023-08-25 NOTE — Telephone Encounter (Signed)
 Left message to return call to our office at their convenience.

## 2023-08-25 NOTE — Telephone Encounter (Signed)
 Noted, pt going to UC

## 2023-08-25 NOTE — Telephone Encounter (Signed)
  Chief Complaint: pain with cough Symptoms: chest congestion Frequency: about a week Pertinent Negatives: Patient denies CP, fever,  Disposition: [] ED /[x] Urgent Care (no appt availability in office) / [] Appointment(In office/virtual)/ []  [facility]/ [] Home Care/ [] Refused Recommended Disposition /[] [facility]/ []  Follow-up with PCP Additional Notes: Pt states she has been taking Augmentin for sinus infection as prescribed and azithromycin cream for her eye. Pt states that she is having sore throat  and pain behind her ears. Pt states that she has pain when she coughs. Pt states that she is feeling better overall. Pt saw doctor on 3/11 pt states she is feeling better than initially but is feeling like her chest is more congested. Pt states she is SOB. Pt states that the wheezing started some about 3-4 days ago. Pt advised to go to UC. Pt agreeable.  Copied from CRM [id number]. Topic: Clinical - Red Word Triage >> Aug 25, 2023  2:33 PM [place] E wrote: [facility] that prompted transfer to Nurse Triage: Chest pain when coughing. Patient stated she has been coughing for the past 4-5 days and has been experiencing some pain in her upper chest. Reason for Disposition  [1] MILD difficulty breathing (e.g., minimal/no SOB at rest, SOB with walking, pulse <100) AND [2] NEW-onset or WORSE than normal  Answer Assessment - Initial Assessment Questions 1. RESPIRATORY STATUS: "Describe your breathing?" (e.g., wheezing, shortness of breath, unable to speak, severe coughing)      wheezing 2. ONSET: "When did this breathing problem begin?"      I think the wheezing started when about 3-4 days ago 3. PATTERN "Does the difficult breathing come and go, or has it been constant since it started?"      intermittent 4. SEVERITY: "How bad is your breathing?" (e.g., mild, moderate, severe)    - MILD: No SOB at rest, mild SOB with walking, speaks normally in sentences, can lie down, no retractions,  pulse < 100.    - MODERATE: SOB at rest, SOB with minimal exertion and prefers to sit, cannot lie down flat, speaks in phrases, mild retractions, audible wheezing, pulse 100-120.    - SEVERE: Very SOB at rest, speaks in single words, struggling to breathe, sitting hunched forward, retractions, pulse > 120      mild 5. RECURRENT SYMPTOM: "Have you had difficulty breathing before?" If Yes, ask: "When was the last time?" and "What happened that time?"      Yes, during allergy season 6. CARDIAC HISTORY: "Do you have any history of heart disease?" (e.g., heart attack, angina, bypass surgery, angioplasty)      denies 7. LUNG HISTORY: "Do you have any history of lung disease?"  (e.g., pulmonary embolus, asthma, emphysema)     asthma 8. CAUSE: "What do you think is causing the breathing problem?"      Postnasal drip 9. OTHER SYMPTOMS: "Do you have any other symptoms? (e.g., dizziness, runny nose, cough, chest pain, fever)     Cough, runny nose, Denies fever, denies CP 10. O2 SATURATION MONITOR:  "Do you use an oxygen saturation monitor (pulse oximeter) at home?" If Yes, ask: "What is your reading (oxygen level) today?" "What is your usual oxygen saturation reading?" (e.g., 95%)       Does not have equip 12. TRAVEL: "Have you traveled out of the country in the last month?" (e.g., travel history, exposures)       denies  Protocols used: Breathing Difficulty-A-AH

## 2023-08-25 NOTE — Telephone Encounter (Signed)
 She should be on the last few days of her antibiotic.  Recommend she finish her course of antibiotics and let us know if not improving.

## 2023-08-30 DIAGNOSIS — M25461 Effusion, right knee: Secondary | ICD-10-CM | POA: Diagnosis not present

## 2023-08-30 DIAGNOSIS — M25661 Stiffness of right knee, not elsewhere classified: Secondary | ICD-10-CM | POA: Diagnosis not present

## 2023-08-30 DIAGNOSIS — Z96651 Presence of right artificial knee joint: Secondary | ICD-10-CM | POA: Diagnosis not present

## 2023-09-02 DIAGNOSIS — M25661 Stiffness of right knee, not elsewhere classified: Secondary | ICD-10-CM | POA: Diagnosis not present

## 2023-09-02 DIAGNOSIS — M25461 Effusion, right knee: Secondary | ICD-10-CM | POA: Diagnosis not present

## 2023-09-02 DIAGNOSIS — Z96651 Presence of right artificial knee joint: Secondary | ICD-10-CM | POA: Diagnosis not present

## 2023-09-05 DIAGNOSIS — M25461 Effusion, right knee: Secondary | ICD-10-CM | POA: Diagnosis not present

## 2023-09-05 DIAGNOSIS — M25661 Stiffness of right knee, not elsewhere classified: Secondary | ICD-10-CM | POA: Diagnosis not present

## 2023-09-05 DIAGNOSIS — Z96651 Presence of right artificial knee joint: Secondary | ICD-10-CM | POA: Diagnosis not present

## 2023-09-08 ENCOUNTER — Ambulatory Visit: Admitting: Allergy & Immunology

## 2023-09-08 ENCOUNTER — Other Ambulatory Visit: Payer: Self-pay

## 2023-09-08 ENCOUNTER — Encounter: Payer: Self-pay | Admitting: Allergy & Immunology

## 2023-09-08 VITALS — BP 120/70 | HR 90 | Temp 97.6°F | Resp 12 | Ht 63.78 in | Wt 205.4 lb

## 2023-09-08 DIAGNOSIS — K219 Gastro-esophageal reflux disease without esophagitis: Secondary | ICD-10-CM

## 2023-09-08 DIAGNOSIS — J3089 Other allergic rhinitis: Secondary | ICD-10-CM

## 2023-09-08 DIAGNOSIS — L299 Pruritus, unspecified: Secondary | ICD-10-CM

## 2023-09-08 DIAGNOSIS — J4540 Moderate persistent asthma, uncomplicated: Secondary | ICD-10-CM

## 2023-09-08 MED ORDER — RIZATRIPTAN BENZOATE 10 MG PO TABS: 10.0000 mg | ORAL_TABLET | ORAL | 2 refills | Status: DC | PRN

## 2023-09-08 MED ORDER — FAMOTIDINE 40 MG PO TABS: 40.0000 mg | ORAL_TABLET | Freq: Every day | ORAL | 1 refills | Status: AC

## 2023-09-08 NOTE — Progress Notes (Unsigned)
 FOLLOW UP  Date of Service/Encounter:  09/08/23   Assessment:   Moderate persistent asthma, uncomplicated   LPRD (laryngopharyngeal reflux disease)   Allergic rhinitis   Pruritus - improved with Claritin and Atarax     Plan/Recommendations:   Assessment and Plan Assessment & Plan      Patient Instructions  1. Moderate persistent asthma, uncomplicated - Lung testing looked stellar today.  - I am sorry that you got with this March illness, but you seem to have done well nonetheless. - Daily controller medication(s): Advair 500/36mcg one puff twice daily - Prior to physical activity: albuterol 2 puffs 10-15 minutes before physical activity - Rescue medications: albuterol 4 puffs every 4-6 hours as needed - Asthma control goals:  * Full participation in all desired activities (may need albuterol before activity) * Albuterol use two time or less a week on average (not counting use with activity) * Cough interfering with sleep two time or less a month * Oral steroids no more than once a year * No hospitalizations  2. LPRD (laryngopharyngeal reflux disease) - Continue with famotidine in the morning. - Continue with pantoprazole at night.   3. Allergic rhinitis - Continue with Flonase one spray per nostril daily EVERY DAY.  - Continue with Astelin one spray per nostril twice daily EVERY DAY. - Continue with the Claritin for your itching.   4. Pruritis (itching) - Continue with Claritin once daily.  - Continue with Atarax 25mg  twice daily as needed (can cause sleepiness).  - Moisturize moisturize moisturize!  - Continue with hydrocortisone 2.5% ointment twice daily to keep inflammation on the skin under control.   5. Return in about 3 months (around 12/08/2023). You can have the follow up appointment with Dr. Dellis Anes or a Nurse Practicioner (our Nurse Practitioners are excellent and always have Physician oversight!).    Please inform us of any Emergency Department  visits, hospitalizations, or changes in symptoms. Call us before going to the ED for breathing or allergy symptoms since we might be able to fit you in for a sick visit. Feel free to contact us anytime with any questions, problems, or concerns.  It was a pleasure to see you again today!  Websites that have reliable patient information: 1. American Academy of Asthma, Allergy, and Immunology: www.aaaai.org 2. Food Allergy Research and Education (FARE): foodallergy.org 3. Mothers of Asthmatics: http://www.asthmacommunitynetwork.org 4. American College of Allergy, Asthma, and Immunology: www.acaai.org      "Like" [place] on Facebook and Instagram for our latest updates!      A healthy democracy works best when [employer] participate! Make sure you are registered to vote! If you have moved or changed any of your contact information, you will need to get this updated before voting! Scan the QR codes below to learn more!             Subjective:   Brooke Schneider is a 73 y.o. female presenting today for follow up of  Chief Complaint  Patient presents with  . Allergies  . Asthma    Claudius Schneider has a history of the following: Patient Active Problem List   Diagnosis Date Noted  . Prediabetes 01/03/2023  . GERD (gastroesophageal reflux disease) 07/17/2019  . OA (osteoarthritis) of knee s/p TKA 2021 04/13/2019  . MDD (major depressive disorder) 09/13/2016  . Tinea versicolor 07/06/2016  . IBS (irritable bowel syndrome) 01/02/2016  . Hypothyroidism 10/17/2015  . Migraine 11/27/2012  . Insomnia 07/21/2009  . Allergies 01/19/2008  .  Asthma, allergic 01/19/2008    History obtained from: chart review and {Persons; PED relatives w/patient:[id number]::"patient"}.  Discussed the use of AI scribe software for clinical note transcription with the patient and/or guardian, who gave verbal consent to proceed.  Brooke Schneider is a 73 y.o. female presenting for {Blank single:19197::"a food  challenge","a drug challenge","skin testing","a sick visit","an evaluation of ***","a follow up visit"}.  She was last seen in September 2024.  At that time, lung testing was not done.  We continue with Advair 500 mcg 1 puff twice daily as well as albuterol as needed.  For her reflux, we continue with famotidine and Protonix.  For her allergic rhinitis, we continue with Flonase, Astelin, and Claritin.  For her itching, we continue with Atarax as well as Claritin and hydrocortisone.  Since last visit,   Asthma/Respiratory Symptom History: ***  CXR (08/25/2023): Cardiovascular: Cardiac silhouette and pulmonary vasculature are within normal limits. Mediastinum: Within normal limits. Lungs/pleura: No pulmonary consolidation or edema. No pleural effusion or pneumothorax. Upper abdomen: Visualized portions are unremarkable. Chest wall/osseous structures: Diffuse idiopathic skeletal hyperostosis. No acute fracture. Polyarticular degenerative changes. Right superior rotator cuff calcific tendinosis.   Allergic Rhinitis Symptom History: ***  Food Allergy Symptom History: ***  Skin Symptom History: ***  GERD Symptom History: ***  Infection Symptom History: ***  Otherwise, there have been no changes to her past medical history, surgical history, family history, or social history.    Review of systems otherwise negative other than that mentioned in the HPI.    Objective:   Blood pressure 120/70, pulse 90, temperature 97.6 F (36.4 C), temperature source Temporal, resp. rate 12, height 5' 3.78" (1.62 m), weight 205 lb 6.4 oz (93.2 kg), SpO2 95%. Body mass index is 35.5 kg/m.    Physical Exam Vitals reviewed.  Constitutional:      Appearance: Normal appearance. She is well-developed.     Comments: Very lovely and cooperative with the exam. Talkative.   HENT:     Head: Normocephalic and atraumatic.     Right Ear: Tympanic membrane, ear canal and external ear normal. No drainage,  swelling or tenderness. Tympanic membrane is not injected, scarred, erythematous, retracted or bulging.     Left Ear: Tympanic membrane, ear canal and external ear normal. No drainage, swelling or tenderness. Tympanic membrane is not injected, scarred, erythematous, retracted or bulging.     Nose: No nasal deformity, septal deviation, mucosal edema or rhinorrhea.     Right Turbinates: Enlarged, swollen and pale.     Left Turbinates: Enlarged, swollen and pale.     Right Sinus: No maxillary sinus tenderness or frontal sinus tenderness.     Left Sinus: No maxillary sinus tenderness or frontal sinus tenderness.     Comments: No nasal polyps noted.     Mouth/Throat:     Lips: Pink.     Mouth: Mucous membranes are moist. Mucous membranes are not pale and not dry.     Pharynx: Uvula midline.     Comments: Mild cobblestoning in the posterior oropharynx. Eyes:     General: Lids are normal. Allergic shiner present.        Right eye: No discharge.        Left eye: No discharge.     Conjunctiva/sclera: Conjunctivae normal.     Right eye: Right conjunctiva is not injected. No chemosis.    Left eye: Left conjunctiva is not injected. No chemosis.    Pupils: Pupils are equal, round, and reactive to light.  Cardiovascular:     Rate and Rhythm: Normal rate and regular rhythm.     Heart sounds: Normal heart sounds.  Pulmonary:     Effort: Pulmonary effort is normal. No tachypnea, accessory muscle usage or respiratory distress.     Breath sounds: Normal breath sounds. No wheezing, rhonchi or rales.     Comments: Moving air well in all lung fields. No increased work of breathing noted.  Chest:     Chest wall: No tenderness.  Abdominal:     Tenderness: There is no abdominal tenderness. There is no guarding or rebound.  Lymphadenopathy:     Head:     Right side of head: No submandibular, tonsillar or occipital adenopathy.     Left side of head: No submandibular, tonsillar or occipital adenopathy.      Cervical: No cervical adenopathy.  Skin:    General: Skin is warm.     Capillary Refill: Capillary refill takes less than 2 seconds.     Coloration: Skin is not pale.     Findings: No abrasion, erythema, petechiae or rash. Rash is not papular, urticarial or vesicular.     Comments: She does have some lesions on her bilateral arms that are healed over. There are some excoriations on the upper back.   Neurological:     Mental Status: She is alert.  Psychiatric:        Behavior: Behavior is cooperative.     Diagnostic studies:    Spirometry: results normal (FEV1: 1.96/107%, FVC: 2.92/124%, FEV1/FVC: 67%).    Spirometry consistent with normal pattern.   Allergy Studies: none       Malachi Bonds, MD  [facility]

## 2023-09-08 NOTE — Patient Instructions (Addendum)
 1. Moderate persistent asthma, uncomplicated - Lung testing looked stellar today.  - I am sorry that you got with this March illness, but you seem to have done well nonetheless. - Daily controller medication(s): Advair 500/32mcg one puff twice daily - Prior to physical activity: albuterol 2 puffs 10-15 minutes before physical activity - Rescue medications: albuterol 4 puffs every 4-6 hours as needed - Asthma control goals:  * Full participation in all desired activities (may need albuterol before activity) * Albuterol use two time or less a week on average (not counting use with activity) * Cough interfering with sleep two time or less a month * Oral steroids no more than once a year * No hospitalizations  2. LPRD (laryngopharyngeal reflux disease) - Continue with famotidine in the morning. - Continue with pantoprazole at night.   3. Allergic rhinitis - Continue with Flonase one spray per nostril daily EVERY DAY.  - Continue with Astelin one spray per nostril twice daily EVERY DAY. - Continue with the Claritin for your itching.   4. Pruritis (itching) - Continue with Claritin once daily.  - Continue with Atarax 25mg  twice daily as needed (can cause sleepiness).  - Moisturize moisturize moisturize!  - Continue with hydrocortisone 2.5% ointment twice daily to keep inflammation on the skin under control.   5. Return in about 3 months (around 12/08/2023). You can have the follow up appointment with Dr. Dellis Anes or a Nurse Practicioner (our Nurse Practitioners are excellent and always have Physician oversight!).    Please inform us of any Emergency Department visits, hospitalizations, or changes in symptoms. Call us before going to the ED for breathing or allergy symptoms since we might be able to fit you in for a sick visit. Feel free to contact us anytime with any questions, problems, or concerns.  It was a pleasure to see you again today!  Websites that have reliable patient  information: 1. American Academy of Asthma, Allergy, and Immunology: www.aaaai.org 2. Food Allergy Research and Education (FARE): foodallergy.org 3. Mothers of Asthmatics: http://www.asthmacommunitynetwork.org 4. American College of Allergy, Asthma, and Immunology: www.acaai.org      "Like" [place] on Facebook and Instagram for our latest updates!      A healthy democracy works best when [employer] participate! Make sure you are registered to vote! If you have moved or changed any of your contact information, you will need to get this updated before voting! Scan the QR codes below to learn more!

## 2023-09-09 DIAGNOSIS — Z96651 Presence of right artificial knee joint: Secondary | ICD-10-CM | POA: Diagnosis not present

## 2023-09-09 DIAGNOSIS — M25461 Effusion, right knee: Secondary | ICD-10-CM | POA: Diagnosis not present

## 2023-09-09 DIAGNOSIS — M25661 Stiffness of right knee, not elsewhere classified: Secondary | ICD-10-CM | POA: Diagnosis not present

## 2023-09-14 DIAGNOSIS — M25461 Effusion, right knee: Secondary | ICD-10-CM | POA: Diagnosis not present

## 2023-09-14 DIAGNOSIS — M25661 Stiffness of right knee, not elsewhere classified: Secondary | ICD-10-CM | POA: Diagnosis not present

## 2023-09-14 DIAGNOSIS — Z96651 Presence of right artificial knee joint: Secondary | ICD-10-CM | POA: Diagnosis not present

## 2023-09-16 DIAGNOSIS — M25661 Stiffness of right knee, not elsewhere classified: Secondary | ICD-10-CM | POA: Diagnosis not present

## 2023-09-16 DIAGNOSIS — M25461 Effusion, right knee: Secondary | ICD-10-CM | POA: Diagnosis not present

## 2023-09-16 DIAGNOSIS — Z96651 Presence of right artificial knee joint: Secondary | ICD-10-CM | POA: Diagnosis not present

## 2023-09-22 DIAGNOSIS — M25461 Effusion, right knee: Secondary | ICD-10-CM | POA: Diagnosis not present

## 2023-09-22 DIAGNOSIS — M25661 Stiffness of right knee, not elsewhere classified: Secondary | ICD-10-CM | POA: Diagnosis not present

## 2023-09-22 DIAGNOSIS — Z96651 Presence of right artificial knee joint: Secondary | ICD-10-CM | POA: Diagnosis not present

## 2023-09-27 DIAGNOSIS — M25461 Effusion, right knee: Secondary | ICD-10-CM | POA: Diagnosis not present

## 2023-09-27 DIAGNOSIS — Z96651 Presence of right artificial knee joint: Secondary | ICD-10-CM | POA: Diagnosis not present

## 2023-09-27 DIAGNOSIS — M25661 Stiffness of right knee, not elsewhere classified: Secondary | ICD-10-CM | POA: Diagnosis not present

## 2023-09-29 ENCOUNTER — Encounter: Payer: Self-pay | Admitting: Family Medicine

## 2023-09-29 ENCOUNTER — Ambulatory Visit (INDEPENDENT_AMBULATORY_CARE_PROVIDER_SITE_OTHER): Admitting: Family Medicine

## 2023-09-29 VITALS — BP 122/76 | HR 89 | Temp 97.3°F | Ht 63.0 in | Wt 207.4 lb

## 2023-09-29 DIAGNOSIS — B360 Pityriasis versicolor: Secondary | ICD-10-CM | POA: Diagnosis not present

## 2023-09-29 DIAGNOSIS — B351 Tinea unguium: Secondary | ICD-10-CM | POA: Diagnosis not present

## 2023-09-29 DIAGNOSIS — T7840XS Allergy, unspecified, sequela: Secondary | ICD-10-CM

## 2023-09-29 DIAGNOSIS — J302 Other seasonal allergic rhinitis: Secondary | ICD-10-CM

## 2023-09-29 MED ORDER — TERBINAFINE HCL 250 MG PO TABS
250.0000 mg | ORAL_TABLET | Freq: Every day | ORAL | 0 refills | Status: DC
Start: 2023-09-29 — End: 2023-12-13

## 2023-09-29 MED ORDER — ERYTHROMYCIN 5 MG/GM OP OINT: 1.0000 | TOPICAL_OINTMENT | Freq: Every day | OPHTHALMIC | 0 refills | Status: DC

## 2023-09-29 MED ORDER — KETOCONAZOLE 2 % EX CREA: 1.0000 | TOPICAL_CREAM | Freq: Two times a day (BID) | CUTANEOUS | 0 refills | Status: DC

## 2023-09-29 NOTE — Assessment & Plan Note (Signed)
 Following with allergy.  Still having occasional conjunctivitis.  Recommend that she restart Pataday  as needed.  Will also refill her erythromycin  ointment.  She can continue regimen per allergist otherwise with Advair, albuterol , Astelin , and Flonase .

## 2023-09-29 NOTE — Progress Notes (Signed)
   Brooke Schneider is a 73 y.o. female who presents today for an office visit.  Assessment/Plan:  New/Acute Problems: Onychomycosis No red flags.  We discussed referral to podiatry versus trial of terbinafine .  LFTs several months ago were normal.  We discussed potential side effects of terbinafine .  Will start terbinafine  250 mg daily.  For the next 12 weeks.  She will come back in 6 to 12 weeks and we can recheck labs at that time with her annual physical.  We discussed reasons to return to care.  If no improvement with this will need referral to podiatry.  Chronic Problems Addressed Today: Allergies Following with allergy.  Still having occasional conjunctivitis.  Recommend that she restart Pataday  as needed.  Will also refill her erythromycin  ointment.  She can continue regimen per allergist otherwise with Advair, albuterol , Astelin , and Flonase .  Tinea versicolor She has had a mild flare recently.  Will refill ketoconazole .  This is giving her some improvement in the past.  We are also starting terbinafine  as above for her onychomycosis which should help as well.  If not improving with this we can refer to dermatology.     Subjective:  HPI:  See assessment / plan for status of chronic conditions. Patient here today with rash on her chest. This has been a recurrent issue for several years but has flared up again recently. She has had modest improvement with ketoconazole  in the past.  She would like a refill on this today.  She is still having ongoing issues with allergies in her eyes.  We saw her several weeks ago for this.  Instructed her to start Pataday  drops.  She has also been using arthritis and ointment occasionally as well which does seem to help.  She has noticed thickening nails in bilateral feet.  This has been going on for several months to years.  She tries keeping them trimmed however this is difficult.  No other treatments tried.        Objective:  Physical Exam: BP  122/76   Pulse 89   Temp (!) 97.3 F (36.3 C) (Temporal)   Ht 5\' 3"  (1.6 m)   Wt 207 lb 6.4 oz (94.1 kg)   SpO2 98%   BMI 36.74 kg/m   Gen: No acute distress, resting comfortably CV: Regular rate and rhythm with no murmurs appreciated Pulm: Normal work of breathing, clear to auscultation bilaterally with no crackles, wheezes, or rhonchi MUSCULOSKELETAL:  - Feet: Bunion noted on right foot.  Bilateral toes with onychomycosis. Neuro: Grossly normal, moves all extremities Psych: Normal affect and thought content      Tc Kapusta M. Daneil Dunker, MD 09/29/2023 1:21 PM

## 2023-09-29 NOTE — Patient Instructions (Signed)
 It was very nice to see you today!  You have fungus in your toenail.  Please start the terbinafine .  Take 1 pill daily for the next 12 weeks.  Please use the ketoconazole  for the rash on your chest.  Please use Pataday  for your eyes.  I will also refill your erythromycin  ointment.  Please come back soon for your physical.  Return in about 3 months (around 12/29/2023) for Annual Physical.   Take care, Dr Daneil Dunker  PLEASE NOTE:  If you had any lab tests, please let us  know if you have not heard back within a few days. You may see your results on mychart before we have a chance to review them but we will give you a call once they are reviewed by us .   If we ordered any referrals today, please let us  know if you have not heard from their office within the next week.   If you had any urgent prescriptions sent in today, please check with the pharmacy within an hour of our visit to make sure the prescription was transmitted appropriately.   Please try these tips to maintain a healthy lifestyle:  Eat at least 3 REAL meals and 1-2 snacks per day.  Aim for no more than 5 hours between eating.  If you eat breakfast, please do so within one hour of getting up.   Each meal should contain half fruits/vegetables, one quarter protein, and one quarter carbs (no bigger than a computer mouse)  Cut down on sweet beverages. This includes juice, soda, and sweet tea.   Drink at least 1 glass of water with each meal and aim for at least 8 glasses per day  Exercise at least 150 minutes every week.

## 2023-09-29 NOTE — Assessment & Plan Note (Signed)
 She has had a mild flare recently.  Will refill ketoconazole .  This is giving her some improvement in the past.  We are also starting terbinafine  as above for her onychomycosis which should help as well.  If not improving with this we can refer to dermatology.

## 2023-09-30 DIAGNOSIS — Z96651 Presence of right artificial knee joint: Secondary | ICD-10-CM | POA: Diagnosis not present

## 2023-09-30 DIAGNOSIS — M25461 Effusion, right knee: Secondary | ICD-10-CM | POA: Diagnosis not present

## 2023-09-30 DIAGNOSIS — M25661 Stiffness of right knee, not elsewhere classified: Secondary | ICD-10-CM | POA: Diagnosis not present

## 2023-10-04 DIAGNOSIS — M25661 Stiffness of right knee, not elsewhere classified: Secondary | ICD-10-CM | POA: Diagnosis not present

## 2023-10-04 DIAGNOSIS — Z96651 Presence of right artificial knee joint: Secondary | ICD-10-CM | POA: Diagnosis not present

## 2023-10-04 DIAGNOSIS — M25461 Effusion, right knee: Secondary | ICD-10-CM | POA: Diagnosis not present

## 2023-10-12 ENCOUNTER — Other Ambulatory Visit: Payer: Self-pay | Admitting: *Deleted

## 2023-10-12 DIAGNOSIS — M25461 Effusion, right knee: Secondary | ICD-10-CM | POA: Diagnosis not present

## 2023-10-12 DIAGNOSIS — Z96651 Presence of right artificial knee joint: Secondary | ICD-10-CM | POA: Diagnosis not present

## 2023-10-12 DIAGNOSIS — M25661 Stiffness of right knee, not elsewhere classified: Secondary | ICD-10-CM | POA: Diagnosis not present

## 2023-10-12 NOTE — Telephone Encounter (Signed)
 Pharmacy requesting Rx Ambien  refill

## 2023-10-13 MED ORDER — ZOLPIDEM TARTRATE 10 MG PO TABS
10.0000 mg | ORAL_TABLET | Freq: Every day | ORAL | 1 refills | Status: DC
Start: 2023-10-13 — End: 2024-01-10

## 2023-10-14 DIAGNOSIS — M25461 Effusion, right knee: Secondary | ICD-10-CM | POA: Diagnosis not present

## 2023-10-14 DIAGNOSIS — M25661 Stiffness of right knee, not elsewhere classified: Secondary | ICD-10-CM | POA: Diagnosis not present

## 2023-10-14 DIAGNOSIS — Z96651 Presence of right artificial knee joint: Secondary | ICD-10-CM | POA: Diagnosis not present

## 2023-10-18 DIAGNOSIS — Z96651 Presence of right artificial knee joint: Secondary | ICD-10-CM | POA: Diagnosis not present

## 2023-10-18 DIAGNOSIS — M25461 Effusion, right knee: Secondary | ICD-10-CM | POA: Diagnosis not present

## 2023-10-18 DIAGNOSIS — M25661 Stiffness of right knee, not elsewhere classified: Secondary | ICD-10-CM | POA: Diagnosis not present

## 2023-10-19 DIAGNOSIS — N952 Postmenopausal atrophic vaginitis: Secondary | ICD-10-CM | POA: Diagnosis not present

## 2023-10-19 DIAGNOSIS — Z6834 Body mass index (BMI) 34.0-34.9, adult: Secondary | ICD-10-CM | POA: Diagnosis not present

## 2023-10-19 DIAGNOSIS — Z1231 Encounter for screening mammogram for malignant neoplasm of breast: Secondary | ICD-10-CM | POA: Diagnosis not present

## 2023-10-19 DIAGNOSIS — Z01419 Encounter for gynecological examination (general) (routine) without abnormal findings: Secondary | ICD-10-CM | POA: Diagnosis not present

## 2023-10-19 DIAGNOSIS — N762 Acute vulvitis: Secondary | ICD-10-CM | POA: Diagnosis not present

## 2023-10-25 DIAGNOSIS — M25661 Stiffness of right knee, not elsewhere classified: Secondary | ICD-10-CM | POA: Diagnosis not present

## 2023-10-25 DIAGNOSIS — M25461 Effusion, right knee: Secondary | ICD-10-CM | POA: Diagnosis not present

## 2023-10-25 DIAGNOSIS — Z96651 Presence of right artificial knee joint: Secondary | ICD-10-CM | POA: Diagnosis not present

## 2023-10-28 DIAGNOSIS — M25661 Stiffness of right knee, not elsewhere classified: Secondary | ICD-10-CM | POA: Diagnosis not present

## 2023-10-28 DIAGNOSIS — Z96651 Presence of right artificial knee joint: Secondary | ICD-10-CM | POA: Diagnosis not present

## 2023-10-28 DIAGNOSIS — M25461 Effusion, right knee: Secondary | ICD-10-CM | POA: Diagnosis not present

## 2023-11-08 DIAGNOSIS — M25661 Stiffness of right knee, not elsewhere classified: Secondary | ICD-10-CM | POA: Diagnosis not present

## 2023-11-08 DIAGNOSIS — M25461 Effusion, right knee: Secondary | ICD-10-CM | POA: Diagnosis not present

## 2023-11-08 DIAGNOSIS — Z96651 Presence of right artificial knee joint: Secondary | ICD-10-CM | POA: Diagnosis not present

## 2023-11-09 ENCOUNTER — Telehealth: Payer: Self-pay | Admitting: Family Medicine

## 2023-11-18 ENCOUNTER — Telehealth: Payer: Self-pay | Admitting: *Deleted

## 2023-11-18 NOTE — Telephone Encounter (Signed)
 Copied from CRM [id number]. Topic: Clinical - Order For Equipment >> Nov 18, 2023 10:44 AM Mesmerise C wrote: Reason for CRM: Fredrik Jensen from [facility] needing an update about order sent in for nebulizer machine sent a fax over please call back for update [phone]   Please advise  [facility]

## 2023-11-18 NOTE — Telephone Encounter (Signed)
 Can we clarify? I don't think we've ever prescribed a nebulizer for her.  Jinny Mounts. Daneil Dunker, MD 11/18/2023 9:44 PM

## 2023-11-21 NOTE — Telephone Encounter (Signed)
 Error

## 2023-11-22 NOTE — Telephone Encounter (Signed)
 Called [phone] spoke with Fredrik Jensen  Notified PCP did not prescribed nebulizer  Unable to sign order

## 2023-12-08 ENCOUNTER — Other Ambulatory Visit: Payer: Self-pay | Admitting: Allergy & Immunology

## 2023-12-13 ENCOUNTER — Ambulatory Visit (INDEPENDENT_AMBULATORY_CARE_PROVIDER_SITE_OTHER): Admitting: Allergy & Immunology

## 2023-12-13 ENCOUNTER — Other Ambulatory Visit: Payer: Self-pay

## 2023-12-13 VITALS — BP 120/78 | HR 86 | Temp 97.2°F | Ht 63.0 in | Wt 202.7 lb

## 2023-12-13 DIAGNOSIS — L299 Pruritus, unspecified: Secondary | ICD-10-CM | POA: Diagnosis not present

## 2023-12-13 DIAGNOSIS — J4540 Moderate persistent asthma, uncomplicated: Secondary | ICD-10-CM

## 2023-12-13 DIAGNOSIS — K219 Gastro-esophageal reflux disease without esophagitis: Secondary | ICD-10-CM | POA: Diagnosis not present

## 2023-12-13 DIAGNOSIS — J3089 Other allergic rhinitis: Secondary | ICD-10-CM | POA: Diagnosis not present

## 2023-12-13 MED ORDER — FLUTICASONE-SALMETEROL 500-50 MCG/ACT IN AEPB: 1.0000 | INHALATION_SPRAY | Freq: Two times a day (BID) | RESPIRATORY_TRACT | 5 refills | Status: AC

## 2023-12-13 MED ORDER — AZELASTINE HCL 0.1 % NA SOLN: 2.0000 | Freq: Two times a day (BID) | NASAL | 1 refills | Status: AC

## 2023-12-13 MED ORDER — HYDROXYZINE HCL 25 MG PO TABS: ORAL_TABLET | ORAL | 5 refills | Status: AC

## 2023-12-13 MED ORDER — FLUTICASONE PROPIONATE 50 MCG/ACT NA SUSP: NASAL | 1 refills | Status: DC

## 2023-12-13 NOTE — Progress Notes (Unsigned)
   FOLLOW UP  Date of Service/Encounter:  12/13/23   Assessment:   LPRD (laryngopharyngeal reflux disease)   Allergic rhinitis   Pruritus - improved with Claritin  and Atarax      Plan/Recommendations:   There are no Patient Instructions on file for this visit.   Subjective:   Brooke Schneider is a 73 y.o. female presenting today for follow up of  Chief Complaint  Patient presents with   Follow-up    Wants to discuss wixela     Brooke Schneider has a history of the following: Patient Active Problem List   Diagnosis Date Noted   Prediabetes 01/03/2023   GERD (gastroesophageal reflux disease) 07/17/2019   OA (osteoarthritis) of knee s/p TKA 2021 04/13/2019   MDD (major depressive disorder) 09/13/2016   Tinea versicolor 07/06/2016   IBS (irritable bowel syndrome) 01/02/2016   Hypothyroidism 10/17/2015   Migraine 11/27/2012   Insomnia 07/21/2009   Allergies 01/19/2008   Asthma, allergic 01/19/2008    History obtained from: chart review and {Persons; PED relatives w/patient:[id number]::patient}.  Discussed the use of AI scribe software for clinical note transcription with the patient and/or guardian, who gave verbal consent to proceed.  Brooke Schneider is a 73 y.o. female presenting for {Blank single:19197::a food challenge,a drug challenge,skin testing,a sick visit,an evaluation of ***,a follow up visit}.  She was last seen in April 2025.  At that time, lung testing looks stellar.  We continued her on Advair 500/50 mcg 1 puff twice daily as well as albuterol  as needed.  For her reflux, we continue with famotidine  in the morning and Protonix  at night.  We also continue with Flonase  and Astelin  as well as Claritin  for her allergic rhinitis.  For her itching, we continue with Atarax  25 mg twice daily as needed as well as hydrocortisone  ointment twice daily.  Since last visit,  Asthma/Respiratory Symptom History: ***  Allergic Rhinitis Symptom History: ***  Food Allergy  Symptom History: ***  Skin Symptom History: ***  GERD Symptom History: ***  Infection Symptom History: ***  Otherwise, there have been no changes to her past medical history, surgical history, family history, or social history.    Review of systems otherwise negative other than that mentioned in the HPI.    Objective:   There were no vitals taken for this visit. There is no height or weight on file to calculate BMI.    Physical Exam   Diagnostic studies:    Spirometry: results normal (FEV1: 1.95/107%, FVC: 2.50/106%, FEV1/FVC: 78%).    Spirometry consistent with normal pattern. {Blank single:19197::Albuterol /Atrovent  nebulizer,Xopenex/Atrovent  nebulizer,Albuterol  nebulizer,Albuterol  four puffs via MDI,Xopenex four puffs via MDI} treatment given in clinic with {Blank single:19197::significant improvement in FEV1 per ATS criteria,significant improvement in FVC per ATS criteria,significant improvement in FEV1 and FVC per ATS criteria,improvement in FEV1, but not significant per ATS criteria,improvement in FVC, but not significant per ATS criteria,improvement in FEV1 and FVC, but not significant per ATS criteria,no improvement}.  Allergy Studies: {Blank single:19197::none,deferred due to recent antihistamine use,deferred due to insurance stipulations that require a separate visit for testing,labs sent instead, }    {Blank single:19197::Allergy testing results were read and interpreted by myself, documented by clinical staff., }      Marty Shaggy, MD  [facility]

## 2023-12-13 NOTE — Patient Instructions (Addendum)
 1. Moderate persistent asthma, uncomplicated - Lung testing looked phenomenal today.  - We are sending in BRAND NAME Advair, so hopefully that works better than the [employer].  - Daily controller medication(s): Advair 500/102mcg one puff twice daily - Prior to physical activity: albuterol  2 puffs 10-15 minutes before physical activity - Rescue medications: albuterol  4 puffs every 4-6 hours as needed - Asthma control goals:  * Full participation in all desired activities (may need albuterol  before activity) * Albuterol  use two time or less a week on average (not counting use with activity) * Cough interfering with sleep two time or less a month * Oral steroids no more than once a year * No hospitalizations  2. LPRD (laryngopharyngeal reflux disease) - Continue with famotidine  in the morning. - Continue with pantoprazole  at night.   3. Allergic rhinitis - Continue with Flonase  one spray per nostril daily EVERY DAY.  - Continue with Astelin  one spray per nostril twice daily EVERY DAY. - Continue with the Claritin  for your itching.  4. Pruritis (itching) - Continue with Claritin  once daily.  - Continue with Atarax  25mg  every 8 hours as needed.  - Moisturize moisturize moisturize!  - Continue with hydrocortisone  2.5% ointment twice daily to keep inflammation on the skin under control.   5. Return in about 6 months (around 06/14/2024). You can have the follow up appointment with Dr. Iva or a Nurse Practicioner (our Nurse Practitioners are excellent and always have Physician oversight!).    Please inform us  of any Emergency Department visits, hospitalizations, or changes in symptoms. Call us  before going to the ED for breathing or allergy symptoms since we might be able to fit you in for a sick visit. Feel free to contact us  anytime with any questions, problems, or concerns.  It was a pleasure to see you again today!  Websites that have reliable patient information: 1. American Academy  of Asthma, Allergy, and Immunology: www.aaaai.org 2. Food Allergy Research and Education (FARE): foodallergy.org 3. Mothers of Asthmatics: http://www.asthmacommunitynetwork.org 4. American College of Allergy, Asthma, and Immunology: www.acaai.org      "Like" us  on Facebook and Instagram for our latest updates!      A healthy democracy works best when [employer] participate! Make sure you are registered to vote! If you have moved or changed any of your contact information, you will need to get this updated before voting! Scan the QR codes below to learn more!

## 2023-12-14 ENCOUNTER — Encounter: Payer: Self-pay | Admitting: Allergy & Immunology

## 2023-12-14 NOTE — Addendum Note (Signed)
 Addended by: NANCEE JON SAILOR on: 12/14/2023 04:47 PM   Modules accepted: Orders

## 2023-12-21 NOTE — Telephone Encounter (Signed)
 Copied from CRM [id number]. Topic: Clinical - Medical Advice >> Dec 21, 2023  9:28 AM Thersia BROCKS wrote: Reason for CRM: Lonell from [facility] called in stated patient was seen at one of their urgent cares, was prescribed a nebulizer when she was there, stated sent to Urgent care provider but they would note sign order for this, and she has also sent it to her primary provider Dr.Parker and he will not signed it since he did not prescribe it. Lonell wanted to know if there was any provider there that could signed the order for the patient her callback number is  [phone]   ----------------------------------------------------------------------- From previous Reason for Contact - Medication Question: Reason for CRM:   Unable to sign since we have not order nebulizer  [facility]

## 2023-12-23 ENCOUNTER — Telehealth: Payer: Self-pay | Admitting: Family Medicine

## 2023-12-23 NOTE — Telephone Encounter (Signed)
 Can we clarify? I can't sign an order for a nebulizer that was ordered at urgent care, especially one outside of [place].   Worth HERO. Kennyth, MD 12/23/2023 12:55 PM

## 2023-12-23 NOTE — Telephone Encounter (Signed)
 Please review request from [facility].    Copied from CRM [id number]. Topic: General - Other >> Dec 23, 2023 12:04 PM Burnard DEL wrote: Reason for CRM: Lonell from [facility] called stating that patient  was prescribed a  nebulizer at an urgent care,and urgent care usually does not sign off on orders. She stated that they are just trying to get paid for the nebulizer and just needs an signature.  CB#: [phone]

## 2023-12-23 NOTE — Telephone Encounter (Signed)
 Brooke Schneider with [facility] and she stated the patient was in fact not seen at a [place] urgent care. [name] that we cannot sign orders that were not placed within [place]. [name] that the patient will need to make an appt with Dr. Kennyth to be evaluated for anything further needed. Schneider advised she will be calling the patient. No further questions at this time.

## 2024-01-03 ENCOUNTER — Ambulatory Visit: Admitting: Family Medicine

## 2024-01-10 ENCOUNTER — Ambulatory Visit: Payer: Medicare Other

## 2024-01-10 ENCOUNTER — Encounter: Payer: Self-pay | Admitting: Family Medicine

## 2024-01-10 ENCOUNTER — Ambulatory Visit (INDEPENDENT_AMBULATORY_CARE_PROVIDER_SITE_OTHER): Admitting: Family Medicine

## 2024-01-10 VITALS — BP 110/75 | HR 74 | Temp 97.3°F | Ht 63.0 in | Wt 205.6 lb

## 2024-01-10 DIAGNOSIS — B351 Tinea unguium: Secondary | ICD-10-CM

## 2024-01-10 DIAGNOSIS — G43909 Migraine, unspecified, not intractable, without status migrainosus: Secondary | ICD-10-CM

## 2024-01-10 DIAGNOSIS — Z13220 Encounter for screening for lipoid disorders: Secondary | ICD-10-CM | POA: Diagnosis not present

## 2024-01-10 DIAGNOSIS — R7303 Prediabetes: Secondary | ICD-10-CM | POA: Diagnosis not present

## 2024-01-10 DIAGNOSIS — K580 Irritable bowel syndrome with diarrhea: Secondary | ICD-10-CM | POA: Diagnosis not present

## 2024-01-10 DIAGNOSIS — H5789 Other specified disorders of eye and adnexa: Secondary | ICD-10-CM | POA: Diagnosis not present

## 2024-01-10 DIAGNOSIS — K582 Mixed irritable bowel syndrome: Secondary | ICD-10-CM

## 2024-01-10 DIAGNOSIS — E2839 Other primary ovarian failure: Secondary | ICD-10-CM

## 2024-01-10 DIAGNOSIS — E039 Hypothyroidism, unspecified: Secondary | ICD-10-CM | POA: Diagnosis not present

## 2024-01-10 DIAGNOSIS — J4540 Moderate persistent asthma, uncomplicated: Secondary | ICD-10-CM | POA: Diagnosis not present

## 2024-01-10 DIAGNOSIS — F5101 Primary insomnia: Secondary | ICD-10-CM | POA: Diagnosis not present

## 2024-01-10 DIAGNOSIS — F331 Major depressive disorder, recurrent, moderate: Secondary | ICD-10-CM | POA: Diagnosis not present

## 2024-01-10 MED ORDER — PANTOPRAZOLE SODIUM 40 MG PO TBEC: 40.0000 mg | DELAYED_RELEASE_TABLET | Freq: Two times a day (BID) | ORAL | 3 refills | Status: DC

## 2024-01-10 MED ORDER — LEVOTHYROXINE SODIUM 125 MCG PO TABS: 125.0000 ug | ORAL_TABLET | Freq: Every day | ORAL | 3 refills | Status: AC

## 2024-01-10 MED ORDER — TOPIRAMATE 100 MG PO TABS: 100.0000 mg | ORAL_TABLET | Freq: Two times a day (BID) | ORAL | 3 refills | Status: DC

## 2024-01-10 MED ORDER — BUPROPION HCL ER (XL) 300 MG PO TB24: 300.0000 mg | ORAL_TABLET | Freq: Every day | ORAL | 3 refills | Status: AC

## 2024-01-10 MED ORDER — ZOLPIDEM TARTRATE 10 MG PO TABS: 10.0000 mg | ORAL_TABLET | Freq: Every day | ORAL | 1 refills | Status: DC

## 2024-01-10 MED ORDER — RIZATRIPTAN BENZOATE 10 MG PO TABS: 10.0000 mg | ORAL_TABLET | ORAL | 2 refills | Status: AC | PRN

## 2024-01-10 MED ORDER — DICYCLOMINE HCL 20 MG PO TABS: ORAL_TABLET | ORAL | 2 refills | Status: AC

## 2024-01-10 NOTE — Assessment & Plan Note (Signed)
 Stable on Wellbutrin  300 mg daily.  Will refill today.

## 2024-01-10 NOTE — Assessment & Plan Note (Signed)
 Check TSH with labs.  On Synthroid  125 mcg daily.

## 2024-01-10 NOTE — Assessment & Plan Note (Signed)
 On Topamax  100 mg twice daily.  Uses Maxalt  as needed.  Symptoms are stable.

## 2024-01-10 NOTE — Progress Notes (Signed)
 IYSIS Schneider is a 73 y.o. female who presents today for an office visit.  Assessment/Plan:  New/Acute Problems: Onychomycosis She has not had much improvement with the terbinafine . Will refer to podiatry.   Eye Irritation  No red flags.  This has been persistent for several months.  Her ophthalmologist cannot see her for another 4 months.  Will place referral for her to see a different ophthalmologist in the area.   Chronic Problems Addressed Today: Hypothyroidism Check TSH with labs.  On Synthroid  125 mcg daily.  Prediabetes Check A1c.  MDD (major depressive disorder) Stable on Wellbutrin  300 mg daily.  Will refill today.  Asthma, allergic Follows with allergist.  On Advair and albuterol .  Migraine On Topamax  100 mg twice daily.  Uses Maxalt  as needed.  Symptoms are stable.  Insomnia Stable on Ambien  nightly.  No significant side effects.  Tolerating well.  Will refill today.  IBS (irritable bowel syndrome) Stable on Bentyl  20 mg 4 times daily.  Will refill today.  Preventative Healthcare Check labs.  Ordered bone density scan.  Declined pneumonia vaccine.    Subjective:  HPI:  See assessment / plan for status of chronic conditions.  Patient here today for her annual follow-up.  I last saw her about 4 months ago.  At that time we discussed onychomycosis and started her on terbinafine .  We last saw her for her yearly visit a year ago.  Labs at that time were all stable.  She does not feel like the terbinafine  has helped much. She is still having ongoing issues with eye pain and irritation. Her eye doctor cannot see her until December for this.  This has been going on for several months at this point.  Her vision seems to be stable.  PMH:  The following were reviewed and entered/updated in epic: Past Medical History:  Diagnosis Date   Allergic rhinitis    Arthritis    Asthma    Depression    Goiter    Goiter    Headache(784.0)    Sleep apnea    Patient  Active Problem List   Diagnosis Date Noted   Prediabetes 01/03/2023   GERD (gastroesophageal reflux disease) 07/17/2019   OA (osteoarthritis) of knee s/p TKA 2021 04/13/2019   MDD (major depressive disorder) 09/13/2016   Tinea versicolor 07/06/2016   IBS (irritable bowel syndrome) 01/02/2016   Hypothyroidism 10/17/2015   Migraine 11/27/2012   Insomnia 07/21/2009   Allergies 01/19/2008   Asthma, allergic 01/19/2008   Past Surgical History:  Procedure Laterality Date   ABDOMINAL HYSTERECTOMY     CHOLECYSTECTOMY N/A 11/02/2013   Procedure: LAPAROSCOPIC CHOLECYSTECTOMY ;  Surgeon: Lynwood MALVA Pina, MD;  Location: [facility] OR;  Service: General;  Laterality: N/A;   KNEE SURGERY     THYROIDECTOMY      Family History  Problem Relation Age of Onset   Allergies Mother    Dementia Mother    ALS Father    Prostate cancer Father    Colon cancer Neg Hx    Esophageal cancer Neg Hx    Stomach cancer Neg Hx    Rectal cancer Neg Hx     Medications- reviewed and updated Current Outpatient Medications  Medication Sig Dispense Refill   albuterol  (VENTOLIN  HFA) 108 (90 Base) MCG/ACT inhaler Inhale two puffs every 4-6 hours if needed for cough or wheeze 18 g 2   azelastine  (ASTELIN ) 0.1 % nasal spray Place 2 sprays into both nostrils 2 (two) times daily. Use  in each nostril as directed 90 mL 1   bismuth subsalicylate (PEPTO BISMOL) 262 MG/15ML suspension Take 30 mLs by mouth every 6 (six) hours as needed.     Calcium Carbonate-Vitamin D  600-400 MG-UNIT tablet Take 1 tablet by mouth 2 (two) times daily.     famotidine  (PEPCID ) 40 MG tablet Take 1 tablet (40 mg total) by mouth daily. 90 tablet 1   fluticasone  (FLONASE ) 50 MCG/ACT nasal spray One spray each nostril daily as needed 48 g 1   fluticasone -salmeterol (ADVAIR DISKUS) 500-50 MCG/ACT AEPB Inhale 1 puff into the lungs in the morning and at bedtime. 60 each 5   hydrocortisone  2.5 % ointment Apply topically 2 (two) times daily. 454 g 1   hydrOXYzine   (ATARAX ) 25 MG tablet Use one tablet every 8 hours as needed. 30 tablet 5   ketoconazole  (NIZORAL ) 2 % cream Apply 1 Application topically 2 (two) times daily. 60 g 0   Multiple Vitamins-Minerals (MULTIVITAMIN WITH MINERALS) tablet Take 1 tablet by mouth daily.     buPROPion  (WELLBUTRIN  XL) 300 MG 24 hr tablet Take 1 tablet (300 mg total) by mouth daily. 90 tablet 3   dicyclomine  (BENTYL ) 20 MG tablet TAKE 1 TABLET FOUR TIMES A DAY BEFORE MEALS AND AT BEDTIME 90 tablet 2   levothyroxine  (SYNTHROID ) 125 MCG tablet Take 1 tablet (125 mcg total) by mouth daily. 90 tablet 3   pantoprazole  (PROTONIX ) 40 MG tablet Take 1 tablet (40 mg total) by mouth 2 (two) times daily. 180 tablet 3   rizatriptan  (MAXALT ) 10 MG tablet Take 1 tablet (10 mg total) by mouth as needed for migraine. May repeat in 2 hours if needed 18 tablet 2   topiramate  (TOPAMAX ) 100 MG tablet Take 1 tablet (100 mg total) by mouth 2 (two) times daily. 180 tablet 3   zolpidem  (AMBIEN ) 10 MG tablet Take 1 tablet (10 mg total) by mouth at bedtime. 90 tablet 1   No current facility-administered medications for this visit.    Allergies-reviewed and updated Allergies  Allergen Reactions   Sulfa Antibiotics    Sulfamethoxazole-Trimethoprim Swelling   Metronidazole Itching and Rash    Social History   Socioeconomic History   Marital status: Married    Spouse name: Brooke Schneider   Number of children: 4   Years of education: college   Highest education level: Not on file  Occupational History   Occupation: housewife  Tobacco Use   Smoking status: Never    Passive exposure: Yes   Smokeless tobacco: Never   Tobacco comments:    husband smoking cigars  Vaping Use   Vaping status: Never Used  Substance and Sexual Activity   Alcohol use: Not Currently    Comment: occ   Drug use: Never   Sexual activity: Not Currently  Other Topics Concern   Not on file  Social History Narrative   Patient lives at home with her husband Brooke Schneider).    Retired.   Education college B.S.   Right handed.   Caffeine one cup of coffee daily.   Social Drivers of Corporate investment banker Strain: Low Risk  (01/04/2023)   Overall Financial Resource Strain (CARDIA)    Difficulty of Paying Living Expenses: Not hard at all  Food Insecurity: Low Risk  (03/07/2023)   Received from [facility]   Hunger Vital Sign    Within the past 12 months, you worried that your food would run out before you got money to buy more: Never true  Within the past 12 months, the food you bought just didn't last and you didn't have money to get more. : Never true  Transportation Needs: No Transportation Needs (03/07/2023)   Received from [facility]    In the past 12 months, has lack of reliable transportation kept you from medical appointments, meetings, work or from getting things needed for daily living? : No  Physical Activity: Inactive (01/04/2023)   Exercise Vital Sign    Days of Exercise per Week: 0 days    Minutes of Exercise per Session: 0 min  Stress: Stress Concern Present (01/04/2023)   [employer] of Occupational Health - Occupational Stress Questionnaire    Feeling of Stress : Rather much  Social Connections: Moderately Isolated (01/04/2023)   Social Connection and Isolation Panel    Frequency of Communication with Friends and Family: More than three times a week    Frequency of Social Gatherings with Friends and Family: Twice a week    Attends Religious Services: Never    Database administrator or Organizations: No    Attends Engineer, structural: Never    Marital Status: Married           Objective:  Physical Exam: BP 110/75   Pulse 74   Temp (!) 97.3 F (36.3 C) (Temporal)   Ht 5' 3 (1.6 m)   Wt 205 lb 9.6 oz (93.3 kg)   SpO2 98%   BMI 36.42 kg/m   Gen: No acute distress, resting comfortably CV: Regular rate and rhythm with no murmurs appreciated Pulm: Normal work of breathing, clear to  auscultation bilaterally with no crackles, wheezes, or rhonchi Neuro: Grossly normal, moves all extremities Psych: Normal affect and thought content      Tjuana Vickrey M. Kennyth, MD 01/10/2024 2:58 PM

## 2024-01-10 NOTE — Assessment & Plan Note (Signed)
 Stable on Ambien  nightly.  No significant side effects.  Tolerating well.  Will refill today.

## 2024-01-10 NOTE — Assessment & Plan Note (Signed)
 Check A1c.

## 2024-01-10 NOTE — Assessment & Plan Note (Signed)
Follows with allergist.  On Advair and albuterol.

## 2024-01-10 NOTE — Patient Instructions (Addendum)
 It was very nice to see you today!  I will refer you to see the podiatrist and eye doctor  We will check blood work today.   I will order a bone density scan.  Let us  know if you change your mind about the pneumonia vaccine.  Return in about 1 year (around 01/09/2025) for Annual Physical.   Take care, Dr Kennyth  PLEASE NOTE:  If you had any lab tests, please let us  know if you have not heard back within a few days. You may see your results on mychart before we have a chance to review them but we will give you a call once they are reviewed by us .   If we ordered any referrals today, please let us  know if you have not heard from their office within the next week.   If you had any urgent prescriptions sent in today, please check with the pharmacy within an hour of our visit to make sure the prescription was transmitted appropriately.   Please try these tips to maintain a healthy lifestyle:  Eat at least 3 REAL meals and 1-2 snacks per day.  Aim for no more than 5 hours between eating.  If you eat breakfast, please do so within one hour of getting up.   Each meal should contain half fruits/vegetables, one quarter protein, and one quarter carbs (no bigger than a computer mouse)  Cut down on sweet beverages. This includes juice, soda, and sweet tea.   Drink at least 1 glass of water with each meal and aim for at least 8 glasses per day  Exercise at least 150 minutes every week.

## 2024-01-10 NOTE — Assessment & Plan Note (Signed)
 Stable on Bentyl  20 mg 4 times daily.  Will refill today.

## 2024-01-11 LAB — CBC
HCT: 43.6 % (ref 36.0–46.0)
Hemoglobin: 14.2 g/dL (ref 12.0–15.0)
MCHC: 32.6 g/dL (ref 30.0–36.0)
MCV: 92.9 fl (ref 78.0–100.0)
Platelets: 217 K/uL (ref 150.0–400.0)
RBC: 4.69 Mil/uL (ref 3.87–5.11)
RDW: 13.9 % (ref 11.5–15.5)
WBC: 5 K/uL (ref 4.0–10.5)

## 2024-01-11 LAB — COMPREHENSIVE METABOLIC PANEL WITH GFR
ALT: 13 U/L (ref 0–35)
AST: 15 U/L (ref 0–37)
Albumin: 4.1 g/dL (ref 3.5–5.2)
Alkaline Phosphatase: 53 U/L (ref 39–117)
BUN: 8 mg/dL (ref 6–23)
CO2: 27 meq/L (ref 19–32)
Calcium: 9.5 mg/dL (ref 8.4–10.5)
Chloride: 106 meq/L (ref 96–112)
Creatinine, Ser: 0.85 mg/dL (ref 0.40–1.20)
GFR: 68.04 mL/min (ref >60.00)
Glucose, Bld: 82 mg/dL (ref 70–99)
Potassium: 4.1 meq/L (ref 3.5–5.1)
Sodium: 140 meq/L (ref 135–145)
Total Bilirubin: 0.3 mg/dL (ref 0.2–1.2)
Total Protein: 7.4 g/dL (ref 6.0–8.3)

## 2024-01-11 LAB — LIPID PANEL
Cholesterol: 180 mg/dL (ref 0–200)
HDL: 54.7 mg/dL (ref >39.00)
LDL Cholesterol: 107 mg/dL — ABNORMAL HIGH (ref 0–99)
NonHDL: 125.46
Total CHOL/HDL Ratio: 3
Triglycerides: 93 mg/dL (ref 0.0–149.0)
VLDL: 18.6 mg/dL (ref 0.0–40.0)

## 2024-01-11 LAB — TSH: TSH: 2.69 u[IU]/mL (ref 0.35–5.50)

## 2024-01-11 LAB — HEMOGLOBIN A1C: Hgb A1c MFr Bld: 6.1 % (ref 4.6–6.5)

## 2024-01-12 ENCOUNTER — Ambulatory Visit: Payer: Self-pay | Admitting: Family Medicine

## 2024-01-12 NOTE — Progress Notes (Signed)
 Cholesterol and A1c are borderline but all of her other labs are stable. Do not need to make any changes to her treatment plan at this time. She should continue to work on diet and exercise and we can recheck in a year.

## 2024-02-03 ENCOUNTER — Encounter: Payer: Self-pay | Admitting: Podiatry

## 2024-02-03 ENCOUNTER — Ambulatory Visit (INDEPENDENT_AMBULATORY_CARE_PROVIDER_SITE_OTHER): Admitting: Podiatry

## 2024-02-03 VITALS — Ht 63.0 in | Wt 205.0 lb

## 2024-02-03 DIAGNOSIS — M7662 Achilles tendinitis, left leg: Secondary | ICD-10-CM | POA: Diagnosis not present

## 2024-02-03 DIAGNOSIS — B351 Tinea unguium: Secondary | ICD-10-CM

## 2024-02-03 DIAGNOSIS — M79674 Pain in right toe(s): Secondary | ICD-10-CM | POA: Diagnosis not present

## 2024-02-03 DIAGNOSIS — M79675 Pain in left toe(s): Secondary | ICD-10-CM | POA: Diagnosis not present

## 2024-02-03 NOTE — Progress Notes (Signed)
 Subjective:   Patient ID: Brooke Schneider, female   DOB: 73 y.o.   MRN: [id number]   HPI Patient presents several problems does have nail disease 1-5 both feet that she has had for a long time and they can be bothersome for her and also is noted to have pain in the back of the left heel medial side that is sore when pressed and makes walking difficult.  Patient is noted good digital perfusion well-oriented and does not smoke likes to be active   Review of Systems  All other systems reviewed and are negative.       Objective:  Physical Exam Vitals and nursing note reviewed.  Constitutional:      Appearance: She is well-developed.  Pulmonary:     Effort: Pulmonary effort is normal.  Musculoskeletal:        General: Normal range of motion.  Skin:    General: Skin is warm.  Neurological:     Mental Status: She is alert.     Neurovascular status found to be intact muscle strength found to be adequate range of motion adequate with patient noted to have thick yellow brittle nailbeds 1-5 both feet moderately painful and inability for her to cut it on the posterior left heel medial side there is discomfort no indications of tendon dysfunction or weakening     Assessment:  Mycotic nail infection with discomfort 1-5 both feet with Achilles tendinitis medial side left     Plan:  H&P both conditions reviewed discussed.  For the Achilles she is due for physical therapy for her knee and I have recommended they work on her Achilles and that she wear shoes with lifts and at this point I debrided nailbeds 1-5 both feet no iatrogenic bleeding reappoint routine care

## 2024-02-10 DIAGNOSIS — Z96651 Presence of right artificial knee joint: Secondary | ICD-10-CM | POA: Diagnosis not present

## 2024-02-10 DIAGNOSIS — M25661 Stiffness of right knee, not elsewhere classified: Secondary | ICD-10-CM | POA: Diagnosis not present

## 2024-02-10 DIAGNOSIS — M25461 Effusion, right knee: Secondary | ICD-10-CM | POA: Diagnosis not present

## 2024-02-13 ENCOUNTER — Other Ambulatory Visit: Payer: Self-pay | Admitting: Allergy & Immunology

## 2024-02-14 DIAGNOSIS — Z96651 Presence of right artificial knee joint: Secondary | ICD-10-CM | POA: Diagnosis not present

## 2024-02-14 DIAGNOSIS — M25461 Effusion, right knee: Secondary | ICD-10-CM | POA: Diagnosis not present

## 2024-02-14 DIAGNOSIS — M25661 Stiffness of right knee, not elsewhere classified: Secondary | ICD-10-CM | POA: Diagnosis not present

## 2024-02-17 ENCOUNTER — Other Ambulatory Visit: Payer: Self-pay | Admitting: *Deleted

## 2024-02-17 ENCOUNTER — Telehealth: Payer: Self-pay | Admitting: *Deleted

## 2024-02-17 DIAGNOSIS — Z96651 Presence of right artificial knee joint: Secondary | ICD-10-CM | POA: Diagnosis not present

## 2024-02-17 DIAGNOSIS — M25461 Effusion, right knee: Secondary | ICD-10-CM | POA: Diagnosis not present

## 2024-02-17 DIAGNOSIS — M25661 Stiffness of right knee, not elsewhere classified: Secondary | ICD-10-CM | POA: Diagnosis not present

## 2024-02-17 NOTE — Telephone Encounter (Signed)
 Copied from CRM #[id number]. Topic: Referral - Request for Referral >> Feb 17, 2024 10:39 AM Mercedes MATSU wrote: Did the patient discuss referral with their provider in the last year? Yes (If No - schedule appointment) (If Yes - send message)  Appointment offered? Yes  Type of order/referral and detailed reason for visit: patient called in asking for a referral for her ortho that she is already seeing currently, she said in order for them to deal with her akelis heel and her ankle area.  Preference of office, provider, location:  [facility] Address: [address] Brooke [place] [address] Phone: [phone] Fax: [phone]  If referral order, have you been seen by this specialty before? Yes (If Yes, this issue or another issue? When? Where?  Can we respond through MyChart? No. [phone] patient requesting call back, please leave message if she does not answer she just wants to know once the referral has been sent.    Ok to send referral? [facility]

## 2024-02-17 NOTE — Telephone Encounter (Signed)
 Ok with me. Please place any necessary orders.

## 2024-02-20 ENCOUNTER — Other Ambulatory Visit: Payer: Self-pay | Admitting: *Deleted

## 2024-02-20 DIAGNOSIS — M25661 Stiffness of right knee, not elsewhere classified: Secondary | ICD-10-CM | POA: Diagnosis not present

## 2024-02-20 DIAGNOSIS — M79671 Pain in right foot: Secondary | ICD-10-CM

## 2024-02-20 DIAGNOSIS — M25461 Effusion, right knee: Secondary | ICD-10-CM | POA: Diagnosis not present

## 2024-02-20 DIAGNOSIS — Z96651 Presence of right artificial knee joint: Secondary | ICD-10-CM | POA: Diagnosis not present

## 2024-02-20 NOTE — Telephone Encounter (Signed)
Ortho Referral placed

## 2024-02-22 DIAGNOSIS — M25461 Effusion, right knee: Secondary | ICD-10-CM | POA: Diagnosis not present

## 2024-02-22 DIAGNOSIS — Z96651 Presence of right artificial knee joint: Secondary | ICD-10-CM | POA: Diagnosis not present

## 2024-02-22 DIAGNOSIS — M25661 Stiffness of right knee, not elsewhere classified: Secondary | ICD-10-CM | POA: Diagnosis not present

## 2024-02-27 NOTE — Telephone Encounter (Unsigned)
 Copied from CRM #[id number]. Topic: Referral - Request for Referral >> Feb 27, 2024  2:33 PM Jasmin G wrote: Did the patient discuss referral with their provider in the last year? Yes (If No - schedule appointment) (If Yes - send message)  Appointment offered? No  Type of order/referral and detailed reason for visit: Physical Therapy for ankle and foot  Preference of office, provider, location: [facility], [phone]  If referral order, have you been seen by this specialty before? No (If Yes, this issue or another issue? When? Where?  Can we respond through MyChart? No

## 2024-02-29 NOTE — Telephone Encounter (Signed)
 Ok with me. Please place any necessary orders.

## 2024-02-29 NOTE — Telephone Encounter (Signed)
 Ok to send referral

## 2024-03-01 ENCOUNTER — Telehealth: Payer: Self-pay | Admitting: *Deleted

## 2024-03-01 ENCOUNTER — Other Ambulatory Visit: Payer: Self-pay | Admitting: *Deleted

## 2024-03-01 DIAGNOSIS — M25579 Pain in unspecified ankle and joints of unspecified foot: Secondary | ICD-10-CM

## 2024-03-01 NOTE — Telephone Encounter (Signed)
 SABRA

## 2024-03-01 NOTE — Telephone Encounter (Signed)
 Referral PT placed

## 2024-03-02 DIAGNOSIS — M25461 Effusion, right knee: Secondary | ICD-10-CM | POA: Diagnosis not present

## 2024-03-02 DIAGNOSIS — Z96651 Presence of right artificial knee joint: Secondary | ICD-10-CM | POA: Diagnosis not present

## 2024-03-02 DIAGNOSIS — M25661 Stiffness of right knee, not elsewhere classified: Secondary | ICD-10-CM | POA: Diagnosis not present

## 2024-03-07 ENCOUNTER — Ambulatory Visit

## 2024-03-07 DIAGNOSIS — M25461 Effusion, right knee: Secondary | ICD-10-CM | POA: Diagnosis not present

## 2024-03-07 DIAGNOSIS — Z96651 Presence of right artificial knee joint: Secondary | ICD-10-CM | POA: Diagnosis not present

## 2024-03-07 DIAGNOSIS — M25661 Stiffness of right knee, not elsewhere classified: Secondary | ICD-10-CM | POA: Diagnosis not present

## 2024-03-09 DIAGNOSIS — M25461 Effusion, right knee: Secondary | ICD-10-CM | POA: Diagnosis not present

## 2024-03-09 DIAGNOSIS — Z96651 Presence of right artificial knee joint: Secondary | ICD-10-CM | POA: Diagnosis not present

## 2024-03-09 DIAGNOSIS — M25661 Stiffness of right knee, not elsewhere classified: Secondary | ICD-10-CM | POA: Diagnosis not present

## 2024-03-13 DIAGNOSIS — M25461 Effusion, right knee: Secondary | ICD-10-CM | POA: Diagnosis not present

## 2024-03-13 DIAGNOSIS — Z96651 Presence of right artificial knee joint: Secondary | ICD-10-CM | POA: Diagnosis not present

## 2024-03-13 DIAGNOSIS — M25661 Stiffness of right knee, not elsewhere classified: Secondary | ICD-10-CM | POA: Diagnosis not present

## 2024-03-16 ENCOUNTER — Ambulatory Visit: Admitting: Podiatry

## 2024-03-16 ENCOUNTER — Encounter: Payer: Self-pay | Admitting: Podiatry

## 2024-03-16 DIAGNOSIS — M7662 Achilles tendinitis, left leg: Secondary | ICD-10-CM | POA: Diagnosis not present

## 2024-03-16 MED ORDER — TRIAMCINOLONE ACETONIDE 10 MG/ML IJ SUSP
10.0000 mg | Freq: Once | INTRAMUSCULAR | Status: AC
  Administered 2024-03-16: 10 mg via INTRA_ARTICULAR

## 2024-03-16 NOTE — Patient Instructions (Signed)

## 2024-03-19 NOTE — Progress Notes (Signed)
 Subjective:   Patient ID: Brooke Schneider, female   DOB: 73 y.o.   MRN: [id number]   HPI With a lot of pain in the left Achilles tendon states has been hurting making it hard to wear and walk comfortably   ROS      Objective:  Physical Exam  Neurovascular status intact inflammation pain lateral side left Achilles at the insertion calcaneus with fluid buildup     Assessment:  Acute Achilles tendinitis left     Plan:  H&P reviewed I have recommended stretching exercises and I went ahead today and recommended injection explaining risk of rupture she wants to go through with this understanding risk and today I did sterile prep I then carefully injected lateral side 3 mg dexamethasone  Kenalog 5 mg Xylocaine  and applied sterile dressing.  Reappoint and will be careful in the next few days     Patient presents

## 2024-03-20 ENCOUNTER — Ambulatory Visit

## 2024-03-20 DIAGNOSIS — Z96651 Presence of right artificial knee joint: Secondary | ICD-10-CM | POA: Diagnosis not present

## 2024-03-20 DIAGNOSIS — M25661 Stiffness of right knee, not elsewhere classified: Secondary | ICD-10-CM | POA: Diagnosis not present

## 2024-03-20 DIAGNOSIS — M25461 Effusion, right knee: Secondary | ICD-10-CM | POA: Diagnosis not present

## 2024-03-21 ENCOUNTER — Ambulatory Visit

## 2024-03-23 DIAGNOSIS — M25661 Stiffness of right knee, not elsewhere classified: Secondary | ICD-10-CM | POA: Diagnosis not present

## 2024-03-23 DIAGNOSIS — Z96651 Presence of right artificial knee joint: Secondary | ICD-10-CM | POA: Diagnosis not present

## 2024-03-23 DIAGNOSIS — M25461 Effusion, right knee: Secondary | ICD-10-CM | POA: Diagnosis not present

## 2024-03-28 DIAGNOSIS — Z96651 Presence of right artificial knee joint: Secondary | ICD-10-CM | POA: Diagnosis not present

## 2024-03-28 DIAGNOSIS — M25661 Stiffness of right knee, not elsewhere classified: Secondary | ICD-10-CM | POA: Diagnosis not present

## 2024-03-28 DIAGNOSIS — M25461 Effusion, right knee: Secondary | ICD-10-CM | POA: Diagnosis not present

## 2024-04-09 ENCOUNTER — Ambulatory Visit

## 2024-04-09 VITALS — BP 138/78 | HR 86 | Temp 97.3°F | Ht 65.0 in | Wt 202.6 lb

## 2024-04-09 DIAGNOSIS — Z0000 Encounter for general adult medical examination without abnormal findings: Secondary | ICD-10-CM

## 2024-04-09 NOTE — Patient Instructions (Signed)
 Brooke Schneider,  Thank you for taking the time for your Medicare Wellness Visit. I appreciate your continued commitment to your health goals. Please review the care plan we discussed, and feel free to reach out if I can assist you further.  Please note that Annual Wellness Visits do not include a physical exam. Some assessments may be limited, especially if the visit was conducted virtually. If needed, we may recommend an in-person follow-up with your provider.  Ongoing Care Seeing your primary care provider every 3 to 6 months helps us  monitor your health and provide consistent, personalized care.   Referrals If a referral was made during today's visit and you haven't received any updates within two weeks, please contact the referred provider directly to check on the status.  Recommended Screenings:  Health Maintenance  Topic Date Due   DEXA scan (bone density measurement)  Never done   Breast Cancer Screening  06/16/2022   COVID-19 Vaccine (5 - 2025-26 season) 02/06/2024   Pneumococcal Vaccine for age over 19 (3 of 3 - PCV20 or PCV21) 04/14/2024*   Flu Shot  09/04/2024*   Medicare Annual Wellness Visit  04/09/2025   Colon Cancer Screening  08/06/2031   DTaP/Tdap/Td vaccine (4 - Td or Tdap) 09/13/2032   Hepatitis C Screening  Completed   Zoster (Shingles) Vaccine  Completed   Meningitis B Vaccine  Aged Out  *Topic was postponed. The date shown is not the original due date.       01/04/2023    2:16 PM  Advanced Directives  Does Patient Have a Medical Advance Directive? No  Would patient like information on creating a medical advance directive? Yes (MAU/Ambulatory/Procedural Areas - Information given)    Vision: Annual vision screenings are recommended for early detection of glaucoma, cataracts, and diabetic retinopathy. These exams can also reveal signs of chronic conditions such as diabetes and high blood pressure.  Dental: Annual dental screenings help detect early signs of  oral cancer, gum disease, and other conditions linked to overall health, including heart disease and diabetes.  Please see the attached documents for additional preventive care recommendations.

## 2024-04-09 NOTE — Progress Notes (Addendum)
 Subjective:   Brooke Schneider is a 73 y.o. female who presents for a Medicare Annual Wellness Visit.  Allergies (verified) Sulfa antibiotics, Sulfamethoxazole-trimethoprim, and Metronidazole   History: Past Medical History:  Diagnosis Date   Allergic rhinitis    Arthritis    Asthma    Depression    Goiter    Goiter    Headache(784.0)    Sleep apnea    Past Surgical History:  Procedure Laterality Date   ABDOMINAL HYSTERECTOMY     CHOLECYSTECTOMY N/A 11/02/2013   Procedure: LAPAROSCOPIC CHOLECYSTECTOMY ;  Surgeon: Lynwood MALVA Pina, MD;  Location: [facility] OR;  Service: General;  Laterality: N/A;   KNEE SURGERY     THYROIDECTOMY     Family History  Problem Relation Age of Onset   Allergies Mother    Dementia Mother    ALS Father    Prostate cancer Father    Colon cancer Neg Hx    Esophageal cancer Neg Hx    Stomach cancer Neg Hx    Rectal cancer Neg Hx    Social History   Occupational History   Occupation: housewife  Tobacco Use   Smoking status: Never    Passive exposure: Yes   Smokeless tobacco: Never   Tobacco comments:    husband smoking cigars  Vaping Use   Vaping status: Never Used  Substance and Sexual Activity   Alcohol use: Not Currently    Comment: occ   Drug use: Never   Sexual activity: Not Currently   Tobacco Counseling Counseling given: Not Answered Tobacco comments: husband smoking cigars  SDOH Screenings   Food Insecurity: No Food Insecurity (04/09/2024)  Housing: Unknown (04/09/2024)  Transportation Needs: No Transportation Needs (04/09/2024)  Utilities: Not At Risk (04/09/2024)  Alcohol Screen: Low Risk  (07/17/2019)  Depression (PHQ2-9): High Risk (04/09/2024)  Financial Resource Strain: Low Risk  (01/04/2023)  Physical Activity: Inactive (04/09/2024)  Social Connections: Moderately Isolated (04/09/2024)  Stress: Stress Concern Present (04/09/2024)  Tobacco Use: Medium Risk (04/09/2024)  Health Literacy: Adequate Health Literacy (04/09/2024)    Depression Screen    04/09/2024   12:24 PM 09/29/2023   12:58 PM 09/29/2023   12:53 PM 03/03/2023    8:42 AM 02/04/2023   11:05 AM 01/04/2023    2:09 PM 12/29/2022    8:56 AM  PHQ 2/9 Scores  PHQ - 2 Score 3 0 0 0 0 2 4  PHQ- 9 Score 14 0  0 0 7 12     Goals Addressed               This Visit's Progress     [facility] (pt-stated)        [facility] the [facility]        Visit info / Clinical Intake: Medicare Wellness Visit Type:: Subsequent Annual Wellness Visit Medicare Wellness Visit Mode:: In-person (required for WTM) Interpreter Needed?: No Pre-visit prep was completed: yes AWV questionnaire completed by patient prior to visit?: no Living arrangements:: lives with spouse/significant other Patient's Overall Health Status Rating: good Typical amount of pain: none Does pain affect daily life?: (!) yes (left ankle at times) Are you currently prescribed opioids?: no  Dietary Habits and Nutritional Risks How many meals a day?: (!) 1 (will skip meals most of the time) Eats fruit and vegetables daily?: yes Most meals are obtained by: preparing own meals Diabetic:: no  Functional Status Activities of Daily Living (to include ambulation/medication): Independent Ambulation: Independent with device- listed below Home Assistive Devices/Equipment:  Eyeglasses Medication Administration: Independent Home Management: Independent Manage your own finances?: (!) no (husband) Primary transportation is: driving Concerns about vision?: (!) yes (feels like something in left eye at times) Concerns about hearing?: no  Fall Screening Falls in the past year?: 0 Number of falls in past year: 0 Was there an injury with Fall?: 0 Fall Risk Category Calculator: 0 Patient Fall Risk Level: Low Fall Risk  Fall Risk Patient at Risk for Falls Due to: Impaired mobility Fall risk Follow up: Falls prevention discussed  Home and Transportation Safety: All rugs have non-skid backing?: yes Grab bars in the  bathtub or shower?: (!) no (has a shower chair) Have non-skid surface in bathtub or shower?: yes Good home lighting?: yes Regular seat belt use?: yes Hospital stays in the last year:: no  Cognitive Assessment Difficulty concentrating, remembering, or making decisions? : yes (memeory at times) Will 6CIT or Mini Cog be Completed: yes What year is it?: 0 points What month is it?: 0 points Give patient an address phrase to remember (5 components): [address] [place]  About what time is it?: 0 points Count backwards from 20 to 1: 0 points Say the months of the year in reverse: 0 points Repeat the address phrase from earlier: 0 points 6 CIT Score: 0 points  Advance Directives (For Healthcare) Does Patient Have a Medical Advance Directive?: No Would patient like information on creating a medical advance directive?: No - Patient declined  Reviewed/Updated  Reviewed/Updated: All        Objective:    Today's Vitals   04/09/24 1205  BP: 138/78  Pulse: 86  Temp: (!) 97.3 F (36.3 C)  SpO2: 97%  Weight: 202 lb 9.6 oz (91.9 kg)  Height: 5' 5 (1.651 m)   Body mass index is 33.71 kg/m.  Current Medications (verified) Outpatient Encounter Medications as of 04/09/2024  Medication Sig   albuterol  (VENTOLIN  HFA) 108 (90 Base) MCG/ACT inhaler Inhale two puffs every 4-6 hours if needed for cough or wheeze   azelastine  (ASTELIN ) 0.1 % nasal spray Place 2 sprays into both nostrils 2 (two) times daily. Use in each nostril as directed   bismuth subsalicylate (PEPTO BISMOL) 262 MG/15ML suspension Take 30 mLs by mouth every 6 (six) hours as needed.   buPROPion  (WELLBUTRIN  XL) 300 MG 24 hr tablet Take 1 tablet (300 mg total) by mouth daily.   Calcium Carbonate-Vitamin D  600-400 MG-UNIT tablet Take 1 tablet by mouth 2 (two) times daily.   dicyclomine  (BENTYL ) 20 MG tablet TAKE 1 TABLET FOUR TIMES A DAY BEFORE MEALS AND AT BEDTIME   famotidine  (PEPCID ) 40 MG tablet Take 1 tablet (40 mg total)  by mouth daily.   fluticasone  (FLONASE ) 50 MCG/ACT nasal spray USE 1 SPRAY INTO EACH NOSTRIL EVERY DAY AS NEEDED   hydrocortisone  2.5 % ointment Apply topically 2 (two) times daily.   hydrOXYzine  (ATARAX ) 25 MG tablet Use one tablet every 8 hours as needed.   levothyroxine  (SYNTHROID ) 125 MCG tablet Take 1 tablet (125 mcg total) by mouth daily.   Multiple Vitamins-Minerals (MULTIVITAMIN WITH MINERALS) tablet Take 1 tablet by mouth daily.   pantoprazole  (PROTONIX ) 40 MG tablet Take 1 tablet (40 mg total) by mouth 2 (two) times daily.   rizatriptan  (MAXALT ) 10 MG tablet Take 1 tablet (10 mg total) by mouth as needed for migraine. May repeat in 2 hours if needed   topiramate  (TOPAMAX ) 100 MG tablet Take 1 tablet (100 mg total) by mouth 2 (two) times  daily.   zolpidem  (AMBIEN ) 10 MG tablet Take 1 tablet (10 mg total) by mouth at bedtime.   fluticasone -salmeterol (ADVAIR DISKUS) 500-50 MCG/ACT AEPB Inhale 1 puff into the lungs in the morning and at bedtime. (Patient not taking: Reported on 04/09/2024)   [DISCONTINUED] ketoconazole  (NIZORAL ) 2 % cream Apply 1 Application topically 2 (two) times daily. (Patient not taking: Reported on 04/09/2024)   No facility-administered encounter medications on file as of 04/09/2024.   Hearing/Vision screen Hearing Screening - Comments:: Pt denies any hearing issues  Vision Screening - Comments:: Wears rx glasses - not up to date with routine eye exams with Dr Octavia  Immunizations and Health Maintenance Health Maintenance  Topic Date Due   DEXA SCAN  Never done   Mammogram  06/16/2022   COVID-19 Vaccine (5 - 2025-26 season) 02/06/2024   Pneumococcal Vaccine: 50+ Years (3 of 3 - PCV20 or PCV21) 04/14/2024 (Originally 06/14/2021)   Influenza Vaccine  09/04/2024 (Originally 01/06/2024)   Medicare Annual Wellness (AWV)  04/09/2025   Colonoscopy  08/06/2031   DTaP/Tdap/Td (4 - Td or Tdap) 09/13/2032   Hepatitis C Screening  Completed   Zoster Vaccines- Shingrix   Completed   Meningococcal B Vaccine  Aged Out        Assessment/Plan:  This is a routine wellness examination for [place].  Patient Care Team: Kennyth Worth HERO, MD as PCP - General (Family Medicine) Gladis Inocente Coy, NP as Nurse Practitioner (Family Medicine) Maurilio, Camellia PARAS, MD as Consulting Physician (Allergy and Immunology) Curlene Agent, MD as Consulting Physician (Obstetrics and Gynecology)  I have personally reviewed and noted the following in the patient's chart:   Medical and social history Use of alcohol, tobacco or illicit drugs  Current medications and supplements including opioid prescriptions. Functional ability and status Nutritional status Physical activity Advanced directives List of other physicians Hospitalizations, surgeries, and ER visits in previous 12 months Vitals Screenings to include cognitive, depression, and falls Referrals and appointments  No orders of the defined types were placed in this encounter.  In addition, I have reviewed and discussed with patient certain preventive protocols, quality metrics, and best practice recommendations. A written personalized care plan for preventive services as well as general preventive health recommendations were provided to patient.   Ellouise VEAR Haws, LPN   [id number]/11/[id number]   Return in 1 year (on 04/09/2025).  After Visit Summary: (MyChart) Due to this being a telephonic visit, the after visit summary with patients personalized plan was offered to patient via MyChart   Nurse Notes: nothing significant at this time

## 2024-04-10 ENCOUNTER — Other Ambulatory Visit: Payer: Self-pay | Admitting: Family Medicine

## 2024-04-17 ENCOUNTER — Other Ambulatory Visit: Payer: Self-pay | Admitting: Family Medicine

## 2024-04-24 DIAGNOSIS — M25461 Effusion, right knee: Secondary | ICD-10-CM | POA: Diagnosis not present

## 2024-04-24 DIAGNOSIS — Z96651 Presence of right artificial knee joint: Secondary | ICD-10-CM | POA: Diagnosis not present

## 2024-04-24 DIAGNOSIS — M25661 Stiffness of right knee, not elsewhere classified: Secondary | ICD-10-CM | POA: Diagnosis not present

## 2024-05-02 ENCOUNTER — Telehealth: Payer: Self-pay

## 2024-05-02 NOTE — Telephone Encounter (Signed)
 Received paperwork via fax from [facility] requesting the Dr. Kennyth review and sign previous treatment plan as well as modifications to treatment plan. Dr. Kennyth reviewed paperwork and handed it back to me. I have faxed papers back to [facility] physical therapy at [phone] and placed in bulk scanning to be added into patients chart.

## 2024-05-09 DIAGNOSIS — M25461 Effusion, right knee: Secondary | ICD-10-CM | POA: Diagnosis not present

## 2024-05-09 DIAGNOSIS — Z96651 Presence of right artificial knee joint: Secondary | ICD-10-CM | POA: Diagnosis not present

## 2024-05-09 DIAGNOSIS — M25661 Stiffness of right knee, not elsewhere classified: Secondary | ICD-10-CM | POA: Diagnosis not present

## 2024-05-15 DIAGNOSIS — Z96651 Presence of right artificial knee joint: Secondary | ICD-10-CM | POA: Diagnosis not present

## 2024-05-15 DIAGNOSIS — M25461 Effusion, right knee: Secondary | ICD-10-CM | POA: Diagnosis not present

## 2024-05-15 DIAGNOSIS — M25661 Stiffness of right knee, not elsewhere classified: Secondary | ICD-10-CM | POA: Diagnosis not present

## 2024-05-17 DIAGNOSIS — M25461 Effusion, right knee: Secondary | ICD-10-CM | POA: Diagnosis not present

## 2024-05-17 DIAGNOSIS — M25661 Stiffness of right knee, not elsewhere classified: Secondary | ICD-10-CM | POA: Diagnosis not present

## 2024-05-17 DIAGNOSIS — Z96651 Presence of right artificial knee joint: Secondary | ICD-10-CM | POA: Diagnosis not present

## 2024-08-29 ENCOUNTER — Other Ambulatory Visit (HOSPITAL_BASED_OUTPATIENT_CLINIC_OR_DEPARTMENT_OTHER)

## 2025-01-17 ENCOUNTER — Encounter: Admitting: Family Medicine

## 2025-04-22 ENCOUNTER — Ambulatory Visit
